# Patient Record
Sex: Male | Born: 1965
Health system: Southern US, Community
[De-identification: ages and names within clinical notes are randomized; demographics above are authoritative.]

## PROBLEM LIST (undated history)

## (undated) DIAGNOSIS — C159 Malignant neoplasm of esophagus, unspecified: Secondary | ICD-10-CM

## (undated) DIAGNOSIS — IMO0001 Reserved for inherently not codable concepts without codable children: Secondary | ICD-10-CM

## (undated) DIAGNOSIS — R7303 Prediabetes: Secondary | ICD-10-CM

## (undated) DIAGNOSIS — Z923 Personal history of irradiation: Secondary | ICD-10-CM

## (undated) DIAGNOSIS — K5790 Diverticulosis of intestine, part unspecified, without perforation or abscess without bleeding: Secondary | ICD-10-CM

## (undated) DIAGNOSIS — G4730 Sleep apnea, unspecified: Secondary | ICD-10-CM

## (undated) DIAGNOSIS — F419 Anxiety disorder, unspecified: Secondary | ICD-10-CM

## (undated) DIAGNOSIS — K219 Gastro-esophageal reflux disease without esophagitis: Secondary | ICD-10-CM

## (undated) DIAGNOSIS — K635 Polyp of colon: Secondary | ICD-10-CM

## (undated) DIAGNOSIS — E785 Hyperlipidemia, unspecified: Secondary | ICD-10-CM

## (undated) HISTORY — PX: OTHER SURGICAL HISTORY: SHX169

## (undated) HISTORY — PX: APPENDECTOMY: SHX54

## (undated) HISTORY — DX: Diverticulosis of intestine, part unspecified, without perforation or abscess without bleeding: K57.90

## (undated) HISTORY — DX: Sleep apnea, unspecified: G47.30

## (undated) HISTORY — PX: LIPOMA EXCISION: SHX5283

## (undated) HISTORY — DX: Polyp of colon: K63.5

## (undated) HISTORY — PX: COLONOSCOPY: SHX174

## (undated) HISTORY — DX: Prediabetes: R73.03

## (undated) HISTORY — DX: Hyperlipidemia, unspecified: E78.5

## (undated) HISTORY — DX: Anxiety disorder, unspecified: F41.9

## (undated) HISTORY — PX: UPPER GASTROINTESTINAL ENDOSCOPY: SHX188

## (undated) HISTORY — DX: Personal history of irradiation: Z92.3

## (undated) SURGERY — EGD (ESOPHAGOGASTRODUODENOSCOPY)
Anesthesia: Moderate Sedation

---

## 2005-04-01 ENCOUNTER — Emergency Department (HOSPITAL_COMMUNITY): Admission: EM | Admit: 2005-04-01 | Discharge: 2005-04-01 | Payer: Self-pay | Admitting: Emergency Medicine

## 2012-07-25 ENCOUNTER — Encounter: Payer: Self-pay | Admitting: Internal Medicine

## 2012-09-02 ENCOUNTER — Ambulatory Visit (INDEPENDENT_AMBULATORY_CARE_PROVIDER_SITE_OTHER): Payer: BC Managed Care – PPO | Admitting: Internal Medicine

## 2012-09-02 ENCOUNTER — Encounter: Payer: Self-pay | Admitting: Internal Medicine

## 2012-09-02 ENCOUNTER — Telehealth: Payer: Self-pay

## 2012-09-02 VITALS — BP 128/92 | HR 80 | Ht 78.0 in | Wt 257.2 lb

## 2012-09-02 DIAGNOSIS — Z800 Family history of malignant neoplasm of digestive organs: Secondary | ICD-10-CM

## 2012-09-02 DIAGNOSIS — E119 Type 2 diabetes mellitus without complications: Secondary | ICD-10-CM

## 2012-09-02 DIAGNOSIS — L290 Pruritus ani: Secondary | ICD-10-CM

## 2012-09-02 MED ORDER — MOVIPREP 100 G PO SOLR: 1.0000 | Freq: Once | ORAL | Status: DC

## 2012-09-02 NOTE — Patient Instructions (Addendum)
You have been scheduled for a colonoscopy with propofol. Please follow written instructions given to you at your visit today.  Please pick up your prep kit at the pharmacy within the next 1-3 days. If you use inhalers (even only as needed), please bring them with you on the day of your procedure.  

## 2012-09-02 NOTE — Progress Notes (Signed)
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 Smaller was diagnosed with colon cancer in her 51s, and subsequently died from the disease. He reports having had a colonoscopy about 5 years ago. Apparently had a polyp. Outside records have been requested for review. His GI review of systems is remarkable for intestinal gas and rectal irritation or itching. Otherwise negative.  REVIEW OF SYSTEMS:  All non-GI ROS negative   Past Medical History  Diagnosis Date  . Hyperlipemia   . Colon polyp     Past Surgical History  Procedure Date  . Appendectomy     Social History Tyrion Glaude  reports that he has never smoked. He has never used smokeless tobacco. He reports that he does not drink alcohol or use illicit drugs.  family history includes Colon cancer in his maternal aunt and mother and Diabetes in his maternal aunt and mother.  No Known Allergies     PHYSICAL EXAMINATION: Vital signs: BP 128/92  Pulse 80  Ht 6\' 6"  (1.981 m)  Wt 257 lb 3.2 oz (116.665 kg)  BMI 29.72 kg/m2  Constitutional: generally well-appearing, no acute distress Psychiatric: alert and oriented x3, cooperative Eyes: extraocular movements intact, anicteric, conjunctiva pink Mouth: oral pharynx moist, no lesions Neck: supple no lymphadenopathy Cardiovascular: heart regular rate and rhythm, no murmur Lungs: clear to auscultation bilaterally Abdomen: soft, nontender, nondistended, no obvious ascites, no peritoneal signs, normal bowel sounds, no organomegaly Rectal:deferred until colonoscopy Extremities: no lower extremity edema bilaterally Skin: no lesions on visible extremities Neuro: No focal deficits.   ASSESSMENT:  #1. Family history of colon cancer in first-degree relative (mom) less than age 84 #2. Prior colonoscopy about 5 years ago. Trying to obtain records for review #3. Perirectal itching. Evaluate at  colonoscopy   PLAN:  #1. High-risk screening colonoscopy.The nature of the procedure, as well as the risks, benefits, and alternatives were carefully and thoroughly reviewed with the patient. Ample time for discussion and questions allowed. The patient understood, was satisfied, and agreed to proceed. Movi prep prescribed. The patient instructed on its use. Hold metformin the morning of this procedure until he resumes normal oral intake

## 2012-09-02 NOTE — Telephone Encounter (Signed)
Left message for patient to call me back. 

## 2012-09-06 ENCOUNTER — Telehealth: Payer: Self-pay

## 2012-09-06 NOTE — Telephone Encounter (Signed)
Lmovm on cell; No answer no VM on home phone.

## 2012-09-17 ENCOUNTER — Ambulatory Visit (AMBULATORY_SURGERY_CENTER): Payer: BC Managed Care – PPO | Admitting: Internal Medicine

## 2012-09-17 ENCOUNTER — Encounter: Payer: Self-pay | Admitting: Internal Medicine

## 2012-09-17 VITALS — BP 123/81 | HR 67 | Temp 98.1°F | Resp 25 | Ht 78.0 in | Wt 257.0 lb

## 2012-09-17 DIAGNOSIS — Z800 Family history of malignant neoplasm of digestive organs: Secondary | ICD-10-CM

## 2012-09-17 DIAGNOSIS — D126 Benign neoplasm of colon, unspecified: Secondary | ICD-10-CM

## 2012-09-17 DIAGNOSIS — Z1211 Encounter for screening for malignant neoplasm of colon: Secondary | ICD-10-CM

## 2012-09-17 MED ORDER — SODIUM CHLORIDE 0.9 % IV SOLN: 500.0000 mL | INTRAVENOUS | Status: DC

## 2012-09-17 NOTE — Patient Instructions (Addendum)
YOU HAD AN ENDOSCOPIC PROCEDURE TODAY AT THE [facility]: Refer to the procedure report that was given to you for any specific questions about what was found during the examination.  If the procedure report does not answer your questions, please call your gastroenterologist to clarify.  If you requested that your care partner not be given the details of your procedure findings, then the procedure report has been included in a sealed envelope for you to review at your convenience later.  YOU SHOULD EXPECT: Some feelings of bloating in the abdomen. Passage of more gas than usual.  Walking can help get rid of the air that was put into your GI tract during the procedure and reduce the bloating. If you had a lower endoscopy (such as a colonoscopy or flexible sigmoidoscopy) you may notice spotting of blood in your stool or on the toilet paper. If you underwent a bowel prep for your procedure, then you may not have a normal bowel movement for a few days.  DIET: Your first meal following the procedure should be a light meal and then it is ok to progress to your normal diet.  A half-sandwich or bowl of soup is an example of a good first meal.  Heavy or fried foods are harder to digest and may make you feel nauseous or bloated.  Likewise meals heavy in dairy and vegetables can cause extra gas to form and this can also increase the bloating.  Drink plenty of fluids but you should avoid alcoholic beverages for 24 hours.  ACTIVITY: Your care partner should take you home directly after the procedure.  You should plan to take it easy, moving slowly for the rest of the day.  You can resume normal activity the day after the procedure however you should NOT DRIVE or use heavy machinery for 24 hours (because of the sedation medicines used during the test).    SYMPTOMS TO REPORT IMMEDIATELY: A gastroenterologist can be reached at any hour.  During normal business hours, 8:30 AM to 5:00 PM Monday through Friday,  call [phone].  After hours and on weekends, please call the GI answering service at [phone] who will take a message and have the physician on call contact you.   Following lower endoscopy (colonoscopy or flexible sigmoidoscopy):  Excessive amounts of blood in the stool  Significant tenderness or worsening of abdominal pains  Swelling of the abdomen that is new, acute  Fever of 100F or higher  FOLLOW UP: If any biopsies were taken you will be contacted by phone or by letter within the next 1-3 weeks.  Call your gastroenterologist if you have not heard about the biopsies in 3 weeks.  Our staff will call the home number listed on your records the next business day following your procedure to check on you and address any questions or concerns that you may have at that time regarding the information given to you following your procedure. This is a courtesy call and so if there is no answer at the home number and we have not heard from you through the emergency physician on call, we will assume that you have returned to your regular daily activities without incident.  SIGNATURES/CONFIDENTIALITY: You and/or your care partner have signed paperwork which will be entered into your electronic medical record.  These signatures attest to the fact that that the information above on your After Visit Summary has been reviewed and is understood.  Full responsibility of the confidentiality of this   discharge information lies with you and/or your care-partner.   Thank-you for choosing us for your healthcare needs. 

## 2012-09-17 NOTE — Progress Notes (Addendum)
Patient did not have preoperative order for IV antibiotic SSI prophylaxis. (G8918)  Patient did not experience any of the following events: a burn prior to discharge; a fall within the facility; wrong site/side/patient/procedure/implant event; or a hospital transfer or hospital admission upon discharge from the facility. (G8907)  

## 2012-09-17 NOTE — Op Note (Signed)
[facility] [address] [place], [address]   COLONOSCOPY PROCEDURE REPORT  PATIENT: Joel Osborne, Joel Osborne  MR#: [id number] BIRTHDATE: 04-10-1966 , 45  yrs. old GENDER: Male ENDOSCOPIST: Roxy Cedar, MD REFERRED XB:JYNWGN Eloise Harman, M.D. PROCEDURE DATE:  09/17/2012 PROCEDURE:   Colonoscopy with snare polypectomy    x 1 ASA CLASS:   Class II INDICATIONS:patient's immediate family history of colon cancer (Mom 50's); Reports colonoscopy 5 yrs ago in [place] (unable to locate records) possibly with polyp. MEDICATIONS: MAC sedation, administered by CRNA and propofol (Diprivan) 200mg  IV  DESCRIPTION OF PROCEDURE:   After the risks benefits and alternatives of the procedure were thoroughly explained, informed consent was obtained.  A digital rectal exam revealed no abnormalities of the rectum.   The LB CF-H180AL [id number]  endoscope was introduced through the anus and advanced to the cecum, which was identified by both the appendix and ileocecal valve. No adverse events experienced.   The quality of the prep was adequate, using MoviPrep  The instrument was then slowly withdrawn as the colon was fully examined.      COLON FINDINGS: A diminutive polyp was found in the ascending colon. A polypectomy was performed with a cold snare.  The resection was complete and the polyp tissue was completely retrieved.   The colon was otherwise normal.  There was no diverticulosis, inflammation, other polyps or cancers .  Retroflexed views revealed small internal hemorrhoids. The time to cecum=2 minutes 11 seconds. Withdrawal time=11 minutes 17 seconds.  The scope was withdrawn and the procedure completed. COMPLICATIONS: There were no complications.  ENDOSCOPIC IMPRESSION: 1.   Diminutive polyp was found in the ascending colon; polypectomy was performed with a cold snare 2.   The colon was otherwise normal  RECOMMENDATIONS: 1. Follow up colonoscopy in 5 years (family  history)   eSigned:  Roxy Cedar, MD 09/17/2012 11:06 AM   cc: Jarome Matin, MD and The Patient   PATIENT NAME:  Dandrea, Widdowson MR#: [id number]

## 2012-09-18 ENCOUNTER — Telehealth: Payer: Self-pay

## 2012-09-18 NOTE — Telephone Encounter (Signed)
  Follow up Call-  Call back number 09/17/2012  Post procedure Call Back phone  # [phone]  Permission to leave phone message Yes     Patient questions:  Do you have a fever, pain , or abdominal swelling? no Pain Score  0 *  Have you tolerated food without any problems? yes  Have you been able to return to your normal activities? yes  Do you have any questions about your discharge instructions: Diet   no Medications  no Follow up visit  no  Do you have questions or concerns about your Care? no  Actions: * If pain score is 4 or above: No action needed, pain <4.

## 2012-09-19 ENCOUNTER — Telehealth: Payer: Self-pay

## 2012-09-19 NOTE — Telephone Encounter (Signed)
Called [facility] to see if pt had been a patient there and had a colon.  Patient only remembers having a procedure somewhere on [address]  Medical records was able to find him in the system but could find no procedure.  They directed me to Dr. Danise Edge at another [place] office.  I called there and was told his chart was offsite.  They are going to obtain it and let me know if it contains a procedure.

## 2012-09-23 ENCOUNTER — Encounter: Payer: Self-pay | Admitting: Internal Medicine

## 2012-09-24 ENCOUNTER — Telehealth: Payer: Self-pay

## 2012-09-24 NOTE — Telephone Encounter (Signed)
Converted to phone note.

## 2012-09-24 NOTE — Telephone Encounter (Signed)
Message copied by Jeanine Luz on Tue Sep 24, 2012  2:43 PM ------      Message from: Hilarie Fredrickson      Created: Tue Sep 24, 2012  2:11 PM      Regarding: RE: old colonoscopy report       Thanks. Convert to phone note for the record.            ----- Message -----         From: Jeanine Luz, CMA         Sent: 09/24/2012   8:55 AM           To: Hilarie Fredrickson, MD      Subject: RE: old colonoscopy report                               I am still trying to track down his procedure.  I spoke with [facility] who had no colon report for him but directed me to [facility].  They saw him in the office and recommended a colon if his mother had a hx of colon cancer but they have no record of any procedure ever being done.                          ----- Message -----         From: Hilarie Fredrickson, MD         Sent: 09/18/2012   1:01 PM           To: Jeanine Luz, CMA      Subject: old colonoscopy report                                   Verlon Au, I saw this patient yesterday in the [facility] for his colonoscopy. He told me that his last colonoscopy was performed in [place] on [address]. It would be call gastroenterology. Call there and get his old colonoscopy report and pathology. Thanks

## 2013-02-27 ENCOUNTER — Other Ambulatory Visit: Payer: Self-pay | Admitting: Internal Medicine

## 2013-02-27 DIAGNOSIS — R1310 Dysphagia, unspecified: Secondary | ICD-10-CM

## 2013-03-06 ENCOUNTER — Telehealth: Payer: Self-pay | Admitting: Internal Medicine

## 2013-03-06 ENCOUNTER — Ambulatory Visit
Admission: RE | Admit: 2013-03-06 | Discharge: 2013-03-06 | Disposition: A | Discharge status: Home / Self Care | Payer: BC Managed Care – PPO | Source: Ambulatory Visit | Attending: Internal Medicine | Admitting: Internal Medicine

## 2013-03-06 ENCOUNTER — Other Ambulatory Visit: Payer: Self-pay | Admitting: Internal Medicine

## 2013-03-06 DIAGNOSIS — R1310 Dysphagia, unspecified: Secondary | ICD-10-CM

## 2013-03-06 NOTE — Telephone Encounter (Signed)
Pt cannot eat and can only get in small amount of liquids. Pt scheduled to see Doug Sou PA tomorrow at 11:15am. Malachi Bonds to notify pt of appt date and time.

## 2013-03-07 ENCOUNTER — Telehealth: Payer: Self-pay | Admitting: *Deleted

## 2013-03-07 ENCOUNTER — Encounter: Payer: Self-pay | Admitting: Internal Medicine

## 2013-03-07 ENCOUNTER — Ambulatory Visit (INDEPENDENT_AMBULATORY_CARE_PROVIDER_SITE_OTHER): Payer: BC Managed Care – PPO | Admitting: Gastroenterology

## 2013-03-07 ENCOUNTER — Encounter: Payer: Self-pay | Admitting: Gastroenterology

## 2013-03-07 VITALS — BP 104/78 | HR 80 | Ht 72.5 in | Wt 235.0 lb

## 2013-03-07 DIAGNOSIS — R1310 Dysphagia, unspecified: Secondary | ICD-10-CM

## 2013-03-07 DIAGNOSIS — R933 Abnormal findings on diagnostic imaging of other parts of digestive tract: Secondary | ICD-10-CM

## 2013-03-07 DIAGNOSIS — R1314 Dysphagia, pharyngoesophageal phase: Secondary | ICD-10-CM

## 2013-03-07 NOTE — Progress Notes (Signed)
03/07/2013 Joel Osborne [id number] 09/02/1966   History of Present Illness: This is a 47 year old male who is a patient of Dr. Lamar Sprinkles.  He presents to our office today with complaints of progressive dysphagia over the past couple of months.  Says that it initially began with some problems with solid food, but sometimes is even having issues getting liquid down at times.  He does not have any significant past history of extensive issues with heartburn/reflux.  Never had problems swallowing in the past.  Has a maternal aunt who had esophageal cancer two years ago.    He saw his PCP who ordered an esophagram (performed yesterday), which showed "Large filling defect in the distal esophagus. Smaller persistent filling defect more proximally in the mid to distal esophagus.  Cannot exclude neoplasm.  Recommend further evaluation with endoscopy."  He said that last night after trying to eat some dinner was the first time that he vomiting to get relief.  No dark or bloody stools.   Current Medications, Allergies, Past Medical History, Past Surgical History, Family History and Social History were reviewed in [employer] record.   Physical Exam: BP 104/78  Pulse 80  Ht 6' 0.5" (1.842 m)  Wt 235 lb (106.595 kg)  BMI 31.42 kg/m2 General: Well developed, male in no acute distress Head: Normocephalic and atraumatic Eyes:  sclerae anicteric, conjunctiva pink  Ears: Normal auditory acuity Lungs: Clear throughout to auscultation Heart: Regular rate and rhythm Abdomen: Soft, non-tender and non-distended. No masses, no hepatomegaly. Normal bowel sounds. Musculoskeletal: Symmetrical with no gross deformities  Extremities: No edema  Neurological: Alert oriented x 4, grossly nonfocal Psychological:  Alert and cooperative. Normal mood and affect  Assessment and Recommendations: -Dysphagia:  Progressive over to two months, initially with solids and now with liquids as  well. -Abnormal esophagram  *Will schedule EGD at the beginning of next week for further evaluation.  The risks, benefits, and alternatives were discussed with the patient and he consents to proceed.

## 2013-03-07 NOTE — Patient Instructions (Addendum)
You have been scheduled for an endoscopy with propofol. Please follow written instructions given to you at your visit today. If you use inhalers (even only as needed), please bring them with you on the day of your procedure. 

## 2013-03-07 NOTE — Telephone Encounter (Signed)
The patient had called in while I was at lunch.  He cancelled the EGD for Mon 03-10-2013 with Dr. Leone Payor due to his wife, attorney at [employer] office.  She has closings on Mon the 31st and cannot come with her husband.  I offered him Wed the Dr. Marina Goodell at 4 PM.  He put me on hold and said that wouldn't do either.  He kept putting me on hold to talk to his wife on the phone.  I urged him to try to take the appt on Wed 4-2.  He said they do not have anyone to pick up the children from school.  He asked about the next week. I told him we had Tues 03-18-2013 at 10:30 .  He said they would take that appointment. I went over the instructions with him.  I advised Doug Sou PA-C of this conversation.

## 2013-03-10 ENCOUNTER — Encounter: Payer: BC Managed Care – PPO | Admitting: Internal Medicine

## 2013-03-12 ENCOUNTER — Encounter: Payer: BC Managed Care – PPO | Admitting: Internal Medicine

## 2013-03-12 NOTE — Progress Notes (Signed)
Noted  

## 2013-03-12 NOTE — Progress Notes (Signed)
Agree with management He was offered an EGD sooner than Dr. Marina Goodell had available but is now scheduled for April 8 w/ Dr. Marina Goodell.  Iva Boop, MD, Clement[facility] Graham

## 2013-03-18 ENCOUNTER — Telehealth: Payer: Self-pay

## 2013-03-18 ENCOUNTER — Encounter: Payer: Self-pay | Admitting: Internal Medicine

## 2013-03-18 ENCOUNTER — Other Ambulatory Visit: Payer: Self-pay | Admitting: Internal Medicine

## 2013-03-18 ENCOUNTER — Ambulatory Visit (AMBULATORY_SURGERY_CENTER): Payer: BC Managed Care – PPO | Admitting: Internal Medicine

## 2013-03-18 VITALS — BP 129/78 | HR 77 | Temp 98.0°F | Resp 18 | Ht 72.0 in | Wt 235.0 lb

## 2013-03-18 DIAGNOSIS — K228 Other specified diseases of esophagus: Secondary | ICD-10-CM

## 2013-03-18 DIAGNOSIS — C160 Malignant neoplasm of cardia: Secondary | ICD-10-CM

## 2013-03-18 DIAGNOSIS — C155 Malignant neoplasm of lower third of esophagus: Secondary | ICD-10-CM

## 2013-03-18 DIAGNOSIS — R933 Abnormal findings on diagnostic imaging of other parts of digestive tract: Secondary | ICD-10-CM

## 2013-03-18 DIAGNOSIS — C159 Malignant neoplasm of esophagus, unspecified: Secondary | ICD-10-CM

## 2013-03-18 DIAGNOSIS — R1314 Dysphagia, pharyngoesophageal phase: Secondary | ICD-10-CM

## 2013-03-18 HISTORY — DX: Malignant neoplasm of esophagus, unspecified: C15.9

## 2013-03-18 HISTORY — PX: OTHER SURGICAL HISTORY: SHX169

## 2013-03-18 MED ORDER — SODIUM CHLORIDE 0.9 % IV SOLN: 500.0000 mL | INTRAVENOUS | Status: DC

## 2013-03-18 NOTE — Patient Instructions (Addendum)
Ct of scan of the chest, abdomen, and pelvis.  Referral to Dr. Truett Perna oncology.  Dr. Lamar Sprinkles 3rd floor nurse will call with appoints and directions.  Continue current medications today.  Please call if any questions or concerns.    YOU HAD AN ENDOSCOPIC PROCEDURE TODAY AT THE [facility]: Refer to the procedure report that was given to you for any specific questions about what was found during the examination.  If the procedure report does not answer your questions, please call your gastroenterologist to clarify.  If you requested that your care partner not be given the details of your procedure findings, then the procedure report has been included in a sealed envelope for you to review at your convenience later.  YOU SHOULD EXPECT: Some feelings of bloating in the abdomen. Passage of more gas than usual.  Walking can help get rid of the air that was put into your GI tract during the procedure and reduce the bloating. If you had a lower endoscopy (such as a colonoscopy or flexible sigmoidoscopy) you may notice spotting of blood in your stool or on the toilet paper. If you underwent a bowel prep for your procedure, then you may not have a normal bowel movement for a few days.  DIET: Your first meal following the procedure should be a light meal and then it is ok to progress to your normal diet.  A half-sandwich or bowl of soup is an example of a good first meal.  Heavy or fried foods are harder to digest and may make you feel nauseous or bloated.  Likewise meals heavy in dairy and vegetables can cause extra gas to form and this can also increase the bloating.  Drink plenty of fluids but you should avoid alcoholic beverages for 24 hours.  ACTIVITY: Your care partner should take you home directly after the procedure.  You should plan to take it easy, moving slowly for the rest of the day.  You can resume normal activity the day after the procedure however you should NOT DRIVE or use heavy  machinery for 24 hours (because of the sedation medicines used during the test).    SYMPTOMS TO REPORT IMMEDIATELY: A gastroenterologist can be reached at any hour.  During normal business hours, 8:30 AM to 5:00 PM Monday through Friday, call [phone].  After hours and on weekends, please call the GI answering service at [phone] who will take a message and have the physician on call contact you.     Following upper endoscopy (EGD)  Vomiting of blood or coffee ground material  New chest pain or pain under the shoulder blades  Painful or persistently difficult swallowing  New shortness of breath  Fever of 100F or higher  Black, tarry-looking stools  FOLLOW UP: If any biopsies were taken you will be contacted by phone or by letter within the next 1-3 weeks.  Call your gastroenterologist if you have not heard about the biopsies in 3 weeks.  Our staff will call the home number listed on your records the next business day following your procedure to check on you and address any questions or concerns that you may have at that time regarding the information given to you following your procedure. This is a courtesy call and so if there is no answer at the home number and we have not heard from you through the emergency physician on call, we will assume that you have returned to your regular daily activities without incident.  SIGNATURES/CONFIDENTIALITY: You and/or your care partner have signed paperwork which will be entered into your electronic medical record.  These signatures attest to the fact that that the information above on your After Visit Summary has been reviewed and is understood.  Full responsibility of the confidentiality of this discharge information lies with you and/or your care-partner.

## 2013-03-18 NOTE — Progress Notes (Signed)
nO COMPLAINTS NOTED IN THE RECOVERY ROOM.  The pt was tearful after speaking with Dr. Marina Goodell about the findings of the exam.  I pulled the curtine and gave the pt and his wife some privacy.  Maw  Patient did not experience any of the following events: a burn prior to discharge; a fall within the facility; wrong site/side/patient/procedure/implant event; or a hospital transfer or hospital admission upon discharge from the facility. (G8907)Patient did not have preoperative order for IV antibiotic SSI prophylaxis. [phone])

## 2013-03-18 NOTE — Progress Notes (Signed)
Called to room to assist during endoscopic procedure.  Patient ID and intended procedure confirmed with present staff. Received instructions for my participation in the procedure from the performing physician. ewm 

## 2013-03-18 NOTE — Telephone Encounter (Signed)
Pt scheduled for CT of CAP with contrast at [facility] CT 03/20/13. Pt to be NPO after 6:30am. Pt to drink 1st bottle of contrast at 8:30am and second bottle at 9:30am. Pt to arrive there at 10:30am. Referral made to [facility] to see Dr. Truett Perna. Pt aware.

## 2013-03-18 NOTE — Op Note (Signed)
[facility] [address] [place], [address]   ENDOSCOPY PROCEDURE REPORT  PATIENT: Joel, Osborne  MR#: [id number] BIRTHDATE: Jul 10, 1966 , 46  yrs. old GENDER: Male ENDOSCOPIST: Roxy Cedar, MD REFERRED BY:  Jarome Matin, M.D. PROCEDURE DATE:  03/18/2013 PROCEDURE:  EGD w/ biopsy ASA CLASS:     Class II INDICATIONS:  Dysphagia.  Abnormal barium swallow MEDICATIONS: MAC sedation, administered by CRNA and propofol (Diprivan) 350mg  IV TOPICAL ANESTHETIC: Cetacaine Spray  DESCRIPTION OF PROCEDURE: After the risks benefits and alternatives of the procedure were thoroughly explained, informed consent was obtained.  The LB GIF-H180 [id number] endoscope was introduced through the mouth and advanced to the second portion of the duodenum. Without limitations.  The instrument was slowly withdrawn as the mucosa was fully examined.      EXAM:The proximal esophagus was normal.  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 This was obstructive.  Multiple biopsies taken.  On retroflex view, the stomach revealed the ulcerative mass in the cardia.  As well, a hiatal hernia.  The remainder of the stomach was normal.  The duodenal bulb and post bulbar duodenum were normal.          The scope was then withdrawn from the patient and the procedure completed.  COMPLICATIONS: There were no complications. ENDOSCOPIC IMPRESSION: 1. 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 Otherwise normal exam.  RECOMMENDATIONS: 1. Schedule contrast-enhanced CT Scan of the chest, abdomen, and pelvis "esophageal mass, rule out metastasis" 2. Followup biopsies 3. Oncology referral, Dr. Truett Perna  REPEAT EXAM:  eSigned:  Roxy Cedar, MD 03/18/2013 11:29 AM   HQ:[employer] Eloise Harman, MD and The Patient

## 2013-03-19 ENCOUNTER — Telehealth: Payer: Self-pay | Admitting: *Deleted

## 2013-03-19 ENCOUNTER — Telehealth: Payer: Self-pay | Admitting: Oncology

## 2013-03-19 NOTE — Telephone Encounter (Signed)
Spoke with patient's wife and confirmed appointment with Dr. Truett Perna for 03/25/13.  Contact names and phone numbers were provided.  She was without any questions.

## 2013-03-19 NOTE — Telephone Encounter (Signed)
Referral to Dr. Truett Perna received.  Will contact patient soon with appointment per Dr. Truett Perna.

## 2013-03-19 NOTE — Telephone Encounter (Signed)
np appt with Dr. Truett Perna 03/25/13@3 :30 Mailed np packet

## 2013-03-19 NOTE — Telephone Encounter (Signed)
No answer, left message with spouse.  She states he is doing well and back at work.  Encouraged her to let him know we called from office and to call if questions or concerns/

## 2013-03-20 ENCOUNTER — Telehealth: Payer: Self-pay

## 2013-03-20 ENCOUNTER — Ambulatory Visit (INDEPENDENT_AMBULATORY_CARE_PROVIDER_SITE_OTHER)
Admission: RE | Admit: 2013-03-20 | Discharge: 2013-03-20 | Disposition: A | Discharge status: Home / Self Care | Payer: BC Managed Care – PPO | Source: Ambulatory Visit | Attending: Internal Medicine | Admitting: Internal Medicine

## 2013-03-20 DIAGNOSIS — K229 Disease of esophagus, unspecified: Secondary | ICD-10-CM

## 2013-03-20 DIAGNOSIS — K228 Other specified diseases of esophagus: Secondary | ICD-10-CM

## 2013-03-20 MED ORDER — IOHEXOL 300 MG/ML  SOLN
100.0000 mL | Freq: Once | INTRAMUSCULAR | Status: AC | PRN
  Administered 2013-03-20: 100 mL via INTRAVENOUS

## 2013-03-20 NOTE — Telephone Encounter (Signed)
Vicie Mutters called and Dr. Truett Perna wanted to know if Dr. Marina Goodell would go ahead and order an EUS for the pts work-up. Let Almira Coaster know that Dr. Marina Goodell is out of the office until Monday. Pts appt is Tuesday with Dr. Truett Perna. Almira Coaster will let Dr. Truett Perna know and he will most likely order the procedure when he sees the pt on Tuesday.

## 2013-03-21 NOTE — Telephone Encounter (Signed)
(  Dan, SEE NOTE BELOW).This is an extensive lesion (at least T3). ? if EUS  findings would change management. I will forward this to Dr. Christella Hartigan for his review. If he feels that EUS would be helpful, them he and Patty can set it up and notify Dr. Earley Abide.

## 2013-03-21 NOTE — Telephone Encounter (Signed)
Brad, I reviewed CT, EGD.  Pretty suspicious for regional adenopathy on CT.  If you think that EUS would be helpful for another TN evaluation, I am happy to.  Let me know.

## 2013-03-25 ENCOUNTER — Encounter: Payer: Self-pay | Admitting: Radiation Oncology

## 2013-03-25 ENCOUNTER — Ambulatory Visit: Payer: BC Managed Care – PPO

## 2013-03-25 ENCOUNTER — Ambulatory Visit (HOSPITAL_BASED_OUTPATIENT_CLINIC_OR_DEPARTMENT_OTHER): Payer: BC Managed Care – PPO | Admitting: Oncology

## 2013-03-25 ENCOUNTER — Other Ambulatory Visit: Payer: Self-pay | Admitting: *Deleted

## 2013-03-25 ENCOUNTER — Encounter: Payer: Self-pay | Admitting: Oncology

## 2013-03-25 VITALS — BP 143/79 | HR 85 | Temp 98.7°F | Resp 18 | Ht 72.0 in | Wt 233.4 lb

## 2013-03-25 DIAGNOSIS — C155 Malignant neoplasm of lower third of esophagus: Secondary | ICD-10-CM

## 2013-03-25 DIAGNOSIS — C159 Malignant neoplasm of esophagus, unspecified: Secondary | ICD-10-CM

## 2013-03-25 NOTE — Progress Notes (Signed)
Request for HER-2/NEU ordered, Accession: [id number] per request of Dr. Truett Perna.  Spoke with [employer].  This RN also spoke with Alycia Rossetti at Dr. Dennie Maizes office and referred to [facility] per request of Dr. Truett Perna.  Tyrone Sage was made aware of patient's case to be presented at 03/26/13 cancer conference.  Referral made to dietician, Vernell Leep.  Patient was given educational information on esophageal cancer along with contact names, phone numbers, and [facility] resources.  Patient informed of PET scan for 03/27/13.  Instructions per [place] in radiology were given to patient.  Patient verbalized comprehension.

## 2013-03-25 NOTE — Progress Notes (Signed)
[facility] New Patient Consult   Referring MD: Jonta Gastineau Petrovic 46 y.o.  Sep 19, 1966    Reason for Referral: Esophagus cancer     HPI: He reports a 3 to four-month history of solid dysphagia. He began an intentional weight loss program at Thanksgiving of last year and has lost greater than 30 pounds. He continues to lose weight, but the pace of weight loss has slowed. He saw Dr. Jarold Motto and an esophagram on 03/06/2013 revealed a large irregular filling defect within the distal esophagus a second smaller persistent filling defect was noted in the mid to distal thoracic esophagus below the level of the carina.  He was referred to Dr. Marina Goodell to evaluate the dysphagia and barium swallow findings. An upper endoscopy on 03/18/2013 revealed a bulky friable mass at the distal 6 cm of the esophagus and involving a gastric cardia. The mass was obstructive. Retroflexed view in the stomach revealed the ulcerative mass in the cardia. The remainder of the stomach was normal. The duodenal bulb and postbulbar duodenum are normal. A biopsy confirmed adenocarcinoma. He was referred for staging CTs of the chest, abdomen, and pelvis on 03/20/2013. A lower right paratracheal node measured 11 mm. A high right paratracheal node measured 8 mm. Small left periaortic nodes were also seen. No evidence of pulmonary metastases. An irregular mass was noted at the distal esophagus. 9 mm lymph node adjacent to the distal esophagus. No significant adenopathy in the gastrohepatic ligament. Small periportal nodes were felt to be benign. A low-density lesion in the inferior right liver measured 15 mm and had peripheral enhancement.  He reports persistent solid dysphagia. He is tolerating liquids.  Past Medical History  Diagnosis Date  . Hyperlipemia   . Colon polyp-tubular adenoma   09/17/2012   . Pre-diabetes     Past Surgical History  Procedure Laterality Date  . Appendectomy    . Lipoma  excision    . Edg  03/18/2013    Esophageal Mass    Family History  Problem Relation Age of Onset  . Colon cancer Mother   . Diabetes Mother   . Breast cancer Maternal Aunt   . Diabetes Maternal Aunt   . Esophageal cancer Maternal Aunt   No other family history of cancer. His brother had a renal transplant  Current outpatient prescriptions:atorvastatin (LIPITOR) 20 MG tablet, , Disp: , Rfl: ;  Canagliflozin (INVOKANA PO), Take 1 tablet by mouth daily., Disp: , Rfl: ;  Multiple Vitamins-Minerals (MULTIVITAMIN WITH MINERALS) tablet, Take 1 tablet by mouth daily., Disp: , Rfl:   Allergies: No Known Allergies  Social History: He is a Land. He lives in [place]. He does not use cigarettes. He reports social alcohol use. No transfusion history. No risk factor for HIV or hepatitis.    History  Alcohol Use  . 1.2 oz/week  . 2 Cans of beer per week    History  Smoking status  . Never Smoker   Smokeless tobacco  . Never Used     ROS:   Positives include: Greater than 30 pound weight loss, anorexia, "snoring "has improved with weight loss, 2 episodes of emesis since the onset of solid dysphagia, coughing after eating  A complete ROS was otherwise negative.  Physical Exam:  Blood pressure 143/79, pulse 85, temperature 98.7 F (37.1 C), temperature source Oral, resp. rate 18, height 6' (1.829 m), weight 233 lb 6.4 oz (105.87 kg).  HEENT: Oropharynx without visible mass, neck without mass  Lungs: Clear bilaterally Cardiac: Regular rate and rhythm Abdomen: No hepatosplenomegaly, nontender, no mass GU: Testes without mass  Vascular: No leg edema Lymph nodes: No cervical, supraclavicular, axillary, or inguinal nodes Neurologic: Alert and oriented. The motor exam appears grossly intact in the upper and lower extremities Skin: No rash   LAB:    Radiology: As per history of present illness. I reviewed the CT scans from 03/20/2013 with Dr.Gruwell and his  wife   Assessment/Plan:   1. Adenocarcinoma of the distal esophagus/gastric cardia-status post an endoscopic biopsy on 03/18/2013 confirming adenocarcinoma -Staging CT scans 03/20/2013 confirmed a distal esophageal/gastric cardia mass, distal paraesophageal lymph nodes and paratracheal nodes concerning for metastatic lymphadenopathy  2. Indeterminate 15 mm right liver lesion on the CT scan 03/20/2013  3. Anorexia/weight loss and solid dysphagia secondary to #1  4. History of a colon polyp, status post a polypectomy October 2013   Disposition:   He has been diagnosed with adenocarcinoma of the gastroesophageal junction. I discussed the diagnosis, prognosis, and treatment options with Dr.Yeates and his wife. I explained the role for multidisciplinary care. He appears to have potentially curable disease based on the staging evaluation to date. He will be referred for a staging PET scan within the next few days. His case will be presented at the GI tumor conference on 03/26/2013. We will ask Dr. Tyrone Sage to decide on the indication for an endoscopic ultrasound. Further imaging of the liver may be required.  If the staging evaluation confirms locally advanced disease then I will recommend neoadjuvant chemotherapy/radiation to be followed by surgery. There is no clear best "standard" neoadjuvant approach in this setting. The tumor is behaving most like a distal esophagus cancer. I will therefore recommend weekly Taxol/carboplatin chemotherapy and concurrent radiation.  We reviewed the potential toxicities associated with this chemotherapy regimen including the chance for nausea/vomiting, mucositis, diarrhea, hematologic toxicity, and alopecia. We discussed the allergic reaction, bone pain, and neuropathy associated with Taxol. We discussed the potential for an allergic reaction with carboplatin. He will attend a chemotherapy teaching class.  We also discussed the chance of developing significant  odynophagia with treatment. He will be scheduled to meet with the cancer Center nutritionist within the next few days.  He is scheduled to see Dr. Mitzi Hansen on 03/26/2013. We have scheduled a first cycle of chemotherapy to be given on 04/08/2012. He will be seen for an office visit on that day.  Approximately 60 minutes were spent with patient today. The majority of time was spent in counseling/coordination of care.  Eilah Common 03/25/2013, 6:38 PM

## 2013-03-25 NOTE — Progress Notes (Signed)
Checked in new pt with no financial concerns. °

## 2013-03-26 ENCOUNTER — Telehealth: Payer: Self-pay | Admitting: Oncology

## 2013-03-26 ENCOUNTER — Ambulatory Visit
Admission: RE | Admit: 2013-03-26 | Discharge: 2013-03-26 | Disposition: A | Discharge status: Home / Self Care | Payer: BC Managed Care – PPO | Source: Ambulatory Visit | Attending: Radiation Oncology | Admitting: Radiation Oncology

## 2013-03-26 ENCOUNTER — Encounter: Payer: Self-pay | Admitting: Radiation Oncology

## 2013-03-26 ENCOUNTER — Telehealth: Payer: Self-pay | Admitting: *Deleted

## 2013-03-26 VITALS — BP 124/73 | HR 82 | Temp 98.7°F | Ht 73.0 in | Wt 233.0 lb

## 2013-03-26 DIAGNOSIS — R5381 Other malaise: Secondary | ICD-10-CM | POA: Insufficient documentation

## 2013-03-26 DIAGNOSIS — Z79899 Other long term (current) drug therapy: Secondary | ICD-10-CM | POA: Insufficient documentation

## 2013-03-26 DIAGNOSIS — R5383 Other fatigue: Secondary | ICD-10-CM | POA: Insufficient documentation

## 2013-03-26 DIAGNOSIS — E785 Hyperlipidemia, unspecified: Secondary | ICD-10-CM | POA: Insufficient documentation

## 2013-03-26 DIAGNOSIS — K219 Gastro-esophageal reflux disease without esophagitis: Secondary | ICD-10-CM | POA: Insufficient documentation

## 2013-03-26 DIAGNOSIS — C155 Malignant neoplasm of lower third of esophagus: Secondary | ICD-10-CM | POA: Insufficient documentation

## 2013-03-26 DIAGNOSIS — C159 Malignant neoplasm of esophagus, unspecified: Secondary | ICD-10-CM

## 2013-03-26 DIAGNOSIS — K209 Esophagitis, unspecified without bleeding: Secondary | ICD-10-CM | POA: Insufficient documentation

## 2013-03-26 DIAGNOSIS — Z803 Family history of malignant neoplasm of breast: Secondary | ICD-10-CM | POA: Insufficient documentation

## 2013-03-26 DIAGNOSIS — Z800 Family history of malignant neoplasm of digestive organs: Secondary | ICD-10-CM | POA: Insufficient documentation

## 2013-03-26 DIAGNOSIS — Z510 Encounter for antineoplastic radiation therapy: Secondary | ICD-10-CM | POA: Insufficient documentation

## 2013-03-26 HISTORY — DX: Malignant neoplasm of esophagus, unspecified: C15.9

## 2013-03-26 HISTORY — DX: Gastro-esophageal reflux disease without esophagitis: K21.9

## 2013-03-26 NOTE — Progress Notes (Signed)
Please see the Nurse Progress Note in the MD Initial Consult Encounter for this patient. 

## 2013-03-26 NOTE — Progress Notes (Signed)
GI Location of Tumor / Histology:Adenocarcinoma of distal esophagus  Patient presented 3 months ago with symptoms JX:[id number] of acid reflux  Biopsies of esophagus 03/18/2013  Past/Anticipated interventions by surgeon, if [id number] ct scan and pet scan  Past/Anticipated interventions by medical oncology, if any:   Weight changes, if any:   Bowel/Bladder complaints, if any: none  Nausea / Vomiting, if any: none  Pain issues, if any:  Discomfort on swallowing  SAFETY ISSUES:  Prior radiation? no  Pacemaker/ICD? no  Possible current pregnancy? no  Is the patient on methotrexate? no  Current Complaints / other details:  Main concern is pain on swallowing and feeling of food getting stuck causing cough.States symptoms started around Christmas but just related to over indulging. 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

## 2013-03-26 NOTE — Telephone Encounter (Signed)
Per staff phone call and POF I have schedueld appts.  JMW  

## 2013-03-27 ENCOUNTER — Other Ambulatory Visit: Payer: BC Managed Care – PPO

## 2013-03-27 ENCOUNTER — Ambulatory Visit (HOSPITAL_COMMUNITY)
Admission: RE | Admit: 2013-03-27 | Discharge: 2013-03-27 | Disposition: A | Discharge status: Home / Self Care | Payer: BC Managed Care – PPO | Source: Ambulatory Visit | Attending: Oncology | Admitting: Oncology

## 2013-03-27 ENCOUNTER — Telehealth: Payer: Self-pay | Admitting: Oncology

## 2013-03-27 DIAGNOSIS — C159 Malignant neoplasm of esophagus, unspecified: Secondary | ICD-10-CM

## 2013-03-27 DIAGNOSIS — C155 Malignant neoplasm of lower third of esophagus: Secondary | ICD-10-CM | POA: Insufficient documentation

## 2013-03-27 MED ORDER — FLUDEOXYGLUCOSE F - 18 (FDG) INJECTION
18.0000 | Freq: Once | INTRAVENOUS | Status: AC | PRN
  Administered 2013-03-27: 18 via INTRAVENOUS

## 2013-03-28 DIAGNOSIS — C155 Malignant neoplasm of lower third of esophagus: Secondary | ICD-10-CM | POA: Insufficient documentation

## 2013-03-28 NOTE — Addendum Note (Signed)
Encounter addended by: Delynn Flavin, RN on: 03/28/2013  7:21 PM<BR>     Documentation filed: Charges VN

## 2013-03-28 NOTE — Progress Notes (Addendum)
Radiation Oncology         [phone] ________________________________  Name: Joel Osborne MRN: [id number]  Date: 03/26/2013  DOB: January 29, 1966  WU:[id number] [name], MD  Ladene Artist, MD     REFERRING PHYSICIAN: Ladene Artist, MD   DIAGNOSIS: Esophageal cancer  HISTORY OF PRESENT ILLNESS::Joel Osborne is a 47 y.o. male who is seen for an initial consultation visit. The patient reports a 3 to 4 month history of some solid dysphasia. He actually states that he began trying to lose weight around Thanksgiving and since that time he has lost approximately 30 pounds. He has continued to trend down in terms of his weight loss. The patient had an esophageal gram on 03/06/2013 which showed an irregular filling defect within the distal esophagus and a smaller defect more proximately.  The patient was referred for further evaluation. An upper endoscopy was performed on 03/18/2013. A bulky, friable mass was present along the distal 6 cm of the esophagus. This was felt to also involve the gastric cardia. This was obstructive and multiple biopsies were obtained. These have returned positive for adenocarcinoma.  The patient has undergone a CT scan of the chest abdomen and pelvis. A PET scan is also pending. On the CT scan, paratracheal mediastinal lymph nodes were noted on the left and the right which were somewhat small but still concerning for possible metastasis. The distal esophagus was thickened as was the proximal gastric cardia as well. No evidence of. Portal metastasis. A small hypodense lesion was seen in the right hepatic lobe which could not be fully characterize. These findings were discussed in multidisciplinary GI conference this morning and an MRI scan was recommended for further evaluation of this. Again, a PET scan is also pending.    PREVIOUS RADIATION THERAPY: No   PAST MEDICAL HISTORY:  has a past medical history of Hyperlipemia; Colon polyp; Pre-diabetes; Esophageal  cancer (03/18/2013); and GERD (gastroesophageal reflux disease).     PAST SURGICAL HISTORY: Past Surgical History  Procedure Laterality Date  . Appendectomy    . Lipoma excision    . Edg  03/18/2013    Esophageal Mass     FAMILY HISTORY: family history includes Breast cancer in his maternal aunt; Colon cancer in his mother; Diabetes in his maternal aunt and mother; and Esophageal cancer in his maternal aunt.   SOCIAL HISTORY:  reports that he has never smoked. He has never used smokeless tobacco. He reports that he drinks about 1.2 ounces of alcohol per week. He reports that he does not use illicit drugs.   ALLERGIES: Review of patient's allergies indicates no known allergies.   MEDICATIONS:  Current Outpatient Prescriptions  Medication Sig Dispense Refill  . atorvastatin (LIPITOR) 20 MG tablet       . Canagliflozin (INVOKANA PO) Take 1 tablet by mouth daily.      Marland Kitchen ibuprofen (ADVIL,MOTRIN) 200 MG tablet Take 200 mg by mouth every 6 (six) hours as needed for pain.      . Multiple Vitamins-Minerals (MULTIVITAMIN WITH MINERALS) tablet Take 1 tablet by mouth daily.       No current facility-administered medications for this encounter.     REVIEW OF SYSTEMS:  A 15 point review of systems is documented in the electronic medical record. This was obtained by the nursing staff. However, I reviewed this with the patient to discuss relevant findings and make appropriate changes.  Pertinent items are noted in HPI.    PHYSICAL EXAM:  height is  6\' 1"  (1.854 m) and weight is 233 lb (105.688 kg). His temperature is 98.7 F (37.1 C). His blood pressure is 124/73 and his pulse is 82. His oxygen saturation is 97%.   General: Well-developed, in no acute distress HEENT: Normocephalic, atraumatic; oral cavity clear Neck: Supple without any lymphadenopathy Cardiovascular: Regular rate and rhythm Respiratory: Clear to auscultation bilaterally GI: Soft, nontender, normal bowel sounds Extremities: No  edema present Neuro: No focal deficits     LABORATORY DATA:  No results found for this basename: WBC, HGB, HCT, MCV, PLT   No results found for this basename: NA, K, CL, CO2   No results found for this basename: ALT, AST, GGT, ALKPHOS, BILITOT      RADIOGRAPHY: Ct Chest W Contrast  03/20/2013  *RADIOLOGY REPORT*  Clinical Data:  Esophageal mass, rule out metastasis.  Difficulty swallowing.  CT CHEST, ABDOMEN AND PELVIS WITH CONTRAST  Technique:  Multidetector CT imaging of the chest, abdomen and pelvis was performed following the standard protocol during bolus administration of intravenous contrast.  Contrast: OMNIPAQUE IOHEXOL 300 MG/ML  SOLN,  Comparison:  Esophagram 03/06/2013  CT CHEST  Findings:  No axillary or supraclavicular lymphadenopathy.  Right lower paratracheal lymph node measures 11 mm short axis (image 24). High right paratracheal lymph node measures 8 mm (image #11). Small left periaortic lymph nodes are also noted (image 20).  No subcarinal adenopathy.  No pericardial fluid.  Review of the lung parenchyma demonstrates no suspicious pulmonary nodules.  IMPRESSION:  1.  Paratracheal mediastinal  lymph nodes on the left and right while small are concerning for nodal metastasis. 2.  No evidence of pulmonary metastasis.  CT ABDOMEN AND PELVIS  Findings:  There is irregular mass in the distal esophagus measuring 3.3 x 3.0 cm.  There is some thickening within the gastric cardiac region enhancement (image 53).  There is a 9 mm lymph node adjacent to the distal esophagus on the right (image 53).  No significant adenopathy in the gastrohepatic ligament. Small periportal lymph nodes are less than 10 mm and likely benign.  There is a low density lesion in the inferior right hepatic lobe measuring 15 mm (image 67). This has peripheral enhancement.  The gallbladder, pancreas, spleen, adrenal glands show no acute findings.  There are gallstones in the gallbladder.  The duodenum, small bowel,  appendix, and cecum are normal.  The colon and rectosigmoid colon are normal.  Abdominal aorta normal caliber.  No retroperitoneal lymphadenopathy  No free fluid the pelvis.  The prostate gland bladder normal.  No pelvic lymphadenopathy. Review of  bone windows demonstrates no aggressive osseous lesions.  IMPRESSION:  1.  Distal esophageal mass and thickened gastric cardia concerning for primary esophageal/gastric carcinoma. 2.  Small distal para esophageal lymph nodes are concerning for local nodal metastasis. 3.  No evidence of periportal metastasis.  4.  Small hypodense lesion in the right hepatic lobe may represent benign hemangioma but is not fully characterized.  Further characterization could be achieved with contrast MRI or FDG PET scan.  5.  Please see chest section for concern for paratracheal nodal metastasis.   Original Report Authenticated By: Genevive Bi, M.D.    Ct Abdomen Pelvis W Contrast  03/20/2013  *RADIOLOGY REPORT*  Clinical Data:  Esophageal mass, rule out metastasis.  Difficulty swallowing.  CT CHEST, ABDOMEN AND PELVIS WITH CONTRAST  Technique:  Multidetector CT imaging of the chest, abdomen and pelvis was performed following the standard protocol during bolus administration of intravenous  contrast.  Contrast: OMNIPAQUE IOHEXOL 300 MG/ML  SOLN,  Comparison:  Esophagram 03/06/2013  CT CHEST  Findings:  No axillary or supraclavicular lymphadenopathy.  Right lower paratracheal lymph node measures 11 mm short axis (image 24). High right paratracheal lymph node measures 8 mm (image #11). Small left periaortic lymph nodes are also noted (image 20).  No subcarinal adenopathy.  No pericardial fluid.  Review of the lung parenchyma demonstrates no suspicious pulmonary nodules.  IMPRESSION:  1.  Paratracheal mediastinal  lymph nodes on the left and right while small are concerning for nodal metastasis. 2.  No evidence of pulmonary metastasis.  CT ABDOMEN AND PELVIS  Findings:  There is  irregular mass in the distal esophagus measuring 3.3 x 3.0 cm.  There is some thickening within the gastric cardiac region enhancement (image 53).  There is a 9 mm lymph node adjacent to the distal esophagus on the right (image 53).  No significant adenopathy in the gastrohepatic ligament. Small periportal lymph nodes are less than 10 mm and likely benign.  There is a low density lesion in the inferior right hepatic lobe measuring 15 mm (image 67). This has peripheral enhancement.  The gallbladder, pancreas, spleen, adrenal glands show no acute findings.  There are gallstones in the gallbladder.  The duodenum, small bowel, appendix, and cecum are normal.  The colon and rectosigmoid colon are normal.  Abdominal aorta normal caliber.  No retroperitoneal lymphadenopathy  No free fluid the pelvis.  The prostate gland bladder normal.  No pelvic lymphadenopathy. Review of  bone windows demonstrates no aggressive osseous lesions.  IMPRESSION:  1.  Distal esophageal mass and thickened gastric cardia concerning for primary esophageal/gastric carcinoma. 2.  Small distal para esophageal lymph nodes are concerning for local nodal metastasis. 3.  No evidence of periportal metastasis.  4.  Small hypodense lesion in the right hepatic lobe may represent benign hemangioma but is not fully characterized.  Further characterization could be achieved with contrast MRI or FDG PET scan.  5.  Please see chest section for concern for paratracheal nodal metastasis.   Original Report Authenticated By: Genevive Bi, M.D.    Dg Esophagus  03/06/2013  *RADIOLOGY REPORT*  Clinical Data:Dysphagia.  ESOPHAGUS/BARIUM SWALLOW/TABLET STUDY  Fluoroscopy Time: 2.7 minutes.  Comparison: None.  Findings: Double contrast esophagram demonstrates a large irregular filling defect within the distal esophagus.  There is a second smaller persistent filling defect within the mid to distal thoracic esophagus just below the level of the carina.  Findings are  concerning for masses/neoplasm.  Cannot completely exclude a component of retained/impacted food.  Contrast material does pass around this mass-like filling defect.  Normal esophageal motility.  IMPRESSION: Large filling defect in the distal esophagus.  Smaller persistent filling defect more proximally in the mid to distal esophagus. Cannot exclude neoplasm.  Recommend further evaluation with endoscopy.  These results were discussed with Dr. Jarold Motto at the time of interpretation.   Original Report Authenticated By: Charlett Nose, M.D.    Nm Pet Image Initial (pi) Skull Base To Thigh  03/27/2013  *RADIOLOGY REPORT*  Clinical Data: Initial treatment strategy for esophageal cancer.  NUCLEAR MEDICINE PET SKULL BASE TO THIGH  Fasting Blood Glucose:  84  Technique:  18 mCi F-18 FDG was injected intravenously. CT data was obtained and used for attenuation correction and anatomic localization only.  (This was not acquired as a diagnostic CT examination.) Additional exam technical data entered on technologist worksheet.  Comparison:  03/20/2013  Findings:  Neck: No hypermetabolic lymph nodes in the neck.  Chest:  There is a new right paratracheal lymph node which measures 9 mm.  The SUV max associated with this lymph node is not well to 4.8.  Increased FDG uptake associated with the left paratracheal lymph node has an SUV max equal to 6.7, image 79.  Posterior to the left mainstem bronchus is a short segment of intense FDG uptake associated with the esophagus.  The SUV max is equal to 8.9, image 89.   Within the distal portion of the esophagus there is a long segment of abnormal increased FDG uptake which extends into the gastric cardia.  This segment measures approximately 8.5 cm, and has an SUV max equal to 12.3.  At this level there is marked thickening of the esophageal wall.  Abdomen/Pelvis:  No abnormal hypermetabolic activity within the liver, pancreas, adrenal glands, or spleen.  No hypermetabolic lymph nodes in  the abdomen or pelvis. Stones are noted within the dependent portion of the gallbladder.  Skeleton:  No focal hypermetabolic activity to suggest skeletal metastasis.  IMPRESSION:  1.  Multi focal area of hypermetabolic tumor involves the thoracic esophagus. Majority of the tumor involves the distal 8 cm of the esophagus.  2.  Hypermetabolic lymph nodes within the mediastinum which are worrisome for metastatic adenopathy. 3.  No evidence for distant metastatic disease.   Original Report Authenticated By: Signa Kell, M.D.        IMPRESSION:  the patient is presenting with locally advanced adenocarcinoma of the distal esophagus/GE junction. Further staging is warranted to complete his staging with a PET scan pending tomorrow. An MRI scan of the liver is also pending to evaluate one area seen which was potentially suspicious on CT imaging.   PLAN:  The patient's case was discussed in multidisciplinary GI conference patient was felt to be a good candidate for initial chemoradiotherapy followed by possible surgical resection. This plan was discussed with the patient.  I discussed with him a 5-1/2 week course of radiotherapy. We discussed the rationale of this treatment and the expected benefits. We also discussed the potential side effects and risks. All of his questions were answered. We discussed in detail the issue of esophagitis toward treatment.  The patient has been seen by Dr. Truett Perna who is planning to treat the patient with chemoradiotherapy. The anticipated start date is 04/08/2013. The patient will proceed with simulation later today such that we can proceed with treatment planning and we will plan to begin his treatment on this date.  The patient does understand that he had some additional staging studies pending, and it is possible that his overall treatment plan will need to be altered depending on the results.  At conference, it was not felt that endoscopic ultrasound would be necessary for  him given the initial clinical findings, as this would not change his treatment plan.   I spent 60 minutes minutes face to face with the patient and more than 50% of that time was spent in counseling and/or coordination of care.    ________________________________   Radene Gunning, MD, PhD

## 2013-03-31 ENCOUNTER — Ambulatory Visit: Payer: BC Managed Care – PPO | Admitting: Nutrition

## 2013-03-31 ENCOUNTER — Encounter: Payer: BC Managed Care – PPO | Admitting: Nutrition

## 2013-03-31 ENCOUNTER — Other Ambulatory Visit: Payer: BC Managed Care – PPO

## 2013-03-31 ENCOUNTER — Encounter: Payer: Self-pay | Admitting: Nutrition

## 2013-03-31 NOTE — Progress Notes (Signed)
Patient is a 46 year old male diagnosed with esophageal cancer to receive chemoradiation therapy.  Past medical history includes hyperlipidemia, prediabetes, and GERD.  Medications include Lipitor and multivitamin.  Labs were reviewed.  Height: 6 feet 1 inch. Weight: 233 pounds documented April 16. Usual body weight: 257 pounds October 2013. BMI: 30.75.  Patient reports intentional weight loss beginning in November. He lost approximately 30 pounds. He noticed during this time that he began to have difficulty swallowing solid food. He continues to have some solid food dysphasia but tolerates liquids without difficulty. He has no other nutrition side effects at this time.  Nutrition diagnosis: Predicted suboptimal energy intake related to new diagnosis of esophageal cancer and associated treatments as evidenced by history or presence of this condition for which research shows an increased incidence of suboptimal energy intake.  Intervention: I educated patient on consuming increased calories and protein in 6 small meals or snacks daily to promote minimal weight loss. I have reviewed high protein sources with him and encouraged patient to begin an oral nutrition supplement at least once a day. I provided samples of Carnation breakfast essentials, boost, and ensure or for patient to try I have educated him on strategies for altering temperature and taste of foods as well as textures. I provided fact sheets and my contact information and I've answered his questions. Teach back method used.  Monitoring, evaluation, goals: Patient will tolerate adequate calories and protein to minimize weight loss throughout treatment.  Next visit: Tuesday, April 29, during chemotherapy.

## 2013-04-01 ENCOUNTER — Encounter: Payer: Self-pay | Admitting: Cardiothoracic Surgery

## 2013-04-01 ENCOUNTER — Institutional Professional Consult (permissible substitution) (INDEPENDENT_AMBULATORY_CARE_PROVIDER_SITE_OTHER): Payer: BC Managed Care – PPO | Admitting: Cardiothoracic Surgery

## 2013-04-01 VITALS — BP 133/85 | HR 92 | Resp 20 | Ht 72.0 in | Wt 233.0 lb

## 2013-04-01 DIAGNOSIS — C159 Malignant neoplasm of esophagus, unspecified: Secondary | ICD-10-CM

## 2013-04-01 NOTE — Progress Notes (Signed)
[address]            [place] [address]          [id number]      Joel Osborne [facility] Medical Record #[id number] Date of Birth: Dec 22, 1965  Referring: Ladene Artist, MD Primary Care: Garlan Fillers, MD  Chief Complaint:    Chief Complaint  Patient presents with  . Esophageal Cancer    surgical eval, PET Scan 03/27/13    History of Present Illness:    Patient is 47 yo who  began an intentional weight loss program at Thanksgiving of last year and has lost greater than 30 pounds. He now notes   a 3 to four-month history of solid dysphagia.He continues to lose weight. He saw Dr. Jarold Motto and an esophagram on 03/06/2013 revealed a large irregular filling defect within the distal esophagus a second smaller persistent filling defect was noted in the mid to distal thoracic esophagus below the level of the carina.    An upper endoscopy on 03/18/2013 revealed a bulky friable mass at the distal 6 cm of the esophagus and involving a gastric cardia. The mass was obstructive. Retroflexed view in the stomach revealed the ulcerative mass in the cardia. The remainder of the stomach was normal. The duodenal bulb and postbulbar duodenum are normal. 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 Accession: [id number] He was referred for staging CTs of the chest, abdomen, and pelvis on 03/20/2013. A lower right paratracheal node measured 11 mm. A high right paratracheal node measured 8 mm. Small left periaortic nodes were also seen. No evidence of pulmonary metastases. An irregular mass was noted at the distal esophagus. 9 mm lymph node adjacent to the distal esophagus. No significant adenopathy in the gastrohepatic ligament. Small periportal nodes were felt to be benign. A low-density lesion in the inferior right liver measured 15 mm and had peripheral enhancement.  He reports persistent solid dysphagia. He is  tolerating liquids.        Current Activity/ Functional Status:  Patient is independent with mobility/ambulation, transfers, ADL's, IADL's.  Zubrod Score: At the time of surgery this patient's most appropriate activity status/level should be described as: [x]  Normal activity, no symptoms []  Symptoms, fully ambulatory []  Symptoms, in bed less than or equal to 50% of the time []  Symptoms, in bed greater than 50% of the time but less than 100% []  Bedridden []  Moribund   Past Medical History  Diagnosis Date  . Hyperlipemia   . Colon polyp   . Pre-diabetes   . 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 GERD (gastroesophageal reflux disease)     Past Surgical History  Procedure Laterality Date  . Appendectomy    . Lipoma excision    . Edg  03/18/2013    Esophageal Mass    Family History  Problem Relation Age of Onset  . Colon cancer Mother   . Diabetes Mother   . Breast cancer Maternal Aunt   . Diabetes Maternal Aunt   . Esophageal cancer Maternal Aunt     History   Social History  . Marital Status: Married    Spouse Name: N/A    Number of Children: 2  . Years of Education: N/A   Occupational History  . Chiropractor    Social History Main Topics  . Smoking status: Never  Smoker   . Smokeless tobacco: Never Used  . Alcohol Use: 1.2 oz/week    2 Cans of beer per week  . Drug Use: No  . Sexually Active: Not on file   Other Topics Concern  . Not on file   Social History Narrative  . No narrative on file    History  Smoking status  . Never Smoker   Smokeless tobacco  . Never Used    History  Alcohol Use  . 1.2 oz/week  . 2 Cans of beer per week     No Known Allergies  Current Outpatient Prescriptions  Medication Sig Dispense Refill  . atorvastatin (LIPITOR) 20 MG tablet Take 20 mg by mouth daily.       . Canagliflozin (INVOKANA PO) Take 1 tablet by mouth daily.      Marland Kitchen ibuprofen (ADVIL,MOTRIN) 200 MG tablet Take 200 mg by mouth every 6  (six) hours as needed for pain.      . Multiple Vitamins-Minerals (MULTIVITAMIN WITH MINERALS) tablet Take 1 tablet by mouth daily.       No current facility-administered medications for this visit.       Review of Systems:     Cardiac Review of Systems: Y or N  Chest Pain [ n   ]  Resting SOB [  n ] Exertional SOB  [n  ]  Orthopnea [ n ]   Pedal Edema [ n  ]    Palpitations [ n ] Syncope  [ n ]   Presyncope [n   ]  General Review of Systems: [Y] = yes [  ]=no Constitional: recent weight change Cove.Etienne  ]; anorexia [ y ]; fatigue [  ]; nausea [  ]; night sweats [  ]; fever [  ]; or chills [  ];                                                                                                                                          Dental: poor dentition[ n ]; Last Dentist visit:   Eye : blurred vision [  ]; diplopia [   ]; vision changes [  ];  Amaurosis fugax[  ]; Resp: cough [  ];  wheezing[  ];  hemoptysis[  ]; shortness of breath[  ]; paroxysmal nocturnal dyspnea[  ]; dyspnea on exertion[  ]; or orthopnea[  ];  GI:  gallstones[  ], vomiting[  ];  dysphagia[ y ]; melena[ n ];  hematochezia [n  ]; heartburn[n  ];   Hx of  Colonoscopy[y  ]; GU: kidney stones [  ]; hematuria[  ];   dysuria [  ];  nocturia[  ];  history of     obstruction [  ]; urinary frequency [  ]             Skin: rash, swelling[  ];, hair loss[  ];  peripheral edema[  ];  or itching[  ]; Musculosketetal: myalgias[  ];  joint swelling[  ];  joint erythema[  ];  joint pain[  ];  back pain[  ];  Heme/Lymph: bruising[  ];  bleeding[  ];  anemia[  ];  Neuro: TIA[  ];  headaches[  ];  stroke[  ];  vertigo[  ];  seizures[  ];   paresthesias[  ];  difficulty walking[  ];  Psych:depression[  ]; anxiety[  ];  Endocrine: diabetes[  ];  thyroid dysfunction[  ];  Immunizations: Flu [ n ]; Pneumococcal[ n ];  Other:  Physical Exam: BP 133/85  Pulse 92  Resp 20  Ht 6' (1.829 m)  Wt 233 lb (105.688 kg)  BMI 31.59 kg/m2  SpO2  98%  General appearance: alert, cooperative, appears stated age and no distress Neurologic: intact Heart: regular rate and rhythm, S1, S2 normal, no murmur, click, rub or gallop and normal apical impulse Lungs: clear to auscultation bilaterally and normal percussion bilaterally Abdomen: soft, non-tender; bowel sounds normal; no masses,  no organomegaly Extremities: extremities normal, atraumatic, no cyanosis or edema, Homans sign is negative, no sign of DVT, no edema, redness or tenderness in the calves or thighs and no ulcers, gangrene or trophic changes   Diagnostic Studies & Laboratory data:    Ct Chest W Contrast  03/20/2013  *RADIOLOGY REPORT*  Clinical Data:  Esophageal mass, rule out metastasis.  Difficulty swallowing.  CT CHEST, ABDOMEN AND PELVIS WITH CONTRAST  Technique:  Multidetector CT imaging of the chest, abdomen and pelvis was performed following the standard protocol during bolus administration of intravenous contrast.  Contrast: OMNIPAQUE IOHEXOL 300 MG/ML  SOLN,  Comparison:  Esophagram 03/06/2013  CT CHEST  Findings:  No axillary or supraclavicular lymphadenopathy.  Right lower paratracheal lymph node measures 11 mm short axis (image 24). High right paratracheal lymph node measures 8 mm (image #11). Small left periaortic lymph nodes are also noted (image 20).  No subcarinal adenopathy.  No pericardial fluid.  Review of the lung parenchyma demonstrates no suspicious pulmonary nodules.  IMPRESSION:  1.  Paratracheal mediastinal  lymph nodes on the left and right while small are concerning for nodal metastasis. 2.  No evidence of pulmonary metastasis.  CT ABDOMEN AND PELVIS  Findings:  There is irregular mass in the distal esophagus measuring 3.3 x 3.0 cm.  There is some thickening within the gastric cardiac region enhancement (image 53).  There is a 9 mm lymph node adjacent to the distal esophagus on the right (image 53).  No significant adenopathy in the gastrohepatic ligament.  Small periportal lymph nodes are less than 10 mm and likely benign.  There is a low density lesion in the inferior right hepatic lobe measuring 15 mm (image 67). This has peripheral enhancement.  The gallbladder, pancreas, spleen, adrenal glands show no acute findings.  There are gallstones in the gallbladder.  The duodenum, small bowel, appendix, and cecum are normal.  The colon and rectosigmoid colon are normal.  Abdominal aorta normal caliber.  No retroperitoneal lymphadenopathy  No free fluid the pelvis.  The prostate gland bladder normal.  No pelvic lymphadenopathy. Review of  bone windows demonstrates no aggressive osseous lesions.  IMPRESSION:  1.  Distal esophageal mass and thickened gastric cardia concerning for primary esophageal/gastric carcinoma. 2.  Small distal para esophageal lymph nodes are concerning for local nodal metastasis. 3.  No evidence of periportal metastasis.  4.  Small hypodense lesion in the  right hepatic lobe may represent benign hemangioma but is not fully characterized.  Further characterization could be achieved with contrast MRI or FDG PET scan.  5.  Please see chest section for concern for paratracheal nodal metastasis.   Original Report Authenticated By: Genevive Bi, M.D.    Ct Abdomen Pelvis W Contrast  03/20/2013  *RADIOLOGY REPORT*  Clinical Data:  Esophageal mass, rule out metastasis.  Difficulty swallowing.  CT CHEST, ABDOMEN AND PELVIS WITH CONTRAST  Technique:  Multidetector CT imaging of the chest, abdomen and pelvis was performed following the standard protocol during bolus administration of intravenous contrast.  Contrast: OMNIPAQUE IOHEXOL 300 MG/ML  SOLN,  Comparison:  Esophagram 03/06/2013  CT CHEST  Findings:  No axillary or supraclavicular lymphadenopathy.  Right lower paratracheal lymph node measures 11 mm short axis (image 24). High right paratracheal lymph node measures 8 mm (image #11). Small left periaortic lymph nodes are also noted (image 20).   No subcarinal adenopathy.  No pericardial fluid.  Review of the lung parenchyma demonstrates no suspicious pulmonary nodules.  IMPRESSION:  1.  Paratracheal mediastinal  lymph nodes on the left and right while small are concerning for nodal metastasis. 2.  No evidence of pulmonary metastasis.  CT ABDOMEN AND PELVIS  Findings:  There is irregular mass in the distal esophagus measuring 3.3 x 3.0 cm.  There is some thickening within the gastric cardiac region enhancement (image 53).  There is a 9 mm lymph node adjacent to the distal esophagus on the right (image 53).  No significant adenopathy in the gastrohepatic ligament. Small periportal lymph nodes are less than 10 mm and likely benign.  There is a low density lesion in the inferior right hepatic lobe measuring 15 mm (image 67). This has peripheral enhancement.  The gallbladder, pancreas, spleen, adrenal glands show no acute findings.  There are gallstones in the gallbladder.  The duodenum, small bowel, appendix, and cecum are normal.  The colon and rectosigmoid colon are normal.  Abdominal aorta normal caliber.  No retroperitoneal lymphadenopathy  No free fluid the pelvis.  The prostate gland bladder normal.  No pelvic lymphadenopathy. Review of  bone windows demonstrates no aggressive osseous lesions.  IMPRESSION:  1.  Distal esophageal mass and thickened gastric cardia concerning for primary esophageal/gastric carcinoma. 2.  Small distal para esophageal lymph nodes are concerning for local nodal metastasis. 3.  No evidence of periportal metastasis.  4.  Small hypodense lesion in the right hepatic lobe may represent benign hemangioma but is not fully characterized.  Further characterization could be achieved with contrast MRI or FDG PET scan.  5.  Please see chest section for concern for paratracheal nodal metastasis.   Original Report Authenticated By: Genevive Bi, M.D.    Dg Esophagus  03/06/2013  *RADIOLOGY REPORT*  Clinical Data:Dysphagia.   ESOPHAGUS/BARIUM SWALLOW/TABLET STUDY  Fluoroscopy Time: 2.7 minutes.  Comparison: None.  Findings: Double contrast esophagram demonstrates a large irregular filling defect within the distal esophagus.  There is a second smaller persistent filling defect within the mid to distal thoracic esophagus just below the level of the carina.  Findings are concerning for masses/neoplasm.  Cannot completely exclude a component of retained/impacted food.  Contrast material does pass around this mass-like filling defect.  Normal esophageal motility.  IMPRESSION: Large filling defect in the distal esophagus.  Smaller persistent filling defect more proximally in the mid to distal esophagus. Cannot exclude neoplasm.  Recommend further evaluation with endoscopy.  These results were discussed with Dr.  Patterson at the time of interpretation.   Original Report Authenticated By: Charlett Nose, M.D.    Nm Pet Image Initial (pi) Skull Base To Thigh  03/27/2013  *RADIOLOGY REPORT*  Clinical Data: Initial treatment strategy for esophageal cancer.  NUCLEAR MEDICINE PET SKULL BASE TO THIGH  Fasting Blood Glucose:  84  Technique:  18 mCi F-18 FDG was injected intravenously. CT data was obtained and used for attenuation correction and anatomic localization only.  (This was not acquired as a diagnostic CT examination.) Additional exam technical data entered on technologist worksheet.  Comparison:  03/20/2013  Findings:  Neck: No hypermetabolic lymph nodes in the neck.  Chest:  There is a new right paratracheal lymph node which measures 9 mm.  The SUV max associated with this lymph node is not well to 4.8.  Increased FDG uptake associated with the left paratracheal lymph node has an SUV max equal to 6.7, image 79.  Posterior to the left mainstem bronchus is a short segment of intense FDG uptake associated with the esophagus.  The SUV max is equal to 8.9, image 89.   Within the distal portion of the esophagus there is a long segment of abnormal  increased FDG uptake which extends into the gastric cardia.  This segment measures approximately 8.5 cm, and has an SUV max equal to 12.3.  At this level there is marked thickening of the esophageal wall.  Abdomen/Pelvis:  No abnormal hypermetabolic activity within the liver, pancreas, adrenal glands, or spleen.  No hypermetabolic lymph nodes in the abdomen or pelvis. Stones are noted within the dependent portion of the gallbladder.  Skeleton:  No focal hypermetabolic activity to suggest skeletal metastasis.  IMPRESSION:  1.  Multi focal area of hypermetabolic tumor involves the thoracic esophagus. Majority of the tumor involves the distal 8 cm of the esophagus.  2.  Hypermetabolic lymph nodes within the mediastinum which are worrisome for metastatic adenopathy. 3.  No evidence for distant metastatic disease.   Original Report Authenticated By: Signa Kell, M.D.     Recent Lab Findings: No results found for this basename: WBC, HGB, HCT, PLT, GLUCOSE, CHOL, TRIG, HDL, LDLDIRECT, LDLCALC, ALT, AST, NA, K, CL, CREATININE, BUN, CO2, TSH, INR, GLUF, HGBA1C      Assessment / Plan:     Advanced stage adenocarcinoma of distal esophageal, involving proximal stomach, likely nodal involvement. Patient underlying health, he can tolerate esophageal resection.  Agree with radiation and chemotherapy, then restage with ct scan (poss mri of liver ) and esophageal ultrasound   I will plan to see back in early June and consider resection 6 week after completion of chemo and radiation 60 min face to face spent reviewing surgical procedure.     Delight Ovens MD  Beeper [phone] Office [phone] 04/01/2013 10:19 PM

## 2013-04-06 ENCOUNTER — Other Ambulatory Visit: Payer: Self-pay | Admitting: Oncology

## 2013-04-07 ENCOUNTER — Telehealth: Payer: Self-pay | Admitting: Oncology

## 2013-04-07 ENCOUNTER — Telehealth: Payer: Self-pay | Admitting: *Deleted

## 2013-04-07 ENCOUNTER — Ambulatory Visit
Admission: RE | Admit: 2013-04-07 | Discharge: 2013-04-07 | Disposition: A | Discharge status: Home / Self Care | Payer: BC Managed Care – PPO | Source: Ambulatory Visit | Attending: Radiation Oncology | Admitting: Radiation Oncology

## 2013-04-07 ENCOUNTER — Other Ambulatory Visit: Payer: Self-pay | Admitting: *Deleted

## 2013-04-07 DIAGNOSIS — C155 Malignant neoplasm of lower third of esophagus: Secondary | ICD-10-CM

## 2013-04-07 NOTE — Telephone Encounter (Signed)
Asking if he needs driver or someone to stay with him during the infusion tomorrow-1st chemo? Told him he must have someone to drive him home and I strongly encouraged him to have his wife stay with him for at least the first 30 minutes of the Taxol due to potential for hypersensitivity reaction.

## 2013-04-08 ENCOUNTER — Ambulatory Visit (HOSPITAL_BASED_OUTPATIENT_CLINIC_OR_DEPARTMENT_OTHER): Payer: BC Managed Care – PPO

## 2013-04-08 ENCOUNTER — Other Ambulatory Visit (HOSPITAL_BASED_OUTPATIENT_CLINIC_OR_DEPARTMENT_OTHER): Payer: BC Managed Care – PPO | Admitting: Lab

## 2013-04-08 ENCOUNTER — Ambulatory Visit
Admission: RE | Admit: 2013-04-08 | Discharge: 2013-04-08 | Disposition: A | Discharge status: Home / Self Care | Payer: BC Managed Care – PPO | Source: Ambulatory Visit | Attending: Radiation Oncology | Admitting: Radiation Oncology

## 2013-04-08 ENCOUNTER — Telehealth: Payer: Self-pay | Admitting: Oncology

## 2013-04-08 ENCOUNTER — Ambulatory Visit (HOSPITAL_BASED_OUTPATIENT_CLINIC_OR_DEPARTMENT_OTHER): Payer: BC Managed Care – PPO | Admitting: Oncology

## 2013-04-08 ENCOUNTER — Ambulatory Visit: Payer: BC Managed Care – PPO | Admitting: Nutrition

## 2013-04-08 VITALS — BP 139/84 | HR 84 | Temp 98.3°F | Resp 18 | Ht 72.0 in | Wt 227.7 lb

## 2013-04-08 VITALS — BP 120/72 | HR 74 | Temp 98.5°F | Resp 18

## 2013-04-08 DIAGNOSIS — R1319 Other dysphagia: Secondary | ICD-10-CM

## 2013-04-08 DIAGNOSIS — Z5111 Encounter for antineoplastic chemotherapy: Secondary | ICD-10-CM

## 2013-04-08 DIAGNOSIS — C155 Malignant neoplasm of lower third of esophagus: Secondary | ICD-10-CM

## 2013-04-08 DIAGNOSIS — R630 Anorexia: Secondary | ICD-10-CM

## 2013-04-08 DIAGNOSIS — C159 Malignant neoplasm of esophagus, unspecified: Secondary | ICD-10-CM

## 2013-04-08 DIAGNOSIS — K7689 Other specified diseases of liver: Secondary | ICD-10-CM

## 2013-04-08 LAB — CBC WITH DIFFERENTIAL/PLATELET
Eosinophils Absolute: 0.2 10*3/uL (ref 0.0–0.5)
MONO#: 0.6 10*3/uL (ref 0.1–0.9)
NEUT#: 5.1 10*3/uL (ref 1.5–6.5)
Platelets: 246 10*3/uL (ref 140–400)
RBC: 5.58 10*6/uL (ref 4.20–5.82)
RDW: 15.4 % — ABNORMAL HIGH (ref 11.0–14.6)
WBC: 7.1 10*3/uL (ref 4.0–10.3)
lymph#: 1.2 10*3/uL (ref 0.9–3.3)

## 2013-04-08 LAB — COMPREHENSIVE METABOLIC PANEL (CC13)
Albumin: 3.5 g/dL (ref 3.5–5.0)
CO2: 24 mEq/L (ref 22–29)
Glucose: 110 mg/dl — ABNORMAL HIGH (ref 70–99)
Sodium: 138 mEq/L (ref 136–145)
Total Bilirubin: 0.48 mg/dL (ref 0.20–1.20)
Total Protein: 7.4 g/dL (ref 6.4–8.3)

## 2013-04-08 MED ORDER — SODIUM CHLORIDE 0.9 % IV SOLN
Freq: Once | INTRAVENOUS | Status: AC
  Administered 2013-04-08: 12:00:00 via INTRAVENOUS

## 2013-04-08 MED ORDER — SODIUM CHLORIDE 0.9 % IV SOLN
300.0000 mg | Freq: Once | INTRAVENOUS | Status: AC
  Administered 2013-04-08: 300 mg via INTRAVENOUS
  Filled 2013-04-08: qty 30

## 2013-04-08 MED ORDER — PROCHLORPERAZINE MALEATE 10 MG PO TABS: 10.0000 mg | ORAL_TABLET | Freq: Four times a day (QID) | ORAL | Status: DC | PRN

## 2013-04-08 MED ORDER — FAMOTIDINE IN NACL 20-0.9 MG/50ML-% IV SOLN
20.0000 mg | Freq: Once | INTRAVENOUS | Status: AC
  Administered 2013-04-08: 20 mg via INTRAVENOUS

## 2013-04-08 MED ORDER — ONDANSETRON 16 MG/50ML IVPB (CHCC)
16.0000 mg | Freq: Once | INTRAVENOUS | Status: AC
  Administered 2013-04-08: 16 mg via INTRAVENOUS

## 2013-04-08 MED ORDER — HYDROCODONE-ACETAMINOPHEN 7.5-325 MG/15ML PO SOLN: 15.0000 mL | Freq: Four times a day (QID) | ORAL | Status: DC | PRN

## 2013-04-08 MED ORDER — PACLITAXEL CHEMO INJECTION 300 MG/50ML
50.0000 mg/m2 | Freq: Once | INTRAVENOUS | Status: AC
  Administered 2013-04-08: 114 mg via INTRAVENOUS
  Filled 2013-04-08: qty 19

## 2013-04-08 MED ORDER — DIPHENHYDRAMINE HCL 50 MG/ML IJ SOLN
50.0000 mg | Freq: Once | INTRAMUSCULAR | Status: AC
  Administered 2013-04-08: 50 mg via INTRAVENOUS

## 2013-04-08 MED ORDER — DEXAMETHASONE SODIUM PHOSPHATE 4 MG/ML IJ SOLN
20.0000 mg | Freq: Once | INTRAMUSCULAR | Status: AC
  Administered 2013-04-08: 20 mg via INTRAVENOUS

## 2013-04-08 NOTE — Patient Instructions (Addendum)
[facility] Discharge Instructions for Patients Receiving Chemotherapy  Today you received the following chemotherapy agents Taxol and Carboplatin.  To help prevent nausea and vomiting after your treatment, we encourage you to take your nausea medication as prescribed.   If you develop nausea and vomiting that is not controlled by your nausea medication, call the clinic. If it is after clinic hours your family physician or the after hours number for the clinic or go to the Emergency Department.   BELOW ARE SYMPTOMS THAT SHOULD BE REPORTED IMMEDIATELY:  *FEVER GREATER THAN 100.5 F  *CHILLS WITH OR WITHOUT FEVER  NAUSEA AND VOMITING THAT IS NOT CONTROLLED WITH YOUR NAUSEA MEDICATION  *UNUSUAL SHORTNESS OF BREATH  *UNUSUAL BRUISING OR BLEEDING  TENDERNESS IN MOUTH AND THROAT WITH OR WITHOUT PRESENCE OF ULCERS  *URINARY PROBLEMS  *BOWEL PROBLEMS  UNUSUAL RASH Items with * indicate a potential emergency and should be followed up as soon as possible.  One of the nurses will contact you 24 hours after your treatment. Please let the nurse know about any problems that you may have experienced. Feel free to call the clinic you have any questions or concerns. The clinic phone number is [phone].   I have been informed and understand all the instructions given to me. I know to contact the clinic, my physician, or go to the Emergency Department if any problems should occur. I do not have any questions at this time, but understand that I may call the clinic during office hours   should I have any questions or need assistance in obtaining follow up care.    __________________________________________  _____________  __________ Signature of Patient or Authorized Representative            Date                   Time    __________________________________________ Nurse's Signature    

## 2013-04-08 NOTE — Telephone Encounter (Signed)
gv and printed pt appt sched for May

## 2013-04-08 NOTE — Progress Notes (Signed)
I spoke with patient in the chemotherapy area. His weight has declined approximately 5 pounds and was documented as 227.7 pounds April 29 which is decreased from 233 pounds documented April 16. Today is his first chemotherapy appointment. He reports he thinks he lost weight secondary to less than usual intake. He is not having any side effects yet.  Nutrition diagnosis: Predicted suboptimal energy intake has evolved into inadequate oral intake related to diagnosis of esophageal cancer as evidenced by a 5 pound weight loss in 2 weeks.  Intervention: Patient was educated to increase total calories and protein throughout the day. I've encouraged him to add an oral nutrition supplement twice a day. I again educated him on strategies for preventing nausea. I've answered his questions. Teach back method used.  Monitoring, evaluation, and goals: Patient will tolerate increased calories and protein to minimize weight loss throughout treatment.  Next visit: Tuesday, may 6, during chemotherapy.

## 2013-04-08 NOTE — Progress Notes (Signed)
   [facility]    OFFICE PROGRESS NOTE   INTERVAL HISTORY:   He returns as scheduled. He began radiation yesterday. He continues to have solid dysphagia. He saw Dr. Tyrone Sage to discuss surgical options.  Objective:  Vital signs in last 24 hours:  Blood pressure 139/84, pulse 84, temperature 98.3 F (36.8 C), temperature source Oral, resp. rate 18, height 6' (1.829 m), weight 227 lb 11.2 oz (103.284 kg).   Resp: Lungs clear bilaterally Cardio: Regular rate and rhythm GI: No hepatosplenomegaly, nontender Vascular: No leg edema    Lab Results:  Lab Results  Component Value Date   WBC 7.1 04/08/2013   HGB 14.1 04/08/2013   HCT 43.9 04/08/2013   MCV 78.7* 04/08/2013   PLT 246 04/08/2013   ANC 5.1, creatinine 0.8   Medications: I have reviewed the patient's current medications.  Assessment/Plan: 1. Adenocarcinoma of the distal esophagus/gastric cardia-status post an endoscopic biopsy on 03/18/2013 confirming adenocarcinoma  -Staging CT scans 03/20/2013 confirmed a distal esophageal/gastric cardia mass, distal paraesophageal lymph nodes and paratracheal nodes concerning for metastatic lymphadenopathy  -A PET scan 03/27/2013 with a multifocal area of hypermetabolic activity involving the thoracic esophagus extending into the gastric cardia and hypermetabolic mediastinal nodes, no distant metastatic disease 2. Indeterminate 15 mm right liver lesion on the CT scan 03/20/2013  3. Anorexia/weight loss and solid dysphagia secondary to #1  4. History of a colon polyp, status post a polypectomy October 2013    Disposition:  He has been diagnosed with locally advanced adenocarcinoma of the distal esophagus/gastric cardia. His case was presented at the GI tumor conference. He has been seen in consultation by Drs. Moody and Melrose. The plan is to proceed with neoadjuvant Taxol/carboplatin and radiation. He will then undergo a restaging evaluation. The plan is to then  proceed with an esophagogastrectomy procedure if there is no evidence of distant metastatic disease.  He will complete week 1 of chemotherapy today. He will return for an office visit on 04/21/2013.   Thornton Papas, MD  04/08/2013  1:48 PM

## 2013-04-08 NOTE — Progress Notes (Signed)
Confirmed with patient that he did not take any premeds at home for today's treatment. MD is aware. Antiemetic script sent to pharmacy.

## 2013-04-09 ENCOUNTER — Other Ambulatory Visit: Payer: Self-pay | Admitting: Certified Registered Nurse Anesthetist

## 2013-04-09 ENCOUNTER — Ambulatory Visit
Admission: RE | Admit: 2013-04-09 | Discharge: 2013-04-09 | Disposition: A | Discharge status: Home / Self Care | Payer: BC Managed Care – PPO | Source: Ambulatory Visit | Attending: Radiation Oncology | Admitting: Radiation Oncology

## 2013-04-10 ENCOUNTER — Telehealth: Payer: Self-pay | Admitting: *Deleted

## 2013-04-10 ENCOUNTER — Ambulatory Visit
Admission: RE | Admit: 2013-04-10 | Discharge: 2013-04-10 | Disposition: A | Discharge status: Home / Self Care | Payer: BC Managed Care – PPO | Source: Ambulatory Visit | Attending: Radiation Oncology | Admitting: Radiation Oncology

## 2013-04-10 LAB — CEA: CEA: 53 ng/mL — ABNORMAL HIGH (ref 0.0–5.0)

## 2013-04-10 NOTE — Telephone Encounter (Signed)
Dr. Earley Abide returned F/U call.  Reports he is fatigued.  Stools are compact and having a hard time getting stools out.  Has been gassy for two days, is burping and passing a lot of flatulence.  Still able to eat.  He has taken anti-emetic and no emesis.  Reports he will pick up pain med today.  This nurse asked that he drink the minimum 64 oz water and go above this amount.  Discussed use of colace or senokot-S to establish a bowel regimen with start of pain medicines as he is already experiencing some signs of constipation.  Informed him we gave pepcid before treatment asking if he needs something for this and he denies need at this time.

## 2013-04-10 NOTE — Telephone Encounter (Signed)
Called home and mobile numbers.  Message left requesting return call for f/o.  Awaiting return call from patient.

## 2013-04-10 NOTE — Telephone Encounter (Signed)
Message copied by Augusto Garbe on Thu Apr 10, 2013  2:10 PM ------      Message from: Lenn Sink I      Created: Tue Apr 08, 2013  4:09 PM      Regarding: chemo follow up call       First time taxol and Carboplatin. No reaction. Dr. Truett Perna. S.Camp, RN ------

## 2013-04-11 ENCOUNTER — Ambulatory Visit
Admission: RE | Admit: 2013-04-11 | Discharge: 2013-04-11 | Disposition: A | Discharge status: Home / Self Care | Payer: BC Managed Care – PPO | Source: Ambulatory Visit | Attending: Radiation Oncology | Admitting: Radiation Oncology

## 2013-04-11 VITALS — BP 130/82 | HR 88 | Temp 98.9°F | Wt 230.3 lb

## 2013-04-11 DIAGNOSIS — C155 Malignant neoplasm of lower third of esophagus: Secondary | ICD-10-CM

## 2013-04-11 MED ORDER — RADIAPLEXRX EX GEL
Freq: Once | CUTANEOUS | Status: AC
  Administered 2013-04-11: 16:00:00 via TOPICAL

## 2013-04-11 NOTE — Progress Notes (Signed)
   Department of Radiation Oncology  Phone:  [phone] Fax:        [phone]  Weekly Treatment Note    Name: Joel Osborne Date: 04/11/2013 MRN: [id number] DOB: 24-Jan-1966   Current dose: 9 Gy  Current fraction: 5   MEDICATIONS: Current Outpatient Prescriptions  Medication Sig Dispense Refill  . atorvastatin (LIPITOR) 20 MG tablet Take 20 mg by mouth daily.       . Canagliflozin (INVOKANA PO) Take 1 tablet by mouth daily.      Marland Kitchen HYDROcodone-acetaminophen (HYCET) 7.5-325 mg/15 ml solution Take 15 mLs by mouth 4 (four) times daily as needed for pain.  500 mL  0  . ibuprofen (ADVIL,MOTRIN) 200 MG tablet Take 200 mg by mouth every 6 (six) hours as needed for pain.      . Multiple Vitamins-Minerals (MULTIVITAMIN WITH MINERALS) tablet Take 1 tablet by mouth daily.      . prochlorperazine (COMPAZINE) 10 MG tablet Take 1 tablet (10 mg total) by mouth every 6 (six) hours as needed (nausea).  60 tablet  2   No current facility-administered medications for this encounter.     ALLERGIES: Review of patient's allergies indicates no known allergies.   LABORATORY DATA:  Lab Results  Component Value Date   WBC 7.1 04/08/2013   HGB 14.1 04/08/2013   HCT 43.9 04/08/2013   MCV 78.7* 04/08/2013   PLT 246 04/08/2013   Lab Results  Component Value Date   NA 138 04/08/2013   K 4.1 04/08/2013   CL 105 04/08/2013   CO2 24 04/08/2013   Lab Results  Component Value Date   ALT 19 04/08/2013   AST 14 04/08/2013   ALKPHOS 71 04/08/2013   BILITOT 0.48 04/08/2013     NARRATIVE: Joel Osborne was seen today for weekly treatment management. The chart was checked and the patient's films were reviewed. The patient is is doing well with treatment so far. No major issues. He began using pain medicine elixir yesterday. A little bit of diarrhea today.  PHYSICAL EXAMINATION: weight is 230 lb 4.8 oz (104.463 kg). His temperature is 98.9 F (37.2 C). His blood pressure is 130/82 and his pulse is  88.        ASSESSMENT: The patient is doing satisfactorily with treatment.  PLAN: We will continue with the patient's radiation treatment as planned. The patient will continue his current medicine and add Imodium medication if necessary.

## 2013-04-11 NOTE — Progress Notes (Signed)
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

## 2013-04-14 ENCOUNTER — Telehealth: Payer: Self-pay | Admitting: *Deleted

## 2013-04-14 ENCOUNTER — Ambulatory Visit
Admission: RE | Admit: 2013-04-14 | Discharge: 2013-04-14 | Disposition: A | Discharge status: Home / Self Care | Payer: BC Managed Care – PPO | Source: Ambulatory Visit | Attending: Radiation Oncology | Admitting: Radiation Oncology

## 2013-04-14 ENCOUNTER — Other Ambulatory Visit: Payer: Self-pay | Admitting: Oncology

## 2013-04-14 NOTE — Telephone Encounter (Signed)
Pt called re:  "do I need driver for chemo tomorrow?"  Called and spoke with pt; recommended that he not drive himself d/t benadryl 50 mg IV will be getting prior to treatment.  Pt verbalized understanding and expressed appreciation for call back.

## 2013-04-15 ENCOUNTER — Ambulatory Visit
Admission: RE | Admit: 2013-04-15 | Discharge: 2013-04-15 | Disposition: A | Discharge status: Home / Self Care | Payer: BC Managed Care – PPO | Source: Ambulatory Visit | Attending: Radiation Oncology | Admitting: Radiation Oncology

## 2013-04-15 ENCOUNTER — Ambulatory Visit (HOSPITAL_BASED_OUTPATIENT_CLINIC_OR_DEPARTMENT_OTHER): Payer: BC Managed Care – PPO

## 2013-04-15 ENCOUNTER — Other Ambulatory Visit (HOSPITAL_BASED_OUTPATIENT_CLINIC_OR_DEPARTMENT_OTHER): Payer: BC Managed Care – PPO

## 2013-04-15 ENCOUNTER — Ambulatory Visit: Payer: BC Managed Care – PPO | Admitting: Nutrition

## 2013-04-15 VITALS — BP 139/81 | HR 93 | Temp 99.3°F | Resp 20

## 2013-04-15 DIAGNOSIS — C155 Malignant neoplasm of lower third of esophagus: Secondary | ICD-10-CM

## 2013-04-15 DIAGNOSIS — Z5111 Encounter for antineoplastic chemotherapy: Secondary | ICD-10-CM

## 2013-04-15 LAB — CBC WITH DIFFERENTIAL/PLATELET
Basophils Absolute: 0 10*3/uL (ref 0.0–0.1)
HCT: 41.7 % (ref 38.4–49.9)
HGB: 13.4 g/dL (ref 13.0–17.1)
MONO#: 0.3 10*3/uL (ref 0.1–0.9)
NEUT#: 3.7 10*3/uL (ref 1.5–6.5)
NEUT%: 79.9 % — ABNORMAL HIGH (ref 39.0–75.0)
WBC: 4.6 10*3/uL (ref 4.0–10.3)
lymph#: 0.5 10*3/uL — ABNORMAL LOW (ref 0.9–3.3)

## 2013-04-15 MED ORDER — FAMOTIDINE IN NACL 20-0.9 MG/50ML-% IV SOLN
20.0000 mg | Freq: Once | INTRAVENOUS | Status: AC
  Administered 2013-04-15: 20 mg via INTRAVENOUS

## 2013-04-15 MED ORDER — SODIUM CHLORIDE 0.9 % IV SOLN
50.0000 mg/m2 | Freq: Once | INTRAVENOUS | Status: AC
  Administered 2013-04-15: 114 mg via INTRAVENOUS
  Filled 2013-04-15: qty 19

## 2013-04-15 MED ORDER — DEXAMETHASONE SODIUM PHOSPHATE 10 MG/ML IJ SOLN
10.0000 mg | Freq: Once | INTRAMUSCULAR | Status: AC
  Administered 2013-04-15: 10 mg via INTRAVENOUS

## 2013-04-15 MED ORDER — SODIUM CHLORIDE 0.9 % IV SOLN
300.0000 mg | Freq: Once | INTRAVENOUS | Status: AC
  Administered 2013-04-15: 300 mg via INTRAVENOUS
  Filled 2013-04-15: qty 30

## 2013-04-15 MED ORDER — SODIUM CHLORIDE 0.9 % IV SOLN
Freq: Once | INTRAVENOUS | Status: AC
  Administered 2013-04-15: 09:00:00 via INTRAVENOUS

## 2013-04-15 MED ORDER — ONDANSETRON 16 MG/50ML IVPB (CHCC)
16.0000 mg | Freq: Once | INTRAVENOUS | Status: AC
  Administered 2013-04-15: 16 mg via INTRAVENOUS

## 2013-04-15 NOTE — Progress Notes (Signed)
I spoke with patient today in chemotherapy. Patient reports that he did have diarrhea and alternating constipation after last chemotherapy. He had increased gas. He states he's been trying to eat more and has added a boost milk shake every day. He has no other nutrition concerns. Weight has increased to 230.3 pounds on May 2 which is increased from 227.7 pounds April 29.  Nutrition diagnosis: Inadequate oral intake improved.  Intervention: Patient was educated on strategies for eating with diarrhea/constipation. I've recommended patient follow a bland diet directly after chemotherapy and gradually add higher fiber foods as tolerated. Patient was encouraged to continue oral nutrition supplements to promote weight maintenance. Teach back method used. Questions answered.  Monitoring, evaluation, and goals: Patient has been able to tolerate increased calories and protein for slight weight gain.  Next visit: Tuesday, May 13, during chemotherapy.

## 2013-04-15 NOTE — Patient Instructions (Addendum)
[facility] Discharge Instructions for Patients Receiving Chemotherapy  Today you received the following chemotherapy agents taxol and Carboplatin.  To help prevent nausea and vomiting after your treatment, we encourage you to take your nausea medication as prescribed.    If you develop nausea and vomiting that is not controlled by your nausea medication, call the clinic. If it is after clinic hours your family physician or the after hours number for the clinic or go to the Emergency Department.   BELOW ARE SYMPTOMS THAT SHOULD BE REPORTED IMMEDIATELY:  *FEVER GREATER THAN 100.5 F  *CHILLS WITH OR WITHOUT FEVER  NAUSEA AND VOMITING THAT IS NOT CONTROLLED WITH YOUR NAUSEA MEDICATION  *UNUSUAL SHORTNESS OF BREATH  *UNUSUAL BRUISING OR BLEEDING  TENDERNESS IN MOUTH AND THROAT WITH OR WITHOUT PRESENCE OF ULCERS  *URINARY PROBLEMS  *BOWEL PROBLEMS  UNUSUAL RASH    Please let the nurse know about any problems that you may have experienced. Feel free to call the clinic you have any questions or concerns. The clinic phone number is [phone].   I have been informed and understand all the instructions given to me. I know to contact the clinic, my physician, or go to the Emergency Department if any problems should occur. I do not have any questions at this time, but understand that I may call the clinic during office hours   should I have any questions or need assistance in obtaining follow up care.    __________________________________________  _____________  __________ Signature of Patient or Authorized Representative            Date                   Time    __________________________________________ Nurse's Signature

## 2013-04-16 ENCOUNTER — Ambulatory Visit
Admission: RE | Admit: 2013-04-16 | Discharge: 2013-04-16 | Disposition: A | Discharge status: Home / Self Care | Payer: BC Managed Care – PPO | Source: Ambulatory Visit | Attending: Radiation Oncology | Admitting: Radiation Oncology

## 2013-04-17 ENCOUNTER — Ambulatory Visit
Admission: RE | Admit: 2013-04-17 | Discharge: 2013-04-17 | Disposition: A | Discharge status: Home / Self Care | Payer: BC Managed Care – PPO | Source: Ambulatory Visit | Attending: Radiation Oncology | Admitting: Radiation Oncology

## 2013-04-18 ENCOUNTER — Ambulatory Visit
Admission: RE | Admit: 2013-04-18 | Discharge: 2013-04-18 | Disposition: A | Discharge status: Home / Self Care | Payer: BC Managed Care – PPO | Source: Ambulatory Visit | Attending: Radiation Oncology | Admitting: Radiation Oncology

## 2013-04-18 VITALS — BP 130/82 | HR 94 | Temp 98.7°F | Ht 72.0 in | Wt 229.6 lb

## 2013-04-18 DIAGNOSIS — C155 Malignant neoplasm of lower third of esophagus: Secondary | ICD-10-CM

## 2013-04-18 MED ORDER — SUCRALFATE 1 G PO TABS: 1.0000 g | ORAL_TABLET | Freq: Four times a day (QID) | ORAL | Status: DC

## 2013-04-18 NOTE — Progress Notes (Signed)
   Department of Radiation Oncology  Phone:  [phone] Fax:        [phone]  Weekly Treatment Note    Name: Joel Osborne Date: 04/18/2013 MRN: [id number] DOB: 10-01-1966   Current dose: 18 Gy  Current fraction: 10   MEDICATIONS: Current Outpatient Prescriptions  Medication Sig Dispense Refill  . atorvastatin (LIPITOR) 20 MG tablet Take 20 mg by mouth daily.       . Canagliflozin (INVOKANA PO) Take 1 tablet by mouth daily.      Marland Kitchen HYDROcodone-acetaminophen (HYCET) 7.5-325 mg/15 ml solution Take 15 mLs by mouth 4 (four) times daily as needed for pain.  500 mL  0  . ibuprofen (ADVIL,MOTRIN) 200 MG tablet Take 200 mg by mouth every 6 (six) hours as needed for pain.      Marland Kitchen prochlorperazine (COMPAZINE) 10 MG tablet Take 1 tablet (10 mg total) by mouth every 6 (six) hours as needed (nausea).  60 tablet  2  . Multiple Vitamins-Minerals (MULTIVITAMIN WITH MINERALS) tablet Take 1 tablet by mouth daily.      . sucralfate (CARAFATE) 1 G tablet Take 1 tablet (1 g total) by mouth 4 (four) times daily. Dissolve in 15 cc water.  120 tablet  1   No current facility-administered medications for this encounter.     ALLERGIES: Review of patient's allergies indicates no known allergies.   LABORATORY DATA:  Lab Results  Component Value Date   WBC 4.6 04/15/2013   HGB 13.4 04/15/2013   HCT 41.7 04/15/2013   MCV 79.9 04/15/2013   PLT 235 04/15/2013   Lab Results  Component Value Date   NA 138 04/08/2013   K 4.1 04/08/2013   CL 105 04/08/2013   CO2 24 04/08/2013   Lab Results  Component Value Date   ALT 19 04/08/2013   AST 14 04/08/2013   ALKPHOS 71 04/08/2013   BILITOT 0.48 04/08/2013     NARRATIVE: Delquan Poucher Sult was seen today for weekly treatment management. The chart was checked and the patient's films were reviewed. The patient is having some increased esophagitis which he describes as a burning feeling in the mid sternal region. Some increased difficulty swallowing due to  this.  PHYSICAL EXAMINATION: height is 6' (1.829 m) and weight is 229 lb 9.6 oz (104.146 kg). His temperature is 98.7 F (37.1 C). His blood pressure is 130/82 and his pulse is 94. His oxygen saturation is 98%.        ASSESSMENT: The patient is doing satisfactorily with treatment. He is beginning to have some symptoms from his treatment to the chest.  PLAN: We will continue with the patient's radiation treatment as planned. The patient will begin Prilosec and I have also given him a prescription for Carafate which she will begin immediately. The patient does have liquid pain medication to use as needed as well.

## 2013-04-18 NOTE — Progress Notes (Signed)
Mr. Joel Osborne is here for weekly under treat appointment.  He has had 10/28 fractions to his esophagus.  He does have a buring feeling in his mid sternal area.  He also states he is feeling bad today.  He is having nausea and states today is the first day he has had it.  He also feels weak and tired.  He states he does have trouble swallowing certain foods but thinks it is getting better.

## 2013-04-19 ENCOUNTER — Ambulatory Visit
Admission: RE | Admit: 2013-04-19 | Discharge: 2013-04-19 | Disposition: A | Discharge status: Home / Self Care | Payer: BC Managed Care – PPO | Source: Ambulatory Visit | Attending: Radiation Oncology | Admitting: Radiation Oncology

## 2013-04-20 ENCOUNTER — Other Ambulatory Visit: Payer: Self-pay | Admitting: Oncology

## 2013-04-21 ENCOUNTER — Ambulatory Visit
Admission: RE | Admit: 2013-04-21 | Discharge: 2013-04-21 | Disposition: A | Discharge status: Home / Self Care | Payer: BC Managed Care – PPO | Source: Ambulatory Visit | Attending: Radiation Oncology | Admitting: Radiation Oncology

## 2013-04-21 NOTE — Progress Notes (Signed)
  Radiation Oncology         [phone] ________________________________  Name: Joel Osborne MRN: [id number]  Date: 03/26/2013  DOB: 1966/06/30  SIMULATION AND TREATMENT PLANNING NOTE   CONSENT VERIFIED: yes   SET UP: Patient is set-up supine   IMMOBILIZATION: The following immobilization is used: alpha-cradle. This complex treatment device will be used on a daily basis during the patient's treatment.   Diagnosis: Esophageal cancer   NARRATIVE: The patient was brought to the CT Simulation planning suite. Identity was confirmed. All relevant records and images related to the planned course of therapy were reviewed. Then, the patient was positioned in a stable reproducible clinical set-up for radiation therapy using an aquaplast mask. Skin markings were placed. The CT images were loaded into the planning software where the target and avoidance structures were contoured.The radiation prescription was entered and confirmed.   The patient will receive 50.4 Gy in 28 fractions to the high-dose target region.  Daily image guidance is ordered, and this will be used on a daily basis. This is necessary to ensure accurate and precise localization of the target in addition to accurate alignment of the normal tissue structures in this region.   Treatment planning then occurred.   I have requested : Intensity Modulated Radiotherapy (IMRT) is medically necessary for this case for the following reason: Dose homogeneity; the target is in close proximity to critical normal structures, including the lungs, spinal cord, heart. IMRT is thus medically necessary to appropriately treat the patient.   Special treatment procedure  The patient will receive chemotherapy during the course of radiation treatment. The patient may experience increased or overlapping toxicity due to this combined-modality approach and the patient will be monitored for such problems. This may include extra lab work as necessary.  This therefore constitutes a special treatment procedure.     ________________________________  Radene Gunning, MD, PhD

## 2013-04-22 ENCOUNTER — Ambulatory Visit (HOSPITAL_BASED_OUTPATIENT_CLINIC_OR_DEPARTMENT_OTHER): Payer: BC Managed Care – PPO

## 2013-04-22 ENCOUNTER — Ambulatory Visit (HOSPITAL_BASED_OUTPATIENT_CLINIC_OR_DEPARTMENT_OTHER): Payer: BC Managed Care – PPO | Admitting: Oncology

## 2013-04-22 ENCOUNTER — Other Ambulatory Visit: Payer: Self-pay | Admitting: *Deleted

## 2013-04-22 ENCOUNTER — Other Ambulatory Visit (HOSPITAL_BASED_OUTPATIENT_CLINIC_OR_DEPARTMENT_OTHER): Payer: BC Managed Care – PPO | Admitting: Lab

## 2013-04-22 ENCOUNTER — Ambulatory Visit
Admission: RE | Admit: 2013-04-22 | Discharge: 2013-04-22 | Disposition: A | Discharge status: Home / Self Care | Payer: BC Managed Care – PPO | Source: Ambulatory Visit | Attending: Radiation Oncology | Admitting: Radiation Oncology

## 2013-04-22 ENCOUNTER — Ambulatory Visit: Payer: BC Managed Care – PPO | Admitting: Nutrition

## 2013-04-22 VITALS — BP 138/87 | HR 120 | Temp 98.9°F | Wt 226.8 lb

## 2013-04-22 DIAGNOSIS — K7689 Other specified diseases of liver: Secondary | ICD-10-CM

## 2013-04-22 DIAGNOSIS — C155 Malignant neoplasm of lower third of esophagus: Secondary | ICD-10-CM

## 2013-04-22 DIAGNOSIS — G4700 Insomnia, unspecified: Secondary | ICD-10-CM

## 2013-04-22 DIAGNOSIS — Z5111 Encounter for antineoplastic chemotherapy: Secondary | ICD-10-CM

## 2013-04-22 LAB — CBC WITH DIFFERENTIAL/PLATELET
Basophils Absolute: 0 10*3/uL (ref 0.0–0.1)
EOS%: 4 % (ref 0.0–7.0)
Eosinophils Absolute: 0.1 10*3/uL (ref 0.0–0.5)
HCT: 44.5 % (ref 38.4–49.9)
HGB: 14.3 g/dL (ref 13.0–17.1)
MCH: 25.9 pg — ABNORMAL LOW (ref 27.2–33.4)
MCV: 80.6 fL (ref 79.3–98.0)
NEUT#: 2.5 10*3/uL (ref 1.5–6.5)
NEUT%: 77.1 % — ABNORMAL HIGH (ref 39.0–75.0)
lymph#: 0.3 10*3/uL — ABNORMAL LOW (ref 0.9–3.3)

## 2013-04-22 MED ORDER — SODIUM CHLORIDE 0.9 % IV SOLN
50.0000 mg/m2 | Freq: Once | INTRAVENOUS | Status: AC
  Administered 2013-04-22: 114 mg via INTRAVENOUS
  Filled 2013-04-22: qty 19

## 2013-04-22 MED ORDER — ONDANSETRON 16 MG/50ML IVPB (CHCC)
16.0000 mg | Freq: Once | INTRAVENOUS | Status: AC
  Administered 2013-04-22: 16 mg via INTRAVENOUS

## 2013-04-22 MED ORDER — FAMOTIDINE IN NACL 20-0.9 MG/50ML-% IV SOLN
20.0000 mg | Freq: Once | INTRAVENOUS | Status: AC
  Administered 2013-04-22: 20 mg via INTRAVENOUS

## 2013-04-22 MED ORDER — DIPHENHYDRAMINE HCL 50 MG/ML IJ SOLN
25.0000 mg | Freq: Once | INTRAMUSCULAR | Status: AC
  Administered 2013-04-22: 25 mg via INTRAVENOUS

## 2013-04-22 MED ORDER — SODIUM CHLORIDE 0.9 % IV SOLN
Freq: Once | INTRAVENOUS | Status: AC
  Administered 2013-04-22: 09:00:00 via INTRAVENOUS

## 2013-04-22 MED ORDER — HYDROCODONE-ACETAMINOPHEN 7.5-325 MG/15ML PO SOLN: 15.0000 mL | Freq: Four times a day (QID) | ORAL | Status: DC | PRN

## 2013-04-22 MED ORDER — SODIUM CHLORIDE 0.9 % IV SOLN
300.0000 mg | Freq: Once | INTRAVENOUS | Status: AC
  Administered 2013-04-22: 300 mg via INTRAVENOUS
  Filled 2013-04-22: qty 30

## 2013-04-22 MED ORDER — ZOLPIDEM TARTRATE 5 MG PO TABS: 5.0000 mg | ORAL_TABLET | Freq: Every evening | ORAL | Status: DC | PRN

## 2013-04-22 MED ORDER — DEXAMETHASONE SODIUM PHOSPHATE 10 MG/ML IJ SOLN
10.0000 mg | Freq: Once | INTRAMUSCULAR | Status: AC
  Administered 2013-04-22: 10 mg via INTRAVENOUS

## 2013-04-22 NOTE — Progress Notes (Signed)
Followup completed with patient during chemotherapy. Patient's weight has decreased to 226 pounds on May 12 from 229.6 pounds May 9. Patient does report nausea several times but no vomiting. He does take his nausea medication. He has some difficulty swallowing. He has been altering taste and temperature and textures of foods.  Nutrition diagnosis: Inadequate oral intake continues.  Intervention: Patient was educated to increase oral intake with higher calorie, higher protein foods to minimize further weight loss. Patient was educated to continue nausea medications and to discuss with healthcare provider if nausea continues on nausea medication. Patient has no other questions or concerns today. He is able to verbalize the importance of minimizing weight loss. Teach back method used.  Monitoring, evaluation, and goals: Patient will increase oral intake to minimize further weight loss.  Next visit: Tuesday, May 20, during chemotherapy.

## 2013-04-22 NOTE — Progress Notes (Signed)
Met with patient to assess for needs.  He states he is eating better and starting to notice that he is able to swallow foods a little easier.  He currently denies any barriers to care at present time.  This RN will continue to follow as needed.

## 2013-04-22 NOTE — Patient Instructions (Signed)
[facility] Discharge Instructions for Patients Receiving Chemotherapy  Today you received the following chemotherapy agents TAXOL,CARBOPLATIN To help prevent nausea and vomiting after your treatment, we encourage you to take your nausea medication   Take it as often as prescribed.   If you develop nausea and vomiting that is not controlled by your nausea medication, call the clinic. If it is after clinic hours your family physician or the after hours number for the clinic or go to the Emergency Department.   BELOW ARE SYMPTOMS THAT SHOULD BE REPORTED IMMEDIATELY:  *FEVER GREATER THAN 100.5 F  *CHILLS WITH OR WITHOUT FEVER  NAUSEA AND VOMITING THAT IS NOT CONTROLLED WITH YOUR NAUSEA MEDICATION  *UNUSUAL SHORTNESS OF BREATH  *UNUSUAL BRUISING OR BLEEDING  TENDERNESS IN MOUTH AND THROAT WITH OR WITHOUT PRESENCE OF ULCERS  *URINARY PROBLEMS  *BOWEL PROBLEMS  UNUSUAL RASH Items with * indicate a potential emergency and should be followed up as soon as possible.  If this is your first treatment one of the nurses will contact you 24 hours after your treatment. Please let the nurse know about any problems that you may have experienced. Feel free to call the clinic you have any questions or concerns. The clinic phone number is [phone].   I have been informed and understand all the instructions given to me. I know to contact the clinic, my physician, or go to the Emergency Department if any problems should occur. I do not have any questions at this time, but understand that I may call the clinic during office hours   should I have any questions or need assistance in obtaining follow up care.    __________________________________________  _____________  __________ Signature of Patient or Authorized Representative            Date                   Time    __________________________________________ Nurse's Signature

## 2013-04-22 NOTE — Progress Notes (Signed)
   [facility]    OFFICE PROGRESS NOTE   INTERVAL HISTORY:   Scheduled he is completed 2 weeks of concurrent Taxol/carboplatin and radiation. He continues to have solid dysphagia and odynophagia. The discomfort is relieved with hydrocodone. No neuropathy symptoms. He complains of malaise. He is tolerating liquids well. He complains of insomnia. He continues to work.  Objective:  Vital signs in last 24 hours:  There were no vitals taken for this visit.    HEENT: No thrush or ulcers Resp: Lungs clear bilaterally Cardio: Regular rate and rhythm GI: No hepatomegaly, nontender Vascular: No leg edema  Skin: Mild erythema at the upper anterior chest    Lab Results:  Lab Results  Component Value Date   WBC 3.3* 04/22/2013   HGB 14.3 04/22/2013   HCT 44.5 04/22/2013   MCV 80.6 04/22/2013   PLT 169 04/22/2013   ANC 2.5    Medications: I have reviewed the patient's current medications.  Assessment/Plan: 1.Adenocarcinoma of the distal esophagus/gastric cardia-status post an endoscopic biopsy on 03/18/2013 confirming adenocarcinoma  -Staging CT scans 03/20/2013 confirmed a distal esophageal/gastric cardia mass, distal paraesophageal lymph nodes and paratracheal nodes concerning for metastatic lymphadenopathy  -A PET scan 03/27/2013 with a multifocal area of hypermetabolic activity involving the thoracic esophagus extending into the gastric cardia and hypermetabolic mediastinal nodes, no distant metastatic disease  -Initiation of radiation 04/07/2013 and weekly Taxol/carboplatin 04/08/2013 2. Indeterminate 15 mm right liver lesion on the CT scan 03/20/2013  3. Anorexia/weight loss and solid dysphagia secondary to #1 -he is maintaining his weight 4. History of a colon polyp, status post a polypectomy October 2013  5. Insomnia-he will try Ambien  Disposition:  He appears to be tolerating the concurrent chemotherapy and radiation well. He will continue hydrocodone as  needed for pain. The plan is to proceed with week #3 of Taxol/carboplatin today. He will complete week #4 on 04/29/2013. He is scheduled for an office visit and week #5 on 05/06/2013.   Thornton Papas, MD  04/22/2013  11:37 AM

## 2013-04-23 ENCOUNTER — Ambulatory Visit
Admission: RE | Admit: 2013-04-23 | Discharge: 2013-04-23 | Disposition: A | Discharge status: Home / Self Care | Payer: BC Managed Care – PPO | Source: Ambulatory Visit | Attending: Radiation Oncology | Admitting: Radiation Oncology

## 2013-04-24 ENCOUNTER — Telehealth: Payer: Self-pay | Admitting: Oncology

## 2013-04-24 ENCOUNTER — Ambulatory Visit
Admission: RE | Admit: 2013-04-24 | Discharge: 2013-04-24 | Disposition: A | Discharge status: Home / Self Care | Payer: BC Managed Care – PPO | Source: Ambulatory Visit | Attending: Radiation Oncology | Admitting: Radiation Oncology

## 2013-04-24 NOTE — Telephone Encounter (Signed)
Gave pt appt for May and advised him to get calendar tomorrow

## 2013-04-24 NOTE — Telephone Encounter (Signed)
Called Marcelino Duster today regarding chemo appt needs to be adjusted on 05/06/13

## 2013-04-25 ENCOUNTER — Ambulatory Visit
Admission: RE | Admit: 2013-04-25 | Discharge: 2013-04-25 | Disposition: A | Discharge status: Home / Self Care | Payer: BC Managed Care – PPO | Source: Ambulatory Visit | Attending: Radiation Oncology | Admitting: Radiation Oncology

## 2013-04-25 VITALS — BP 147/80 | HR 115 | Temp 98.1°F | Ht 72.0 in | Wt 229.2 lb

## 2013-04-25 DIAGNOSIS — C155 Malignant neoplasm of lower third of esophagus: Secondary | ICD-10-CM

## 2013-04-25 MED ORDER — RADIAPLEXRX EX GEL
Freq: Once | CUTANEOUS | Status: AC
  Administered 2013-04-25: 16:00:00 via TOPICAL

## 2013-04-25 NOTE — Progress Notes (Signed)
Mr. Joel Osborne here for weekly under treat visit.  He has had 15/28 fractions to his esophagus.  He is having pain when swallowing today that he rates at a 2/10.  He feels like there is something stuck in his throat.  He is able to swallow soft foods.  On inspections, his throat is red.  He does have fatigue.  He is also having nausea and is taking compazine once a day.  The skin on his chest is pink.  He has been given radiaplex gel and advised to use it twice a day on the treatment area.  He also had diarrhea last night.

## 2013-04-25 NOTE — Progress Notes (Signed)
   Department of Radiation Oncology  Phone:  [phone] Fax:        [phone]  Weekly Treatment Note    Name: Joel Osborne Date: 04/25/2013 MRN: [id number] DOB: 08/03/1966   Current dose: 27 Gy  Current fraction: 15   MEDICATIONS: Current Outpatient Prescriptions  Medication Sig Dispense Refill  . atorvastatin (LIPITOR) 20 MG tablet Take 20 mg by mouth daily.       . Canagliflozin (INVOKANA PO) Take 1 tablet by mouth daily.      Marland Kitchen HYDROcodone-acetaminophen (HYCET) 7.5-325 mg/15 ml solution Take 15 mLs by mouth 4 (four) times daily as needed for pain.  600 mL  0  . ibuprofen (ADVIL,MOTRIN) 200 MG tablet Take 200 mg by mouth every 6 (six) hours as needed for pain.      . Multiple Vitamins-Minerals (MULTIVITAMIN WITH MINERALS) tablet Take 2 tablets by mouth daily. Chewable MVI      . prochlorperazine (COMPAZINE) 10 MG tablet Take 1 tablet (10 mg total) by mouth every 6 (six) hours as needed (nausea).  60 tablet  2  . sucralfate (CARAFATE) 1 G tablet Take 1 tablet (1 g total) by mouth 4 (four) times daily. Dissolve in 15 cc water.  120 tablet  1  . zolpidem (AMBIEN) 5 MG tablet Take 1-2 tablets (5-10 mg total) by mouth at bedtime as needed for sleep.  60 tablet  0   No current facility-administered medications for this encounter.     ALLERGIES: Review of patient's allergies indicates no known allergies.   LABORATORY DATA:  Lab Results  Component Value Date   WBC 3.3* 04/22/2013   HGB 14.3 04/22/2013   HCT 44.5 04/22/2013   MCV 80.6 04/22/2013   PLT 169 04/22/2013   Lab Results  Component Value Date   NA 138 04/08/2013   K 4.1 04/08/2013   CL 105 04/08/2013   CO2 24 04/08/2013   Lab Results  Component Value Date   ALT 19 04/08/2013   AST 14 04/08/2013   ALKPHOS 71 04/08/2013   BILITOT 0.48 04/08/2013     NARRATIVE: Joel Osborne was seen today for weekly treatment management. The chart was checked and the patient's films were reviewed. The patient is having  some ongoing esophagitis. He ran out of pain medicine earlier which may have contributed to some increased discomfort. Some diarrhea over the last day but this has not been an ongoing issue.  PHYSICAL EXAMINATION: height is 6' (1.829 m) and weight is 229 lb 3.2 oz (103.964 kg). His temperature is 98.1 F (36.7 C). His blood pressure is 147/80 and his pulse is 115.      some faint erythema in the chest centrally anteriorly and posteriorly  ASSESSMENT: The patient is doing satisfactorily with treatment.  PLAN: We will continue with the patient's radiation treatment as planned.

## 2013-04-25 NOTE — Addendum Note (Signed)
Encounter addended by: Coston Mandato R Estelle Skibicki, RN on: 04/25/2013  3:55 PM<BR>     Documentation filed: Inpatient MAR

## 2013-04-25 NOTE — Addendum Note (Signed)
Encounter addended by: Eduardo Osier, RN on: 04/25/2013  3:50 PM<BR>     Documentation filed: Orders

## 2013-04-28 ENCOUNTER — Ambulatory Visit
Admission: RE | Admit: 2013-04-28 | Discharge: 2013-04-28 | Disposition: A | Discharge status: Home / Self Care | Payer: BC Managed Care – PPO | Source: Ambulatory Visit | Attending: Radiation Oncology | Admitting: Radiation Oncology

## 2013-04-29 ENCOUNTER — Ambulatory Visit (HOSPITAL_BASED_OUTPATIENT_CLINIC_OR_DEPARTMENT_OTHER): Payer: BC Managed Care – PPO

## 2013-04-29 ENCOUNTER — Other Ambulatory Visit (HOSPITAL_BASED_OUTPATIENT_CLINIC_OR_DEPARTMENT_OTHER): Payer: BC Managed Care – PPO | Admitting: Lab

## 2013-04-29 ENCOUNTER — Other Ambulatory Visit: Payer: Self-pay | Admitting: Oncology

## 2013-04-29 ENCOUNTER — Ambulatory Visit
Admission: RE | Admit: 2013-04-29 | Discharge: 2013-04-29 | Disposition: A | Discharge status: Home / Self Care | Payer: BC Managed Care – PPO | Source: Ambulatory Visit | Attending: Radiation Oncology | Admitting: Radiation Oncology

## 2013-04-29 ENCOUNTER — Ambulatory Visit: Payer: BC Managed Care – PPO | Admitting: Nutrition

## 2013-04-29 VITALS — BP 156/88 | HR 93 | Temp 98.7°F

## 2013-04-29 DIAGNOSIS — Z5111 Encounter for antineoplastic chemotherapy: Secondary | ICD-10-CM

## 2013-04-29 DIAGNOSIS — C155 Malignant neoplasm of lower third of esophagus: Secondary | ICD-10-CM

## 2013-04-29 LAB — CBC WITH DIFFERENTIAL/PLATELET
Basophils Absolute: 0.1 10*3/uL (ref 0.0–0.1)
EOS%: 2.4 % (ref 0.0–7.0)
HCT: 40.8 % (ref 38.4–49.9)
HGB: 13.5 g/dL (ref 13.0–17.1)
MCH: 26.6 pg — ABNORMAL LOW (ref 27.2–33.4)
MCV: 80.5 fL (ref 79.3–98.0)
MONO%: 13.9 % (ref 0.0–14.0)
NEUT%: 71.9 % (ref 39.0–75.0)
lymph#: 0.3 10*3/uL — ABNORMAL LOW (ref 0.9–3.3)

## 2013-04-29 LAB — COMPREHENSIVE METABOLIC PANEL (CC13)
Albumin: 3.4 g/dL — ABNORMAL LOW (ref 3.5–5.0)
Alkaline Phosphatase: 61 U/L (ref 40–150)
BUN: 10.6 mg/dL (ref 7.0–26.0)
Glucose: 121 mg/dl — ABNORMAL HIGH (ref 70–99)
Potassium: 4 mEq/L (ref 3.5–5.1)

## 2013-04-29 MED ORDER — SODIUM CHLORIDE 0.9 % IV SOLN
Freq: Once | INTRAVENOUS | Status: AC
  Administered 2013-04-29: 09:00:00 via INTRAVENOUS

## 2013-04-29 MED ORDER — FAMOTIDINE IN NACL 20-0.9 MG/50ML-% IV SOLN
20.0000 mg | Freq: Once | INTRAVENOUS | Status: AC
  Administered 2013-04-29: 20 mg via INTRAVENOUS

## 2013-04-29 MED ORDER — SODIUM CHLORIDE 0.9 % IV SOLN
50.0000 mg/m2 | Freq: Once | INTRAVENOUS | Status: AC
  Administered 2013-04-29: 114 mg via INTRAVENOUS
  Filled 2013-04-29: qty 19

## 2013-04-29 MED ORDER — DEXAMETHASONE SODIUM PHOSPHATE 10 MG/ML IJ SOLN
10.0000 mg | Freq: Once | INTRAMUSCULAR | Status: AC
  Administered 2013-04-29: 10 mg via INTRAVENOUS

## 2013-04-29 MED ORDER — DIPHENHYDRAMINE HCL 50 MG/ML IJ SOLN
25.0000 mg | Freq: Once | INTRAMUSCULAR | Status: AC
  Administered 2013-04-29: 25 mg via INTRAVENOUS

## 2013-04-29 MED ORDER — ONDANSETRON 16 MG/50ML IVPB (CHCC)
16.0000 mg | Freq: Once | INTRAVENOUS | Status: AC
  Administered 2013-04-29: 16 mg via INTRAVENOUS

## 2013-04-29 MED ORDER — SODIUM CHLORIDE 0.9 % IV SOLN
300.0000 mg | Freq: Once | INTRAVENOUS | Status: AC
  Administered 2013-04-29: 300 mg via INTRAVENOUS
  Filled 2013-04-29: qty 30

## 2013-04-29 NOTE — Patient Instructions (Signed)
[facility] Discharge Instructions for Patients Receiving Chemotherapy  Today you received the following chemotherapy agents Taxol and Carboplatin  To help prevent nausea and vomiting after your treatment, we encourage you to take your nausea medication as prescribed.   If you develop nausea and vomiting that is not controlled by your nausea medication, call the clinic. If it is after clinic hours your family physician or the after hours number for the clinic or go to the Emergency Department.   BELOW ARE SYMPTOMS THAT SHOULD BE REPORTED IMMEDIATELY:  *FEVER GREATER THAN 100.5 F  *CHILLS WITH OR WITHOUT FEVER  NAUSEA AND VOMITING THAT IS NOT CONTROLLED WITH YOUR NAUSEA MEDICATION  *UNUSUAL SHORTNESS OF BREATH  *UNUSUAL BRUISING OR BLEEDING  TENDERNESS IN MOUTH AND THROAT WITH OR WITHOUT PRESENCE OF ULCERS  *URINARY PROBLEMS  *BOWEL PROBLEMS  UNUSUAL RASH Items with * indicate a potential emergency and should be followed up as soon as possible.  One of the nurses will contact you 24 hours after your treatment. Please let the nurse know about any problems that you may have experienced. Feel free to call the clinic you have any questions or concerns. The clinic phone number is [phone].   

## 2013-04-29 NOTE — Progress Notes (Signed)
Reviewed CBC results, OK per Dr. Truett Perna with WBC 2.9

## 2013-04-29 NOTE — Progress Notes (Signed)
Patient reports worsening esophagitis. He continues to eat and weight has increased to 229.2 pounds on May 16 from 226 pounds May 13. He denies other nutrition side effects. He continues to alter textures and temperatures of foods as needed for improved taste. He is drinking boost oral nutrition supplements.  Nutrition diagnosis: Inadequate oral intake continues but has improved.  Intervention: Patient was educated to continue to consume higher calorie, higher protein foods to minimize weight loss. He was encouraged to continue to modify textures and moisture content of foods for easier swallowing. He is to continue boost oral nutrition supplements. Teach back method used.  Monitoring, evaluation, and goals: Patient has been able to increase oral intake to promote weight stabilization.  Next visit: Tuesday, May 27, during chemotherapy.

## 2013-04-30 ENCOUNTER — Ambulatory Visit
Admission: RE | Admit: 2013-04-30 | Discharge: 2013-04-30 | Disposition: A | Discharge status: Home / Self Care | Payer: BC Managed Care – PPO | Source: Ambulatory Visit | Attending: Radiation Oncology | Admitting: Radiation Oncology

## 2013-05-01 ENCOUNTER — Ambulatory Visit
Admission: RE | Admit: 2013-05-01 | Discharge: 2013-05-01 | Disposition: A | Discharge status: Home / Self Care | Payer: BC Managed Care – PPO | Source: Ambulatory Visit | Attending: Radiation Oncology | Admitting: Radiation Oncology

## 2013-05-02 ENCOUNTER — Ambulatory Visit (HOSPITAL_BASED_OUTPATIENT_CLINIC_OR_DEPARTMENT_OTHER)
Admission: RE | Admit: 2013-05-02 | Discharge: 2013-05-02 | Disposition: A | Discharge status: Home / Self Care | Payer: BC Managed Care – PPO | Source: Ambulatory Visit | Attending: Radiation Oncology | Admitting: Radiation Oncology

## 2013-05-02 ENCOUNTER — Ambulatory Visit
Admission: RE | Admit: 2013-05-02 | Discharge: 2013-05-02 | Disposition: A | Discharge status: Home / Self Care | Payer: BC Managed Care – PPO | Source: Ambulatory Visit | Attending: Radiation Oncology | Admitting: Radiation Oncology

## 2013-05-02 VITALS — BP 132/84 | HR 108 | Temp 98.3°F | Ht 72.0 in | Wt 224.4 lb

## 2013-05-02 DIAGNOSIS — C155 Malignant neoplasm of lower third of esophagus: Secondary | ICD-10-CM

## 2013-05-02 NOTE — Progress Notes (Signed)
   Department of Radiation Oncology  Phone:  [phone] Fax:        [phone]  Weekly Treatment Note    Name: Joel Osborne Date: 05/02/2013 MRN: [id number] DOB: [birth year]   Current dose: 36 Gy  Current fraction: 20   MEDICATIONS: Current Outpatient Prescriptions  Medication Sig Dispense Refill  . atorvastatin (LIPITOR) 20 MG tablet Take 20 mg by mouth daily.       . Canagliflozin (INVOKANA PO) Take 1 tablet by mouth daily.      . hyaluronate sodium (RADIAPLEXRX) GEL Apply 1 application topically 2 (two) times daily.      Marland Kitchen HYDROcodone-acetaminophen (HYCET) 7.5-325 mg/15 ml solution Take 15 mLs by mouth 4 (four) times daily as needed for pain.  600 mL  0  . ibuprofen (ADVIL,MOTRIN) 200 MG tablet Take 200 mg by mouth every 6 (six) hours as needed for pain.      . Multiple Vitamins-Minerals (MULTIVITAMIN WITH MINERALS) tablet Take 2 tablets by mouth daily. Chewable MVI      . prochlorperazine (COMPAZINE) 10 MG tablet Take 1 tablet (10 mg total) by mouth every 6 (six) hours as needed (nausea).  60 tablet  2  . sucralfate (CARAFATE) 1 G tablet Take 1 tablet (1 g total) by mouth 4 (four) times daily. Dissolve in 15 cc water.  120 tablet  1  . zolpidem (AMBIEN) 5 MG tablet Take 1-2 tablets (5-10 mg total) by mouth at bedtime as needed for sleep.  60 tablet  0   No current facility-administered medications for this encounter.     ALLERGIES: Review of patient's allergies indicates no known allergies.   LABORATORY DATA:  Lab Results  Component Value Date   WBC 2.9* 04/29/2013   HGB 13.5 04/29/2013   HCT 40.8 04/29/2013   MCV 80.5 04/29/2013   PLT 137* 04/29/2013   Lab Results  Component Value Date   NA 137 04/29/2013   K 4.0 04/29/2013   CL 105 04/29/2013   CO2 24 04/29/2013   Lab Results  Component Value Date   ALT 18 04/29/2013   AST 14 04/29/2013   ALKPHOS 61 04/29/2013   BILITOT 0.54 04/29/2013     NARRATIVE: Joel Osborne was seen today for weekly treatment  management. The chart was checked and the patient's films were reviewed. The patient is clinically stable at this point. Some ongoing esophagitis which is managed with Carafate and liquid pain medication. The patient does feel quite fatigued.  PHYSICAL EXAMINATION: height is 6' (1.829 m) and weight is 224 lb 6.4 oz (101.787 kg). His temperature is 98.3 F (36.8 C). His blood pressure is 132/84 and his pulse is 108.        ASSESSMENT: The patient is doing satisfactorily with treatment.  PLAN: We will continue with the patient's radiation treatment as planned. Overall the patient is managing things pretty well. We have 8 more fractions remaining.

## 2013-05-02 NOTE — Progress Notes (Signed)
Mr. Breshears is here for his weekly under treat visit.  He has had 20/28 fractions to his esophagus.  He does have pain on swallowing that he is rating at a 4/10.  He is taking hycet three times a day and carafate 3 times a day which helps.  He has trouble swallowing dry foods like bread but is able to swallow liquids easily.  He did have nausea and dry heaves this morning. He does take compazine for this.  He also is very fatigued.  He denies skin irritation and uses radiaplex gel twice a day.

## 2013-05-04 ENCOUNTER — Other Ambulatory Visit: Payer: Self-pay | Admitting: Oncology

## 2013-05-06 ENCOUNTER — Ambulatory Visit (HOSPITAL_BASED_OUTPATIENT_CLINIC_OR_DEPARTMENT_OTHER): Payer: BC Managed Care – PPO | Admitting: Oncology

## 2013-05-06 ENCOUNTER — Telehealth: Payer: Self-pay | Admitting: Oncology

## 2013-05-06 ENCOUNTER — Ambulatory Visit (HOSPITAL_BASED_OUTPATIENT_CLINIC_OR_DEPARTMENT_OTHER): Payer: BC Managed Care – PPO

## 2013-05-06 ENCOUNTER — Other Ambulatory Visit: Payer: BC Managed Care – PPO | Admitting: Lab

## 2013-05-06 ENCOUNTER — Ambulatory Visit
Admission: RE | Admit: 2013-05-06 | Discharge: 2013-05-06 | Disposition: A | Discharge status: Home / Self Care | Payer: BC Managed Care – PPO | Source: Ambulatory Visit | Attending: Radiation Oncology | Admitting: Radiation Oncology

## 2013-05-06 ENCOUNTER — Ambulatory Visit: Payer: BC Managed Care – PPO | Admitting: Nutrition

## 2013-05-06 VITALS — BP 146/89 | HR 105 | Temp 98.6°F | Ht 72.0 in | Wt 223.4 lb

## 2013-05-06 DIAGNOSIS — Z5111 Encounter for antineoplastic chemotherapy: Secondary | ICD-10-CM

## 2013-05-06 DIAGNOSIS — C155 Malignant neoplasm of lower third of esophagus: Secondary | ICD-10-CM

## 2013-05-06 DIAGNOSIS — D696 Thrombocytopenia, unspecified: Secondary | ICD-10-CM

## 2013-05-06 DIAGNOSIS — R634 Abnormal weight loss: Secondary | ICD-10-CM

## 2013-05-06 LAB — CBC WITH DIFFERENTIAL/PLATELET
BASO%: 1.1 % (ref 0.0–2.0)
Eosinophils Absolute: 0.1 10*3/uL (ref 0.0–0.5)
MONO#: 0.3 10*3/uL (ref 0.1–0.9)
MONO%: 12 % (ref 0.0–14.0)
NEUT#: 2.1 10*3/uL (ref 1.5–6.5)
RBC: 5.06 10*6/uL (ref 4.20–5.82)
RDW: 15.6 % — ABNORMAL HIGH (ref 11.0–14.6)
WBC: 2.8 10*3/uL — ABNORMAL LOW (ref 4.0–10.3)
nRBC: 0 % (ref 0–0)

## 2013-05-06 MED ORDER — DEXAMETHASONE SODIUM PHOSPHATE 10 MG/ML IJ SOLN
10.0000 mg | Freq: Once | INTRAMUSCULAR | Status: AC
  Administered 2013-05-06: 10 mg via INTRAVENOUS

## 2013-05-06 MED ORDER — SODIUM CHLORIDE 0.9 % IV SOLN
50.0000 mg/m2 | Freq: Once | INTRAVENOUS | Status: AC
  Administered 2013-05-06: 114 mg via INTRAVENOUS
  Filled 2013-05-06: qty 19

## 2013-05-06 MED ORDER — SODIUM CHLORIDE 0.9 % IV SOLN
240.0000 mg | Freq: Once | INTRAVENOUS | Status: AC
  Administered 2013-05-06: 240 mg via INTRAVENOUS
  Filled 2013-05-06: qty 24

## 2013-05-06 MED ORDER — SODIUM CHLORIDE 0.9 % IV SOLN
Freq: Once | INTRAVENOUS | Status: AC
  Administered 2013-05-06: 09:00:00 via INTRAVENOUS

## 2013-05-06 MED ORDER — DIPHENHYDRAMINE HCL 50 MG/ML IJ SOLN
25.0000 mg | Freq: Once | INTRAMUSCULAR | Status: AC
  Administered 2013-05-06: 25 mg via INTRAVENOUS

## 2013-05-06 MED ORDER — ONDANSETRON 16 MG/50ML IVPB (CHCC)
16.0000 mg | Freq: Once | INTRAVENOUS | Status: AC
  Administered 2013-05-06: 16 mg via INTRAVENOUS

## 2013-05-06 MED ORDER — FAMOTIDINE IN NACL 20-0.9 MG/50ML-% IV SOLN
20.0000 mg | Freq: Once | INTRAVENOUS | Status: AC
  Administered 2013-05-06: 20 mg via INTRAVENOUS

## 2013-05-06 NOTE — Progress Notes (Signed)
Patient continues to report fatigue. His weight has declined to 223.4 pounds documented may 27th from 229.6 pounds documented may ninth. Patient states he has discontinued boost because he is eating more foods. He also reports metallic taste alterations. Today's patient's last chemotherapy. He finishes radiation shortly.  Nutrition diagnosis: Inadequate oral intake continues.  Intervention: Patient was educated on strategies for improving the taste of food. I have again encouraged him to increase oral intake and asked him to resume boost oral nutrition supplements to promote weight maintenance. Patient verbalizes understanding. Fact sheet provided. Teach back method used.  Monitoring, evaluation, and goals: Patient will increase oral intake along with oral nutrition supplements to promote weight stabilization.  Next visit: Patient will contact me if he has questions or develops new side effects.

## 2013-05-06 NOTE — Progress Notes (Signed)
Ok to treat with platelets 84 per Dr. Sherrill. 

## 2013-05-06 NOTE — Telephone Encounter (Signed)
s.w. pt wife and advised her to tell husband to come pick up new sched tomorrow at nxt radonc visit....ok and aware

## 2013-05-06 NOTE — Patient Instructions (Signed)
[facility] Discharge Instructions for Patients Receiving Chemotherapy  Today you received the following chemotherapy agents Taxol/Carboplatin  To help prevent nausea and vomiting after your treatment, we encourage you to take your nausea medication as prescribed.   If you develop nausea and vomiting that is not controlled by your nausea medication, call the clinic. If it is after clinic hours your family physician or the after hours number for the clinic or go to the Emergency Department.   BELOW ARE SYMPTOMS THAT SHOULD BE REPORTED IMMEDIATELY:  *FEVER GREATER THAN 100.5 F  *CHILLS WITH OR WITHOUT FEVER  NAUSEA AND VOMITING THAT IS NOT CONTROLLED WITH YOUR NAUSEA MEDICATION  *UNUSUAL SHORTNESS OF BREATH  *UNUSUAL BRUISING OR BLEEDING  TENDERNESS IN MOUTH AND THROAT WITH OR WITHOUT PRESENCE OF ULCERS  *URINARY PROBLEMS  *BOWEL PROBLEMS  UNUSUAL RASH Items with * indicate a potential emergency and should be followed up as soon as possible.  Feel free to call the clinic you have any questions or concerns. The clinic phone number is [phone].   I have been informed and understand all the instructions given to me. I know to contact the clinic, my physician, or go to the Emergency Department if any problems should occur. I do not have any questions at this time, but understand that I may call the clinic during office hours   should I have any questions or need assistance in obtaining follow up care.    __________________________________________  _____________  __________ Signature of Patient or Authorized Representative            Date                   Time    __________________________________________ Nurse's Signature    

## 2013-05-06 NOTE — Progress Notes (Signed)
   [facility]    OFFICE PROGRESS NOTE   INTERVAL HISTORY:   He returns as scheduled. He continues daily radiation and weekly Taxol/carboplatin. He changed to have solid dysphagia and discomfort with swallowing. Hydrocodone helps the pain. He is working. No neuropathy symptoms. He reports 2 days of "dry heaves ".   Objective:  Vital signs in last 24 hours:  Blood pressure 146/89, pulse 105, temperature 98.6 F (37 C), temperature source Oral, height 6' (1.829 m), weight 223 lb 6.4 oz (101.334 kg).    HEENT: No thrush or ulcers Resp:  Lungs clear bilaterally  Cardio: Regular rate and rhythm GI: No hepatomegaly, nontender Vascular: No leg edema  Skin: Mild radiation erythema at the upper back     Lab Results:  Lab Results  Component Value Date   WBC 2.8* 05/06/2013   HGB 13.5 05/06/2013   HCT 40.7 05/06/2013   MCV 80.4 05/06/2013   PLT 85 Platelet count confirmed by slide estimate* 05/06/2013   ANC 2.1    Medications: I have reviewed the patient's current medications.  Assessment/Plan: 1.Adenocarcinoma of the distal esophagus/gastric cardia-status post an endoscopic biopsy on 03/18/2013 confirming adenocarcinoma  -Staging CT scans 03/20/2013 confirmed a distal esophageal/gastric cardia mass, distal paraesophageal lymph nodes and paratracheal nodes concerning for metastatic lymphadenopathy  -A PET scan 03/27/2013 with a multifocal area of hypermetabolic activity involving the thoracic esophagus extending into the gastric cardia and hypermetabolic mediastinal nodes, no distant metastatic disease  -Initiation of radiation 04/07/2013 and weekly Taxol/carboplatin 04/08/2013  2. Indeterminate 15 mm right liver lesion on the CT scan 03/20/2013  3. Anorexia/weight loss and solid dysphagia secondary to #1 4. History of a colon polyp, status post a polypectomy October 2013  5. Thrombocytopenia secondary chemotherapy-the carboplatin will be dose reduced with  chemotherapy today. He will contact us for spontaneous bleeding or bruising   Disposition:  He will continue daily radiation. He will complete the last cycle of weekly Taxol/carboplatin today. We will check a platelet count next week. He will return for an office visit on 05/20/2013. The plan is to refer him for a restaging x-ray evaluation a few weeks after the completion of chemotherapy/radiation.   Thornton Papas, MD  05/06/2013  5:53 PM

## 2013-05-07 ENCOUNTER — Ambulatory Visit
Admission: RE | Admit: 2013-05-07 | Discharge: 2013-05-07 | Disposition: A | Discharge status: Home / Self Care | Payer: BC Managed Care – PPO | Source: Ambulatory Visit | Attending: Radiation Oncology | Admitting: Radiation Oncology

## 2013-05-08 ENCOUNTER — Ambulatory Visit
Admission: RE | Admit: 2013-05-08 | Discharge: 2013-05-08 | Disposition: A | Discharge status: Home / Self Care | Payer: BC Managed Care – PPO | Source: Ambulatory Visit | Attending: Radiation Oncology | Admitting: Radiation Oncology

## 2013-05-08 ENCOUNTER — Telehealth: Payer: Self-pay | Admitting: Oncology

## 2013-05-08 NOTE — Telephone Encounter (Signed)
Gave pt appt for lab, Md and chemo for may and June 2014

## 2013-05-09 ENCOUNTER — Ambulatory Visit
Admission: RE | Admit: 2013-05-09 | Discharge: 2013-05-09 | Disposition: A | Discharge status: Home / Self Care | Payer: BC Managed Care – PPO | Source: Ambulatory Visit | Attending: Radiation Oncology | Admitting: Radiation Oncology

## 2013-05-09 ENCOUNTER — Encounter: Payer: Self-pay | Admitting: Radiation Oncology

## 2013-05-09 ENCOUNTER — Other Ambulatory Visit (HOSPITAL_BASED_OUTPATIENT_CLINIC_OR_DEPARTMENT_OTHER): Payer: BC Managed Care – PPO

## 2013-05-09 VITALS — BP 136/91 | HR 106 | Temp 98.5°F | Resp 20 | Wt 224.5 lb

## 2013-05-09 DIAGNOSIS — C155 Malignant neoplasm of lower third of esophagus: Secondary | ICD-10-CM

## 2013-05-09 LAB — CBC WITH DIFFERENTIAL/PLATELET
BASO%: 0.6 % (ref 0.0–2.0)
HCT: 37.8 % — ABNORMAL LOW (ref 38.4–49.9)
HGB: 12.7 g/dL — ABNORMAL LOW (ref 13.0–17.1)
MCHC: 33.6 g/dL (ref 32.0–36.0)
MONO#: 0.3 10*3/uL (ref 0.1–0.9)
NEUT%: 69.2 % (ref 39.0–75.0)
RDW: 15.7 % — ABNORMAL HIGH (ref 11.0–14.6)
WBC: 1.7 10*3/uL — ABNORMAL LOW (ref 4.0–10.3)
lymph#: 0.2 10*3/uL — ABNORMAL LOW (ref 0.9–3.3)

## 2013-05-09 NOTE — Progress Notes (Signed)
Pt reports fatigue, pain "at back of throat'. He states he "eats what he can". Pt denies nausea, esophagitis, acid reflux. He is applying Radiaplex to chest, has redness, denies itching.

## 2013-05-09 NOTE — Progress Notes (Signed)
   Department of Radiation Oncology  Phone:  [phone] Fax:        [phone]  Weekly Treatment Note    Name: Joel Osborne Date: 05/09/2013 MRN: [id number] DOB: 1966/03/17   Current dose: 43.2 Gy  Current fraction: 24   MEDICATIONS: Current Outpatient Prescriptions  Medication Sig Dispense Refill  . atorvastatin (LIPITOR) 20 MG tablet Take 20 mg by mouth daily.       . Canagliflozin (INVOKANA PO) Take 1 tablet by mouth daily.      . hyaluronate sodium (RADIAPLEXRX) GEL Apply 1 application topically 2 (two) times daily.      Marland Kitchen HYDROcodone-acetaminophen (HYCET) 7.5-325 mg/15 ml solution Take 15 mLs by mouth 4 (four) times daily as needed for pain.  600 mL  0  . ibuprofen (ADVIL,MOTRIN) 200 MG tablet Take 200 mg by mouth every 6 (six) hours as needed for pain.      . Multiple Vitamins-Minerals (MULTIVITAMIN WITH MINERALS) tablet Take 2 tablets by mouth daily. Chewable MVI      . prochlorperazine (COMPAZINE) 10 MG tablet Take 1 tablet (10 mg total) by mouth every 6 (six) hours as needed (nausea).  60 tablet  2  . sucralfate (CARAFATE) 1 G tablet Take 1 tablet (1 g total) by mouth 4 (four) times daily. Dissolve in 15 cc water.  120 tablet  1  . zolpidem (AMBIEN) 5 MG tablet Take 1-2 tablets (5-10 mg total) by mouth at bedtime as needed for sleep.  60 tablet  0   No current facility-administered medications for this encounter.     ALLERGIES: Review of patient's allergies indicates no known allergies.   LABORATORY DATA:  Lab Results  Component Value Date   WBC 2.8* 05/06/2013   HGB 13.5 05/06/2013   HCT 40.7 05/06/2013   MCV 80.4 05/06/2013   PLT 85 Platelet count confirmed by slide estimate* 05/06/2013   Lab Results  Component Value Date   NA 137 04/29/2013   K 4.0 04/29/2013   CL 105 04/29/2013   CO2 24 04/29/2013   Lab Results  Component Value Date   ALT 18 04/29/2013   AST 14 04/29/2013   ALKPHOS 61 04/29/2013   BILITOT 0.54 04/29/2013     NARRATIVE: Joel Osborne  [name] was seen today for weekly treatment management. The chart was checked and the patient's films were reviewed. The patient is doing well. Clinically he is stable. Continued esophagitis as expected. He is using skin cream daily.  PHYSICAL EXAMINATION: weight is 224 lb 8 oz (101.833 kg). His oral temperature is 98.5 F (36.9 C). His blood pressure is 136/91 and his pulse is 106. His respiration is 20.        ASSESSMENT: The patient is doing satisfactorily with treatment.  PLAN: We will continue with the patient's radiation treatment as planned.

## 2013-05-12 ENCOUNTER — Ambulatory Visit
Admission: RE | Admit: 2013-05-12 | Discharge: 2013-05-12 | Disposition: A | Discharge status: Home / Self Care | Payer: BC Managed Care – PPO | Source: Ambulatory Visit | Attending: Radiation Oncology | Admitting: Radiation Oncology

## 2013-05-13 ENCOUNTER — Ambulatory Visit
Admission: RE | Admit: 2013-05-13 | Discharge: 2013-05-13 | Disposition: A | Discharge status: Home / Self Care | Payer: BC Managed Care – PPO | Source: Ambulatory Visit | Attending: Radiation Oncology | Admitting: Radiation Oncology

## 2013-05-13 ENCOUNTER — Other Ambulatory Visit: Payer: Self-pay | Admitting: *Deleted

## 2013-05-13 ENCOUNTER — Other Ambulatory Visit (HOSPITAL_BASED_OUTPATIENT_CLINIC_OR_DEPARTMENT_OTHER): Payer: BC Managed Care – PPO

## 2013-05-13 DIAGNOSIS — C155 Malignant neoplasm of lower third of esophagus: Secondary | ICD-10-CM

## 2013-05-13 LAB — CBC WITH DIFFERENTIAL/PLATELET
Basophils Absolute: 0 10*3/uL (ref 0.0–0.1)
Eosinophils Absolute: 0 10*3/uL (ref 0.0–0.5)
HCT: 37.1 % — ABNORMAL LOW (ref 38.4–49.9)
HGB: 12.3 g/dL — ABNORMAL LOW (ref 13.0–17.1)
MCH: 27 pg — ABNORMAL LOW (ref 27.2–33.4)
MONO#: 0.3 10*3/uL (ref 0.1–0.9)
NEUT#: 1.1 10*3/uL — ABNORMAL LOW (ref 1.5–6.5)
NEUT%: 68.3 % (ref 39.0–75.0)
RDW: 16 % — ABNORMAL HIGH (ref 11.0–14.6)
WBC: 1.6 10*3/uL — ABNORMAL LOW (ref 4.0–10.3)
lymph#: 0.1 10*3/uL — ABNORMAL LOW (ref 0.9–3.3)

## 2013-05-14 ENCOUNTER — Ambulatory Visit
Admission: RE | Admit: 2013-05-14 | Discharge: 2013-05-14 | Disposition: A | Discharge status: Home / Self Care | Payer: BC Managed Care – PPO | Source: Ambulatory Visit | Attending: Radiation Oncology | Admitting: Radiation Oncology

## 2013-05-15 ENCOUNTER — Ambulatory Visit (INDEPENDENT_AMBULATORY_CARE_PROVIDER_SITE_OTHER): Payer: BC Managed Care – PPO | Admitting: Cardiothoracic Surgery

## 2013-05-15 ENCOUNTER — Ambulatory Visit
Admission: RE | Admit: 2013-05-15 | Discharge: 2013-05-15 | Disposition: A | Discharge status: Home / Self Care | Payer: BC Managed Care – PPO | Source: Ambulatory Visit | Attending: Radiation Oncology | Admitting: Radiation Oncology

## 2013-05-15 ENCOUNTER — Encounter: Payer: Self-pay | Admitting: Cardiothoracic Surgery

## 2013-05-15 ENCOUNTER — Encounter: Payer: Self-pay | Admitting: Radiation Oncology

## 2013-05-15 VITALS — BP 125/85 | HR 105 | Resp 20 | Ht 72.0 in | Wt 224.0 lb

## 2013-05-15 VITALS — BP 129/88 | HR 109 | Temp 99.6°F | Ht 72.0 in | Wt 221.3 lb

## 2013-05-15 DIAGNOSIS — C159 Malignant neoplasm of esophagus, unspecified: Secondary | ICD-10-CM

## 2013-05-15 DIAGNOSIS — C155 Malignant neoplasm of lower third of esophagus: Secondary | ICD-10-CM

## 2013-05-15 NOTE — Progress Notes (Signed)
[address]            [place] [address]          [phone]      Joel Osborne [place] Medical Record #[id number] Date of Birth: May 08, 1966  Referring: Jarome Matin, MD Primary Care: Garlan Fillers, MD  Chief Complaint:    Chief Complaint  Patient presents with  . Esophageal Cancer    f/u after completion of chemo and radiation, surgical eval for esophageal cancer    History of Present Illness:    Patient is 47 yo who  began an intentional weight loss program at Thanksgiving of last year and has lost greater than 30 pounds. He now notes   a 3 to four-month history of solid dysphagia.He continues to lose weight. He saw Dr. Jarold Motto and an esophagram on 03/06/2013 revealed a large irregular filling defect within the distal esophagus a second smaller persistent filling defect was noted in the mid to distal thoracic esophagus below the level of the carina.    An upper endoscopy on 03/18/2013 revealed a bulky friable mass at the distal 6 cm of the esophagus and involving a gastric cardia. The mass was obstructive. Retroflexed view in the stomach revealed the ulcerative mass in the cardia. The remainder of the stomach was normal. The duodenal bulb and postbulbar duodenum are normal. A biopsy confirmed adenocarcinoma. HER-2/NEU BY FISH - NO AMPLIFICATION OF HER-2 DETECTED. Accession: [id number] He was referred for staging CTs of the chest, abdomen, and pelvis on 03/20/2013. A lower right paratracheal node measured 11 mm. A high right paratracheal node measured 8 mm. Small left periaortic nodes were also seen. No evidence of pulmonary metastases. An irregular mass was noted at the distal esophagus. 9 mm lymph node adjacent to the distal esophagus. No significant adenopathy in the gastrohepatic ligament. Small periportal nodes were felt to be benign. A low-density lesion in the inferior right liver measured 15 mm and had peripheral enhancement.    The patient now has completed course of chemotherapy on May 27, today is his last day of radiation therapy. Is been tolerating therapy relatively well, and has been able to maintain his nutritional status. He notes that next week he is to see Dr. Truett Perna to arrange restaging scans.      Current Activity/ Functional Status:  Patient is independent with mobility/ambulation, transfers, ADL's, IADL's.  Zubrod Score: At the time of surgery this patient's most appropriate activity status/level should be described as: []  Normal activity, no symptoms [x]  Symptoms, fully ambulatory []  Symptoms, in bed less than or equal to 50% of the time []  Symptoms, in bed greater than 50% of the time but less than 100% []  Bedridden []  Moribund   Past Medical History  Diagnosis Date  . Hyperlipemia   . Colon polyp   . Pre-diabetes   . 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 GERD (gastroesophageal reflux disease)     Past Surgical History  Procedure Laterality Date  . Appendectomy    . Lipoma excision    . Edg  03/18/2013    Esophageal Mass    Family History  Problem Relation Age of Onset  . Colon cancer Mother   . Diabetes Mother   . Breast cancer Maternal Aunt   . Diabetes Maternal Aunt   . Esophageal cancer Maternal Aunt     History  Social History  . Marital Status: Married    Spouse Name: N/A    Number of Children: 2  . Years of Education: N/A   Occupational History  . Chiropractor    Social History Main Topics  . Smoking status: Never Smoker   . Smokeless tobacco: Never Used  . Alcohol Use: 1.2 oz/week    2 Cans of beer per week  . Drug Use: No  . Sexually Active: Not on file   Other Topics Concern  . Not on file   Social History Narrative  . No narrative on file    History  Smoking status  . Never Smoker   Smokeless tobacco  . Never Used    History  Alcohol Use  . 1.2 oz/week  . 2 Cans of beer per week     No Known Allergies  Current  Outpatient Prescriptions  Medication Sig Dispense Refill  . atorvastatin (LIPITOR) 20 MG tablet Take 20 mg by mouth daily.       . Canagliflozin (INVOKANA PO) Take 1 tablet by mouth daily.      . hyaluronate sodium (RADIAPLEXRX) GEL Apply 1 application topically 2 (two) times daily.      Marland Kitchen HYDROcodone-acetaminophen (HYCET) 7.5-325 mg/15 ml solution Take 15 mLs by mouth 4 (four) times daily as needed for pain.  600 mL  0  . ibuprofen (ADVIL,MOTRIN) 200 MG tablet Take 200 mg by mouth every 6 (six) hours as needed for pain.      . Multiple Vitamins-Minerals (MULTIVITAMIN WITH MINERALS) tablet Take 2 tablets by mouth daily. Chewable MVI      . prochlorperazine (COMPAZINE) 10 MG tablet Take 1 tablet (10 mg total) by mouth every 6 (six) hours as needed (nausea).  60 tablet  2  . sucralfate (CARAFATE) 1 G tablet Take 1 tablet (1 g total) by mouth 4 (four) times daily. Dissolve in 15 cc water.  120 tablet  1  . zolpidem (AMBIEN) 5 MG tablet Take 1-2 tablets (5-10 mg total) by mouth at bedtime as needed for sleep.  60 tablet  0   No current facility-administered medications for this visit.       Review of Systems:     Cardiac Review of Systems: Y or N  Chest Pain [ n   ]  Resting SOB [  n ] Exertional SOB  [n  ]  Orthopnea [ n ]   Pedal Edema [ n  ]    Palpitations [ n ] Syncope  [ n ]   Presyncope [n   ]  General Review of Systems: [Y] = yes [  ]=no Constitional: recent weight change Cove.Etienne  ]; anorexia [ y ]; fatigue [  ]; nausea [  ]; night sweats [  ]; fever [  ]; or chills [  ];  Dental: poor dentition[ n ]; Last Dentist visit:   Eye : blurred vision [  ]; diplopia [   ]; vision changes [  ];  Amaurosis fugax[  ]; Resp: cough [  ];  wheezing[  ];  hemoptysis[  ]; shortness of breath[  ]; paroxysmal nocturnal dyspnea[  ]; dyspnea on exertion[  ]; or orthopnea[  ];    GI:  gallstones[  ], vomiting[  ];  dysphagia[ y ]; melena[ n ];  hematochezia [n  ]; heartburn[n  ];   Hx of  Colonoscopy[y  ]; GU: kidney stones [  ]; hematuria[  ];   dysuria [  ];  nocturia[  ];  history of     obstruction [  ]; urinary frequency [  ]             Skin: rash, swelling[  ];, hair loss[  ];  peripheral edema[  ];  or itching[  ]; Musculosketetal: myalgias[  ];  joint swelling[  ];  joint erythema[  ];  joint pain[  ];  back pain[  ];  Heme/Lymph: bruising[  ];  bleeding[  ];  anemia[  ];  Neuro: TIA[  ];  headaches[  ];  stroke[  ];  vertigo[  ];  seizures[  ];   paresthesias[  ];  difficulty walking[  ];  Psych:depression[  ]; anxiety[  ];  Endocrine: diabetes[  ];  thyroid dysfunction[  ];  Immunizations: Flu [ n ]; Pneumococcal[ n ];  Other:  Physical Exam: BP 125/85  Pulse 105  Resp 20  Ht 6' (1.829 m)  Wt 224 lb (101.606 kg)  BMI 30.37 kg/m2  SpO2 98%  General appearance: alert, cooperative, appears stated age and no distress Neurologic: intact Heart: regular rate and rhythm, S1, S2 normal, no murmur, click, rub or gallop and normal apical impulse Lungs: clear to auscultation bilaterally and normal percussion bilaterally Abdomen: soft, non-tender; bowel sounds normal; no masses,  no organomegaly Extremities: extremities normal, atraumatic, no cyanosis or edema, Homans sign is negative, no sign of DVT, no edema, redness or tenderness in the calves or thighs and no ulcers, gangrene or trophic changes   Diagnostic Studies & Laboratory data:    Ct Chest W Contrast  03/20/2013  *RADIOLOGY REPORT*  Clinical Data:  Esophageal mass, rule out metastasis.  Difficulty swallowing.  CT CHEST, ABDOMEN AND PELVIS WITH CONTRAST  Technique:  Multidetector CT imaging of the chest, abdomen and pelvis was performed following the standard protocol during bolus administration of intravenous contrast.  Contrast: OMNIPAQUE IOHEXOL 300 MG/ML  SOLN,  Comparison:  Esophagram  03/06/2013  CT CHEST  Findings:  No axillary or supraclavicular lymphadenopathy.  Right lower paratracheal lymph node measures 11 mm short axis (image 24). High right paratracheal lymph node measures 8 mm (image #11). Small left periaortic lymph nodes are also noted (image 20).  No subcarinal adenopathy.  No pericardial fluid.  Review of the lung parenchyma demonstrates no suspicious pulmonary nodules.  IMPRESSION:  1.  Paratracheal mediastinal  lymph nodes on the left and right while small are concerning for nodal metastasis. 2.  No evidence of pulmonary metastasis.  CT ABDOMEN AND PELVIS  Findings:  There is irregular mass in the distal esophagus measuring 3.3 x 3.0 cm.  There is some thickening within the gastric cardiac region enhancement (image 53).  There is a 9 mm lymph node adjacent to the distal esophagus on the right (image 53).  No significant adenopathy in  the gastrohepatic ligament. Small periportal lymph nodes are less than 10 mm and likely benign.  There is a low density lesion in the inferior right hepatic lobe measuring 15 mm (image 67). This has peripheral enhancement.  The gallbladder, pancreas, spleen, adrenal glands show no acute findings.  There are gallstones in the gallbladder.  The duodenum, small bowel, appendix, and cecum are normal.  The colon and rectosigmoid colon are normal.  Abdominal aorta normal caliber.  No retroperitoneal lymphadenopathy  No free fluid the pelvis.  The prostate gland bladder normal.  No pelvic lymphadenopathy. Review of  bone windows demonstrates no aggressive osseous lesions.  IMPRESSION:  1.  Distal esophageal mass and thickened gastric cardia concerning for primary esophageal/gastric carcinoma. 2.  Small distal para esophageal lymph nodes are concerning for local nodal metastasis. 3.  No evidence of periportal metastasis.  4.  Small hypodense lesion in the right hepatic lobe may represent benign hemangioma but is not fully characterized.  Further  characterization could be achieved with contrast MRI or FDG PET scan.  5.  Please see chest section for concern for paratracheal nodal metastasis.   Original Report Authenticated By: Genevive Bi, M.D.    Ct Abdomen Pelvis W Contrast  03/20/2013  *RADIOLOGY REPORT*  Clinical Data:  Esophageal mass, rule out metastasis.  Difficulty swallowing.  CT CHEST, ABDOMEN AND PELVIS WITH CONTRAST  Technique:  Multidetector CT imaging of the chest, abdomen and pelvis was performed following the standard protocol during bolus administration of intravenous contrast.  Contrast: OMNIPAQUE IOHEXOL 300 MG/ML  SOLN,  Comparison:  Esophagram 03/06/2013  CT CHEST  Findings:  No axillary or supraclavicular lymphadenopathy.  Right lower paratracheal lymph node measures 11 mm short axis (image 24). High right paratracheal lymph node measures 8 mm (image #11). Small left periaortic lymph nodes are also noted (image 20).  No subcarinal adenopathy.  No pericardial fluid.  Review of the lung parenchyma demonstrates no suspicious pulmonary nodules.  IMPRESSION:  1.  Paratracheal mediastinal  lymph nodes on the left and right while small are concerning for nodal metastasis. 2.  No evidence of pulmonary metastasis.  CT ABDOMEN AND PELVIS  Findings:  There is irregular mass in the distal esophagus measuring 3.3 x 3.0 cm.  There is some thickening within the gastric cardiac region enhancement (image 53).  There is a 9 mm lymph node adjacent to the distal esophagus on the right (image 53).  No significant adenopathy in the gastrohepatic ligament. Small periportal lymph nodes are less than 10 mm and likely benign.  There is a low density lesion in the inferior right hepatic lobe measuring 15 mm (image 67). This has peripheral enhancement.  The gallbladder, pancreas, spleen, adrenal glands show no acute findings.  There are gallstones in the gallbladder.  The duodenum, small bowel, appendix, and cecum are normal.  The colon and  rectosigmoid colon are normal.  Abdominal aorta normal caliber.  No retroperitoneal lymphadenopathy  No free fluid the pelvis.  The prostate gland bladder normal.  No pelvic lymphadenopathy. Review of  bone windows demonstrates no aggressive osseous lesions.  IMPRESSION:  1.  Distal esophageal mass and thickened gastric cardia concerning for primary esophageal/gastric carcinoma. 2.  Small distal para esophageal lymph nodes are concerning for local nodal metastasis. 3.  No evidence of periportal metastasis.  4.  Small hypodense lesion in the right hepatic lobe may represent benign hemangioma but is not fully characterized.  Further characterization could be achieved with contrast MRI or FDG  PET scan.  5.  Please see chest section for concern for paratracheal nodal metastasis.   Original Report Authenticated By: Genevive Bi, M.D.    Dg Esophagus  03/06/2013  *RADIOLOGY REPORT*  Clinical Data:Dysphagia.  ESOPHAGUS/BARIUM SWALLOW/TABLET STUDY  Fluoroscopy Time: 2.7 minutes.  Comparison: None.  Findings: Double contrast esophagram demonstrates a large irregular filling defect within the distal esophagus.  There is a second smaller persistent filling defect within the mid to distal thoracic esophagus just below the level of the carina.  Findings are concerning for masses/neoplasm.  Cannot completely exclude a component of retained/impacted food.  Contrast material does pass around this mass-like filling defect.  Normal esophageal motility.  IMPRESSION: Large filling defect in the distal esophagus.  Smaller persistent filling defect more proximally in the mid to distal esophagus. Cannot exclude neoplasm.  Recommend further evaluation with endoscopy.  These results were discussed with Dr. Jarold Motto at the time of interpretation.   Original Report Authenticated By: Charlett Nose, M.D.    Nm Pet Image Initial (pi) Skull Base To Thigh  03/27/2013  *RADIOLOGY REPORT*  Clinical Data: Initial treatment strategy for  esophageal cancer.  NUCLEAR MEDICINE PET SKULL BASE TO THIGH  Fasting Blood Glucose:  84  Technique:  18 mCi F-18 FDG was injected intravenously. CT data was obtained and used for attenuation correction and anatomic localization only.  (This was not acquired as a diagnostic CT examination.) Additional exam technical data entered on technologist worksheet.  Comparison:  03/20/2013  Findings:  Neck: No hypermetabolic lymph nodes in the neck.  Chest:  There is a new right paratracheal lymph node which measures 9 mm.  The SUV max associated with this lymph node is not well to 4.8.  Increased FDG uptake associated with the left paratracheal lymph node has an SUV max equal to 6.7, image 79.  Posterior to the left mainstem bronchus is a short segment of intense FDG uptake associated with the esophagus.  The SUV max is equal to 8.9, image 89.   Within the distal portion of the esophagus there is a long segment of abnormal increased FDG uptake which extends into the gastric cardia.  This segment measures approximately 8.5 cm, and has an SUV max equal to 12.3.  At this level there is marked thickening of the esophageal wall.  Abdomen/Pelvis:  No abnormal hypermetabolic activity within the liver, pancreas, adrenal glands, or spleen.  No hypermetabolic lymph nodes in the abdomen or pelvis. Stones are noted within the dependent portion of the gallbladder.  Skeleton:  No focal hypermetabolic activity to suggest skeletal metastasis.  IMPRESSION:  1.  Multi focal area of hypermetabolic tumor involves the thoracic esophagus. Majority of the tumor involves the distal 8 cm of the esophagus.  2.  Hypermetabolic lymph nodes within the mediastinum which are worrisome for metastatic adenopathy. 3.  No evidence for distant metastatic disease.   Original Report Authenticated By: Signa Kell, M.D.     Recent Lab Findings: Lab Results  Component Value Date   WBC 1.6* 05/13/2013      Assessment / Plan:     Advanced stage  adenocarcinoma of distal esophageal, involving proximal stomach, likely nodal involvement. Patient will need repeat scans, with particular attention to the questionable liver lesion noted previously.  I'll plan to see him back in 3 weeks after restaging scans are completed. I spent 30 minutes today discussing with the patient and his wife the procedure of esophagectomy. If there is no evidence of distant metastasis on followup  scans we'll proceed with esophagectomy in late July or early August.   Delight Ovens MD  Beeper [phone] Office [phone] 05/15/2013 1:15 PM

## 2013-05-15 NOTE — Progress Notes (Signed)
Joel Osborne here for his final treatment visit.  He has had 28 fraction to his esophagus.  He states he has pain with swallowing that he is rating at a 5/10.  He stated that it was an 8/10 on Tuesday.  He is able to tolerate lukewarm liquids.  He states that solids are really tough to swallow.  He is using the hycet and carafate.  He is very fatigued.  He does have two small open areas on his chest and his upper back.  He is using radiaplex gel twice a day.

## 2013-05-15 NOTE — Progress Notes (Signed)
   Department of Radiation Oncology  Phone:  [phone] Fax:        [phone]  Weekly Treatment Note    Name: [name] Date: 05/15/2013 MRN: [id number] DOB: September 12, 1966   Current dose: 50.4 Gy  Current fraction: 28   MEDICATIONS: Current Outpatient Prescriptions  Medication Sig Dispense Refill  . atorvastatin (LIPITOR) 20 MG tablet Take 20 mg by mouth daily.       . Canagliflozin (INVOKANA PO) Take 1 tablet by mouth daily.      . hyaluronate sodium (RADIAPLEXRX) GEL Apply 1 application topically 2 (two) times daily.      Marland Kitchen HYDROcodone-acetaminophen (HYCET) 7.5-325 mg/15 ml solution Take 15 mLs by mouth 4 (four) times daily as needed for pain.  600 mL  0  . ibuprofen (ADVIL,MOTRIN) 200 MG tablet Take 200 mg by mouth every 6 (six) hours as needed for pain.      . Multiple Vitamins-Minerals (MULTIVITAMIN WITH MINERALS) tablet Take 2 tablets by mouth daily. Chewable MVI      . prochlorperazine (COMPAZINE) 10 MG tablet Take 1 tablet (10 mg total) by mouth every 6 (six) hours as needed (nausea).  60 tablet  2  . sucralfate (CARAFATE) 1 G tablet Take 1 tablet (1 g total) by mouth 4 (four) times daily. Dissolve in 15 cc water.  120 tablet  1  . zolpidem (AMBIEN) 5 MG tablet Take 1-2 tablets (5-10 mg total) by mouth at bedtime as needed for sleep.  60 tablet  0   No current facility-administered medications for this encounter.     ALLERGIES: Review of patient's allergies indicates no known allergies.   LABORATORY DATA:  Lab Results  Component Value Date   WBC 1.6* 05/13/2013   HGB 12.3* 05/13/2013   HCT 37.1* 05/13/2013   MCV 81.5 05/13/2013   PLT 74* 05/13/2013   Lab Results  Component Value Date   NA 137 04/29/2013   K 4.0 04/29/2013   CL 105 04/29/2013   CO2 24 04/29/2013   Lab Results  Component Value Date   ALT 18 04/29/2013   AST 14 04/29/2013   ALKPHOS 61 04/29/2013   BILITOT 0.54 04/29/2013     NARRATIVE: Joel Osborne was seen today for weekly treatment  management. The chart was checked and the patient's films were reviewed. The patient completed his treatment today. He is very pleased with this. He had increased esophagitis a couple of days ago but this has been thought a little bit.  PHYSICAL EXAMINATION: height is 6' (1.829 m) and weight is 221 lb 4.8 oz (100.381 kg). His temperature is 99.6 F (37.6 C). His blood pressure is 129/88 and his pulse is 109.        ASSESSMENT: The patient did satisfactorily during treatment. Expected esophagitis. He is managing this with pain medicine and Carafate.  PLAN: He will continue his current medications. I will see him in one month.

## 2013-05-20 ENCOUNTER — Ambulatory Visit (HOSPITAL_BASED_OUTPATIENT_CLINIC_OR_DEPARTMENT_OTHER): Payer: BC Managed Care – PPO | Admitting: Oncology

## 2013-05-20 ENCOUNTER — Telehealth: Payer: Self-pay | Admitting: Oncology

## 2013-05-20 ENCOUNTER — Other Ambulatory Visit (HOSPITAL_BASED_OUTPATIENT_CLINIC_OR_DEPARTMENT_OTHER): Payer: BC Managed Care – PPO | Admitting: Lab

## 2013-05-20 VITALS — BP 123/80 | HR 96 | Temp 98.1°F | Resp 18 | Ht 72.0 in | Wt 216.0 lb

## 2013-05-20 DIAGNOSIS — C155 Malignant neoplasm of lower third of esophagus: Secondary | ICD-10-CM

## 2013-05-20 DIAGNOSIS — K7689 Other specified diseases of liver: Secondary | ICD-10-CM

## 2013-05-20 LAB — CBC WITH DIFFERENTIAL/PLATELET
Basophils Absolute: 0 10*3/uL (ref 0.0–0.1)
Eosinophils Absolute: 0 10*3/uL (ref 0.0–0.5)
HCT: 36.7 % — ABNORMAL LOW (ref 38.4–49.9)
HGB: 12.4 g/dL — ABNORMAL LOW (ref 13.0–17.1)
LYMPH%: 9.6 % — ABNORMAL LOW (ref 14.0–49.0)
MCV: 81.6 fL (ref 79.3–98.0)
MONO#: 0.4 10*3/uL (ref 0.1–0.9)
MONO%: 19.2 % — ABNORMAL HIGH (ref 0.0–14.0)
NEUT#: 1.5 10*3/uL (ref 1.5–6.5)
Platelets: 169 10*3/uL (ref 140–400)
WBC: 2.1 10*3/uL — ABNORMAL LOW (ref 4.0–10.3)

## 2013-05-20 NOTE — Telephone Encounter (Signed)
gv and printed apt sched and avs for pt...pt okand aware cs will contact with d/t for MRI

## 2013-05-20 NOTE — Progress Notes (Signed)
   [facility]    OFFICE PROGRESS NOTE   INTERVAL HISTORY:   He completed a final week of Taxol/carboplatin on 05/06/2013. He continues to complain of odynophagia. The pain is relieved with hydrocodone. His oral intake has been limited.he completed radiation on 05/15/2013. He complains of malaise. He continues to work.  Objective:  Vital signs in last 24 hours:  Blood pressure 123/80, pulse 96, temperature 98.1 F (36.7 C), temperature source Oral, resp. rate 18, height 6' (1.829 m), weight 216 lb (97.977 kg).    HEENT: no thrush or ulcers Lymphatics: no cervical, supraclavicular, or axillary nodes Resp: lungs clear bilaterally Cardio: regular rate and rhythm GI: no hepatomegaly Vascular: no leg edema   Lab Results:  Lab Results  Component Value Date   WBC 2.1* 05/20/2013   HGB 12.4* 05/20/2013   HCT 36.7* 05/20/2013   MCV 81.6 05/20/2013   PLT 169 05/20/2013   ANC 1.5   Medications: I have reviewed the patient's current medications.  Assessment/Plan: 1.Adenocarcinoma of the distal esophagus/gastric cardia-status post an endoscopic biopsy on 03/18/2013 confirming adenocarcinoma  -Staging CT scans 03/20/2013 confirmed a distal esophageal/gastric cardia mass, distal paraesophageal lymph nodes and paratracheal nodes concerning for metastatic lymphadenopathy  -A PET scan 03/27/2013 with a multifocal area of hypermetabolic activity involving the thoracic esophagus extending into the gastric cardia and hypermetabolic mediastinal nodes, no distant metastatic disease  -Initiation of radiation 04/07/2013 and weekly Taxol/carboplatin 04/08/2013, radiation completed 05/15/2013  2. Indeterminate 15 mm right liver lesion on the CT scan 03/20/2013  3. Anorexia/weight loss and solid dysphagia secondary to #1  4. History of a colon polyp, status post a polypectomy October 2013  5. Thrombocytopenia secondary chemotherapy-the carboplatin wase dose reduced with week 5 of  chemotherapy chemotherapy   Disposition:  He has completed neoadjuvant therapy. The odynophagia should improve over the next few weeks.He will be scheduled for an MRI of the liver to evaluate the indeterminate lesion seen on the 03/20/2013 CT. I will discuss the indication for a restaging PET scan or chest CT with Dr. Tyrone Sage.  He will be scheduled for an office visit 06/10/2013.   Thornton Papas, MD  05/20/2013  9:11 PM

## 2013-05-21 ENCOUNTER — Other Ambulatory Visit: Payer: Self-pay | Admitting: *Deleted

## 2013-05-21 DIAGNOSIS — C155 Malignant neoplasm of lower third of esophagus: Secondary | ICD-10-CM

## 2013-05-21 MED ORDER — HYDROCODONE-ACETAMINOPHEN 7.5-325 MG/15ML PO SOLN: 15.0000 mL | Freq: Four times a day (QID) | ORAL | Status: DC | PRN

## 2013-05-21 NOTE — Telephone Encounter (Signed)
Message from pt requesting refill on Hydrocodone. Order received from MD, called to pharmacy.

## 2013-05-28 ENCOUNTER — Ambulatory Visit: Payer: BC Managed Care – PPO | Admitting: Cardiothoracic Surgery

## 2013-05-29 ENCOUNTER — Ambulatory Visit (HOSPITAL_BASED_OUTPATIENT_CLINIC_OR_DEPARTMENT_OTHER): Payer: BC Managed Care – PPO | Admitting: Genetic Counselor

## 2013-05-29 ENCOUNTER — Other Ambulatory Visit: Payer: BC Managed Care – PPO | Admitting: Lab

## 2013-05-29 ENCOUNTER — Ambulatory Visit (INDEPENDENT_AMBULATORY_CARE_PROVIDER_SITE_OTHER): Payer: BC Managed Care – PPO | Admitting: Cardiothoracic Surgery

## 2013-05-29 ENCOUNTER — Other Ambulatory Visit: Payer: Self-pay | Admitting: *Deleted

## 2013-05-29 ENCOUNTER — Encounter: Payer: Self-pay | Admitting: Genetic Counselor

## 2013-05-29 ENCOUNTER — Telehealth: Payer: Self-pay | Admitting: Oncology

## 2013-05-29 ENCOUNTER — Encounter: Payer: Self-pay | Admitting: Cardiothoracic Surgery

## 2013-05-29 ENCOUNTER — Other Ambulatory Visit: Payer: Self-pay

## 2013-05-29 VITALS — BP 129/80 | HR 96 | Resp 18 | Ht 72.0 in | Wt 216.0 lb

## 2013-05-29 DIAGNOSIS — Z803 Family history of malignant neoplasm of breast: Secondary | ICD-10-CM

## 2013-05-29 DIAGNOSIS — C159 Malignant neoplasm of esophagus, unspecified: Secondary | ICD-10-CM

## 2013-05-29 DIAGNOSIS — Z800 Family history of malignant neoplasm of digestive organs: Secondary | ICD-10-CM

## 2013-05-29 DIAGNOSIS — IMO0002 Reserved for concepts with insufficient information to code with codable children: Secondary | ICD-10-CM

## 2013-05-29 DIAGNOSIS — C155 Malignant neoplasm of lower third of esophagus: Secondary | ICD-10-CM

## 2013-05-29 NOTE — Telephone Encounter (Signed)
Gave pt appt for lab and MD for June and July 2014

## 2013-05-29 NOTE — Progress Notes (Signed)
Dr.  Thornton Osborne requested a consultation for genetic counseling and risk assessment for Joel Osborne, a 47 y.o. male, for discussion of his personal history of esophageal cancer and family history of esophageal, breast, and colon cancer.  He presents to clinic today to discuss the possibility of a genetic predisposition to cancer, and to further clarify his risks, as well as his family members' risks for cancer.   HISTORY OF PRESENT ILLNESS: In April 2014, at the age of 6, Joel Osborne was diagnosed with esophageal cancer. This was treated with surgery.  Based on his family history of colon cancer, Joel Osborne had a colonoscopy where one tuberous adenoma was found.  He has never smoked.    Past Medical History  Diagnosis Date  . Hyperlipemia   . Colon polyp   . Pre-diabetes   . 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 GERD (gastroesophageal reflux disease)     Past Surgical History  Procedure Laterality Date  . Appendectomy    . Lipoma excision    . Edg  03/18/2013    Esophageal Mass    History   Social History  . Marital Status: Married    Spouse Name: N/A    Number of Children: 2  . Years of Education: N/A   Occupational History  . Chiropractor    Social History Main Topics  . Smoking status: Never Smoker   . Smokeless tobacco: Never Used  . Alcohol Use: 1.2 oz/week    2 Cans of beer per week  . Drug Use: No  . Sexually Active: None   Other Topics Concern  . None   Social History Narrative  . None    PERSONAL RISK ASSESSMENT FACTORS: Non-smoker Colonoscopy: Yes Lifetime Polyp count: 1-2  FAMILY HISTORY:  We obtained a detailed, 4-generation family history.  Significant diagnoses are listed below: Family History  Problem Relation Age of Onset  . Colon cancer Mother     dx in her 26s  . Diabetes Mother   . Breast cancer Maternal Aunt 81  . Diabetes Maternal Aunt   . Esophageal cancer Maternal Aunt 78  . Kidney failure Brother 27    Patient's maternal ancestors are of [place] descent, and paternal ancestors are of [place] descent. There is no reported Ashkenazi Jewish ancestry. There is no  known consanguinity.  GENETIC COUNSELING ASSESSMENT: Joel Osborne is a 47 y.o. male with a personal history of esophageal cancer and family history of breast, colon and esophageal cancer. We, therefore, discussed and recommended the following at today's visit.   DISCUSSION: We reviewed the characteristics, features and inheritance patterns of hereditary cancer syndromes. Based on the family history and ages of onset, Joel Osborne' family history is not consistent with a hereditary cancer syndrome.  We also discussed genetic testing, including the appropriate family members to test, the process of testing, insurance coverage and turn-around-time for results. Joel Osborne does not meet medical criteria for genetic testing based on his personal and family history of cancer.  He reports that he does not have a lot of information about his mother's diagnosis.  We discussed that if he learns that his mother was diagnosed under the age of 69, or that other family members had cancer that he had been unaware of, this could change our risk assessment.  We also discussed that if we pursued testing based on a change in family history, most likely we would not learn why he developed esophageal cancer,  as esophageal cancer is typically not part of hereditary cancer syndromes.   PLAN: After considering the risks, benefits, and limitations, Joel Osborne did not pursue genetic testing.  He will talk with his father about the family history of cancer and if he learns anything further will call so we can update our assessment. We encouraged Joel Osborne to remain in contact with cancer genetics annually so that we can continuously update the family history and inform him of any changes in cancer genetics and testing that may be of benefit for his family.  Joel Osborne's questions were answered to his satisfaction today. Our contact information was provided should additional questions or concerns arise.  The patient was seen for a total of 30 minutes, greater than 50% of which was spent face-to-face counseling.  This plan is being carried out per Joel Osborne recommendations.  This note will also be sent to the referring provider via the electronic medical record. The patient will be supplied with a summary of this genetic counseling discussion as well as educational information on the discussed hereditary cancer syndromes following the conclusion of their visit.   Patient was discussed with Joel Osborne.   _______________________________________________________________________ For Office Staff:  Number of people involved in session: 1 Was an Intern/ student involved with case: no

## 2013-05-29 NOTE — Progress Notes (Signed)
[address]            [place] [address]          [phone]      Joel Osborne [place] Medical Record #[id number] Date of Birth: 03-08-1966  Referring: Ladene Artist, MD Primary Care: Garlan Fillers, MD  Chief Complaint:    Chief Complaint  Patient presents with  . Esophageal Cancer    further discussion...MRA LIVER 06/09/13.Marland KitchenMarland KitchenFURTHER STAGING STUDIES NOT SCHEDULED    History of Present Illness:    Patient is 47 yo who  began an intentional weight loss program at Thanksgiving of last year and has lost greater than 30 pounds. He now notes   a 3 to four-month history of solid dysphagia.He continues to lose weight. He saw Dr. Jarold Motto and an esophagram on 03/06/2013 revealed a large irregular filling defect within the distal esophagus a second smaller persistent filling defect was noted in the mid to distal thoracic esophagus below the level of the carina.    An upper endoscopy on 03/18/2013 revealed a bulky friable mass at the distal 6 cm of the esophagus and involving a gastric cardia. The mass was obstructive. Retroflexed view in the stomach revealed the ulcerative mass in the cardia. The remainder of the stomach was normal. The duodenal bulb and postbulbar duodenum are normal. A biopsy confirmed adenocarcinoma. HER-2/NEU BY FISH - NO AMPLIFICATION OF HER-2 DETECTED. Accession: [id number] He was referred for staging CTs of the chest, abdomen, and pelvis on 03/20/2013. A lower right paratracheal node measured 11 mm. A high right paratracheal node measured 8 mm. Small left periaortic nodes were also seen. No evidence of pulmonary metastases. An irregular mass was noted at the distal esophagus. 9 mm lymph node adjacent to the distal esophagus. No significant adenopathy in the gastrohepatic ligament. Small periportal nodes were felt to be benign. A low-density lesion in the inferior right liver measured 15 mm and had peripheral enhancement.      The patient now has completed course of chemotherapy on May 27, today is his last day of radiation therapy. Is been tolerating therapy relatively well, and has been able to maintain his nutritional status.   Patient comes in today to further discuss surgical resection of his carcinoma. He has not yet had the MRI scan to evaluate the liver lesion this is pending next week.    Current Activity/ Functional Status:  Patient is independent with mobility/ambulation, transfers, ADL's, IADL's.  Zubrod Score: At the time of surgery this patient's most appropriate activity status/level should be described as: []  Normal activity, no symptoms [x]  Symptoms, fully ambulatory []  Symptoms, in bed less than or equal to 50% of the time []  Symptoms, in bed greater than 50% of the time but less than 100% []  Bedridden []  Moribund   Past Medical History  Diagnosis Date  . Hyperlipemia   . Colon polyp   . Pre-diabetes   . 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 GERD (gastroesophageal reflux disease)     Past Surgical History  Procedure Laterality Date  . Appendectomy    . Lipoma excision    . Edg  03/18/2013    Esophageal Mass    Family History  Problem Relation Age of Onset  . Colon cancer Mother   . Diabetes Mother   . Breast cancer Maternal Aunt   . Diabetes Maternal Aunt   .  Esophageal cancer Maternal Aunt     History   Social History  . Marital Status: Married    Spouse Name: N/A    Number of Children: 2  . Years of Education: N/A   Occupational History  . Chiropractor    Social History Main Topics  . Smoking status: Never Smoker   . Smokeless tobacco: Never Used  . Alcohol Use: 1.2 oz/week    2 Cans of beer per week  . Drug Use: No  . Sexually Active: Not on file       History  Smoking status  . Never Smoker   Smokeless tobacco  . Never Used    History  Alcohol Use  . 1.2 oz/week  . 2 Cans of beer per week     No Known Allergies  Current  Outpatient Prescriptions  Medication Sig Dispense Refill  . atorvastatin (LIPITOR) 20 MG tablet Take 20 mg by mouth daily.       . Canagliflozin (INVOKANA PO) Take 1 tablet by mouth daily.      . hyaluronate sodium (RADIAPLEXRX) GEL Apply 1 application topically 2 (two) times daily.      Marland Kitchen HYDROcodone-acetaminophen (HYCET) 7.5-325 mg/15 ml solution Take 15 mLs by mouth 4 (four) times daily as needed for pain.  600 mL  0  . ibuprofen (ADVIL,MOTRIN) 200 MG tablet Take 200 mg by mouth every 6 (six) hours as needed for pain.      . Multiple Vitamins-Minerals (MULTIVITAMIN WITH MINERALS) tablet Take 2 tablets by mouth daily. Chewable MVI      . zolpidem (AMBIEN) 5 MG tablet Take 1-2 tablets (5-10 mg total) by mouth at bedtime as needed for sleep.  60 tablet  0   No current facility-administered medications for this visit.       Review of Systems:     Cardiac Review of Systems: Y or N  Chest Pain [ n   ]  Resting SOB [  n ] Exertional SOB  [n  ]  Orthopnea [ n ]   Pedal Edema [ n  ]    Palpitations [ n ] Syncope  [ n ]   Presyncope [n   ]  General Review of Systems: [Y] = yes [  ]=no Constitional: recent weight change Cove.Etienne  ]; anorexia [ y ]; fatigue [  ]; nausea [  ]; night sweats [  ]; fever [  ]; or chills [  ];                                                                                                                                          Dental: poor dentition[ n ]; Last Dentist visit:   Eye : blurred vision [  ]; diplopia [   ]; vision changes [  ];  Amaurosis fugax[  ]; Resp: cough [  ];  wheezing[  ];  hemoptysis[  ]; shortness of breath[  ]; paroxysmal nocturnal dyspnea[  ]; dyspnea on exertion[  ]; or orthopnea[  ];  GI:  gallstones[  ], vomiting[  ];  dysphagia[ y ]; melena[ n ];  hematochezia [n  ]; heartburn[n  ];   Hx of  Colonoscopy[y  ]; GU: kidney stones [  ]; hematuria[  ];   dysuria [  ];  nocturia[  ];  history of     obstruction [  ]; urinary frequency [  ]              Skin: rash, swelling[  ];, hair loss[  ];  peripheral edema[  ];  or itching[  ]; Musculosketetal: myalgias[  ];  joint swelling[  ];  joint erythema[  ];  joint pain[  ];  back pain[  ];  Heme/Lymph: bruising[  ];  bleeding[  ];  anemia[  ];  Neuro: TIA[  ];  headaches[  ];  stroke[  ];  vertigo[  ];  seizures[  ];   paresthesias[  ];  difficulty walking[  ];  Psych:depression[  ]; anxiety[  ];  Endocrine: diabetes[  ];  thyroid dysfunction[  ];  Immunizations: Flu [ n ]; Pneumococcal[ n ];  Other:  Physical Exam: BP 129/80  Pulse 96  Resp 18  Ht 6' (1.829 m)  Wt 216 lb (97.977 kg)  BMI 29.29 kg/m2  SpO2 97%  General appearance: alert, cooperative, appears stated age and no distress Neurologic: intact Heart: regular rate and rhythm, S1, S2 normal, no murmur, click, rub or gallop and normal apical impulse Lungs: clear to auscultation bilaterally and normal percussion bilaterally Abdomen: soft, non-tender; bowel sounds normal; no masses,  no organomegaly Extremities: extremities normal, atraumatic, no cyanosis or edema, Homans sign is negative, no sign of DVT, no edema, redness or tenderness in the calves or thighs and no ulcers, gangrene or trophic changes There is no cervical supraclavicular or axillary adenopathy appreciated  Diagnostic Studies & Laboratory data:    Ct Chest W Contrast  03/20/2013  *RADIOLOGY REPORT*  Clinical Data:  Esophageal mass, rule out metastasis.  Difficulty swallowing.  CT CHEST, ABDOMEN AND PELVIS WITH CONTRAST  Technique:  Multidetector CT imaging of the chest, abdomen and pelvis was performed following the standard protocol during bolus administration of intravenous contrast.  Contrast: OMNIPAQUE IOHEXOL 300 MG/ML  SOLN,  Comparison:  Esophagram 03/06/2013  CT CHEST  Findings:  No axillary or supraclavicular lymphadenopathy.  Right lower paratracheal lymph node measures 11 mm short axis (image 24). High right paratracheal lymph node measures 8 mm  (image #11). Small left periaortic lymph nodes are also noted (image 20).  No subcarinal adenopathy.  No pericardial fluid.  Review of the lung parenchyma demonstrates no suspicious pulmonary nodules.  IMPRESSION:  1.  Paratracheal mediastinal  lymph nodes on the left and right while small are concerning for nodal metastasis. 2.  No evidence of pulmonary metastasis.  CT ABDOMEN AND PELVIS  Findings:  There is irregular mass in the distal esophagus measuring 3.3 x 3.0 cm.  There is some thickening within the gastric cardiac region enhancement (image 53).  There is a 9 mm lymph node adjacent to the distal esophagus on the right (image 53).  No significant adenopathy in the gastrohepatic ligament. Small periportal lymph nodes are less than 10 mm and likely benign.  There is a low density lesion in the inferior right hepatic lobe measuring 15 mm (image 67). This has  peripheral enhancement.  The gallbladder, pancreas, spleen, adrenal glands show no acute findings.  There are gallstones in the gallbladder.  The duodenum, small bowel, appendix, and cecum are normal.  The colon and rectosigmoid colon are normal.  Abdominal aorta normal caliber.  No retroperitoneal lymphadenopathy  No free fluid the pelvis.  The prostate gland bladder normal.  No pelvic lymphadenopathy. Review of  bone windows demonstrates no aggressive osseous lesions.  IMPRESSION:  1.  Distal esophageal mass and thickened gastric cardia concerning for primary esophageal/gastric carcinoma. 2.  Small distal para esophageal lymph nodes are concerning for local nodal metastasis. 3.  No evidence of periportal metastasis.  4.  Small hypodense lesion in the right hepatic lobe may represent benign hemangioma but is not fully characterized.  Further characterization could be achieved with contrast MRI or FDG PET scan.  5.  Please see chest section for concern for paratracheal nodal metastasis.   Original Report Authenticated By: Genevive Bi, M.D.    Ct  Abdomen Pelvis W Contrast  03/20/2013  *RADIOLOGY REPORT*  Clinical Data:  Esophageal mass, rule out metastasis.  Difficulty swallowing.  CT CHEST, ABDOMEN AND PELVIS WITH CONTRAST  Technique:  Multidetector CT imaging of the chest, abdomen and pelvis was performed following the standard protocol during bolus administration of intravenous contrast.  Contrast: OMNIPAQUE IOHEXOL 300 MG/ML  SOLN,  Comparison:  Esophagram 03/06/2013  CT CHEST  Findings:  No axillary or supraclavicular lymphadenopathy.  Right lower paratracheal lymph node measures 11 mm short axis (image 24). High right paratracheal lymph node measures 8 mm (image #11). Small left periaortic lymph nodes are also noted (image 20).  No subcarinal adenopathy.  No pericardial fluid.  Review of the lung parenchyma demonstrates no suspicious pulmonary nodules.  IMPRESSION:  1.  Paratracheal mediastinal  lymph nodes on the left and right while small are concerning for nodal metastasis. 2.  No evidence of pulmonary metastasis.  CT ABDOMEN AND PELVIS  Findings:  There is irregular mass in the distal esophagus measuring 3.3 x 3.0 cm.  There is some thickening within the gastric cardiac region enhancement (image 53).  There is a 9 mm lymph node adjacent to the distal esophagus on the right (image 53).  No significant adenopathy in the gastrohepatic ligament. Small periportal lymph nodes are less than 10 mm and likely benign.  There is a low density lesion in the inferior right hepatic lobe measuring 15 mm (image 67). This has peripheral enhancement.  The gallbladder, pancreas, spleen, adrenal glands show no acute findings.  There are gallstones in the gallbladder.  The duodenum, small bowel, appendix, and cecum are normal.  The colon and rectosigmoid colon are normal.  Abdominal aorta normal caliber.  No retroperitoneal lymphadenopathy  No free fluid the pelvis.  The prostate gland bladder normal.  No pelvic lymphadenopathy. Review of  bone windows  demonstrates no aggressive osseous lesions.  IMPRESSION:  1.  Distal esophageal mass and thickened gastric cardia concerning for primary esophageal/gastric carcinoma. 2.  Small distal para esophageal lymph nodes are concerning for local nodal metastasis. 3.  No evidence of periportal metastasis.  4.  Small hypodense lesion in the right hepatic lobe may represent benign hemangioma but is not fully characterized.  Further characterization could be achieved with contrast MRI or FDG PET scan.  5.  Please see chest section for concern for paratracheal nodal metastasis.   Original Report Authenticated By: Genevive Bi, M.D.    Dg Esophagus  03/06/2013  *RADIOLOGY REPORT*  Clinical Data:Dysphagia.  ESOPHAGUS/BARIUM SWALLOW/TABLET STUDY  Fluoroscopy Time: 2.7 minutes.  Comparison: None.  Findings: Double contrast esophagram demonstrates a large irregular filling defect within the distal esophagus.  There is a second smaller persistent filling defect within the mid to distal thoracic esophagus just below the level of the carina.  Findings are concerning for masses/neoplasm.  Cannot completely exclude a component of retained/impacted food.  Contrast material does pass around this mass-like filling defect.  Normal esophageal motility.  IMPRESSION: Large filling defect in the distal esophagus.  Smaller persistent filling defect more proximally in the mid to distal esophagus. Cannot exclude neoplasm.  Recommend further evaluation with endoscopy.  These results were discussed with Dr. Jarold Motto at the time of interpretation.   Original Report Authenticated By: Charlett Nose, M.D.    Nm Pet Image Initial (pi) Skull Base To Thigh  03/27/2013  *RADIOLOGY REPORT*  Clinical Data: Initial treatment strategy for esophageal cancer.  NUCLEAR MEDICINE PET SKULL BASE TO THIGH  Fasting Blood Glucose:  84  Technique:  18 mCi F-18 FDG was injected intravenously. CT data was obtained and used for attenuation correction and anatomic  localization only.  (This was not acquired as a diagnostic CT examination.) Additional exam technical data entered on technologist worksheet.  Comparison:  03/20/2013  Findings:  Neck: No hypermetabolic lymph nodes in the neck.  Chest:  There is a new right paratracheal lymph node which measures 9 mm.  The SUV max associated with this lymph node is not well to 4.8.  Increased FDG uptake associated with the left paratracheal lymph node has an SUV max equal to 6.7, image 79.  Posterior to the left mainstem bronchus is a short segment of intense FDG uptake associated with the esophagus.  The SUV max is equal to 8.9, image 89.   Within the distal portion of the esophagus there is a long segment of abnormal increased FDG uptake which extends into the gastric cardia.  This segment measures approximately 8.5 cm, and has an SUV max equal to 12.3.  At this level there is marked thickening of the esophageal wall.  Abdomen/Pelvis:  No abnormal hypermetabolic activity within the liver, pancreas, adrenal glands, or spleen.  No hypermetabolic lymph nodes in the abdomen or pelvis. Stones are noted within the dependent portion of the gallbladder.  Skeleton:  No focal hypermetabolic activity to suggest skeletal metastasis.  IMPRESSION:  1.  Multi focal area of hypermetabolic tumor involves the thoracic esophagus. Majority of the tumor involves the distal 8 cm of the esophagus.  2.  Hypermetabolic lymph nodes within the mediastinum which are worrisome for metastatic adenopathy. 3.  No evidence for distant metastatic disease.   Original Report Authenticated By: Signa Kell, M.D.     Recent Lab Findings: Lab Results  Component Value Date   WBC 2.1* 05/20/2013      Assessment / Plan:     Advanced stage adenocarcinoma of distal esophageal, involving proximal stomach, likely nodal involvement. Patient will need repeat scans, with particular attention to the questionable liver lesion noted previously, MRI is scheduled for June  30 we've made arrangements to have a restaging PET scan done that same day If there is no evidence of distant metastasis on followup scans we'll proceed with esophagectomy on July 28 Monday. I'll plan to see him back in 2-3 weeks after the scans are completed  Delight Ovens MD  Beeper [phone] Office [phone] 05/29/2013 9:53 AM

## 2013-05-30 ENCOUNTER — Other Ambulatory Visit: Payer: Self-pay

## 2013-05-30 ENCOUNTER — Telehealth: Payer: Self-pay | Admitting: Dietician

## 2013-05-30 ENCOUNTER — Ambulatory Visit: Payer: BC Managed Care – PPO | Admitting: Cardiothoracic Surgery

## 2013-05-30 DIAGNOSIS — C159 Malignant neoplasm of esophagus, unspecified: Secondary | ICD-10-CM

## 2013-06-03 NOTE — Progress Notes (Signed)
  Radiation Oncology         [phone] ________________________________  Name: Joel Osborne MRN: [id number]  Date: 05/15/2013  DOB: 1966/02/25  End of Treatment Note  Diagnosis:   Esophageal cancer     Indication for treatment:  Curative       Radiation treatment dates:   04/07/2013 through 05/15/2013  Site/dose:   The patient was treated to the gross disease and surrounding high-risk regions in the esophageal area. The patient was treated using an IMRT technique with daily image guidance.  The patient received 50.4 gray in 28 fractions at 1.8 gray per fraction.  Narrative: The patient tolerated radiation treatment relatively well.   The patient did not exhibit any significant worsening shortness of breath. He did have significant esophagitis during his treatment as expected which was managed with a variety of measures. The patient was able to continue his treatment without any substantial breaks.   Plan: The patient has completed radiation treatment. The patient will return to radiation oncology clinic for routine followup in one month. I advised the patient to call or return sooner if they have any questions or concerns related to their recovery or treatment. ________________________________  Radene Gunning, M.D., Ph.D.

## 2013-06-09 ENCOUNTER — Encounter (HOSPITAL_COMMUNITY)
Admission: RE | Admit: 2013-06-09 | Discharge: 2013-06-09 | Disposition: A | Discharge status: Home / Self Care | Payer: BC Managed Care – PPO | Source: Ambulatory Visit | Attending: Cardiothoracic Surgery | Admitting: Cardiothoracic Surgery

## 2013-06-09 ENCOUNTER — Ambulatory Visit (HOSPITAL_COMMUNITY)
Admission: RE | Admit: 2013-06-09 | Discharge: 2013-06-09 | Disposition: A | Discharge status: Home / Self Care | Payer: BC Managed Care – PPO | Source: Ambulatory Visit | Attending: Oncology | Admitting: Oncology

## 2013-06-09 ENCOUNTER — Encounter (HOSPITAL_COMMUNITY): Payer: Self-pay

## 2013-06-09 DIAGNOSIS — C155 Malignant neoplasm of lower third of esophagus: Secondary | ICD-10-CM

## 2013-06-09 DIAGNOSIS — C159 Malignant neoplasm of esophagus, unspecified: Secondary | ICD-10-CM | POA: Insufficient documentation

## 2013-06-09 DIAGNOSIS — D1803 Hemangioma of intra-abdominal structures: Secondary | ICD-10-CM | POA: Insufficient documentation

## 2013-06-09 DIAGNOSIS — N281 Cyst of kidney, acquired: Secondary | ICD-10-CM | POA: Insufficient documentation

## 2013-06-09 DIAGNOSIS — K229 Disease of esophagus, unspecified: Secondary | ICD-10-CM | POA: Insufficient documentation

## 2013-06-09 DIAGNOSIS — K8020 Calculus of gallbladder without cholecystitis without obstruction: Secondary | ICD-10-CM | POA: Insufficient documentation

## 2013-06-09 LAB — GLUCOSE, CAPILLARY: Glucose-Capillary: 103 mg/dL — ABNORMAL HIGH (ref 70–99)

## 2013-06-09 MED ORDER — GADOBENATE DIMEGLUMINE 529 MG/ML IV SOLN
20.0000 mL | Freq: Once | INTRAVENOUS | Status: AC | PRN
  Administered 2013-06-09: 20 mL via INTRAVENOUS

## 2013-06-09 MED ORDER — FLUDEOXYGLUCOSE F - 18 (FDG) INJECTION
20.6000 | Freq: Once | INTRAVENOUS | Status: AC | PRN
  Administered 2013-06-09: 20.6 via INTRAVENOUS

## 2013-06-10 ENCOUNTER — Ambulatory Visit (HOSPITAL_BASED_OUTPATIENT_CLINIC_OR_DEPARTMENT_OTHER): Payer: BC Managed Care – PPO | Admitting: Oncology

## 2013-06-10 VITALS — BP 119/77 | HR 108 | Temp 98.4°F | Resp 20 | Ht 72.0 in | Wt 220.0 lb

## 2013-06-10 DIAGNOSIS — C155 Malignant neoplasm of lower third of esophagus: Secondary | ICD-10-CM

## 2013-06-10 NOTE — Progress Notes (Signed)
   [facility]    OFFICE PROGRESS NOTE   INTERVAL HISTORY:   He returns as scheduled. Mild odynophagia and dysphagia persist. Overall his swallowing has improved. He continues to have malaise, but he is working. He saw Dr. Tyrone Sage and is scheduled for surgery later this month.  Objective:  Vital signs in last 24 hours:  Blood pressure 119/77, pulse 108, temperature 98.4 F (36.9 C), temperature source Oral, resp. rate 20, height 6' (1.829 m), weight 220 lb (99.791 kg).    HEENT: No thrush or ulcers, neck without mass Lymphatics: No cervical, supraclavicular, or axillary nodes Resp: Lungs clear bilaterally Cardio: Regular rate and rhythm GI: No hepatomegaly, nontender Vascular: No leg edema  Skin: Fading radiation rash over the mid back    Lab Results:  Lab Results  Component Value Date   WBC 2.1* 05/20/2013   HGB 12.4* 05/20/2013   HCT 36.7* 05/20/2013   MCV 81.6 05/20/2013   PLT 169 05/20/2013   ANC 1.5  X-rays: PET scan 06/09/2013-the distal esophageal mass shows mild decrease in size and activity compared to the 03/27/2013 study, small hypermetabolic mediastinal nodes are no longer seen. No sign of metastatic disease.  MRI of the liver on 06/09/2013-posterior segment right hepatic lobe hemangioma. No MRI evidence for hepatic or intra-abdominal metastatic disease.  Medications: I have reviewed the patient's current medications.  Assessment/Plan: 1.Adenocarcinoma of the distal esophagus/gastric cardia-status post an endoscopic biopsy on 03/18/2013 confirming adenocarcinoma  -Staging CT scans 03/20/2013 confirmed a distal esophageal/gastric cardia mass, distal paraesophageal lymph nodes and paratracheal nodes concerning for metastatic lymphadenopathy  -A PET scan 03/27/2013 with a multifocal area of hypermetabolic activity involving the thoracic esophagus extending into the gastric cardia and hypermetabolic mediastinal nodes, no distant metastatic disease    -Initiation of radiation 04/07/2013 and weekly Taxol/carboplatin 04/08/2013, radiation completed 05/15/2013  -Restaging PET scan 06/09/2013-no evidence of metastatic disease, no hypermetabolic lymphadenopathy 2. Indeterminate 15 mm right liver lesion on the CT scan 03/20/2013 , MRI of the liver 06/09/2013 confirmed a right liver hemangioma 3. Anorexia/weight loss and solid dysphagia secondary to #1 -improved 4. History of a colon polyp, status post a polypectomy October 2013  5. Thrombocytopenia secondary chemotherapy-the carboplatin wase dose reduced with week 5 of chemotherapy chemotherapy    Disposition:  He is recovering from the chemotherapy/radiation. His performance status has improved and he has gained weight. The restaging PET scan reveals no evidence of metastatic disease. He is scheduled to undergo an esophagectomy on 07/07/2013. He will return for an office visit here 3-4 weeks after surgery.   Thornton Papas, MD  06/10/2013  3:22 PM

## 2013-06-12 ENCOUNTER — Telehealth: Payer: Self-pay | Admitting: Oncology

## 2013-06-23 ENCOUNTER — Encounter (HOSPITAL_COMMUNITY): Payer: Self-pay | Admitting: Pharmacy Technician

## 2013-06-26 ENCOUNTER — Ambulatory Visit: Payer: BC Managed Care – PPO | Admitting: Cardiothoracic Surgery

## 2013-06-27 ENCOUNTER — Ambulatory Visit: Payer: BC Managed Care – PPO | Admitting: Cardiothoracic Surgery

## 2013-06-30 ENCOUNTER — Ambulatory Visit (INDEPENDENT_AMBULATORY_CARE_PROVIDER_SITE_OTHER): Payer: BC Managed Care – PPO | Admitting: Cardiothoracic Surgery

## 2013-06-30 VITALS — BP 118/79 | HR 90 | Resp 20 | Ht 72.0 in | Wt 224.0 lb

## 2013-06-30 DIAGNOSIS — C159 Malignant neoplasm of esophagus, unspecified: Secondary | ICD-10-CM

## 2013-06-30 NOTE — Progress Notes (Signed)
[address]       [place] [address]             [id number]                      Joel Osborne  Medical Record #[id number] Date of Birth: March 26, 1966  Referring: Ladene Artist, MD Primary Care: Garlan Fillers, MD  Chief Complaint:      Patient presents with  . Esophageal Cancer    f/u to discuss PET Scan and MRI from 06/09/13 and planned esophageal resection    History of Present Illness:    Patient is 47 yo who  began an intentional weight loss program at Thanksgiving of last year and has lost greater than 30 pounds. He noted  3 to four-month history of solid dysphagia.  He saw Dr. Jarold Motto and an esophagram on 03/06/2013 revealed a large irregular filling defect within the distal esophagus a second smaller persistent filling defect was noted in the mid to distal thoracic esophagus below the level of the carina.    An upper endoscopy on 03/18/2013 revealed a bulky friable mass at the distal 6 cm of the esophagus and involving a gastric cardia. The mass was obstructive. Retroflexed view in the stomach revealed the ulcerative mass in the cardia. The remainder of the stomach was normal. The duodenal bulb and postbulbar duodenum are normal. A biopsy confirmed adenocarcinoma. HER-2/NEU BY FISH - NO AMPLIFICATION OF HER-2 DETECTED. Accession: [id number]  He was referred for staging CTs of the chest, abdomen, and pelvis on 03/20/2013. A lower right paratracheal node measured 11 mm. A high right paratracheal node measured 8 mm. Small left periaortic nodes were also seen. No evidence of pulmonary metastases. An irregular mass was noted at the distal esophagus. 9 mm lymph node adjacent to the distal esophagus. No significant adenopathy in the gastrohepatic ligament. Small periportal nodes were felt to be benign. A low-density lesion in the inferior right liver measured 15 mm and had peripheral enhancement.    The patient now has completed course of  chemotherapy on May 27, and  radiation therapy. Is been tolerating therapy relatively well, and has been able to maintain his nutritional status. He notes he is eating better    Patient comes in today to further discuss surgical resection. Since completion of radiation and chemo therapy a MRI of the liver and PET scan has been done   Current Activity/ Functional Status:  Patient is independent with mobility/ambulation, transfers, ADL's, IADL's.  Zubrod Score: At the time of surgery this patient's most appropriate activity status/level should be described as: []  Normal activity, no symptoms [x]  Symptoms, fully ambulatory []  Symptoms, in bed less than or equal to 50% of the time []  Symptoms, in bed greater than 50% of the time but less than 100% []  Bedridden []  Moribund   Past Medical History  Diagnosis Date  . Hyperlipemia   . Colon polyp   . Pre-diabetes   . GERD (gastroesophageal reflux disease)   . Diabetes mellitus without complication   . Esophageal cancer 03/18/2013    Adenocarcinoma    Past Surgical History  Procedure Laterality Date  . Appendectomy    . Lipoma excision    . Edg  03/18/2013    Esophageal Mass    Family History  Problem Relation Age of Onset  . Colon cancer Mother     dx in her 39s  . Diabetes Mother   .  Breast cancer Maternal Aunt 81  . Diabetes Maternal Aunt   . Esophageal cancer Maternal Aunt 78  . Kidney failure Brother 80    History   Social History  . Marital Status: Married    Spouse Name: N/A    Number of Children: 2  . Years of Education: N/A   Occupational History  . Chiropractor    Social History Main Topics  . Smoking status: Never Smoker   . Smokeless tobacco: Never Used  . Alcohol Use: 1.2 oz/week    2 Cans of beer per week  . Drug Use: No  .         History  Smoking status  . Never Smoker   Smokeless tobacco  . Never Used    History  Alcohol Use  . 1.2 oz/week  . 2 Cans of beer per week     No Known  Allergies  Current Outpatient Prescriptions  Medication Sig Dispense Refill  . atorvastatin (LIPITOR) 20 MG tablet Take 20 mg by mouth daily.       . Canagliflozin (INVOKANA) 300 MG TABS Take 1 tablet by mouth daily.      Marland Kitchen HYDROcodone-acetaminophen (HYCET) 7.5-325 mg/15 ml solution Take 15 mLs by mouth 4 (four) times daily as needed for pain.  600 mL  0  . ibuprofen (ADVIL,MOTRIN) 200 MG tablet Take 400 mg by mouth every 6 (six) hours as needed for pain.       . Multiple Vitamins-Minerals (MULTIVITAMIN WITH MINERALS) tablet Take 2 tablets by mouth daily. Chewable MVI      . zolpidem (AMBIEN) 5 MG tablet Take 1-2 tablets (5-10 mg total) by mouth at bedtime as needed for sleep.  60 tablet  0   No current facility-administered medications for this visit.   Facility-Administered Medications Ordered in Other Visits  Medication Dose Route Frequency Provider Last Rate Last Dose  . cefUROXime (ZINACEF) 1.5 g in dextrose 5 % 50 mL IVPB  1.5 g Intravenous 60 min Pre-Op Delight Ovens, MD           Review of Systems:     Cardiac Review of Systems: Y or N  Chest Pain [ n   ]  Resting SOB [  n ] Exertional SOB  [n  ]  Orthopnea [ n ]   Pedal Edema [ n  ]    Palpitations [ n ] Syncope  [ n ]   Presyncope [n   ]  General Review of Systems: [Y] = yes [  ]=no Constitional: recent weight change Cove.Etienne  ]; anorexia [ y ]; fatigue [  ]; nausea [  ]; night sweats [  ]; fever [  ]; or chills [  ];  Dental: poor dentition[ n ]; Last Dentist visit:   Eye : blurred vision [  ]; diplopia [   ]; vision changes [  ];  Amaurosis fugax[  ]; Resp: cough [  ];  wheezing[  ];  hemoptysis[  ]; shortness of breath[  ]; paroxysmal nocturnal dyspnea[  ]; dyspnea on exertion[  ]; or orthopnea[  ];  GI:  gallstones[  ], vomiting[  ];  dysphagia[ y ]; melena[ n ];  hematochezia [n  ];  heartburn[n  ];   Hx of  Colonoscopy[y  ]; GU: kidney stones [  ]; hematuria[  ];   dysuria [  ];  nocturia[  ];  history of     obstruction [  ]; urinary frequency [  ]             Skin: rash, swelling[  ];, hair loss[  ];  peripheral edema[  ];  or itching[  ]; Musculosketetal: myalgias[  ];  joint swelling[  ];  joint erythema[  ];  joint pain[  ];  back pain[  ];  Heme/Lymph: bruising[  ];  bleeding[  ];  anemia[  ];  Neuro: TIA[  ];  headaches[  ];  stroke[  ];  vertigo[  ];  seizures[  ];   paresthesias[  ];  difficulty walking[  ];  Psych:depression[  ]; anxiety[  ];  Endocrine: diabetes[  ];  thyroid dysfunction[  ];  Immunizations: Flu [ n ]; Pneumococcal[ n ];  Other:  Physical Exam: BP 118/79  Pulse 90  Resp 20  Ht 6' (1.829 m)  Wt 224 lb (101.606 kg)  BMI 30.37 kg/m2  SpO2 97%  General appearance: alert, cooperative, appears stated age and no distress Neurologic: intact Heart: regular rate and rhythm, S1, S2 normal, no murmur, click, rub or gallop and normal apical impulse Lungs: clear to auscultation bilaterally and normal percussion bilaterally Abdomen: soft, non-tender; bowel sounds normal; no masses,  no organomegaly Extremities: extremities normal, atraumatic, no cyanosis or edema, Homans sign is negative, no sign of DVT, no edema, redness or tenderness in the calves or thighs and no ulcers, gangrene or trophic changes There is no cervical supraclavicular or axillary adenopathy appreciated  Diagnostic Studies & Laboratory data:  Mr Liver W Wo Contrast  06/09/2013   *RADIOLOGY REPORT*  Clinical Data:  Esophageal cancer, initially diagnosed March 2014. Liver lesion without corresponding F D G uptake on previous recent PET CT.  MRI ABDOMEN WITHOUT AND WITH CONTRAST  Technique:  Multiplanar multisequence MR imaging of the abdomen was performed both before and after the administration of intravenous contrast.  Contrast: 20mL MULTIHANCE GADOBENATE DIMEGLUMINE 529 MG/ML IV  SOLN  Comparison:  PET CT 03/27/2013, diagnostic CT 03/20/2013 (  Findings:  Gallstones are noted.  Mass like filling defect within the distal esophagus is again noted but not as well seen as on prior dedicated imaging.  Trace perisplenic fluid incidentally noted.  Bilateral T2 hyperintense renal cortical lesions without internal enhancement are noted, largest 1.3 cm left mid kidney, most likely cysts, unchanged.  Within the posterior segment right hepatic lobe, there is a 1.4 cm T2 hyperintense, T1 hypointense lesion with discontinuous peripheral nodular enhancement with progressive filling in on delayed images.  This corresponds to the finding on the previous exam and is compatible with a hemangioma. No abnormal T2 hyperintense or enhancing liver mass is identified. The spleen, pancreas, and adrenal glands are normal.  No common duct or intrahepatic ductal dilatation. Blooming  artifact within the hollow viscus most likely reflects ingested oral material such as vitamin containing iron or other mineral.  IMPRESSION: Posterior segment right hepatic lobe hemangioma.  No MRI evidence for hepatic or other intra-abdominal metastatic disease.  Gallstones and renal cortical cysts again incidentally noted.  Distal esophageal mass corresponding to known esophageal cancer is re-identified.   Original Report Authenticated By: Christiana Pellant, M.D.   Nm Pet Image Restag (ps) Skull Base To Thigh  06/09/2013   *RADIOLOGY REPORT*  Clinical Data: Subsequent treatment strategy for esophageal carcinoma.  NUCLEAR MEDICINE PET SKULL BASE TO THIGH  Fasting Blood Glucose:  103  Technique:  20.6 mCi F-18 FDG was injected intravenously. CT data was obtained and used for attenuation correction and anatomic localization only.  (This was not acquired as a diagnostic CT examination.) Additional exam technical data entered on technologist worksheet.  Comparison:  03/27/2013  Findings:  Neck: No hypermetabolic lymph nodes in the neck.  Chest:   The distal esophageal mass shows mild decrease in the size as well as a decreased hypermetabolic activity.  Maximum SUV currently measures 10.9 compared with 12.3 on previous study.  Mild hypermetabolic activity is also seen in the proximal thoracic esophagus, but no separate hypermetabolic mediastinal lymph nodes are identified on current exam. Previously suspected small hypermetabolic mediastinal lymph nodes are no longer seen.  There is no evidence of hypermetabolic hilar or axillary lymphadenopathy. CT images show no suspicious pulmonary nodules or masses.  Abdomen/Pelvis:  No abnormal hypermetabolic activity within the liver, pancreas, adrenal glands, or spleen.  No hypermetabolic lymph nodes in the abdomen or pelvis.  Skeleton:  No focal hypermetabolic activity to suggest skeletal metastasis.  IMPRESSION:  1.  Mild decrease in hypermetabolic activity associated with distal esophageal mass. 2.  Previously noted small hypermetabolic mediastinal lymph nodes no longer visualized.  No other signs of metastatic disease identified.   Original Report Authenticated By: Myles Rosenthal, M.D.    Ct Chest W Contrast  03/20/2013  *RADIOLOGY REPORT*  Clinical Data:  Esophageal mass, rule out metastasis.  Difficulty swallowing.  CT CHEST, ABDOMEN AND PELVIS WITH CONTRAST  Technique:  Multidetector CT imaging of the chest, abdomen and pelvis was performed following the standard protocol during bolus administration of intravenous contrast.  Contrast: OMNIPAQUE IOHEXOL 300 MG/ML  SOLN,  Comparison:  Esophagram 03/06/2013  CT CHEST  Findings:  No axillary or supraclavicular lymphadenopathy.  Right lower paratracheal lymph node measures 11 mm short axis (image 24). High right paratracheal lymph node measures 8 mm (image #11). Small left periaortic lymph nodes are also noted (image 20).  No subcarinal adenopathy.  No pericardial fluid.  Review of the lung parenchyma demonstrates no suspicious pulmonary nodules.  IMPRESSION:   1.  Paratracheal mediastinal  lymph nodes on the left and right while small are concerning for nodal metastasis. 2.  No evidence of pulmonary metastasis.  CT ABDOMEN AND PELVIS  Findings:  There is irregular mass in the distal esophagus measuring 3.3 x 3.0 cm.  There is some thickening within the gastric cardiac region enhancement (image 53).  There is a 9 mm lymph node adjacent to the distal esophagus on the right (image 53).  No significant adenopathy in the gastrohepatic ligament. Small periportal lymph nodes are less than 10 mm and likely benign.  There is a low density lesion in the inferior right hepatic lobe measuring 15 mm (image 67). This has peripheral enhancement.  The gallbladder, pancreas, spleen, adrenal glands show no acute findings.  There are gallstones in the gallbladder.  The duodenum, small bowel, appendix, and cecum are normal.  The colon and rectosigmoid colon are normal.  Abdominal aorta normal caliber.  No retroperitoneal lymphadenopathy  No free fluid the pelvis.  The prostate gland bladder normal.  No pelvic lymphadenopathy. Review of  bone windows demonstrates no aggressive osseous lesions.  IMPRESSION:  1.  Distal esophageal mass and thickened gastric cardia concerning for primary esophageal/gastric carcinoma. 2.  Small distal para esophageal lymph nodes are concerning for local nodal metastasis. 3.  No evidence of periportal metastasis.  4.  Small hypodense lesion in the right hepatic lobe may represent benign hemangioma but is not fully characterized.  Further characterization could be achieved with contrast MRI or FDG PET scan.  5.  Please see chest section for concern for paratracheal nodal metastasis.   Original Report Authenticated By: Genevive Bi, M.D.    Ct Abdomen Pelvis W Contrast  03/20/2013  *RADIOLOGY REPORT*  Clinical Data:  Esophageal mass, rule out metastasis.  Difficulty swallowing.  CT CHEST, ABDOMEN AND PELVIS WITH CONTRAST  Technique:  Multidetector CT imaging  of the chest, abdomen and pelvis was performed following the standard protocol during bolus administration of intravenous contrast.  Contrast: OMNIPAQUE IOHEXOL 300 MG/ML  SOLN,  Comparison:  Esophagram 03/06/2013  CT CHEST  Findings:  No axillary or supraclavicular lymphadenopathy.  Right lower paratracheal lymph node measures 11 mm short axis (image 24). High right paratracheal lymph node measures 8 mm (image #11). Small left periaortic lymph nodes are also noted (image 20).  No subcarinal adenopathy.  No pericardial fluid.  Review of the lung parenchyma demonstrates no suspicious pulmonary nodules.  IMPRESSION:  1.  Paratracheal mediastinal  lymph nodes on the left and right while small are concerning for nodal metastasis. 2.  No evidence of pulmonary metastasis.  CT ABDOMEN AND PELVIS  Findings:  There is irregular mass in the distal esophagus measuring 3.3 x 3.0 cm.  There is some thickening within the gastric cardiac region enhancement (image 53).  There is a 9 mm lymph node adjacent to the distal esophagus on the right (image 53).  No significant adenopathy in the gastrohepatic ligament. Small periportal lymph nodes are less than 10 mm and likely benign.  There is a low density lesion in the inferior right hepatic lobe measuring 15 mm (image 67). This has peripheral enhancement.  The gallbladder, pancreas, spleen, adrenal glands show no acute findings.  There are gallstones in the gallbladder.  The duodenum, small bowel, appendix, and cecum are normal.  The colon and rectosigmoid colon are normal.  Abdominal aorta normal caliber.  No retroperitoneal lymphadenopathy  No free fluid the pelvis.  The prostate gland bladder normal.  No pelvic lymphadenopathy. Review of  bone windows demonstrates no aggressive osseous lesions.  IMPRESSION:  1.  Distal esophageal mass and thickened gastric cardia concerning for primary esophageal/gastric carcinoma. 2.  Small distal para esophageal lymph nodes are concerning  for local nodal metastasis. 3.  No evidence of periportal metastasis.  4.  Small hypodense lesion in the right hepatic lobe may represent benign hemangioma but is not fully characterized.  Further characterization could be achieved with contrast MRI or FDG PET scan.  5.  Please see chest section for concern for paratracheal nodal metastasis.   Original Report Authenticated By: Genevive Bi, M.D.    Dg Esophagus  03/06/2013  *RADIOLOGY REPORT*  Clinical Data:Dysphagia.  ESOPHAGUS/BARIUM SWALLOW/TABLET STUDY  Fluoroscopy Time: 2.7 minutes.  Comparison: None.  Findings: Double contrast esophagram demonstrates a large irregular filling defect within the distal esophagus.  There is a second smaller persistent filling defect within the mid to distal thoracic esophagus just below the level of the carina.  Findings are concerning for masses/neoplasm.  Cannot completely exclude a component of retained/impacted food.  Contrast material does pass around this mass-like filling defect.  Normal esophageal motility.  IMPRESSION: Large filling defect in the distal esophagus.  Smaller persistent filling defect more proximally in the mid to distal esophagus. Cannot exclude neoplasm.  Recommend further evaluation with endoscopy.  These results were discussed with Dr. Jarold Motto at the time of interpretation.   Original Report Authenticated By: Charlett Nose, M.D.    Nm Pet Image Initial (pi) Skull Base To Thigh  03/27/2013  *RADIOLOGY REPORT*  Clinical Data: Initial treatment strategy for esophageal cancer.  NUCLEAR MEDICINE PET SKULL BASE TO THIGH  Fasting Blood Glucose:  84  Technique:  18 mCi F-18 FDG was injected intravenously. CT data was obtained and used for attenuation correction and anatomic localization only.  (This was not acquired as a diagnostic CT examination.) Additional exam technical data entered on technologist worksheet.  Comparison:  03/20/2013  Findings:  Neck: No hypermetabolic lymph nodes in the neck.   Chest:  There is a new right paratracheal lymph node which measures 9 mm.  The SUV max associated with this lymph node is not well to 4.8.  Increased FDG uptake associated with the left paratracheal lymph node has an SUV max equal to 6.7, image 79.  Posterior to the left mainstem bronchus is a short segment of intense FDG uptake associated with the esophagus.  The SUV max is equal to 8.9, image 89.   Within the distal portion of the esophagus there is a long segment of abnormal increased FDG uptake which extends into the gastric cardia.  This segment measures approximately 8.5 cm, and has an SUV max equal to 12.3.  At this level there is marked thickening of the esophageal wall.  Abdomen/Pelvis:  No abnormal hypermetabolic activity within the liver, pancreas, adrenal glands, or spleen.  No hypermetabolic lymph nodes in the abdomen or pelvis. Stones are noted within the dependent portion of the gallbladder.  Skeleton:  No focal hypermetabolic activity to suggest skeletal metastasis.  IMPRESSION:  1.  Multi focal area of hypermetabolic tumor involves the thoracic esophagus. Majority of the tumor involves the distal 8 cm of the esophagus.  2.  Hypermetabolic lymph nodes within the mediastinum which are worrisome for metastatic adenopathy. 3.  No evidence for distant metastatic disease.   Original Report Authenticated By: Signa Kell, M.D.     Recent Lab Findings: Lab Results  Component Value Date   WBC 8.2 07/03/2013      Assessment / Plan:     Advanced stage adenocarcinoma of distal esophageal, and cardia, likely nodal involvement with preop radiation and chemo therapy now completed MRI and PET scan show now evidence of metastatic disease. We will  proceed with esophagectomy, transhiatal and bronchoscopy on July 28 Monday.  I had a detailed discussion with Joel Osborne and his wife  regarding the magnitude of the surgical esophagectomy procedure as well as the risks, the expected benefits, and  alternatives.  I quoted Maurine Minister Stander 5% perioperative mortality rate and a complication rate as high as 35%.  We specifically discussed complications, which include, but were not limited to: recurrent nerve injury with possible permanent hoarseness, anastomotic leak, airway and great vessel injury,  conduit ischemia, thoracic duct leak, the inability to complete the operation via a transhiatal approach requiring a right thoracotomy,  bleeding, need for blood transfusion and the potential need for ventilator support.  Zackarie Chason Mahnken has had questions answered is well informed and willing to proceed.   Delight Ovens MD  Beeper [phone] Office [phone]

## 2013-06-30 NOTE — Patient Instructions (Signed)
July 25 start low residual diet  July 26 start clear liquids only July 27 in am drink 1/2 bottle of magnesium citrate Nothing after midnight July 27

## 2013-07-01 ENCOUNTER — Other Ambulatory Visit: Payer: Self-pay | Admitting: *Deleted

## 2013-07-01 DIAGNOSIS — C159 Malignant neoplasm of esophagus, unspecified: Secondary | ICD-10-CM

## 2013-07-03 ENCOUNTER — Encounter (HOSPITAL_COMMUNITY): Payer: Self-pay

## 2013-07-03 ENCOUNTER — Encounter (HOSPITAL_COMMUNITY)
Admission: RE | Admit: 2013-07-03 | Discharge: 2013-07-03 | Disposition: A | Discharge status: Home / Self Care | Payer: BC Managed Care – PPO | Source: Ambulatory Visit | Attending: Cardiothoracic Surgery | Admitting: Cardiothoracic Surgery

## 2013-07-03 ENCOUNTER — Ambulatory Visit: Payer: BC Managed Care – PPO | Admitting: Radiation Oncology

## 2013-07-03 VITALS — BP 123/82 | HR 93 | Temp 99.0°F | Resp 20 | Ht 72.0 in | Wt 222.9 lb

## 2013-07-03 DIAGNOSIS — C159 Malignant neoplasm of esophagus, unspecified: Secondary | ICD-10-CM | POA: Insufficient documentation

## 2013-07-03 DIAGNOSIS — Z01812 Encounter for preprocedural laboratory examination: Secondary | ICD-10-CM | POA: Insufficient documentation

## 2013-07-03 DIAGNOSIS — Z01810 Encounter for preprocedural cardiovascular examination: Secondary | ICD-10-CM | POA: Insufficient documentation

## 2013-07-03 LAB — COMPREHENSIVE METABOLIC PANEL
ALT: 20 U/L (ref 0–53)
AST: 16 U/L (ref 0–37)
Albumin: 4.2 g/dL (ref 3.5–5.2)
Alkaline Phosphatase: 72 U/L (ref 39–117)
BUN: 15 mg/dL (ref 6–23)
CO2: 27 mEq/L (ref 19–32)
Calcium: 9.8 mg/dL (ref 8.4–10.5)
Chloride: 102 mEq/L (ref 96–112)
Creatinine, Ser: 0.85 mg/dL (ref 0.50–1.35)
GFR calc Af Amer: >90 mL/min (ref >90)
GFR calc non Af Amer: >90 mL/min (ref >90)
Glucose, Bld: 105 mg/dL — ABNORMAL HIGH (ref 70–99)
Potassium: 4.3 mEq/L (ref 3.5–5.1)
Sodium: 140 mEq/L (ref 135–145)
Total Bilirubin: 0.4 mg/dL (ref 0.3–1.2)
Total Protein: 7.6 g/dL (ref 6.0–8.3)

## 2013-07-03 LAB — URINALYSIS, ROUTINE W REFLEX MICROSCOPIC
Bilirubin Urine: NEGATIVE
Glucose, UA: >1000 mg/dL — AB
Hgb urine dipstick: NEGATIVE
Ketones, ur: NEGATIVE mg/dL
Leukocytes, UA: NEGATIVE
Nitrite: NEGATIVE
Protein, ur: NEGATIVE mg/dL
Specific Gravity, Urine: 1.038 — ABNORMAL HIGH (ref 1.005–1.030)
Urobilinogen, UA: 0.2 mg/dL (ref 0.0–1.0)
pH: 6 (ref 5.0–8.0)

## 2013-07-03 LAB — CBC
HCT: 45.3 % (ref 39.0–52.0)
Hemoglobin: 15.7 g/dL (ref 13.0–17.0)
MCH: 30.4 pg (ref 26.0–34.0)
MCHC: 34.7 g/dL (ref 30.0–36.0)
MCV: 87.8 fL (ref 78.0–100.0)
Platelets: 186 10*3/uL (ref 150–400)
RBC: 5.16 MIL/uL (ref 4.22–5.81)
RDW: 15.4 % (ref 11.5–15.5)
WBC: 8.2 10*3/uL (ref 4.0–10.5)

## 2013-07-03 LAB — URINE MICROSCOPIC-ADD ON

## 2013-07-03 LAB — ABO/RH: ABO/RH(D): A POS

## 2013-07-03 LAB — PROTIME-INR
INR: 0.94 (ref 0.00–1.49)
Prothrombin Time: 12.4 seconds (ref 11.6–15.2)

## 2013-07-03 LAB — APTT: aPTT: 25 seconds (ref 24–37)

## 2013-07-03 LAB — TYPE AND SCREEN
ABO/RH(D): A POS
Antibody Screen: NEGATIVE

## 2013-07-03 NOTE — Progress Notes (Signed)
REQUESTED SLEEP STUDY FROM DR. PATTERSONS OFFICE  [phone]

## 2013-07-03 NOTE — Pre-Procedure Instructions (Addendum)
Joel Osborne  07/03/2013   Your procedure is scheduled on:  Monday, July 07, 2013  Report to [facility] at 5:30 AM.  Call this number if you have problems the morning of surgery: [phone]   Remember:   Do not eat food or drink liquids after midnight.   Take these medicines the morning of surgery with A SIP OF WATER: none Stop taking Aspirin and herbal medications. Do not take any NSAIDs ie: Ibuprofen,( Advil, Motrin) Naproxen or any medication containing Aspirin   Do not wear jewelry, make-up or nail polish.  Do not wear lotions, powders, or perfumes. You may wear deodorant.  Do not shave 48 hours prior to surgery. Men may shave face and neck.  Do not bring valuables to the hospital.  [facility] is not responsible for any belongings or valuables.  Contacts, dentures or bridgework may not be worn into surgery.  Leave suitcase in the car. After surgery it may be brought to your room.  For patients admitted to the hospital, checkout time is 11:00 AM the day of discharge.   Patients discharged the day of surgery will not be allowed to drive home.  Name and phone number of your driver:   Special Instructions: Shower using CHG 2 nights before surgery and the night before surgery.  If you shower the day of surgery use CHG.  Use special wash - you have one bottle of CHG for all showers.  You should use approximately 1/3 of the bottle for each shower.   Please read over the following fact sheets that you were given: Pain Booklet, Coughing and Deep Breathing, Blood Transfusion Information and Surgical Site Infection Prevention

## 2013-07-06 ENCOUNTER — Encounter: Payer: Self-pay | Admitting: Cardiothoracic Surgery

## 2013-07-06 MED ORDER — DEXTROSE 5 % IV SOLN
1.5000 g | INTRAVENOUS | Status: AC
  Administered 2013-07-07: 1.5 g via INTRAVENOUS
  Filled 2013-07-06: qty 1.5

## 2013-07-07 ENCOUNTER — Inpatient Hospital Stay (HOSPITAL_COMMUNITY): Payer: BC Managed Care – PPO

## 2013-07-07 ENCOUNTER — Inpatient Hospital Stay (HOSPITAL_COMMUNITY): Payer: BC Managed Care – PPO | Admitting: Certified Registered"

## 2013-07-07 ENCOUNTER — Encounter (HOSPITAL_COMMUNITY)
Admission: RE | Disposition: A | Discharge status: Home / Self Care | Payer: Self-pay | Source: Ambulatory Visit | Attending: Cardiothoracic Surgery

## 2013-07-07 ENCOUNTER — Inpatient Hospital Stay (HOSPITAL_COMMUNITY)
Admission: RE | Admit: 2013-07-07 | Discharge: 2013-07-18 | DRG: 154 | Disposition: A | Discharge status: Home / Self Care | Payer: BC Managed Care – PPO | Source: Ambulatory Visit | Attending: Cardiothoracic Surgery | Admitting: Cardiothoracic Surgery

## 2013-07-07 ENCOUNTER — Encounter (HOSPITAL_COMMUNITY): Payer: Self-pay | Admitting: *Deleted

## 2013-07-07 ENCOUNTER — Encounter (HOSPITAL_COMMUNITY): Payer: Self-pay | Admitting: Certified Registered"

## 2013-07-07 DIAGNOSIS — Z800 Family history of malignant neoplasm of digestive organs: Secondary | ICD-10-CM

## 2013-07-07 DIAGNOSIS — Z923 Personal history of irradiation: Secondary | ICD-10-CM

## 2013-07-07 DIAGNOSIS — K219 Gastro-esophageal reflux disease without esophagitis: Secondary | ICD-10-CM | POA: Diagnosis present

## 2013-07-07 DIAGNOSIS — E119 Type 2 diabetes mellitus without complications: Secondary | ICD-10-CM | POA: Diagnosis present

## 2013-07-07 DIAGNOSIS — C159 Malignant neoplasm of esophagus, unspecified: Secondary | ICD-10-CM

## 2013-07-07 DIAGNOSIS — E876 Hypokalemia: Secondary | ICD-10-CM | POA: Diagnosis not present

## 2013-07-07 DIAGNOSIS — C155 Malignant neoplasm of lower third of esophagus: Secondary | ICD-10-CM

## 2013-07-07 DIAGNOSIS — E785 Hyperlipidemia, unspecified: Secondary | ICD-10-CM | POA: Diagnosis present

## 2013-07-07 DIAGNOSIS — D649 Anemia, unspecified: Secondary | ICD-10-CM | POA: Diagnosis not present

## 2013-07-07 DIAGNOSIS — K2270 Barrett's esophagus without dysplasia: Secondary | ICD-10-CM | POA: Diagnosis present

## 2013-07-07 DIAGNOSIS — Z9221 Personal history of antineoplastic chemotherapy: Secondary | ICD-10-CM

## 2013-07-07 DIAGNOSIS — Z803 Family history of malignant neoplasm of breast: Secondary | ICD-10-CM

## 2013-07-07 DIAGNOSIS — C160 Malignant neoplasm of cardia: Principal | ICD-10-CM | POA: Diagnosis present

## 2013-07-07 HISTORY — PX: VIDEO BRONCHOSCOPY: SHX5072

## 2013-07-07 HISTORY — PX: COMPLETE ESOPHAGECTOMY: SHX5286

## 2013-07-07 LAB — BLOOD GAS, ARTERIAL
Acid-Base Excess: 2.5 mmol/L — ABNORMAL HIGH (ref 0.0–2.0)
Bicarbonate: 26.8 mEq/L — ABNORMAL HIGH (ref 20.0–24.0)
Drawn by: 181601
FIO2: 0.21 %
O2 Saturation: 67.5 %
Patient temperature: 98.6
TCO2: 28.1 mmol/L (ref 0–100)
pCO2 arterial: 43.3 mmHg (ref 35.0–45.0)
pH, Arterial: 7.407 (ref 7.350–7.450)
pO2, Arterial: 37.2 mmHg — CL (ref 80.0–100.0)

## 2013-07-07 LAB — BASIC METABOLIC PANEL
CO2: 23 mEq/L (ref 19–32)
Chloride: 104 mEq/L (ref 96–112)
GFR calc Af Amer: >90 mL/min (ref >90)
Potassium: 4.7 mEq/L (ref 3.5–5.1)
Sodium: 137 mEq/L (ref 135–145)

## 2013-07-07 LAB — POCT I-STAT 3, ART BLOOD GAS (G3+)
Acid-base deficit: 2 mmol/L (ref 0.0–2.0)
Bicarbonate: 22.8 mEq/L (ref 20.0–24.0)
O2 Saturation: 97 %
Patient temperature: 99.2
TCO2: 24 mmol/L (ref 0–100)
pCO2 arterial: 38.4 mmHg (ref 35.0–45.0)
pH, Arterial: 7.383 (ref 7.350–7.450)
pO2, Arterial: 95 mmHg (ref 80.0–100.0)

## 2013-07-07 LAB — CBC
MCV: 87.3 fL (ref 78.0–100.0)
Platelets: 157 10*3/uL (ref 150–400)
RBC: 4.41 MIL/uL (ref 4.22–5.81)
RDW: 14.3 % (ref 11.5–15.5)
WBC: 14.1 10*3/uL — ABNORMAL HIGH (ref 4.0–10.5)

## 2013-07-07 LAB — GLUCOSE, CAPILLARY: Glucose-Capillary: 115 mg/dL — ABNORMAL HIGH (ref 70–99)

## 2013-07-07 SURGERY — ESOPHAGECTOMY, TOTAL
Anesthesia: General | Site: Mouth | Wound class: Contaminated

## 2013-07-07 MED ORDER — SODIUM CHLORIDE 0.9 % IV SOLN
2500.0000 ug | INTRAVENOUS | Status: DC | PRN
  Administered 2013-07-07: 100 ug/h via INTRAVENOUS

## 2013-07-07 MED ORDER — CEFOXITIN SODIUM 2 G IV SOLR
2.0000 g | Freq: Four times a day (QID) | INTRAVENOUS | Status: AC
  Administered 2013-07-07 – 2013-07-08 (×4): 2 g via INTRAVENOUS
  Filled 2013-07-07 (×4): qty 2

## 2013-07-07 MED ORDER — DEXMEDETOMIDINE HCL IN NACL 200 MCG/50ML IV SOLN
INTRAVENOUS | Status: DC | PRN
  Administered 2013-07-07: .7 ug/kg/h via INTRAVENOUS

## 2013-07-07 MED ORDER — FENTANYL CITRATE 0.05 MG/ML IJ SOLN
25.0000 ug | INTRAMUSCULAR | Status: DC | PRN
  Administered 2013-07-07 – 2013-07-08 (×6): 25 ug via INTRAVENOUS
  Filled 2013-07-07 (×3): qty 2

## 2013-07-07 MED ORDER — MIDAZOLAM HCL 5 MG/5ML IJ SOLN
INTRAMUSCULAR | Status: DC | PRN
  Administered 2013-07-07 (×2): 2 mg via INTRAVENOUS

## 2013-07-07 MED ORDER — FENTANYL 10 MCG/ML IV SOLN
INTRAVENOUS | Status: DC
  Administered 2013-07-07 (×2): 30 ug via INTRAVENOUS
  Administered 2013-07-07: 270 ug via INTRAVENOUS
  Administered 2013-07-08: 269.6 ug via INTRAVENOUS
  Administered 2013-07-08: 16:00:00 via INTRAVENOUS
  Administered 2013-07-08: 180 ug via INTRAVENOUS
  Administered 2013-07-08: 360 ug via INTRAVENOUS
  Administered 2013-07-08: 08:00:00 via INTRAVENOUS
  Administered 2013-07-08: 285 ug via INTRAVENOUS
  Administered 2013-07-08: 01:00:00 via INTRAVENOUS
  Administered 2013-07-08: 330 ug via INTRAVENOUS
  Administered 2013-07-08: via INTRAVENOUS
  Administered 2013-07-09: 195 ug via INTRAVENOUS
  Administered 2013-07-09: 239.2 ug via INTRAVENOUS
  Administered 2013-07-09: 240 ug via INTRAVENOUS
  Administered 2013-07-09: 18:00:00 via INTRAVENOUS
  Administered 2013-07-09: 125 ug via INTRAVENOUS
  Administered 2013-07-09: 132.5 ug via INTRAVENOUS
  Administered 2013-07-09: 255 ug via INTRAVENOUS
  Administered 2013-07-10: 165 ug via INTRAVENOUS
  Administered 2013-07-10: 120 ug via INTRAVENOUS
  Administered 2013-07-10: 150 ug via INTRAVENOUS
  Administered 2013-07-10: 210 ug via INTRAVENOUS
  Administered 2013-07-10: 135 ug via INTRAVENOUS
  Administered 2013-07-10: 20:00:00 via INTRAVENOUS
  Administered 2013-07-10: 144 ug via INTRAVENOUS
  Administered 2013-07-11: 08:00:00 via INTRAVENOUS
  Administered 2013-07-11: 105 ug via INTRAVENOUS
  Administered 2013-07-11: 135 ug via INTRAVENOUS
  Administered 2013-07-11 (×2): 180 ug via INTRAVENOUS
  Administered 2013-07-11: 75 ug via INTRAVENOUS
  Administered 2013-07-11: 22:00:00 via INTRAVENOUS
  Administered 2013-07-11: 240 ug via INTRAVENOUS
  Administered 2013-07-12: 90 ug via INTRAVENOUS
  Administered 2013-07-12: 165 ug via INTRAVENOUS
  Administered 2013-07-12: 210 ug via INTRAVENOUS
  Filled 2013-07-07 (×11): qty 50

## 2013-07-07 MED ORDER — SODIUM CHLORIDE 0.9 % IV SOLN
25.0000 ug/h | INTRAVENOUS | Status: DC
  Filled 2013-07-07: qty 50

## 2013-07-07 MED ORDER — ONDANSETRON HCL 4 MG/2ML IJ SOLN: 4.0000 mg | Freq: Four times a day (QID) | INTRAMUSCULAR | Status: DC | PRN

## 2013-07-07 MED ORDER — FENTANYL BOLUS VIA INFUSION: 25.0000 ug | Freq: Four times a day (QID) | INTRAVENOUS | Status: DC | PRN

## 2013-07-07 MED ORDER — PHENYLEPHRINE HCL 10 MG/ML IJ SOLN
INTRAMUSCULAR | Status: DC | PRN
  Administered 2013-07-07: 40 ug via INTRAVENOUS

## 2013-07-07 MED ORDER — DIPHENHYDRAMINE HCL 50 MG/ML IJ SOLN: 12.5000 mg | Freq: Four times a day (QID) | INTRAMUSCULAR | Status: DC | PRN

## 2013-07-07 MED ORDER — NALOXONE HCL 0.4 MG/ML IJ SOLN: 0.4000 mg | INTRAMUSCULAR | Status: DC | PRN

## 2013-07-07 MED ORDER — PROPOFOL 10 MG/ML IV BOLUS
INTRAVENOUS | Status: DC | PRN
  Administered 2013-07-07: 130 mg via INTRAVENOUS

## 2013-07-07 MED ORDER — 0.9 % SODIUM CHLORIDE (POUR BTL) OPTIME
TOPICAL | Status: DC | PRN
  Administered 2013-07-07: 2000 mL

## 2013-07-07 MED ORDER — VECURONIUM BROMIDE 10 MG IV SOLR
INTRAVENOUS | Status: DC | PRN
  Administered 2013-07-07 (×6): 2 mg via INTRAVENOUS
  Administered 2013-07-07: 1 mg via INTRAVENOUS
  Administered 2013-07-07: 5 mg via INTRAVENOUS
  Administered 2013-07-07: 2 mg via INTRAVENOUS

## 2013-07-07 MED ORDER — METOCLOPRAMIDE HCL 5 MG/ML IJ SOLN
10.0000 mg | Freq: Four times a day (QID) | INTRAMUSCULAR | Status: AC
  Administered 2013-07-07 – 2013-07-08 (×3): 10 mg via INTRAVENOUS
  Filled 2013-07-07 (×3): qty 2

## 2013-07-07 MED ORDER — DEXMEDETOMIDINE HCL IN NACL 400 MCG/100ML IV SOLN
0.4000 ug/kg/h | INTRAVENOUS | Status: DC
  Filled 2013-07-07: qty 100

## 2013-07-07 MED ORDER — ALBUTEROL SULFATE (5 MG/ML) 0.5% IN NEBU
2.5000 mg | INHALATION_SOLUTION | Freq: Four times a day (QID) | RESPIRATORY_TRACT | Status: DC
  Administered 2013-07-07 – 2013-07-08 (×5): 2.5 mg via RESPIRATORY_TRACT
  Filled 2013-07-07 (×5): qty 0.5

## 2013-07-07 MED ORDER — LACTATED RINGERS IV SOLN
INTRAVENOUS | Status: DC | PRN
  Administered 2013-07-07: 08:00:00 via INTRAVENOUS

## 2013-07-07 MED ORDER — POTASSIUM CHLORIDE 10 MEQ/50ML IV SOLN
10.0000 meq | INTRAVENOUS | Status: DC | PRN
  Administered 2013-07-08 (×3): 10 meq via INTRAVENOUS
  Filled 2013-07-07: qty 50
  Filled 2013-07-07: qty 150

## 2013-07-07 MED ORDER — ALBUMIN HUMAN 5 % IV SOLN
INTRAVENOUS | Status: DC | PRN
  Administered 2013-07-07 (×2): via INTRAVENOUS

## 2013-07-07 MED ORDER — ONDANSETRON HCL 4 MG/2ML IJ SOLN: 4.0000 mg | INTRAMUSCULAR | Status: DC | PRN

## 2013-07-07 MED ORDER — DEXTROSE-NACL 5-0.9 % IV SOLN
INTRAVENOUS | Status: DC
  Administered 2013-07-07 – 2013-07-08 (×3): via INTRAVENOUS

## 2013-07-07 MED ORDER — DIPHENHYDRAMINE HCL 12.5 MG/5ML PO ELIX
12.5000 mg | ORAL_SOLUTION | Freq: Four times a day (QID) | ORAL | Status: DC | PRN
  Filled 2013-07-07: qty 5

## 2013-07-07 MED ORDER — HYDROMORPHONE HCL PF 1 MG/ML IJ SOLN
INTRAMUSCULAR | Status: DC | PRN
  Administered 2013-07-07: 1 mg via INTRAVENOUS

## 2013-07-07 MED ORDER — PANTOPRAZOLE SODIUM 40 MG IV SOLR
40.0000 mg | Freq: Two times a day (BID) | INTRAVENOUS | Status: DC
  Administered 2013-07-07 – 2013-07-17 (×20): 40 mg via INTRAVENOUS
  Filled 2013-07-07 (×23): qty 40

## 2013-07-07 MED ORDER — SODIUM CHLORIDE 0.9 % IJ SOLN
9.0000 mL | INTRAMUSCULAR | Status: DC | PRN
  Administered 2013-07-10 (×2): 10 mL via INTRAVENOUS

## 2013-07-07 MED ORDER — ACETAMINOPHEN 10 MG/ML IV SOLN
1000.0000 mg | Freq: Four times a day (QID) | INTRAVENOUS | Status: AC
  Administered 2013-07-07 – 2013-07-08 (×4): 1000 mg via INTRAVENOUS
  Filled 2013-07-07 (×5): qty 100

## 2013-07-07 MED ORDER — ROCURONIUM BROMIDE 100 MG/10ML IV SOLN
INTRAVENOUS | Status: DC | PRN
  Administered 2013-07-07: 50 mg via INTRAVENOUS
  Administered 2013-07-07: 20 mg via INTRAVENOUS
  Administered 2013-07-07: 10 mg via INTRAVENOUS
  Administered 2013-07-07: 20 mg via INTRAVENOUS

## 2013-07-07 MED ORDER — FENTANYL CITRATE 0.05 MG/ML IJ SOLN
INTRAMUSCULAR | Status: DC | PRN
  Administered 2013-07-07 (×6): 100 ug via INTRAVENOUS
  Administered 2013-07-07: 50 ug via INTRAVENOUS
  Administered 2013-07-07 (×4): 100 ug via INTRAVENOUS
  Administered 2013-07-07: 150 ug via INTRAVENOUS
  Administered 2013-07-07: 100 ug via INTRAVENOUS

## 2013-07-07 MED ORDER — LIDOCAINE HCL (CARDIAC) 20 MG/ML IV SOLN
INTRAVENOUS | Status: DC | PRN
  Administered 2013-07-07: 60 mg via INTRAVENOUS

## 2013-07-07 MED ORDER — SODIUM CHLORIDE 0.9 % IJ SOLN
10.0000 mL | Freq: Two times a day (BID) | INTRAMUSCULAR | Status: DC
  Administered 2013-07-07 – 2013-07-16 (×13): 10 mL via INTRAVENOUS

## 2013-07-07 MED ORDER — LACTATED RINGERS IV SOLN
INTRAVENOUS | Status: DC | PRN
  Administered 2013-07-07 (×4): via INTRAVENOUS

## 2013-07-07 SURGICAL SUPPLY — 128 items
ADH SKN CLS APL DERMABOND .7 (GAUZE/BANDAGES/DRESSINGS) ×4
ADH SKN CLS LQ APL DERMABOND (GAUZE/BANDAGES/DRESSINGS) ×2
BANDAGE HEMOSTAT MRDH 4X4 STRL (MISCELLANEOUS) IMPLANT
BNDG HEMOSTAT MRDH 4X4 STRL (MISCELLANEOUS)
BRUSH CYTOL CELLEBRITY 1.5X140 (MISCELLANEOUS) IMPLANT
CANISTER SUCTION 2500CC (MISCELLANEOUS) ×6 IMPLANT
CATH FOLEY 2WAY SLVR 18FR 30CC (CATHETERS) ×4 IMPLANT
CATH ROBINSON RED A/P 18FR (CATHETERS) ×3 IMPLANT
CATH THORACIC 28FR (CATHETERS) ×1 IMPLANT
CATH THORACIC 36FR (CATHETERS) IMPLANT
CATH THORACIC 36FR RT ANG (CATHETERS) IMPLANT
CLIP FOGARTY SPRING 6M (CLIP) ×3 IMPLANT
CLIP TI LARGE 6 (CLIP) IMPLANT
CLIP TI MEDIUM 24 (CLIP) ×3 IMPLANT
CLIP TI WIDE RED SMALL 24 (CLIP) ×4 IMPLANT
CLOTH BEACON ORANGE TIMEOUT ST (SAFETY) ×3 IMPLANT
CONT SPEC 4OZ CLIKSEAL STRL BL (MISCELLANEOUS) ×4 IMPLANT
COVER SURGICAL LIGHT HANDLE (MISCELLANEOUS) ×3 IMPLANT
COVER TABLE BACK 60X90 (DRAPES) ×3 IMPLANT
DERMABOND ADHESIVE PROPEN (GAUZE/BANDAGES/DRESSINGS) ×1
DERMABOND ADVANCED (GAUZE/BANDAGES/DRESSINGS) ×2
DERMABOND ADVANCED .7 DNX12 (GAUZE/BANDAGES/DRESSINGS) ×4 IMPLANT
DERMABOND ADVANCED .7 DNX6 (GAUZE/BANDAGES/DRESSINGS) IMPLANT
DRAIN PENROSE 1/2X36 STERILE (WOUND CARE) ×3 IMPLANT
DRAIN PENROSE 1/4X12 LTX STRL (WOUND CARE) ×1 IMPLANT
DRAIN SUMP SARATOGA 24F (WOUND CARE) ×3 IMPLANT
DRAPE BILATERAL SPLIT (DRAPES) ×3 IMPLANT
DRAPE CAMERA VIDEO/LASER (DRAPES) ×2 IMPLANT
DRAPE CV SPLIT W-CLR ANES SCRN (DRAPES) ×3 IMPLANT
DRAPE INCISE IOBAN 66X45 STRL (DRAPES) ×5 IMPLANT
DRAPE SLUSH/WARMER DISC (DRAPES) ×3 IMPLANT
DRAPE STERI WOUND 35X35 8 5/8 (DRAPES) ×3 IMPLANT
DRSG COVADERM 4X14 (GAUZE/BANDAGES/DRESSINGS) ×3 IMPLANT
ELECT BLADE 4.0 EZ CLEAN MEGAD (MISCELLANEOUS) ×3
ELECT BLADE 6.5 EXT (BLADE) ×3 IMPLANT
ELECT NDL TIP 2.8 STRL (NEEDLE) ×2 IMPLANT
ELECT NEEDLE TIP 2.8 STRL (NEEDLE) ×6 IMPLANT
ELECT REM PT RETURN 9FT ADLT (ELECTROSURGICAL) ×6
ELECTRODE BLDE 4.0 EZ CLN MEGD (MISCELLANEOUS) ×2 IMPLANT
ELECTRODE REM PT RTRN 9FT ADLT (ELECTROSURGICAL) ×4 IMPLANT
FORCEPS BIOP RJ4 1.8 (CUTTING FORCEPS) IMPLANT
GLOVE BIO SURGEON STRL SZ 6.5 (GLOVE) ×11 IMPLANT
GLOVE BIO SURGEON STRL SZ7.5 (GLOVE) ×4 IMPLANT
GLOVE BIOGEL PI IND STRL 7.0 (GLOVE) IMPLANT
GLOVE BIOGEL PI INDICATOR 7.0 (GLOVE) ×10
GOWN STRL NON-REIN LRG LVL3 (GOWN DISPOSABLE) ×10 IMPLANT
HANDLE STAPLE ENDO GIA SHORT (STAPLE) ×1
HEMOSTAT SURGICEL 2X14 (HEMOSTASIS) IMPLANT
INSERT FOGARTY 61MM (MISCELLANEOUS) IMPLANT
KIT BASIN OR (CUSTOM PROCEDURE TRAY) ×3 IMPLANT
KIT ROOM TURNOVER OR (KITS) ×3 IMPLANT
KIT SUCTION CATH 14FR (SUCTIONS) ×2 IMPLANT
LIGASURE 5MM LAPAROSCOPIC (INSTRUMENTS) ×1 IMPLANT
LIGASURE IMPACT 36 18CM CVD LR (INSTRUMENTS) ×3 IMPLANT
LOOP VESSEL MAXI BLUE (MISCELLANEOUS) ×1 IMPLANT
LOOP VESSEL MINI RED (MISCELLANEOUS) IMPLANT
MARKER SKIN DUAL TIP RULER LAB (MISCELLANEOUS) ×2 IMPLANT
NDL BIOPSY TRANSBRONCH 21G (NEEDLE) IMPLANT
NEEDLE BIOPSY TRANSBRONCH 21G (NEEDLE) IMPLANT
NS IRRIG 1000ML POUR BTL (IV SOLUTION) ×6 IMPLANT
OIL SILICONE PENTAX (PARTS (SERVICE/REPAIRS)) ×2 IMPLANT
PACK CHEST (CUSTOM PROCEDURE TRAY) ×3 IMPLANT
PAD ARMBOARD 7.5X6 YLW CONV (MISCELLANEOUS) ×6 IMPLANT
PAD SHARPS MAGNETIC DISPOSAL (MISCELLANEOUS) ×3 IMPLANT
PENCIL BUTTON HOLSTER BLD 10FT (ELECTRODE) IMPLANT
PLUG CATH AND CAP STER (CATHETERS) ×3 IMPLANT
PROBE NERVBE PRASS .33 (MISCELLANEOUS) ×3 IMPLANT
RELOAD EGIA 45 MED/THCK PURPLE (STAPLE) ×1 IMPLANT
RELOAD EGIA TRIS TAN 45 CVD (STAPLE) ×3 IMPLANT
RELOAD ENDO GIA 30 3.5 (STAPLE) ×2 IMPLANT
RELOAD LINEAR CUT PROX 55 BLUE (ENDOMECHANICALS) ×15 IMPLANT
RELOAD STAPLE 45 TAN MED CVD (STAPLE) IMPLANT
RELOAD STAPLE 55 3.8 BLU REG (ENDOMECHANICALS) ×6 IMPLANT
RETAINER VISCERA MED (MISCELLANEOUS) ×3 IMPLANT
SPONGE GAUZE 4X4 12PLY (GAUZE/BANDAGES/DRESSINGS) ×4 IMPLANT
SPONGE LAP 18X18 X RAY DECT (DISPOSABLE) ×8 IMPLANT
SPONGE TONSIL 1.25 RF SGL STRG (GAUZE/BANDAGES/DRESSINGS) IMPLANT
STAPLER ENDO GIA 12 SHRT THIN (STAPLE) ×2 IMPLANT
STAPLER ENDO GIA 12MM SHORT (STAPLE) ×2 IMPLANT
STAPLER PROXIMATE 55 BLUE (STAPLE) ×4 IMPLANT
STAPLER VISISTAT 35W (STAPLE) ×3 IMPLANT
SUCTION POOLE TIP (SUCTIONS) ×1 IMPLANT
SUT ETHILON 2 0 FS 18 (SUTURE) ×2 IMPLANT
SUT ETHILON 3 0 FSL (SUTURE) ×5 IMPLANT
SUT PDS AB 4-0 SH 27 (SUTURE) IMPLANT
SUT PROLENE 0 CT 1 CR/8 (SUTURE) ×1 IMPLANT
SUT PROLENE 1 XLH (SUTURE) ×4 IMPLANT
SUT PROLENE 2 0 MH 48 (SUTURE) IMPLANT
SUT PROLENE 2 TP 1 (SUTURE) ×6 IMPLANT
SUT PROLENE 3 0 RB 1 (SUTURE) ×2 IMPLANT
SUT PROLENE 4 0 RB 1 (SUTURE)
SUT PROLENE 4-0 RB1 .5 CRCL 36 (SUTURE) IMPLANT
SUT SILK  1 MH (SUTURE) ×1
SUT SILK 1 MH (SUTURE) ×2 IMPLANT
SUT SILK 1 TIES 10X30 (SUTURE) ×2 IMPLANT
SUT SILK 2 0 SH CR/8 (SUTURE) ×3 IMPLANT
SUT SILK 2 0SH CR/8 30 (SUTURE) ×3 IMPLANT
SUT SILK 3 0 SH CR/8 (SUTURE) ×1 IMPLANT
SUT SILK 3 0SH CR/8 30 (SUTURE) IMPLANT
SUT SILK 4 0 SH CR/8 (SUTURE) ×5 IMPLANT
SUT VIC AB 1 CTX 27 (SUTURE) IMPLANT
SUT VIC AB 2-0 CT1 18 (SUTURE) ×1 IMPLANT
SUT VIC AB 2-0 CTX 36 (SUTURE) ×3 IMPLANT
SUT VIC AB 3-0 MH 27 (SUTURE) IMPLANT
SUT VIC AB 3-0 SH 18 (SUTURE) ×1 IMPLANT
SUT VIC AB 3-0 SH 8-18 (SUTURE) ×1 IMPLANT
SUT VIC AB 3-0 X1 27 (SUTURE) ×6 IMPLANT
SUT VIC AB 4-0 SH 18 (SUTURE) ×9 IMPLANT
SUT VICRYL 2 TP 1 (SUTURE) IMPLANT
SYR 20CC LL (SYRINGE) IMPLANT
SYR 20ML ECCENTRIC (SYRINGE) ×2 IMPLANT
SYR 30ML SLIP (SYRINGE) ×3 IMPLANT
SYR 5ML LUER SLIP (SYRINGE) ×3 IMPLANT
SYR TOOMEY 50ML (SYRINGE) ×3 IMPLANT
SYRINGE 10CC LL (SYRINGE) IMPLANT
SYSTEM SAHARA CHEST DRAIN RE-I (WOUND CARE) ×3 IMPLANT
TAPE CLOTH SURG 4X10 WHT LF (GAUZE/BANDAGES/DRESSINGS) ×1 IMPLANT
TAPE UMBILICAL 1/8 X36 TWILL (MISCELLANEOUS) ×1 IMPLANT
TOWEL OR 17X24 6PK STRL BLUE (TOWEL DISPOSABLE) ×6 IMPLANT
TOWEL OR 17X26 10 PK STRL BLUE (TOWEL DISPOSABLE) ×7 IMPLANT
TRAP SPECIMEN MUCOUS 40CC (MISCELLANEOUS) ×2 IMPLANT
TRAY FOLEY CATH 14FRSI W/METER (CATHETERS) ×3 IMPLANT
TUBE CONNECTING 12X1/4 (SUCTIONS) ×5 IMPLANT
TUBE ENDOTRAC EMG 7X10.2 (MISCELLANEOUS) IMPLANT
TUBE ENDOTRAC EMG 8X11.3 (MISCELLANEOUS) ×1 IMPLANT
TUBE ENDOTRACH  EMG 6MMTUBE EN (MISCELLANEOUS)
TUBE ENDOTRACH EMG 6MMTUBE EN (MISCELLANEOUS) IMPLANT
WATER STERILE IRR 1000ML POUR (IV SOLUTION) ×4 IMPLANT

## 2013-07-07 NOTE — Brief Op Note (Addendum)
07/07/2013  2:39 PM  PATIENT:  Joel Osborne  47 y.o. male  PRE-OPERATIVE DIAGNOSIS:  Cancer of Esophagus  POST-OPERATIVE DIAGNOSIS:  Cancer of Esophagus  PROCEDURE:  Procedure(s) with comments: ESOPHAGECTOMY COMPLETE (N/A) - transhiatel esophagectomy, feeding jejunostomy VIDEO BRONCHOSCOPY (N/A)  SURGEON:  Surgeon(s) and Role:    * Delight Ovens, MD - Primary  PHYSICIAN ASSISTANT: Gershon Crane PA-C   ANESTHESIA:   general  EBL:  Total I/O In: 5000 [I.V.:4500; IV Piggyback:500] Out: 1120 [Urine:520; Blood:600]  BLOOD ADMINISTERED:none  DRAINS: penrose in left neck , left chest tube, jtube   LOCAL MEDICATIONS USED:  NONE  SPECIMEN:  Source of Specimen:  esophagectomy  DISPOSITION OF SPECIMEN:  PATHOLOGY  COUNTS:  YES  TOURNIQUET:  * No tourniquets in log *  DICTATION: .Other Dictation: Dictation Number [id number]  PLAN OF CARE: Admit to inpatient   PATIENT DISPOSITION:  ICU - intubated and hemodynamically stable.   Delay start of Pharmacological VTE agent (>24hrs) due to surgical blood loss or risk of bleeding: no Frozen: margins negative

## 2013-07-07 NOTE — Procedures (Signed)
Extubation Procedure Note  Patient Details:   Name: Joel Osborne DOB: 1966-09-05 MRN: [id number]   Airway Documentation:     Evaluation  O2 sats: stable throughout and currently acceptable Complications: No apparent complications Patient did tolerate procedure well. Bilateral Breath Sounds: Diminished;Rhonchi   Yes  NIF -60, VC .75  Prior to extubation pt suctioned orally, via ETT, and positive cuff leak noted.  Post-extubation pt able to state his name, cough and produce sputum, no stridor noted.  Antoine Poche 07/07/2013, 5:23 PM

## 2013-07-07 NOTE — Anesthesia Preprocedure Evaluation (Addendum)
Anesthesia Evaluation  Patient identified by MRN, date of birth, ID band Patient awake    Reviewed: Allergy & Precautions, H&P , NPO status , Patient's Chart, lab work & pertinent test results  Airway Mallampati: III TM Distance: >3 FB Neck ROM: Full    Dental  (+) Teeth Intact   Pulmonary neg pulmonary ROS,  breath sounds clear to auscultation        Cardiovascular Rhythm:Regular Rate:Normal     Neuro/Psych    GI/Hepatic GERD-  ,  Endo/Other  diabetes (borderline, on no diabetes meds)  Renal/GU      Musculoskeletal   Abdominal   Peds  Hematology   Anesthesia Other Findings   Reproductive/Obstetrics                         Anesthesia Physical Anesthesia Plan  ASA: III  Anesthesia Plan: General   Post-op Pain Management:    Induction: Intravenous  Airway Management Planned: Oral ETT  Additional Equipment: Arterial line and CVP  Intra-op Plan:   Post-operative Plan: Possible Post-op intubation/ventilation  Informed Consent: I have reviewed the patients History and Physical, chart, labs and discussed the procedure including the risks, benefits and alternatives for the proposed anesthesia with the patient or authorized representative who has indicated his/her understanding and acceptance.   Dental advisory given  Plan Discussed with: Anesthesiologist, CRNA and Surgeon  Anesthesia Plan Comments:        Anesthesia Quick Evaluation

## 2013-07-07 NOTE — OR Nursing (Signed)
First call to SICU at 1320, second call to SICU at 1426

## 2013-07-07 NOTE — Anesthesia Postprocedure Evaluation (Signed)
  Anesthesia Post-op Note  Patient: Joel Osborne  Procedure(s) Performed: Procedure(s) with comments: ESOPHAGECTOMY COMPLETE (N/A) - transhiatel esophagectomy, feeding jejunostomy VIDEO BRONCHOSCOPY (N/A)  Patient Location:SICU  Anesthesia Type:General  Level of Consciousness: Patient remains intubated per anesthesia plan  Airway and Oxygen Therapy: Patient remains intubated per anesthesia plan  Post-op Pain: mild  Post-op Assessment: Post-op Vital signs reviewed  Post-op Vital Signs: Reviewed  Complications: No apparent anesthesia complications

## 2013-07-07 NOTE — Transfer of Care (Signed)
Immediate Anesthesia Transfer of Care Note  Patient: Joel Osborne  Procedure(s) Performed: Procedure(s) with comments: ESOPHAGECTOMY COMPLETE (N/A) - transhiatel esophagectomy, feeding jejunostomy VIDEO BRONCHOSCOPY (N/A)  Patient Location: SICU  Anesthesia Type:General  Level of Consciousness: Patient remains intubated per anesthesia plan  Airway & Oxygen Therapy: Patient remains intubated per anesthesia plan and Patient placed on Ventilator (see vital sign flow sheet for setting)  Post-op Assessment: Report given to PACU RN and Post -op Vital signs reviewed and stable  Post vital signs: Reviewed and stable  Complications: No apparent anesthesia complications

## 2013-07-07 NOTE — Progress Notes (Signed)
Reported off care to oncoming nurse, Darcy,RN. Safety maintained.

## 2013-07-07 NOTE — H&P (Signed)
[address]       [place] [address]             [phone]                      Joel Osborne [place] Medical Record #[id number] Date of Birth: Dec 02, 1966  Referring: Ladene Artist, MD Primary Care: Garlan Fillers, MD  Chief Complaint:      Patient presents with  . Esophageal Cancer    f/u to discuss PET Scan and MRI from 06/09/13 and planned esophageal resection    History of Present Illness:    Patient is 47 yo who  began an intentional weight loss program at Thanksgiving of last year and has lost greater than 30 pounds. He noted  3 to four-month history of solid dysphagia.  He saw Dr. Jarold Motto and an esophagram on 03/06/2013 revealed a large irregular filling defect within the distal esophagus a second smaller persistent filling defect was noted in the mid to distal thoracic esophagus below the level of the carina.    An upper endoscopy on 03/18/2013 revealed a bulky friable mass at the distal 6 cm of the esophagus and involving a gastric cardia. The mass was obstructive. Retroflexed view in the stomach revealed the ulcerative mass in the cardia. The remainder of the stomach was normal. The duodenal bulb and postbulbar duodenum are normal. A biopsy confirmed adenocarcinoma. HER-2/NEU BY FISH - NO AMPLIFICATION OF HER-2 DETECTED. Accession: [id number]  He was referred for staging CTs of the chest, abdomen, and pelvis on 03/20/2013. A lower right paratracheal node measured 11 mm. A high right paratracheal node measured 8 mm. Small left periaortic nodes were also seen. No evidence of pulmonary metastases. An irregular mass was noted at the distal esophagus. 9 mm lymph node adjacent to the distal esophagus. No significant adenopathy in the gastrohepatic ligament. Small periportal nodes were felt to be benign. A low-density lesion in the inferior right liver measured 15 mm and had peripheral enhancement.    The patient now has completed course of  chemotherapy on May 27, and  radiation therapy. Is been tolerating therapy relatively well, and has been able to maintain his nutritional status. He notes he is eating better . Since completion of radiation and chemo therapy a MRI of the liver and PET scan has been done     Patient comes in today to proceed with  surgical resection.  Current Activity/ Functional Status:  Patient is independent with mobility/ambulation, transfers, ADL's, IADL's.  Zubrod Score: At the time of surgery this patient's most appropriate activity status/level should be described as: []  Normal activity, no symptoms [x]  Symptoms, fully ambulatory []  Symptoms, in bed less than or equal to 50% of the time []  Symptoms, in bed greater than 50% of the time but less than 100% []  Bedridden []  Moribund   Past Medical History  Diagnosis Date  . Hyperlipemia   . Colon polyp   . Pre-diabetes   . GERD (gastroesophageal reflux disease)   . Diabetes mellitus without complication   . Esophageal cancer 03/18/2013    Adenocarcinoma    Past Surgical History  Procedure Laterality Date  . Appendectomy    . Lipoma excision    . Edg  03/18/2013    Esophageal Mass    Family History  Problem Relation Age of Onset  . Colon cancer Mother     dx in her 49s  . Diabetes  Mother   . Breast cancer Maternal Aunt 81  . Diabetes Maternal Aunt   . Esophageal cancer Maternal Aunt 78  . Kidney failure Brother 64    History   Social History  . Marital Status: Married    Spouse Name: N/A    Number of Children: 2  . Years of Education: N/A   Occupational History  . Chiropractor    Social History Main Topics  . Smoking status: Never Smoker   . Smokeless tobacco: Never Used  . Alcohol Use: 1.2 oz/week    2 Cans of beer per week  . Drug Use: No  .         History  Smoking status  . Never Smoker   Smokeless tobacco  . Never Used    History  Alcohol Use  . 1.2 oz/week  . 2 Cans of beer per week     No Known  Allergies  Current Facility-Administered Medications  Medication Dose Route Frequency Provider Last Rate Last Dose  . cefUROXime (ZINACEF) 1.5 g in dextrose 5 % 50 mL IVPB  1.5 g Intravenous 60 min Pre-Op Delight Ovens, MD       Facility-Administered Medications Ordered in Other Encounters  Medication Dose Route Frequency Provider Last Rate Last Dose  . lactated ringers infusion    Continuous PRN Sheppard Evens, CRNA           Review of Systems:     Cardiac Review of Systems: Y or N  Chest Pain [ n   ]  Resting SOB [  n ] Exertional SOB  [n  ]  Orthopnea [ n ]   Pedal Edema [ n  ]    Palpitations [ n ] Syncope  [ n ]   Presyncope [n   ]  General Review of Systems: [Y] = yes [  ]=no Constitional: recent weight change Cove.Etienne  ]; anorexia [ y ]; fatigue [  ]; nausea [  ]; night sweats [  ]; fever [  ]; or chills [  ];                                                                                                                                          Dental: poor dentition[ n ]; Last Dentist visit:   Eye : blurred vision [  ]; diplopia [   ]; vision changes [  ];  Amaurosis fugax[  ]; Resp: cough [  ];  wheezing[  ];  hemoptysis[  ]; shortness of breath[  ]; paroxysmal nocturnal dyspnea[  ]; dyspnea on exertion[  ]; or orthopnea[  ];  GI:  gallstones[  ], vomiting[  ];  dysphagia[ y ]; melena[ n ];  hematochezia [n  ]; heartburn[n  ];   Hx of  Colonoscopy[y  ]; GU: kidney stones [  ]; hematuria[  ];   dysuria [  ];  nocturia[  ];  history of     obstruction [  ]; urinary frequency [  ]             Skin: rash, swelling[  ];, hair loss[  ];  peripheral edema[  ];  or itching[  ]; Musculosketetal: myalgias[  ];  joint swelling[  ];  joint erythema[  ];  joint pain[  ];  back pain[  ];  Heme/Lymph: bruising[  ];  bleeding[  ];  anemia[  ];  Neuro: TIA[  ];  headaches[  ];  stroke[  ];  vertigo[  ];  seizures[  ];   paresthesias[  ];  difficulty walking[  ];  Psych:depression[  ]; anxiety[   ];  Endocrine: diabetes[  ];  thyroid dysfunction[  ];  Immunizations: Flu [ n ]; Pneumococcal[ n ];  Other:  Physical Exam: BP 151/81  Pulse 82  Temp(Src) 98.1 F (36.7 C) (Oral)  Resp 20  SpO2 97%  General appearance: alert, cooperative, appears stated age and no distress Neurologic: intact Heart: regular rate and rhythm, S1, S2 normal, no murmur, click, rub or gallop and normal apical impulse Lungs: clear to auscultation bilaterally and normal percussion bilaterally Abdomen: soft, non-tender; bowel sounds normal; no masses,  no organomegaly Extremities: extremities normal, atraumatic, no cyanosis or edema, Homans sign is negative, no sign of DVT, no edema, redness or tenderness in the calves or thighs and no ulcers, gangrene or trophic changes There is no cervical supraclavicular or axillary adenopathy appreciated  Diagnostic Studies & Laboratory data:  Dg Chest 2 View  07/07/2013   *RADIOLOGY REPORT*  Clinical Data: Preoperative radiograph.  Esophagectomy.  CHEST - 2 VIEW  Comparison: None.  Findings:  Cardiopericardial silhouette within normal limits. Mediastinal contours normal. Trachea midline.  No airspace disease or effusion.  IMPRESSION: No active cardiopulmonary disease.   Original Report Authenticated By: Andreas Newport, M.D.    Mr Liver W Wo Contrast  06/09/2013   *RADIOLOGY REPORT*  Clinical Data:  Esophageal cancer, initially diagnosed March 2014. Liver lesion without corresponding F D G uptake on previous recent PET CT.  MRI ABDOMEN WITHOUT AND WITH CONTRAST  Technique:  Multiplanar multisequence MR imaging of the abdomen was performed both before and after the administration of intravenous contrast.  Contrast: 20mL MULTIHANCE GADOBENATE DIMEGLUMINE 529 MG/ML IV SOLN  Comparison:  PET CT 03/27/2013, diagnostic CT 03/20/2013 (  Findings:  Gallstones are noted.  Mass like filling defect within the distal esophagus is again noted but not as well seen as on prior dedicated  imaging.  Trace perisplenic fluid incidentally noted.  Bilateral T2 hyperintense renal cortical lesions without internal enhancement are noted, largest 1.3 cm left mid kidney, most likely cysts, unchanged.  Within the posterior segment right hepatic lobe, there is a 1.4 cm T2 hyperintense, T1 hypointense lesion with discontinuous peripheral nodular enhancement with progressive filling in on delayed images.  This corresponds to the finding on the previous exam and is compatible with a hemangioma. No abnormal T2 hyperintense or enhancing liver mass is identified. The spleen, pancreas, and adrenal glands are normal.  No common duct or intrahepatic ductal dilatation. Blooming artifact within the hollow viscus most likely reflects ingested oral material such as vitamin containing iron or other mineral.  IMPRESSION: Posterior segment right hepatic lobe hemangioma.  No MRI evidence for hepatic or other intra-abdominal metastatic disease.  Gallstones and renal cortical cysts again incidentally noted.  Distal esophageal mass corresponding to known esophageal cancer is re-identified.  Original Report Authenticated By: Christiana Pellant, M.D.   Nm Pet Image Restag (ps) Skull Base To Thigh  06/09/2013   *RADIOLOGY REPORT*  Clinical Data: Subsequent treatment strategy for esophageal carcinoma.  NUCLEAR MEDICINE PET SKULL BASE TO THIGH  Fasting Blood Glucose:  103  Technique:  20.6 mCi F-18 FDG was injected intravenously. CT data was obtained and used for attenuation correction and anatomic localization only.  (This was not acquired as a diagnostic CT examination.) Additional exam technical data entered on technologist worksheet.  Comparison:  03/27/2013  Findings:  Neck: No hypermetabolic lymph nodes in the neck.  Chest:  The distal esophageal mass shows mild decrease in the size as well as a decreased hypermetabolic activity.  Maximum SUV currently measures 10.9 compared with 12.3 on previous study.  Mild hypermetabolic  activity is also seen in the proximal thoracic esophagus, but no separate hypermetabolic mediastinal lymph nodes are identified on current exam. Previously suspected small hypermetabolic mediastinal lymph nodes are no longer seen.  There is no evidence of hypermetabolic hilar or axillary lymphadenopathy. CT images show no suspicious pulmonary nodules or masses.  Abdomen/Pelvis:  No abnormal hypermetabolic activity within the liver, pancreas, adrenal glands, or spleen.  No hypermetabolic lymph nodes in the abdomen or pelvis.  Skeleton:  No focal hypermetabolic activity to suggest skeletal metastasis.  IMPRESSION:  1.  Mild decrease in hypermetabolic activity associated with distal esophageal mass. 2.  Previously noted small hypermetabolic mediastinal lymph nodes no longer visualized.  No other signs of metastatic disease identified.   Original Report Authenticated By: Myles Rosenthal, M.D.    Ct Chest W Contrast  03/20/2013  *RADIOLOGY REPORT*  Clinical Data:  Esophageal mass, rule out metastasis.  Difficulty swallowing.  CT CHEST, ABDOMEN AND PELVIS WITH CONTRAST  Technique:  Multidetector CT imaging of the chest, abdomen and pelvis was performed following the standard protocol during bolus administration of intravenous contrast.  Contrast: OMNIPAQUE IOHEXOL 300 MG/ML  SOLN,  Comparison:  Esophagram 03/06/2013  CT CHEST  Findings:  No axillary or supraclavicular lymphadenopathy.  Right lower paratracheal lymph node measures 11 mm short axis (image 24). High right paratracheal lymph node measures 8 mm (image #11). Small left periaortic lymph nodes are also noted (image 20).  No subcarinal adenopathy.  No pericardial fluid.  Review of the lung parenchyma demonstrates no suspicious pulmonary nodules.  IMPRESSION:  1.  Paratracheal mediastinal  lymph nodes on the left and right while small are concerning for nodal metastasis. 2.  No evidence of pulmonary metastasis.  CT ABDOMEN AND PELVIS  Findings:  There is  irregular mass in the distal esophagus measuring 3.3 x 3.0 cm.  There is some thickening within the gastric cardiac region enhancement (image 53).  There is a 9 mm lymph node adjacent to the distal esophagus on the right (image 53).  No significant adenopathy in the gastrohepatic ligament. Small periportal lymph nodes are less than 10 mm and likely benign.  There is a low density lesion in the inferior right hepatic lobe measuring 15 mm (image 67). This has peripheral enhancement.  The gallbladder, pancreas, spleen, adrenal glands show no acute findings.  There are gallstones in the gallbladder.  The duodenum, small bowel, appendix, and cecum are normal.  The colon and rectosigmoid colon are normal.  Abdominal aorta normal caliber.  No retroperitoneal lymphadenopathy  No free fluid the pelvis.  The prostate gland bladder normal.  No pelvic lymphadenopathy. Review of  bone windows demonstrates no aggressive osseous lesions.  IMPRESSION:  1.  Distal esophageal mass and thickened gastric cardia concerning for primary esophageal/gastric carcinoma. 2.  Small distal para esophageal lymph nodes are concerning for local nodal metastasis. 3.  No evidence of periportal metastasis.  4.  Small hypodense lesion in the right hepatic lobe may represent benign hemangioma but is not fully characterized.  Further characterization could be achieved with contrast MRI or FDG PET scan.  5.  Please see chest section for concern for paratracheal nodal metastasis.   Original Report Authenticated By: Genevive Bi, M.D.    Ct Abdomen Pelvis W Contrast  03/20/2013  *RADIOLOGY REPORT*  Clinical Data:  Esophageal mass, rule out metastasis.  Difficulty swallowing.  CT CHEST, ABDOMEN AND PELVIS WITH CONTRAST  Technique:  Multidetector CT imaging of the chest, abdomen and pelvis was performed following the standard protocol during bolus administration of intravenous contrast.  Contrast: OMNIPAQUE IOHEXOL 300 MG/ML  SOLN,  Comparison:   Esophagram 03/06/2013  CT CHEST  Findings:  No axillary or supraclavicular lymphadenopathy.  Right lower paratracheal lymph node measures 11 mm short axis (image 24). High right paratracheal lymph node measures 8 mm (image #11). Small left periaortic lymph nodes are also noted (image 20).  No subcarinal adenopathy.  No pericardial fluid.  Review of the lung parenchyma demonstrates no suspicious pulmonary nodules.  IMPRESSION:  1.  Paratracheal mediastinal  lymph nodes on the left and right while small are concerning for nodal metastasis. 2.  No evidence of pulmonary metastasis.  CT ABDOMEN AND PELVIS  Findings:  There is irregular mass in the distal esophagus measuring 3.3 x 3.0 cm.  There is some thickening within the gastric cardiac region enhancement (image 53).  There is a 9 mm lymph node adjacent to the distal esophagus on the right (image 53).  No significant adenopathy in the gastrohepatic ligament. Small periportal lymph nodes are less than 10 mm and likely benign.  There is a low density lesion in the inferior right hepatic lobe measuring 15 mm (image 67). This has peripheral enhancement.  The gallbladder, pancreas, spleen, adrenal glands show no acute findings.  There are gallstones in the gallbladder.  The duodenum, small bowel, appendix, and cecum are normal.  The colon and rectosigmoid colon are normal.  Abdominal aorta normal caliber.  No retroperitoneal lymphadenopathy  No free fluid the pelvis.  The prostate gland bladder normal.  No pelvic lymphadenopathy. Review of  bone windows demonstrates no aggressive osseous lesions.  IMPRESSION:  1.  Distal esophageal mass and thickened gastric cardia concerning for primary esophageal/gastric carcinoma. 2.  Small distal para esophageal lymph nodes are concerning for local nodal metastasis. 3.  No evidence of periportal metastasis.  4.  Small hypodense lesion in the right hepatic lobe may represent benign hemangioma but is not fully characterized.  Further  characterization could be achieved with contrast MRI or FDG PET scan.  5.  Please see chest section for concern for paratracheal nodal metastasis.   Original Report Authenticated By: Genevive Bi, M.D.    Dg Esophagus  03/06/2013  *RADIOLOGY REPORT*  Clinical Data:Dysphagia.  ESOPHAGUS/BARIUM SWALLOW/TABLET STUDY  Fluoroscopy Time: 2.7 minutes.  Comparison: None.  Findings: Double contrast esophagram demonstrates a large irregular filling defect within the distal esophagus.  There is a second smaller persistent filling defect within the mid to distal thoracic esophagus just below the level of the carina.  Findings are concerning for masses/neoplasm.  Cannot completely exclude a component of retained/impacted food.  Contrast material does pass around this  mass-like filling defect.  Normal esophageal motility.  IMPRESSION: Large filling defect in the distal esophagus.  Smaller persistent filling defect more proximally in the mid to distal esophagus. Cannot exclude neoplasm.  Recommend further evaluation with endoscopy.  These results were discussed with Dr. Jarold Motto at the time of interpretation.   Original Report Authenticated By: Charlett Nose, M.D.    Nm Pet Image Initial (pi) Skull Base To Thigh  03/27/2013  *RADIOLOGY REPORT*  Clinical Data: Initial treatment strategy for esophageal cancer.  NUCLEAR MEDICINE PET SKULL BASE TO THIGH  Fasting Blood Glucose:  84  Technique:  18 mCi F-18 FDG was injected intravenously. CT data was obtained and used for attenuation correction and anatomic localization only.  (This was not acquired as a diagnostic CT examination.) Additional exam technical data entered on technologist worksheet.  Comparison:  03/20/2013  Findings:  Neck: No hypermetabolic lymph nodes in the neck.  Chest:  There is a new right paratracheal lymph node which measures 9 mm.  The SUV max associated with this lymph node is not well to 4.8.  Increased FDG uptake associated with the left paratracheal  lymph node has an SUV max equal to 6.7, image 79.  Posterior to the left mainstem bronchus is a short segment of intense FDG uptake associated with the esophagus.  The SUV max is equal to 8.9, image 89.   Within the distal portion of the esophagus there is a long segment of abnormal increased FDG uptake which extends into the gastric cardia.  This segment measures approximately 8.5 cm, and has an SUV max equal to 12.3.  At this level there is marked thickening of the esophageal wall.  Abdomen/Pelvis:  No abnormal hypermetabolic activity within the liver, pancreas, adrenal glands, or spleen.  No hypermetabolic lymph nodes in the abdomen or pelvis. Stones are noted within the dependent portion of the gallbladder.  Skeleton:  No focal hypermetabolic activity to suggest skeletal metastasis.  IMPRESSION:  1.  Multi focal area of hypermetabolic tumor involves the thoracic esophagus. Majority of the tumor involves the distal 8 cm of the esophagus.  2.  Hypermetabolic lymph nodes within the mediastinum which are worrisome for metastatic adenopathy. 3.  No evidence for distant metastatic disease.   Original Report Authenticated By: Signa Kell, M.D.     Recent Lab Findings: Lab Results  Component Value Date   WBC 8.2 07/03/2013   HGB 15.7 07/03/2013   HCT 45.3 07/03/2013   PLT 186 07/03/2013   GLUCOSE 105* 07/03/2013   ALT 20 07/03/2013   AST 16 07/03/2013   NA 140 07/03/2013   K 4.3 07/03/2013   CL 102 07/03/2013   CREATININE 0.85 07/03/2013   BUN 15 07/03/2013   CO2 27 07/03/2013   INR 0.94 07/03/2013      Assessment / Plan:    Advanced stage adenocarcinoma of distal esophageal, and cardia, likely nodal involvement with preop radiation and chemo therapy now completed MRI and PET scan show now evidence of metastatic disease. We will  proceed with esophagectomy, transhiatal and bronchoscopy on July 28 Monday.  I had a detailed discussion with Joel Osborne and his wife  regarding the magnitude of the  surgical esophagectomy procedure as well as the risks, the expected benefits, and alternatives.  I quoted Joel Osborne 5% perioperative mortality rate and a complication rate as high as 35%.  We specifically discussed complications, which include, but were not limited to: recurrent nerve injury with possible permanent hoarseness, anastomotic leak,  airway and great vessel injury, conduit ischemia, thoracic duct leak, the inability to complete the operation via a transhiatal approach requiring a right thoracotomy,  bleeding, need for blood transfusion and the potential need for ventilator support.  Joel Osborne has had questions answered is well informed and willing to proceed.   Delight Ovens MD  Beeper [phone] Office [phone]

## 2013-07-07 NOTE — Progress Notes (Signed)
0604   ABG drawn, and still needs CXR.(as per order)       DA

## 2013-07-07 NOTE — Progress Notes (Signed)
Patient ID: Joel Osborne, male   DOB: 12-May-1966, 47 y.o.   MRN: [id number] EVENING ROUNDS NOTE :     [address]       [name] [address]             [phone]                 Day of Surgery Procedure(s) (LRB): ESOPHAGECTOMY COMPLETE (N/A) VIDEO BRONCHOSCOPY (N/A)  Total Length of Stay:  LOS: 0 days  BP 130/70  Pulse 81  Temp(Src) 99.2 F (37.3 C) (Axillary)  Resp 15  Ht 6' (1.829 m)  Wt 222 lb (100.699 kg)  BMI 30.1 kg/m2  SpO2 98%  .Intake/Output     07/27 0701 - 07/28 0700 07/28 0701 - 07/29 0700   I.V. (mL/kg)  4890 (48.6)   IV Piggyback  600   Total Intake(mL/kg)  5490 (54.5)   Urine (mL/kg/hr)  805 (0.7)   Blood  600 (0.5)   Chest Tube  100 (0.1)   Total Output   1505   Net   +3985          . dextrose 5 % and 0.9% NaCl 125 mL/hr at 07/07/13 1554     Lab Results  Component Value Date   WBC 14.1* 07/07/2013   HGB 13.6 07/07/2013   HCT 38.5* 07/07/2013   PLT 157 07/07/2013   GLUCOSE 153* 07/07/2013   ALT 20 07/03/2013   AST 16 07/03/2013   NA 137 07/07/2013   K 4.7 07/07/2013   CL 104 07/07/2013   CREATININE 0.81 07/07/2013   BUN 12 07/07/2013   CO2 23 07/07/2013   INR 0.94 07/03/2013   Vs stable, extubated Good voice/  minimal drainage from ct  Delight Ovens MD  Beeper [phone] Office [phone] 07/07/2013 6:16 PM

## 2013-07-08 ENCOUNTER — Encounter (HOSPITAL_COMMUNITY): Payer: Self-pay | Admitting: Cardiothoracic Surgery

## 2013-07-08 ENCOUNTER — Inpatient Hospital Stay (HOSPITAL_COMMUNITY): Payer: BC Managed Care – PPO

## 2013-07-08 LAB — POCT I-STAT 3, ART BLOOD GAS (G3+)
Acid-base deficit: 1 mmol/L (ref 0.0–2.0)
Acid-base deficit: 2 mmol/L (ref 0.0–2.0)
Bicarbonate: 23.1 mEq/L (ref 20.0–24.0)
Bicarbonate: 23.8 mEq/L (ref 20.0–24.0)
O2 Saturation: 100 %
O2 Saturation: 93 %
Patient temperature: 99
TCO2: 24 mmol/L (ref 0–100)
TCO2: 25 mmol/L (ref 0–100)
pCO2 arterial: 35.9 mmHg (ref 35.0–45.0)
pCO2 arterial: 41.7 mmHg (ref 35.0–45.0)
pH, Arterial: 7.416 (ref 7.350–7.450)
pH, Arterial: 7.364 (ref 7.350–7.450)
pO2, Arterial: 218 mmHg — ABNORMAL HIGH (ref 80.0–100.0)
pO2, Arterial: 71 mmHg — ABNORMAL LOW (ref 80.0–100.0)

## 2013-07-08 LAB — BASIC METABOLIC PANEL
BUN: 7 mg/dL (ref 6–23)
CO2: 20 mEq/L (ref 19–32)
Calcium: 6.3 mg/dL — CL (ref 8.4–10.5)
GFR calc non Af Amer: >90 mL/min (ref >90)
Glucose, Bld: 122 mg/dL — ABNORMAL HIGH (ref 70–99)

## 2013-07-08 LAB — CALCIUM, IONIZED: Calcium, Ion: 1.18 mmol/L (ref 1.12–1.23)

## 2013-07-08 LAB — CBC
HCT: 37.6 % — ABNORMAL LOW (ref 39.0–52.0)
Hemoglobin: 13.2 g/dL (ref 13.0–17.0)
MCH: 31 pg (ref 26.0–34.0)
MCHC: 35.1 g/dL (ref 30.0–36.0)
RBC: 4.26 MIL/uL (ref 4.22–5.81)

## 2013-07-08 MED ORDER — KCL IN DEXTROSE-NACL 20-5-0.9 MEQ/L-%-% IV SOLN
INTRAVENOUS | Status: DC
  Administered 2013-07-08: 14:00:00 via INTRAVENOUS
  Administered 2013-07-08: 125 mL/h via INTRAVENOUS
  Administered 2013-07-09 – 2013-07-11 (×3): via INTRAVENOUS
  Filled 2013-07-08 (×7): qty 1000

## 2013-07-08 MED ORDER — ENOXAPARIN SODIUM 30 MG/0.3ML ~~LOC~~ SOLN
30.0000 mg | SUBCUTANEOUS | Status: DC
  Administered 2013-07-08 – 2013-07-09 (×2): 30 mg via SUBCUTANEOUS
  Filled 2013-07-08 (×4): qty 0.3

## 2013-07-08 MED ORDER — METOPROLOL TARTRATE 12.5 MG HALF TABLET
12.5000 mg | ORAL_TABLET | Freq: Two times a day (BID) | ORAL | Status: DC
  Filled 2013-07-08 (×2): qty 1

## 2013-07-08 MED ORDER — KETOROLAC TROMETHAMINE 15 MG/ML IJ SOLN
15.0000 mg | Freq: Four times a day (QID) | INTRAMUSCULAR | Status: AC
  Administered 2013-07-08 (×3): 15 mg via INTRAVENOUS
  Filled 2013-07-08 (×3): qty 1

## 2013-07-08 MED ORDER — METOPROLOL TARTRATE 25 MG/10 ML ORAL SUSPENSION
12.5000 mg | Freq: Two times a day (BID) | ORAL | Status: DC
  Administered 2013-07-08 – 2013-07-14 (×12): 12.5 mg
  Filled 2013-07-08 (×16): qty 5

## 2013-07-08 NOTE — Progress Notes (Addendum)
TCTS DAILY ICU PROGRESS NOTE                   [address]            [place] [address]          [phone]   1 Day Post-Op Procedure(s) (LRB): ESOPHAGECTOMY COMPLETE (N/A) VIDEO BRONCHOSCOPY (N/A)  Total Length of Stay:  LOS: 1 day   Subjective: C/O moderate pain, some breakthrough with pca  Objective: Vital signs in last 24 hours: Temp:  [97.5 F (36.4 C)-99.2 F (37.3 C)] 99 F (37.2 C) (07/29 0358) Pulse Rate:  [71-125] 125 (07/29 0700) Cardiac Rhythm:  [-] Sinus tachycardia (07/29 0400) Resp:  [7-20] 15 (07/29 0700) BP: (109-143)/(57-94) 117/72 mmHg (07/29 0500) SpO2:  [89 %-100 %] 94 % (07/29 0700) Arterial Line BP: (64-156)/(47-81) 156/81 mmHg (07/29 0700) FiO2 (%):  [40 %-99 %] 99 % (07/28 1800) Weight:  [229 lb 4.5 oz (104 kg)] 229 lb 4.5 oz (104 kg) (07/29 0700)  Filed Weights   07/07/13 0700 07/08/13 0700  Weight: 222 lb (100.699 kg) 229 lb 4.5 oz (104 kg)    Weight change: 7 lb 4.5 oz (3.301 kg)   Hemodynamic parameters for last 24 hours:    Intake/Output from previous day: 07/28 0701 - 07/29 0700 In: 9147 [I.V.:6581; IV Piggyback:1050] Out: 2910 [Urine:1930; Emesis/NG output:100; Blood:600; Chest Tube:280]  Intake/Output this shift:    Current Meds: Scheduled Meds: . acetaminophen  1,000 mg Intravenous Q6H  . albuterol  2.5 mg Nebulization Q6H  . cefOXitin  2 g Intravenous Q6H  . fentaNYL   Intravenous Q4H  . pantoprazole (PROTONIX) IV  40 mg Intravenous Q12H  . sodium chloride  10 mL Intravenous Q12H   Continuous Infusions: . dextrose 5 % and 0.9% NaCl 125 mL/hr at 07/07/13 1855   PRN Meds:.diphenhydrAMINE, diphenhydrAMINE, fentaNYL, naloxone, ondansetron (ZOFRAN) IV, potassium chloride, sodium chloride  General appearance: alert, cooperative, fatigued and no distress Heart: regular rate and rhythm Lungs: clear ant/lat Abdomen: soft, mod incis ttp, no BS Extremities: no edema Wound: dressings CDI  Lab  Results: CBC: Recent Labs  07/07/13 1500 07/08/13 0401  WBC 14.1* 12.1*  HGB 13.6 13.2  HCT 38.5* 37.6*  PLT 157 138*   BMET:  Recent Labs  07/07/13 1500 07/08/13 0401  NA 137 138  K 4.7 2.9*  CL 104 112  CO2 23 20  GLUCOSE 153* 122*  BUN 12 7  CREATININE 0.81 0.50  CALCIUM 8.2* 6.3*   ABG    Component Value Date/Time   PHART 7.364 07/08/2013 0402   PCO2ART 41.7 07/08/2013 0402   PO2ART 71.0* 07/08/2013 0402   HCO3 23.8 07/08/2013 0402   TCO2 25 07/08/2013 0402   ACIDBASEDEF 2.0 07/08/2013 0402   O2SAT 93.0 07/08/2013 0402       PT/INR: No results found for this basename: LABPROT, INR,  in the last 72 hours Radiology: Dg Chest 2 View   Dg Chest 2 View  07/07/2013   *RADIOLOGY REPORT*  Clinical Data: Preoperative radiograph.  Esophagectomy.  CHEST - 2 VIEW  Comparison: None.  Findings:  Cardiopericardial silhouette within normal limits. Mediastinal contours normal. Trachea midline.  No airspace disease or effusion.  IMPRESSION: No active cardiopulmonary disease.   Original Report Authenticated By: Joel Osborne, M.D.   Dg Chest Port 1 View  07/08/2013   *RADIOLOGY REPORT*  Clinical Data: Postop esophagectomy and gastric pull-through for esophageal cancer.  Extubation.  Follow up atelectasis  in the right upper lobe.  PORTABLE CHEST - 1 VIEW  Comparison: Portable chest x-ray yesterday and two-view chest x-ray yesterday.  Findings: Interval extubation. Left chest tube in place with no pneumothorax.  Right jugular central venous catheter tip projects over the mid SVC.  Nasogastric tube in the stomach.  Marked improvement in aeration in the right upper lobe, with mild atelectasis persisting. No change in the consolidation in the left lower lobe.  No new pulmonary parenchymal abnormalities.  IMPRESSION: Support apparatus satisfactory.  No pneumothorax.  Marked improvement in aeration in the right upper lobe since yesterday, with mild atelectasis persisting.  Stable left lower lobe  atelectasis and/or pneumonia.  No new abnormalities.   Original Report Authenticated By: Joel Osborne, M.D.      Assessment/Plan: S/P Procedure(s) (LRB): ESOPHAGECTOMY COMPLETE (N/A) VIDEO BRONCHOSCOPY (N/A)  1. Cont IVF, will add K+, will also receive 3 runs K+ for hypokalemia 2 PCA  Effective , but still breakthrough, add Toradol for a few doses 3 H/H good, very mild anemia- ABL- expected 4 hypocalcemia, ionized not checked, albumin 4.2 preop- monitor 5 keep chest tube 6 D/C a line' 7 add low dose lopressor 8 cbg's controlled pretty well Results for JAK, HAGGAR (MRN [id number]) as of 07/08/2013 07:28  Ref. Range 04/08/2013 08:49 04/29/2013 07:42 07/03/2013 09:50 07/07/2013 15:00 07/08/2013 04:01  Glucose Latest Range: 70-99 mg/dl 409 (H) 811 (H) 914 (H) 153 (H) 122 (H)  9 begin tube flushes- poss start J tube feeds tomorrow 10 routine pulm toilet/rehab    Joel Osborne 07/08/2013 7:28 AM  Start low dose beta blocker  Cont iv fluids Good voice, no obvious cord paralysis I have seen and examined Joel Osborne and agree with the above assessment  and plan.  Joel Ovens MD Beeper [phone] Office [phone] 07/08/2013 7:58 AM

## 2013-07-08 NOTE — Progress Notes (Signed)
TCTS BRIEF SICU PROGRESS NOTE  1 Day Post-Op  S/P Procedure(s) (LRB): ESOPHAGECTOMY COMPLETE (N/A) VIDEO BRONCHOSCOPY (N/A)   Stable day Sinus tach w/ stable BP O2 sats 96% on 2 L/min UOP adequate  Plan: Continue current plan  OWEN,CLARENCE H 07/08/2013 7:24 PM

## 2013-07-08 NOTE — Progress Notes (Signed)
UR Completed.  Pravin Perezperez Jane [phone] 07/08/2013  

## 2013-07-08 NOTE — Progress Notes (Signed)
INITIAL NUTRITION ASSESSMENT  DOCUMENTATION CODES Per approved criteria  -Obesity Unspecified   INTERVENTION: Once ready to initiate feedings, recommend initiation of Vital AF 1.2 via j-tube at 20 ml/h, advance by 10 ml q 8 hours to goal of 80 ml/h. Goal rate will provide: 2304 kcal, 144 grams protein, 1557 ml free water. RD to continue to follow nutrition care plan.  NUTRITION DIAGNOSIS: Inadequate oral intake related to inability to eat as evidenced by NPO status.   Goal: Initiate nutrition support; intake to meet at least 90% of estimated needs.  Monitor:  Initiation of nutrition support, weights, labs, I/O's  Reason for Assessment: Malnutrition Screening Tool  47 y.o. male  Admitting Dx: esophagectomy  ASSESSMENT: Underwent intentional wt loss program at Thanksgiving of last year; has lost >30 lb. Developed 3-4 month history of solid food dysphagia. Work-up revealed mass in the esophagus and stomach; bx confirmed adenocarcinoma. Completed chemo and radiation therapy at end of May. Has been able to maintain his nutritional status. Admitted for surgical resection.  Underwent transhiatal esophagectomy and placement of feeding j-tube on 7/28. Per most recent MD note, possible j-tube feedings to start tomorrow.  Pt reports that prior to his intentional weight loss, he was around 260 lb. Confirms that he began to lose weight unintentionally when dysphagia started. Limited diet throughout chemo and radiation per his report. Pt was followed [facility], Vernell Leep, throughout his treatment, notes reviewed.  His lowest weight was 216 lb, but has been able to maintain around 220 - 225 lb since that time.  Pt with questions re: jejunal feedings, RD answered questions to provide pt with basic understanding of how feedings work. Pt appreciative of information.  Height: Ht Readings from Last 1 Encounters:  07/07/13 6' (1.829 m)    Weight: Wt Readings from Last 1 Encounters:   07/08/13 229 lb 4.5 oz (104 kg)    Ideal Body Weight: 178 lb  % Ideal Body Weight: 129%  Wt Readings from Last 10 Encounters:  07/08/13 229 lb 4.5 oz (104 kg)  07/08/13 229 lb 4.5 oz (104 kg)  07/03/13 222 lb 14.2 oz (101.1 kg)  06/30/13 224 lb (101.606 kg)  06/10/13 220 lb (99.791 kg)  05/29/13 216 lb (97.977 kg)  05/20/13 216 lb (97.977 kg)  05/15/13 221 lb 4.8 oz (100.381 kg)  05/15/13 224 lb (101.606 kg)  05/09/13 224 lb 8 oz (101.833 kg)    Usual Body Weight: 255 - 260 lb in Oct 2013  % Usual Body Weight: 89%  BMI:  Body mass index is 31.09 kg/(m^2). Obese Class I  Estimated Nutritional Needs: Kcal: 2150 - 2400 Protein: 130 - 150 g Fluid: 2.1 - 2.4 liters  Skin:  Abdomen incision Neck incision  Diet Order: NPO  EDUCATION NEEDS: -No education needs identified at this time   Intake/Output Summary (Last 24 hours) at 07/08/13 0923 Last data filed at 07/08/13 0900  Gross per 24 hour  Intake 6957.96 ml  Output   3010 ml  Net 3947.96 ml    Last BM: PTA  Labs:   Recent Labs Lab 07/03/13 0950 07/07/13 1500 07/08/13 0401  NA 140 137 138  K 4.3 4.7 2.9*  CL 102 104 112  CO2 27 23 20   BUN 15 12 7   CREATININE 0.85 0.81 0.50  CALCIUM 9.8 8.2* 6.3*  GLUCOSE 105* 153* 122*    CBG (last 3)   Recent Labs  07/07/13 0552  GLUCAP 115*   No results found  for this basename: HGBA1C    Scheduled Meds: . acetaminophen  1,000 mg Intravenous Q6H  . albuterol  2.5 mg Nebulization Q6H  . cefOXitin  2 g Intravenous Q6H  . enoxaparin  30 mg Subcutaneous Q24H  . fentaNYL   Intravenous Q4H  . ketorolac  15 mg Intravenous Q6H  . metoprolol tartrate  12.5 mg Per J Tube BID  . pantoprazole (PROTONIX) IV  40 mg Intravenous Q12H  . sodium chloride  10 mL Intravenous Q12H    Continuous Infusions: . dextrose 5 % and 0.9% NaCl 125 mL/hr at 07/07/13 1191    Past Medical History  Diagnosis Date  . Hyperlipemia   . Colon polyp   . Pre-diabetes   . GERD  (gastroesophageal reflux disease)   . Diabetes mellitus without complication   . Esophageal cancer 03/18/2013    Adenocarcinoma    Past Surgical History  Procedure Laterality Date  . Appendectomy    . Lipoma excision    . Edg  03/18/2013    Esophageal Mass    Jarold Motto MS, RD, LDN Pager: [phone] After-hours pager: [phone]

## 2013-07-08 NOTE — Progress Notes (Signed)
KCL protocol started, K 2.9, Cr 0.5  L Tamaka Sawin RN

## 2013-07-09 ENCOUNTER — Inpatient Hospital Stay (HOSPITAL_COMMUNITY): Payer: BC Managed Care – PPO

## 2013-07-09 LAB — CBC
HCT: 33.5 % — ABNORMAL LOW (ref 39.0–52.0)
Hemoglobin: 11.4 g/dL — ABNORMAL LOW (ref 13.0–17.0)
MCH: 30.6 pg (ref 26.0–34.0)
MCHC: 34 g/dL (ref 30.0–36.0)
MCV: 90.1 fL (ref 78.0–100.0)
Platelets: 127 10*3/uL — ABNORMAL LOW (ref 150–400)
RBC: 3.72 MIL/uL — ABNORMAL LOW (ref 4.22–5.81)
RDW: 14.7 % (ref 11.5–15.5)
WBC: 10.2 10*3/uL (ref 4.0–10.5)

## 2013-07-09 LAB — BASIC METABOLIC PANEL
BUN: 7 mg/dL (ref 6–23)
CO2: 26 mEq/L (ref 19–32)
Calcium: 8.2 mg/dL — ABNORMAL LOW (ref 8.4–10.5)
Chloride: 105 mEq/L (ref 96–112)
Creatinine, Ser: 0.73 mg/dL (ref 0.50–1.35)
GFR calc Af Amer: >90 mL/min (ref >90)
GFR calc non Af Amer: >90 mL/min (ref >90)
Glucose, Bld: 116 mg/dL — ABNORMAL HIGH (ref 70–99)
Potassium: 3.8 mEq/L (ref 3.5–5.1)
Sodium: 137 mEq/L (ref 135–145)

## 2013-07-09 LAB — CALCIUM, IONIZED: Calcium, Ion: 1.19 mmol/L (ref 1.12–1.23)

## 2013-07-09 MED ORDER — VITAL AF 1.2 CAL PO LIQD
1000.0000 mL | ORAL | Status: DC
  Administered 2013-07-09: 1000 mL
  Filled 2013-07-09 (×3): qty 1000

## 2013-07-09 MED ORDER — KETOROLAC TROMETHAMINE 15 MG/ML IJ SOLN
15.0000 mg | Freq: Four times a day (QID) | INTRAMUSCULAR | Status: AC
  Administered 2013-07-09 – 2013-07-10 (×3): 15 mg via INTRAVENOUS
  Filled 2013-07-09 (×3): qty 1

## 2013-07-09 MED ORDER — VITAL AF 1.2 CAL PO LIQD
Status: DC
  Filled 2013-07-09 (×2): qty 474

## 2013-07-09 NOTE — Op Note (Signed)
Joel Osborne, Joel Osborne NO.:  [id number]  MEDICAL RECORD NO.:  [id number]  LOCATION:  2310                         FACILITY:  [facility]  PHYSICIAN:  Sheliah Plane, MD    DATE OF BIRTH:  01-03-1966  DATE OF PROCEDURE:  07/07/2013 DATE OF DISCHARGE:                              OPERATIVE REPORT   PREOPERATIVE DIAGNOSIS:  Adenocarcinoma of the distal esophagus.  POSTOPERATIVE DIAGNOSIS:  Adenocarcinoma of the distal esophagus.  SURGICAL PROCEDURE:  Transhiatal total esophagectomy with cervical esophagogastrostomy, pyloroplasty, feeding jejunostomy, bronchoscopy.  SURGEON:  Sheliah Plane, MD  FIRST ASSISTANT:  Rowe Clack, PA-C  BRIEF HISTORY:  The patient is a 47 year old male who presented in spring of 2014, with difficulty swallowing and evaluation confirmed adenocarcinoma of the distal esophagus and cardia of the stomach, with probable paraesophageal node involvement.  There was no obvious distant metastasis.  The patient underwent course of treatment with chemotherapy and radiation therapy with improvement of his swallowing symptoms and good maintenance of his nutritional status, question of liver lesion and the right lobe of the liver was confirmed to be benign by MRI scan.  The patient was referred for esophagectomy.  Risks and options were discussed with the patient in detail, and he was willing to proceed.  DESCRIPTION OF PROCEDURE:  With central line and arterial line in place, the patient underwent general endotracheal anesthesia with a NIM's tube without difficulty.  Through the endotracheal tube, and after time-out was performed, a fiberoptic bronchoscopy was carried out to the subsegmental level without evidence of endobronchial lesions.  The tube was pulled back slightly and careful examination along the membranous trachea revealed no evidence of tumor invasion.  The scope was removed. Skin of the chest and legs, and the abdomen was prepped with  Betadine in the usual manner.  A upper abdominal midline incision was made and the abdominal cavity entered.  There was no obvious peritoneal mets noted. There was an area of nodularity of the left lobe of the liver, along the radiation field.  Scans had reported possible gallstones.  The patient's gallbladder appeared normal without scarring and no stones were palpated.  It was left in place.  We first proceeded with taking down the short gastric vessels with the device.  The esophageal hiatus was then opened and a Penrose drain placed around the distal esophagus.  The beginning of a transhiatal esophagectomy was started dissecting out the lower esophagus from the abdomen.  The lower esophagus was firm to palpation, though there was no obvious extension of tumor into surrounding structures.  With this much of the esophagus freed from the abdomen as possible, the lesser sac was entered.  The left gastric artery was identified.  A small bulldog clamp was placed on the artery. With this clamp, the stomach appeared viable and there was a easily palpable pulse along the gastroepiploic.  We then proceeded to the left neck, where incision was made along the sternocleidomastoid muscle and dissection was carried down retracting the carotid artery laterally and down to the prevertebral fascia.  The NIM's device was used along through this dissection and the recurrent nerve was located and special care was taken to avoid retraction  on it with placement of metal retractors on it.  The cervical esophagus was encircled with a Penrose and dissection was carried out both superiorly and inferiorly through the abdomen until the esophagus was freed.  The cervical esophagus was then divided after pulling the NG tube back and the specimen delivered to the abdomen.  A 5-6 cm margin along the cardia of the stomach was left and the gastric tube was created with serial firings of a GIA stapler.  The staple line  was oversewn with running 3-0 Prolene.  The left gastric artery and vein were divided and remained in dissection of the specimen was completed.  With the gastric tube complete and appearing viable and with a easily palpable pulse in the right gastroepiploic, a site on the anterior surface of the stomach away from the tip of the fundus was marked as the anastomosis site.  A formal pyloroplasty was performed dividing the pylorus and then reclosing it with interrupted 3-0 Vicryls and a interrupted 4-0 silk sutures.  A long Foley catheter with a large balloon was passed through the posterior mediastinum from the neck.  The stomach was placed taking care not to torque.  Stomach was placed in the camera bag, secured and delivered to the cervical incision without tension and with good length.  The end of the cervical esophagus was divided.  The esophagus was tacked to the fundus of the stomach.  A small opening was made in the fundus.  The mucosal layers of the stomach and esophagus were approximated with interrupted Vicryl sutures.  A purple stapler was used to create the posterior portion of the anastomosis.  The anterior wall was then closed with interrupted 4-0 Vicryl sutures and a interrupted 3-0 silk sutures. Prior to complete closure, the NG tube was repositioned into the gastric tube in the upper abdomen.  With the anastomosis completed, the anastomosis was allowed to move into its anatomic position.  A Penrose drain was placed in the depths of the wound and brought out through a separate stab wound in the left neck.  The esophageal hiatus was then closed with interrupted silk sutures.  The proximal jejunum was identified and a red rubber catheter with the tip cut off, and extra holes added was into the small bowel and brought out through the left abdominal wall.  With the sponge and needle count reported as correct and estimated blood loss at approximately 200 mL, the abdomen was  closed with #1 running Prolene suture, a running 2-0 Vicryl, and skin staples. The neck incision was closed with interrupted 3-0 Vicryl in a 3-0 subcuticular stitch.  Dry dressings were applied.  The patient was left intubated and transferred to the Surgical Intensive Care Unit for further postoperative care on her rapid vent weaning protocol.  As noted, sponge and needle count was reported as correct.  The patient tolerated the procedure without obvious complication.  No blood transfusions were required.  Estimated blood loss approximately 200-250 mL.     Sheliah Plane, MD     EG/MEDQ  D:  07/08/2013  T:  07/09/2013  Job:  [id number]

## 2013-07-09 NOTE — Progress Notes (Addendum)
TCTS DAILY ICU PROGRESS NOTE                   [address]            [place] [address]          [phone]   2 Days Post-Op Procedure(s) (LRB): ESOPHAGECTOMY COMPLETE (N/A) VIDEO BRONCHOSCOPY (N/A)  Total Length of Stay:  LOS: 2 days   Subjective: Pain 4/10 , much better with toradol.  Objective: Vital signs in last 24 hours: Temp:  [98.5 F (36.9 C)-100 F (37.8 C)] 100 F (37.8 C) (07/30 0729) Pulse Rate:  [80-134] 102 (07/30 0600) Cardiac Rhythm:  [-] Sinus tachycardia (07/30 0400) Resp:  [14-25] 19 (07/30 0600) BP: (98-141)/(55-91) 126/69 mmHg (07/30 0600) SpO2:  [91 %-100 %] 91 % (07/30 0600) Arterial Line BP: (94-139)/(70-82) 139/70 mmHg (07/29 0900) Weight:  [229 lb 15 oz (104.3 kg)] 229 lb 15 oz (104.3 kg) (07/30 0600)  Filed Weights   07/07/13 0700 07/08/13 0700 07/09/13 0600  Weight: 222 lb (100.699 kg) 229 lb 4.5 oz (104 kg) 229 lb 15 oz (104.3 kg)    Weight change: 10.6 oz (0.3 kg)   Hemodynamic parameters for last 24 hours:    Intake/Output from previous day: 07/29 0701 - 07/30 0700 In: 2999 [I.V.:2819; NG/GT:70; IV Piggyback:50] Out: 2155 [Urine:1475; Emesis/NG output:430; Chest Tube:250]  Intake/Output this shift:    Current Meds: Scheduled Meds: . albuterol  2.5 mg Nebulization Q6H  . enoxaparin  30 mg Subcutaneous Q24H  . fentaNYL   Intravenous Q4H  . metoprolol tartrate  12.5 mg Per Tube BID  . pantoprazole (PROTONIX) IV  40 mg Intravenous Q12H  . sodium chloride  10 mL Intravenous Q12H   Continuous Infusions: . dextrose 5 % and 0.9 % NaCl with KCl 20 mEq/L 125 mL/hr (07/08/13 1845)   PRN Meds:.diphenhydrAMINE, diphenhydrAMINE, fentaNYL, naloxone, ondansetron (ZOFRAN) IV, potassium chloride, sodium chloride  General appearance: alert, cooperative and no distress Heart: regular rate and rhythm and tachy Lungs: dim in bases Abdomen: soft, no BS, + incis TTP Extremities: minor edema Wound: some drainage from neck  incision  Lab Results: CBC: Recent Labs  07/08/13 0401 07/09/13 0415  WBC 12.1* 10.2  HGB 13.2 11.4*  HCT 37.6* 33.5*  PLT 138* 127*   BMET:  Recent Labs  07/08/13 0401 07/09/13 0415  NA 138 137  K 2.9* 3.8  CL 112 105  CO2 20 26  GLUCOSE 122* 116*  BUN 7 7  CREATININE 0.50 0.73  CALCIUM 6.3* 8.2*    PT/INR: No results found for this basename: LABPROT, INR,  in the last 72 hours Radiology: Dg Chest Port 1 View  07/09/2013   *RADIOLOGY REPORT*  Clinical Data: Status post esophagectomy.  Evaluate chest tube placement.  PORTABLE CHEST - 1 VIEW  Comparison: Chest x-ray 07/08/2013.  Findings: Previously noted left basilar chest tube is similarly positioned along the inferior aspect of the left hemithorax.  Trace left apical pneumothorax is identified (less than 5% of the volume of the left hemithorax).  Bibasilar opacities (left greater than right), favored to represent some mild postoperative subsegmental atelectasis.  Likewise, a linear opacity in the right upper lobe is most compatible with subsegmental atelectasis.  Blunting of the left costophrenic sulcus suggests a small amount of left-sided pleural fluid.  No evidence of pulmonary edema.  Heart size is within normal limits.  Mediastinal contours are slightly distorted by patient positioning and postoperative changes of esophagectomy  and gastric pull-through.  IMPRESSION: 1.  Postoperative changes and support apparatus, as above. 2.  Left sided hydropneumothorax with small pleural fluid component and trace left apical pneumothorax component. 3.  Bibasilar (left greater than right) postoperative atelectasis and subsegmental atelectasis in the right upper lobe.   Original Report Authenticated By: Trudie Reed, M.D.   Dg Chest Port 1 View  07/08/2013   *RADIOLOGY REPORT*  Clinical Data: Postop esophagectomy and gastric pull-through for esophageal cancer.  Extubation.  Follow up atelectasis in the right upper lobe.  PORTABLE CHEST - 1  VIEW  Comparison: Portable chest x-ray yesterday and two-view chest x-ray yesterday.  Findings: Interval extubation. Left chest tube in place with no pneumothorax.  Right jugular central venous catheter tip projects over the mid SVC.  Nasogastric tube in the stomach.  Marked improvement in aeration in the right upper lobe, with mild atelectasis persisting. No change in the consolidation in the left lower lobe.  No new pulmonary parenchymal abnormalities.  IMPRESSION: Support apparatus satisfactory.  No pneumothorax.  Marked improvement in aeration in the right upper lobe since yesterday, with mild atelectasis persisting.  Stable left lower lobe atelectasis and/or pneumonia.  No new abnormalities.   Original Report Authenticated By: Hulan Saas, M.D.   Dg Chest Portable 1 View  07/07/2013   *RADIOLOGY REPORT*  Clinical Data: Postoperative film  PORTABLE CHEST - 1 VIEW  Comparison: 07/07/2013  Findings: In endotracheal tube tip is about 2.5 cm above the carina.  There is a right internal jugular central line with tip projecting over the cavoatrial junction.  An NG tube crosses the gastroesophageal junction.  There is a right upper lobe collapse. Right middle and lower lobe are clear.  On the left, there is a left chest tube with no pneumothorax.  There is mild left lower lobe consolidation medially.  IMPRESSION:  1.  Right upper lobe that significant atelectasis/collapse. 2.  Left lower lobe consolidation.  This may represent atelectasis versus pneumonitis.   Original Report Authenticated By: Esperanza Heir, M.D.     Assessment/Plan: S/P Procedure(s) (LRB): ESOPHAGECTOMY COMPLETE (N/A) VIDEO BRONCHOSCOPY (N/A)  1 doing well 2 D/c foley and chest tube 3 start 1/2 dose elemental TF's 4 cont toradol one more day 5 decrease ivf to 75 cc hr 6 sugars ok 7 pulm toilet for atx, no leukocytosis 8 labs ok, calcium (ionized ) and serum is normal    Joel Osborne 07/09/2013 7:31 AM  Wbc 10,000, minimal  drainage from neck drain D/c ct today Ambulate Start tube feedings I have seen and examined Joel Osborne and agree with the above assessment  and plan.  Delight Ovens MD Beeper [phone] Office [phone] 07/09/2013 7:55 AM

## 2013-07-09 NOTE — Progress Notes (Addendum)
NUTRITION FOLLOW UP  DOCUMENTATION CODES  Per approved criteria   -Obesity Unspecified    Intervention:   Once ready to initiate feedings, recommend initiation of Vital AF 1.2 via j-tube at 15 ml/h, advance by 10 ml q 8 hours to goal of 80 ml/h. Goal rate will provide: 2304 kcal, 144 grams protein, 1557 ml free water. RD to continue to follow nutrition care plan.  **RD spoke with PharmD that ordered original tube feeding order. Discussed that "half-strength" enteral nutrition poses infection risk. PharmD has changed order to Vital AF 1.2 at 15 ml/hr, which will provide the same caloric content as original order. Recommendations for further TF advancement are as above.**  Nutrition Dx:   Inadequate oral intake related to inability to eat as evidenced by NPO status. Ongoing.  Goal:   Initiate nutrition support; intake to meet at least 90% of estimated needs.  Monitor:   Initiation of nutrition support, weights, labs, I/O's  Assessment:   Underwent intentional wt loss program at Thanksgiving of last year; has lost >30 lb. Developed 3-4 month history of solid food dysphagia. Work-up revealed mass in the esophagus and stomach; bx confirmed adenocarcinoma. Completed chemo and radiation therapy at end of May. Has been able to maintain his nutritional status. Admitted for surgical resection.  Underwent transhiatal esophagectomy and placement of feeding j-tube on 7/28. MD has ordered half strength tube feedings to start at 1400 of Vital AF 1.2 at 30 ml with equal parts TF and water. RD paged Gershon Crane to discuss a more appropriate tube feeding regimen.     Height: Ht Readings from Last 1 Encounters:  07/07/13 6' (1.829 m)    Weight Status:   Wt Readings from Last 1 Encounters:  07/09/13 229 lb 15 oz (104.3 kg)  Stable.  Re-estimated needs:  Kcal: 2150 - 2400 Protein: 130 - 150 g Fluid: 2.1 - 2.4 liters daily  Skin:  Abdomen incision Neck incision  Diet Order:  NPO   Intake/Output Summary (Last 24 hours) at 07/09/13 1248 Last data filed at 07/09/13 1200  Gross per 24 hour  Intake 2814.17 ml  Output   2110 ml  Net 704.17 ml    Last BM: PTA   Labs:   Recent Labs Lab 07/07/13 1500 07/08/13 0401 07/09/13 0415  NA 137 138 137  K 4.7 2.9* 3.8  CL 104 112 105  CO2 23 20 26   BUN 12 7 7   CREATININE 0.81 0.50 0.73  CALCIUM 8.2* 6.3* 8.2*  GLUCOSE 153* 122* 116*    CBG (last 3)   Recent Labs  07/07/13 0552  GLUCAP 115*    Scheduled Meds: . enoxaparin  30 mg Subcutaneous Q24H  . fentaNYL   Intravenous Q4H  . ketorolac  15 mg Intravenous Q6H  . metoprolol tartrate  12.5 mg Per Tube BID  . pantoprazole (PROTONIX) IV  40 mg Intravenous Q12H  . sodium chloride  10 mL Intravenous Q12H  . feeding supplement half strength   Tube Q24H    Continuous Infusions: . dextrose 5 % and 0.9 % NaCl with KCl 20 mEq/L 75 mL/hr at 07/09/13 0747    Joel Motto MS, RD, LDN Pager: [phone] After-hours pager: [phone]

## 2013-07-09 NOTE — Care Management Note (Signed)
    Page 1 of 2   07/18/2013     4:02:14 PM   CARE MANAGEMENT NOTE 07/18/2013  Patient:  Joel Osborne, Joel Osborne   Account Number:  [id number]  Date Initiated:  07/08/2013  Documentation initiated by:  [facility]  Subjective/Objective Assessment:   post op transhiatel esophagectomy, feeding jejunostomy     Action/Plan:   Anticipated DC Date:  07/15/2013   Anticipated DC Plan:  HOME W HOME HEALTH SERVICES      DC Planning Services  CM consult      Choice offered to / List presented to:             Status of service:  Completed, signed off Medicare Important Message given?   (If response is "NO", the following Medicare IM given date fields will be blank) Date Medicare IM given:   Date Additional Medicare IM given:    Discharge Disposition:  HOME/SELF CARE  Per UR Regulation:  Reviewed for med. necessity/level of care/duration of stay  If discussed at Long Length of Stay Meetings, dates discussed:   07/15/2013  07/17/2013    Comments:  Contact:  Zender,Voula Spouse [phone]  07/18/13 Xzaria Teo,RN,BSN [phone] PT FOR DC HOME TODAY.  HE IS ON SOFT DIET, AND DOES NOT NEED TUBE FEEDINGS.  HE WILL DC HOME WITH J-TUBE, BUT HAS BEEN TAUGHT TO DO J-TUBE FLUSHES BY BEDSIDE RN.  DECLINES OFFER OF HHRN SET UP.  07/15/13 Kylin Genna,RN,BSN [phone] PT NOW ON CL DIET; TUBE FEEDINGS CONT AT 60/HR.  CONT TO FOLLOW PROGRESS.  07-09-13 2:50pm Avie Arenas, RNBSN [phone] Sitting up in chair.  States lives with wife and children who will be with him on discharge.  CM will continue to follow for discharge needs.  May have tubes at discharge and will need education and possible HH to follow on discharge.

## 2013-07-10 ENCOUNTER — Inpatient Hospital Stay (HOSPITAL_COMMUNITY): Payer: BC Managed Care – PPO

## 2013-07-10 LAB — BASIC METABOLIC PANEL
BUN: 8 mg/dL (ref 6–23)
CO2: 26 mEq/L (ref 19–32)
Calcium: 8.7 mg/dL (ref 8.4–10.5)
Chloride: 105 mEq/L (ref 96–112)
Creatinine, Ser: 0.66 mg/dL (ref 0.50–1.35)
GFR calc Af Amer: >90 mL/min (ref >90)
GFR calc non Af Amer: >90 mL/min (ref >90)
Glucose, Bld: 112 mg/dL — ABNORMAL HIGH (ref 70–99)
Potassium: 3.9 mEq/L (ref 3.5–5.1)
Sodium: 138 mEq/L (ref 135–145)

## 2013-07-10 LAB — CBC
HCT: 33.8 % — ABNORMAL LOW (ref 39.0–52.0)
Hemoglobin: 11.5 g/dL — ABNORMAL LOW (ref 13.0–17.0)
MCH: 30.5 pg (ref 26.0–34.0)
MCHC: 34 g/dL (ref 30.0–36.0)
MCV: 89.7 fL (ref 78.0–100.0)
Platelets: 136 10*3/uL — ABNORMAL LOW (ref 150–400)
RBC: 3.77 MIL/uL — ABNORMAL LOW (ref 4.22–5.81)
RDW: 14.1 % (ref 11.5–15.5)
WBC: 8 10*3/uL (ref 4.0–10.5)

## 2013-07-10 MED ORDER — VITAL AF 1.2 CAL PO LIQD
1000.0000 mL | ORAL | Status: DC
  Filled 2013-07-10: qty 1000

## 2013-07-10 MED ORDER — VITAL AF 1.2 CAL PO LIQD
1000.0000 mL | ORAL | Status: DC
  Administered 2013-07-11: 1000 mL
  Filled 2013-07-10 (×2): qty 1000

## 2013-07-10 MED ORDER — ENOXAPARIN SODIUM 30 MG/0.3ML ~~LOC~~ SOLN
40.0000 mg | Freq: Every day | SUBCUTANEOUS | Status: DC
  Administered 2013-07-10 – 2013-07-17 (×7): 40 mg via SUBCUTANEOUS
  Filled 2013-07-10 (×9): qty 0.4

## 2013-07-10 NOTE — Progress Notes (Signed)
Patient ID: Joel Osborne, male   DOB: 13-Oct-1966, 47 y.o.   MRN: [id number]  SICU Evening Rounds:  Hemodynamically stable  Sleeping  No problems tonight

## 2013-07-10 NOTE — Progress Notes (Addendum)
Patient ID: Joel Osborne, male   DOB: 11-12-1966, 47 y.o.   MRN: [id number] [facility] DAILY ICU PROGRESS NOTE                   [address]            [place] [id number]          [phone]   3 Days Post-Op Procedure(s) (LRB): ESOPHAGECTOMY COMPLETE (N/A) VIDEO BRONCHOSCOPY (N/A)  Total Length of Stay:  LOS: 3 days   Subjective: Up to chair , walked in unit, pain control better  Objective: Vital signs in last 24 hours: Temp:  [97.7 F (36.5 C)-100 F (37.8 C)] 97.7 F (36.5 C) (07/31 0402) Pulse Rate:  [70-115] 79 (07/31 0600) Cardiac Rhythm:  [-] Sinus tachycardia (07/30 2000) Resp:  [11-24] 11 (07/31 0600) BP: (108-148)/(62-96) 126/86 mmHg (07/31 0600) SpO2:  [94 %-98 %] 95 % (07/31 0600) FiO2 (%):  [0 %] 0 % (07/30 1650) Weight:  [227 lb 8.2 oz (103.2 kg)] 227 lb 8.2 oz (103.2 kg) (07/31 0600)  Filed Weights   07/08/13 0700 07/09/13 0600 07/10/13 0600  Weight: 229 lb 4.5 oz (104 kg) 229 lb 15 oz (104.3 kg) 227 lb 8.2 oz (103.2 kg)    Weight change: -2 lb 6.8 oz (-1.1 kg)   Hemodynamic parameters for last 24 hours:    Intake/Output from previous day: 07/30 0701 - 07/31 0700 In: 1979.3 [I.V.:1871.1; NG/GT:108.3] Out: 2560 [Urine:1710; Emesis/NG output:800; Chest Tube:50]  Intake/Output this shift: Total I/O In: 894.5 [I.V.:864.5; NG/GT:30] Out: 1750 [Urine:1250; Emesis/NG output:500]  Current Meds: Scheduled Meds: . enoxaparin  30 mg Subcutaneous Q24H  . feeding supplement (VITAL AF 1.2 CAL)  1,000 mL Per Tube Q24H  . fentaNYL   Intravenous Q4H  . metoprolol tartrate  12.5 mg Per Tube BID  . pantoprazole (PROTONIX) IV  40 mg Intravenous Q12H  . sodium chloride  10 mL Intravenous Q12H   Continuous Infusions: . dextrose 5 % and 0.9 % NaCl with KCl 20 mEq/L 75 mL/hr at 07/10/13 0600   PRN Meds:.diphenhydrAMINE, diphenhydrAMINE, fentaNYL, naloxone, ondansetron (ZOFRAN) IV, potassium chloride, sodium chloride  General appearance: alert  and cooperative Neurologic: intact Heart: regular rate and rhythm, S1, S2 normal, no murmur, click, rub or gallop Lungs: clear to auscultation bilaterally Abdomen: faint bowel sounds, not distended Extremities: extremities normal, atraumatic, no cyanosis or edema and Homans sign is negative, no sign of DVT Wound: very minimal drainage from neck drain, wounds intact  Lab Results: CBC: Recent Labs  07/09/13 0415 07/10/13 0444  WBC 10.2 8.0  HGB 11.4* 11.5*  HCT 33.5* 33.8*  PLT 127* 136*   BMET:  Recent Labs  07/09/13 0415 07/10/13 0444  NA 137 138  K 3.8 3.9  CL 105 105  CO2 26 26  GLUCOSE 116* 112*  BUN 7 8  CREATININE 0.73 0.66  CALCIUM 8.2* 8.7    PT/INR: No results found for this basename: LABPROT, INR,  in the last 72 hours Radiology: Dg Chest Port 1 View  07/09/2013   *RADIOLOGY REPORT*  Clinical Data: Status post esophagectomy.  Evaluate chest tube placement.  PORTABLE CHEST - 1 VIEW  Comparison: Chest x-ray 07/08/2013.  Findings: Previously noted left basilar chest tube is similarly positioned along the inferior aspect of the left hemithorax.  Trace left apical pneumothorax is identified (less than 5% of the volume of the left hemithorax).  Bibasilar opacities (left greater than right), favored to represent some  mild postoperative subsegmental atelectasis.  Likewise, a linear opacity in the right upper lobe is most compatible with subsegmental atelectasis.  Blunting of the left costophrenic sulcus suggests a small amount of left-sided pleural fluid.  No evidence of pulmonary edema.  Heart size is within normal limits.  Mediastinal contours are slightly distorted by patient positioning and postoperative changes of esophagectomy and gastric pull-through.  IMPRESSION: 1.  Postoperative changes and support apparatus, as above. 2.  Left sided hydropneumothorax with small pleural fluid component and trace left apical pneumothorax component. 3.  Bibasilar (left greater than right)  postoperative atelectasis and subsegmental atelectasis in the right upper lobe.   Original Report Authenticated By: Trudie Reed, M.D.     Assessment/Plan: S/P Procedure(s) (LRB): ESOPHAGECTOMY COMPLETE (N/A) VIDEO BRONCHOSCOPY (N/A) Mobilize Diabetes control continue ambulation Increase tube feedings slowly,  Path noted: ypT1b,ypN0,cM0 Stage IA after treatment    Aurea Aronov B 07/10/2013 6:45 AM

## 2013-07-10 NOTE — Progress Notes (Signed)
Patient examined and record reviewed.Hemodynamics stable,labs satisfactory.Patient had stable day.Continue current care.  Ambulated well, TF up to 40/hr VAN TRIGT III,PETER 07/10/2013

## 2013-07-10 NOTE — Progress Notes (Signed)
NUTRITION FOLLOW UP  DOCUMENTATION CODES  Per approved criteria   -Obesity Unspecified    Intervention:   Recommend continued advancement of of Vital AF 1.2 via j-tube by 10 ml q 8 hours, or per team's discretion, to goal of 80 ml/h. Goal rate will provide: 2304 kcal, 144 grams protein, 1557 ml free water. RD to continue to follow nutrition care plan.  Nutrition Dx:   Inadequate oral intake related to inability to eat as evidenced by NPO status. Ongoing.  Goal:   Initiate nutrition support; intake to meet at least 90% of estimated needs. Unmet.  Monitor:   Tolerance/adequacy of nutrition support, ability to transition to PO's, weights, labs, I/O's  Assessment:   Underwent intentional wt loss program at Thanksgiving of last year; has lost >30 lb. Developed 3-4 month history of solid food dysphagia. Work-up revealed mass in the esophagus and stomach; bx confirmed adenocarcinoma. Completed chemo and radiation therapy at end of May. Has been able to maintain his nutritional status. Admitted for surgical resection.  Underwent transhiatal esophagectomy and placement of feeding j-tube on 7/28.   Currently receiving Vital AF 1.2 at 30 ml/hr. This provides: 864 kcal, 54 g protein, 584 ml free water. Per RN and patient, currently tolerating regimen well.  Height: Ht Readings from Last 1 Encounters:  07/07/13 6' (1.829 m)    Weight Status:   Wt Readings from Last 1 Encounters:  07/10/13 227 lb 8.2 oz (103.2 kg)  Stable.  Re-estimated needs:  Kcal: 2150 - 2400 Protein: 130 - 150 g Fluid: 2.1 - 2.4 liters daily  Skin:  Abdomen incision Neck incision  Diet Order: NPO   Intake/Output Summary (Last 24 hours) at 07/10/13 0924 Last data filed at 07/10/13 0800  Gross per 24 hour  Intake 2076.25 ml  Output   2490 ml  Net -413.75 ml    Last BM: PTA   Labs:   Recent Labs Lab 07/08/13 0401 07/09/13 0415 07/10/13 0444  NA 138 137 138  K 2.9* 3.8 3.9  CL 112 105 105  CO2  20 26 26   BUN 7 7 8   CREATININE 0.50 0.73 0.66  CALCIUM 6.3* 8.2* 8.7  GLUCOSE 122* 116* 112*    CBG (last 3)  No results found for this basename: GLUCAP,  in the last 72 hours  Scheduled Meds: . enoxaparin  40 mg Subcutaneous Daily  . feeding supplement (VITAL AF 1.2 CAL)  1,000 mL Per Tube Q24H  . fentaNYL   Intravenous Q4H  . metoprolol tartrate  12.5 mg Per Tube BID  . pantoprazole (PROTONIX) IV  40 mg Intravenous Q12H  . sodium chloride  10 mL Intravenous Q12H    Continuous Infusions: . dextrose 5 % and 0.9 % NaCl with KCl 20 mEq/L 50 mL/hr at 07/10/13 0835    Jarold Motto MS, RD, LDN Pager: [phone] After-hours pager: [phone]

## 2013-07-11 ENCOUNTER — Encounter (HOSPITAL_COMMUNITY): Payer: Self-pay | Admitting: *Deleted

## 2013-07-11 ENCOUNTER — Inpatient Hospital Stay (HOSPITAL_COMMUNITY): Payer: BC Managed Care – PPO

## 2013-07-11 LAB — BASIC METABOLIC PANEL
BUN: 9 mg/dL (ref 6–23)
CO2: 27 mEq/L (ref 19–32)
Calcium: 8.9 mg/dL (ref 8.4–10.5)
Chloride: 105 mEq/L (ref 96–112)
Creatinine, Ser: 0.6 mg/dL (ref 0.50–1.35)
GFR calc Af Amer: >90 mL/min (ref >90)
GFR calc non Af Amer: >90 mL/min (ref >90)
Glucose, Bld: 129 mg/dL — ABNORMAL HIGH (ref 70–99)
Potassium: 3.5 mEq/L (ref 3.5–5.1)
Sodium: 140 mEq/L (ref 135–145)

## 2013-07-11 LAB — CBC
HCT: 34.2 % — ABNORMAL LOW (ref 39.0–52.0)
Hemoglobin: 11.9 g/dL — ABNORMAL LOW (ref 13.0–17.0)
MCH: 30.8 pg (ref 26.0–34.0)
MCHC: 34.8 g/dL (ref 30.0–36.0)
MCV: 88.6 fL (ref 78.0–100.0)
Platelets: 170 10*3/uL (ref 150–400)
RBC: 3.86 MIL/uL — ABNORMAL LOW (ref 4.22–5.81)
RDW: 13.7 % (ref 11.5–15.5)
WBC: 6.8 10*3/uL (ref 4.0–10.5)

## 2013-07-11 MED ORDER — POTASSIUM CHLORIDE IN NACL 40-0.9 MEQ/L-% IV SOLN
INTRAVENOUS | Status: DC
  Administered 2013-07-11: 09:00:00 via INTRAVENOUS
  Administered 2013-07-12 – 2013-07-14 (×2): 50 mL/h via INTRAVENOUS
  Administered 2013-07-15 – 2013-07-17 (×2): via INTRAVENOUS
  Filled 2013-07-11 (×10): qty 1000

## 2013-07-11 MED ORDER — VITAL AF 1.2 CAL PO LIQD
1000.0000 mL | ORAL | Status: DC
  Administered 2013-07-12 – 2013-07-14 (×3): 1000 mL
  Filled 2013-07-11 (×7): qty 1000

## 2013-07-11 NOTE — Progress Notes (Signed)
Patient ID: Joel Osborne, male   DOB: [birth year], 47 y.o.   MRN: [id number]  SICU Evening Rounds:  Afebrile, Hemodynamically stable  Passing some flatus.  Urine output ok

## 2013-07-11 NOTE — Progress Notes (Addendum)
TCTS DAILY ICU PROGRESS NOTE                   [address]            [place] [id number]          [phone]   4 Days Post-Op Procedure(s) (LRB): ESOPHAGECTOMY COMPLETE (N/A) VIDEO BRONCHOSCOPY (N/A)  Total Length of Stay:  LOS: 4 days   Subjective: Some discomfort, but overall conts to improve, no nausea  Objective: Vital signs in last 24 hours: Temp:  [98.5 F (36.9 C)-99.2 F (37.3 C)] 98.6 F (37 C) (08/01 0400) Pulse Rate:  [77-111] 102 (08/01 0700) Cardiac Rhythm:  [-] Sinus tachycardia (07/31 2000) Resp:  [13-23] 17 (08/01 0700) BP: (107-134)/(56-89) 126/82 mmHg (08/01 0700) SpO2:  [93 %-99 %] 98 % (08/01 0700) FiO2 (%):  [0 %] 0 % (07/31 0837)  Filed Weights   07/08/13 0700 07/09/13 0600 07/10/13 0600  Weight: 229 lb 4.5 oz (104 kg) 229 lb 15 oz (104.3 kg) 227 lb 8.2 oz (103.2 kg)    Weight change:    Hemodynamic parameters for last 24 hours:    Intake/Output from previous day: 07/31 0701 - 08/01 0700 In: 2348.7 [I.V.:1332.4; NG/GT:926.3] Out: 2500 [Urine:1950; Emesis/NG output:550]  Intake/Output this shift:    Current Meds: Scheduled Meds: . enoxaparin  40 mg Subcutaneous Daily  . feeding supplement (VITAL AF 1.2 CAL)  1,000 mL Per Tube Q24H  . fentaNYL   Intravenous Q4H  . metoprolol tartrate  12.5 mg Per Tube BID  . pantoprazole (PROTONIX) IV  40 mg Intravenous Q12H  . sodium chloride  10 mL Intravenous Q12H   Continuous Infusions: . dextrose 5 % and 0.9 % NaCl with KCl 20 mEq/L 50 mL/hr at 07/11/13 0700   PRN Meds:.diphenhydrAMINE, diphenhydrAMINE, fentaNYL, naloxone, ondansetron (ZOFRAN) IV, potassium chloride, sodium chloride  General appearance: alert, cooperative and no distress Heart: regular rate and rhythm and tachy Lungs: mildly dim in bases Abdomen: faint BS, + incis tend.  Extremities: no edema Wound: incis healing well  Lab Results: CBC: Recent Labs  07/10/13 0444 07/11/13 0500  WBC 8.0 6.8  HGB  11.5* 11.9*  HCT 33.8* 34.2*  PLT 136* 170   BMET:  Recent Labs  07/10/13 0444 07/11/13 0500  NA 138 140  K 3.9 3.5  CL 105 105  CO2 26 27  GLUCOSE 112* 129*  BUN 8 9  CREATININE 0.66 0.60  CALCIUM 8.7 8.9    PT/INR: No results found for this basename: LABPROT, INR,  in the last 72 hours Radiology: Dg Chest Port 1 View  07/11/2013   *RADIOLOGY REPORT*  Clinical Data: Postop chest tube.  PORTABLE CHEST - 1 VIEW  Comparison: X-ray from yesterday.  Findings: Gastric suction tube appears similar to prior.  Right IJ central venous catheter in unchanged position.  Surgical drain in the left neck base remains.  Stable postoperative distortion of the mediastinal status post gastric pull-through.  Mild atelectatic changes at the bases persists.  No pneumothorax detected.  IMPRESSION: 1.  No recurrence of pneumothorax.  2.  Postoperative basilar atelectasis.   Original Report Authenticated By: Tiburcio Pea   Dg Chest Port 1 View  07/10/2013   *RADIOLOGY REPORT*  Clinical Data: Postop check.  Chest tube.  PORTABLE CHEST - 1 VIEW  Comparison: 07/09/2013.  Findings: Gastric suction tube in unchanged position, side port just below the level of the diaphragm.  Unchanged appearance of right IJ central  venous catheter.  Left-sided chest tube has been removed.  Surgical drain remains at the left thoracic inlet.  Postoperative widening of the mediastinum, likely related to the pull through.  Left base density likely also related to atelectasis and pleural fluid.  Lucency at the left base may be within the stomach, similar to yesterday.  Left apical pneumothorax no longer seen.  IMPRESSION: 1.  Left pneumothorax decreased or resolved.  2.  Stable postoperative appearance of the mediastinum. 3.  Decreasing right lung atelectasis.   Original Report Authenticated By: Tiburcio Pea     Assessment/Plan: S/P Procedure(s) (LRB): ESOPHAGECTOMY COMPLETE (N/A) VIDEO BRONCHOSCOPY (N/A)  1 tol 40 ml /hr TF, will  increase to 50 with ultimate goal rate 80 2 labs stable, will increase KCL in IVF 3 cont to push rehab / pulm toilet   GOLD,WAYNE E 07/11/2013 7:35 AM patient examined and medical record reviewed,agree with above note. Min penrose drainage from neck VAN TRIGT III,PETER 07/11/2013

## 2013-07-12 ENCOUNTER — Inpatient Hospital Stay (HOSPITAL_COMMUNITY): Payer: BC Managed Care – PPO

## 2013-07-12 LAB — BASIC METABOLIC PANEL
BUN: 11 mg/dL (ref 6–23)
CO2: 27 mEq/L (ref 19–32)
Calcium: 9 mg/dL (ref 8.4–10.5)
Chloride: 103 mEq/L (ref 96–112)
Creatinine, Ser: 0.65 mg/dL (ref 0.50–1.35)
GFR calc Af Amer: >90 mL/min (ref >90)
GFR calc non Af Amer: >90 mL/min (ref >90)
Glucose, Bld: 108 mg/dL — ABNORMAL HIGH (ref 70–99)
Potassium: 3.7 mEq/L (ref 3.5–5.1)
Sodium: 138 mEq/L (ref 135–145)

## 2013-07-12 LAB — CBC
HCT: 34.4 % — ABNORMAL LOW (ref 39.0–52.0)
Hemoglobin: 12.1 g/dL — ABNORMAL LOW (ref 13.0–17.0)
MCH: 31 pg (ref 26.0–34.0)
MCHC: 35.2 g/dL (ref 30.0–36.0)
MCV: 88.2 fL (ref 78.0–100.0)
Platelets: 171 10*3/uL (ref 150–400)
RBC: 3.9 MIL/uL — ABNORMAL LOW (ref 4.22–5.81)
RDW: 13.4 % (ref 11.5–15.5)
WBC: 6.6 10*3/uL (ref 4.0–10.5)

## 2013-07-12 MED ORDER — OXYCODONE-ACETAMINOPHEN 5-325 MG/5ML PO SOLN
10.0000 mL | ORAL | Status: DC | PRN
  Administered 2013-07-12: 5 mL via ORAL
  Administered 2013-07-12: 10 mL via ORAL
  Administered 2013-07-12: 5 mL via ORAL
  Administered 2013-07-13 – 2013-07-18 (×19): 10 mL via ORAL
  Filled 2013-07-12: qty 5
  Filled 2013-07-12 (×7): qty 10
  Filled 2013-07-12 (×2): qty 5
  Filled 2013-07-12 (×2): qty 10
  Filled 2013-07-12: qty 5
  Filled 2013-07-12 (×10): qty 10

## 2013-07-12 MED ORDER — FENTANYL CITRATE 0.05 MG/ML IJ SOLN
50.0000 ug | INTRAMUSCULAR | Status: DC | PRN
  Administered 2013-07-12: 50 ug via INTRAVENOUS
  Filled 2013-07-12: qty 2

## 2013-07-12 NOTE — Progress Notes (Signed)
Patient ID: Joel Osborne, male   DOB: 01-08-1966, 47 y.o.   MRN: [id number]  SICU Evening Rounds:  Hemodynamically stable  Had 2 BM's today  Pain under control

## 2013-07-12 NOTE — Progress Notes (Signed)
5 Days Post-Op Procedure(s) (LRB): ESOPHAGECTOMY COMPLETE (N/A) VIDEO BRONCHOSCOPY (N/A) Subjective: No complaints. Passing some flatus  Objective: Vital signs in last 24 hours: Temp:  [98.1 F (36.7 C)-98.8 F (37.1 C)] 98.4 F (36.9 C) (08/02 0803) Pulse Rate:  [68-111] 111 (08/02 0751) Cardiac Rhythm:  [-] Normal sinus rhythm (08/02 0500) Resp:  [15-24] 20 (08/02 0751) BP: (104-130)/(51-83) 120/80 mmHg (08/02 0751) SpO2:  [93 %-99 %] 98 % (08/02 0751) Weight:  [100.3 kg (221 lb 1.9 oz)] 100.3 kg (221 lb 1.9 oz) (08/02 0000)  Hemodynamic parameters for last 24 hours:    Intake/Output from previous day: 08/01 0701 - 08/02 0700 In: 2571.8 [I.V.:1152.7; NG/GT:1299.2] Out: 2500 [Urine:1550; Emesis/NG output:950] Intake/Output this shift: Total I/O In: 50 [NG/GT:50] Out: 150 [Emesis/NG output:150]  General appearance: alert and cooperative Neurologic: intact Heart: regular rate and rhythm, S1, S2 normal, no murmur, click, rub or gallop Lungs: diminished breath sounds bibasilar Abdomen: soft, non-tender; bowel sounds normal; no masses,  no organomegaly Extremities: edema minimal Wound: no drainage from penrose in neck  Lab Results:  Recent Labs  07/11/13 0500 07/12/13 0400  WBC 6.8 6.6  HGB 11.9* 12.1*  HCT 34.2* 34.4*  PLT 170 171   BMET:  Recent Labs  07/11/13 0500 07/12/13 0400  NA 140 138  K 3.5 3.7  CL 105 103  CO2 27 27  GLUCOSE 129* 108*  BUN 9 11  CREATININE 0.60 0.65  CALCIUM 8.9 9.0    PT/INR: No results found for this basename: LABPROT, INR,  in the last 72 hours ABG    Component Value Date/Time   PHART 7.364 07/08/2013 0402   HCO3 23.8 07/08/2013 0402   TCO2 25 07/08/2013 0402   ACIDBASEDEF 2.0 07/08/2013 0402   O2SAT 93.0 07/08/2013 0402   CBG (last 3)  No results found for this basename: GLUCAP,  in the last 72 hours  *RADIOLOGY REPORT*   Clinical Data: Cardiothoracic surgery.   CHEST - 2 VIEW   Comparison: Chest x-ray 12/2012.   Findings: There is a right-sided internal jugular central venous catheter with tip terminating in the mid superior vena cava. A nasogastric tube is seen extending into the stomach, however, the tip of the nasogastric tube extends below the lower margin of the image.  Lung volumes are low.  There are bibasilar opacities (left greater than right), compatible with mild postoperative subsegmental atelectasis.  Probable trace left pleural effusion. No evidence of pulmonary edema.  No pneumothorax.  Heart size is normal.  Mediastinal contours are unremarkable. Multiple midline staples are seen projecting over the upper abdomen.   IMPRESSION: 1.  Support apparatus, as above. 2.  Low lung volumes with bibasilar subsegmental atelectasis and small left pleural effusion.     Original Report Authenticated By: Trudie Reed, M.D.  Assessment/Plan: S/P Procedure(s) (LRB): ESOPHAGECTOMY COMPLETE (N/A) VIDEO BRONCHOSCOPY (N/A)  Doing well. He has good bowel sounds and is passing flatus with minimal NG output so will remove the tube. This will help his pulmonary toilet and comfort level. Plan esophagram on Monday.  Continue tube feeds.  Switch pain meds to per tube.   LOS: 5 days    Bayli Quesinberry K 07/12/2013

## 2013-07-13 LAB — GLUCOSE, CAPILLARY: Glucose-Capillary: 128 mg/dL — ABNORMAL HIGH (ref 70–99)

## 2013-07-13 NOTE — Progress Notes (Signed)
6 Days Post-Op Procedure(s) (LRB): ESOPHAGECTOMY COMPLETE (N/A) VIDEO BRONCHOSCOPY (N/A) Subjective: No complaints  Objective: Vital signs in last 24 hours: Temp:  [98.6 F (37 C)-99 F (37.2 C)] 98.6 F (37 C) (08/03 0000) Pulse Rate:  [65-104] 69 (08/03 0600) Cardiac Rhythm:  [-] Normal sinus rhythm (08/03 0500) Resp:  [12-22] 12 (08/03 0600) BP: (89-134)/(50-75) 106/64 mmHg (08/03 0600) SpO2:  [90 %-97 %] 96 % (08/03 0600)  Hemodynamic parameters for last 24 hours:    Intake/Output from previous day: 08/02 0701 - 08/03 0700 In: 1253 [I.V.:23; NG/GT:1230] Out: 1552 [Urine:1400; Emesis/NG output:150; Stool:2] Intake/Output this shift:    General appearance: alert and cooperative Heart: regular rate and rhythm, S1, S2 normal, no murmur, click, rub or gallop Lungs: clear to auscultation bilaterally Abdomen: soft, non-tender; bowel sounds normal; no masses,  no organomegaly Extremities: extremities normal, atraumatic, no cyanosis or edema No drainage from penrose Neck incision looks good  Lab Results:  Recent Labs  07/11/13 0500 07/12/13 0400  WBC 6.8 6.6  HGB 11.9* 12.1*  HCT 34.2* 34.4*  PLT 170 171   BMET:  Recent Labs  07/11/13 0500 07/12/13 0400  NA 140 138  K 3.5 3.7  CL 105 103  CO2 27 27  GLUCOSE 129* 108*  BUN 9 11  CREATININE 0.60 0.65  CALCIUM 8.9 9.0    PT/INR: No results found for this basename: LABPROT, INR,  in the last 72 hours ABG    Component Value Date/Time   PHART 7.364 07/08/2013 0402   HCO3 23.8 07/08/2013 0402   TCO2 25 07/08/2013 0402   ACIDBASEDEF 2.0 07/08/2013 0402   O2SAT 93.0 07/08/2013 0402   CBG (last 3)   Recent Labs  07/13/13 0026  GLUCAP 128*    Assessment/Plan: S/P Procedure(s) (LRB): ESOPHAGECTOMY COMPLETE (N/A) VIDEO BRONCHOSCOPY (N/A)  He is doing well following esophagectomy. Path shows T1b N0 lesion with negative resection margins. He is tolerating tube feeds well, bowels working. Plan is for  esophagram tomorrow.   LOS: 6 days    Carver Murakami K 07/13/2013

## 2013-07-14 ENCOUNTER — Inpatient Hospital Stay (HOSPITAL_COMMUNITY): Payer: BC Managed Care – PPO

## 2013-07-14 MED ORDER — IOHEXOL 300 MG/ML  SOLN
150.0000 mL | Freq: Once | INTRAMUSCULAR | Status: AC | PRN
  Administered 2013-07-14: 30 mL via ORAL

## 2013-07-14 MED ORDER — SODIUM CHLORIDE 0.9 % IJ SOLN
10.0000 mL | INTRAMUSCULAR | Status: DC | PRN
  Administered 2013-07-16: 20 mL

## 2013-07-14 MED ORDER — SODIUM CHLORIDE 0.9 % IJ SOLN
10.0000 mL | Freq: Two times a day (BID) | INTRAMUSCULAR | Status: DC
  Administered 2013-07-16: 10 mL

## 2013-07-14 NOTE — Progress Notes (Signed)
NUTRITION FOLLOW UP  DOCUMENTATION CODES  Per approved criteria   -Obesity Unspecified    Intervention:   Recommend diet advancement per team's discretion with addition of oral nutrition supplements as tolerated. Recommend nocturnal enteral feedings while diet is advanced to meet nutritional needs. Recommend Vital AF 1.2 at 80 ml/hr via j-tube x 12 hours (nocturnal.) This will provide: 1152 kcal, 72 grams protein, 779 ml free water. RD to continue to follow nutrition care plan.  Nutrition Dx:   Inadequate oral intake related to inability to eat as evidenced by NPO status. Ongoing.  Goal:   Initiate nutrition support; intake to meet at least 90% of estimated needs. Unmet.  Monitor:   Tolerance/adequacy of nutrition support, ability to transition to PO's, weights, labs, I/O's  Assessment:   Underwent intentional wt loss program at Thanksgiving of last year; has lost >30 lb. Developed 3-4 month history of solid food dysphagia. Work-up revealed mass in the esophagus and stomach; bx confirmed adenocarcinoma. Completed chemo and radiation therapy at end of May. Has been able to maintain his nutritional status. Admitted for surgical resection.  Underwent transhiatal esophagectomy and placement of feeding j-tube on 7/28.   RN reports that pt's tube feeding was just turned off s/p swallow/esophageal study. RN states that pt is to advance his diet. Remains NPO presently.  Was previously receiving Vital AF 1.2 at 50 ml/hr (never made it to goal of Vital 1.2 at 80 ml/hr.)  Height: Ht Readings from Last 1 Encounters:  07/07/13 6' (1.829 m)    Weight Status:   Wt Readings from Last 1 Encounters:  07/14/13 220 lb 14.4 oz (100.2 kg)  Stable (admit wt 222 lb)  Re-estimated needs:  Kcal: 2150 - 2400 Protein: 130 - 150 g Fluid: 2.1 - 2.4 liters daily  Skin:  Abdomen incision Neck incision  Diet Order: NPO   Intake/Output Summary (Last 24 hours) at 07/14/13 1019 Last data filed at  07/14/13 0800  Gross per 24 hour  Intake   1140 ml  Output   1750 ml  Net   -610 ml    Last BM: 8/2   Labs:   Recent Labs Lab 07/10/13 0444 07/11/13 0500 07/12/13 0400  NA 138 140 138  K 3.9 3.5 3.7  CL 105 105 103  CO2 26 27 27   BUN 8 9 11   CREATININE 0.66 0.60 0.65  CALCIUM 8.7 8.9 9.0  GLUCOSE 112* 129* 108*    CBG (last 3)   Recent Labs  07/13/13 0026  GLUCAP 128*    Scheduled Meds: . enoxaparin  40 mg Subcutaneous Daily  . feeding supplement (VITAL AF 1.2 CAL)  1,000 mL Per Tube Q24H  . metoprolol tartrate  12.5 mg Per Tube BID  . pantoprazole (PROTONIX) IV  40 mg Intravenous Q12H  . sodium chloride  10 mL Intravenous Q12H  . sodium chloride  10-40 mL Intracatheter Q12H    Continuous Infusions: . 0.9 % NaCl with KCl 40 mEq / L 50 mL/hr at 07/14/13 0800    Jarold Motto MS, [place], LDN Pager: [phone] After-hours pager: [phone]

## 2013-07-14 NOTE — Progress Notes (Signed)
Patient ID: Joel Osborne, male   DOB: [birth year], 47 y.o.   MRN: [id number] T[facility] DAILY ICU PROGRESS NOTE                   [address]            [place] [address]          [phone]   7 Days Post-Op Procedure(s) (LRB): ESOPHAGECTOMY COMPLETE (N/A) VIDEO BRONCHOSCOPY (N/A)  Total Length of Stay:  LOS: 7 days   Subjective: Feels well  Objective: Vital signs in last 24 hours: Temp:  [97.9 F (36.6 C)-98.9 F (37.2 C)] 98.1 F (36.7 C) (08/04 0744) Pulse Rate:  [64-96] 80 (08/04 0700) Cardiac Rhythm:  [-] Normal sinus rhythm (08/04 0700) Resp:  [11-22] 19 (08/04 0700) BP: (94-135)/(48-81) 111/66 mmHg (08/04 0700) SpO2:  [90 %-97 %] 92 % (08/04 0700) Weight:  [220 lb 14.4 oz (100.2 kg)] 220 lb 14.4 oz (100.2 kg) (08/04 0600)  Filed Weights   07/11/13 0600 07/12/13 0000 07/14/13 0600  Weight: 223 lb 15.8 oz (101.6 kg) 221 lb 1.9 oz (100.3 kg) 220 lb 14.4 oz (100.2 kg)    Weight change:    Hemodynamic parameters for last 24 hours:    Intake/Output from previous day: 08/03 0701 - 08/04 0700 In: 1220 [NG/GT:1220] Out: 1750 [Urine:1750]  Intake/Output this shift:    Current Meds: Scheduled Meds: . enoxaparin  40 mg Subcutaneous Daily  . feeding supplement (VITAL AF 1.2 CAL)  1,000 mL Per Tube Q24H  . metoprolol tartrate  12.5 mg Per Tube BID  . pantoprazole (PROTONIX) IV  40 mg Intravenous Q12H  . sodium chloride  10 mL Intravenous Q12H   Continuous Infusions: . 0.9 % NaCl with KCl 40 mEq / L 50 mL/hr at 07/14/13 0600   PRN Meds:.fentaNYL, oxyCODONE-acetaminophen, potassium chloride  General appearance: alert and cooperative Neurologic: intact Heart: regular rate and rhythm, S1, S2 normal, no murmur, click, rub or gallop Lungs: clear to auscultation bilaterally and normal percussion bilaterally Abdomen: soft, non-tender; bowel sounds normal; no masses,  no organomegaly Extremities: extremities normal, atraumatic, no cyanosis or edema  and Homans sign is negative, no sign of DVT Wound: intact, no drainage from neck  Lab Results: CBC: Recent Labs  07/12/13 0400  WBC 6.6  HGB 12.1*  HCT 34.4*  PLT 171   BMET:  Recent Labs  07/12/13 0400  NA 138  K 3.7  CL 103  CO2 27  GLUCOSE 108*  BUN 11  CREATININE 0.65  CALCIUM 9.0    PT/INR: No results found for this basename: LABPROT, INR,  in the last 72 hours Radiology: No results found.   Assessment/Plan: S/P Procedure(s) (LRB): ESOPHAGECTOMY COMPLETE (N/A) VIDEO BRONCHOSCOPY (N/A) swallow today     Merilee Wible B 07/14/2013 8:04 AM

## 2013-07-14 NOTE — Progress Notes (Signed)
Pt amb 5109ft with stand by assistance. Pt did not have any complaints. Pt assisted to chair for evening meal. Call bell within reach. Will continue to monitor pt closely.

## 2013-07-14 NOTE — Progress Notes (Signed)
Pt transferred to [facility] bed 2018. CCMD notified of transfer. VSS. Pt assessed. Call bell within reach. Will continue to monitor pt closely.

## 2013-07-14 NOTE — Progress Notes (Signed)
Patient transported to Radiology via wheelchair, and on monitor, for scheduled swallow/esophageal study.  VSS. Denies pain. A/o x 4.  Escorted by [employer] and [facility] from SICU.

## 2013-07-14 NOTE — Progress Notes (Addendum)
Verbal/Phone report given to "Tinnie Gens" RN on unit 2000. Patient to be transferred to room 2018 via wheelchair and on monitor.  All personal belongings including cell phone, laptop computer, and charger sent with patient to room 2018. All meds and chart given to [place], [place] on unit 2000.  No acute distress noted. Family updated on room transfer per patient.

## 2013-07-15 MED ORDER — METOPROLOL TARTRATE 25 MG/10 ML ORAL SUSPENSION
25.0000 mg | Freq: Two times a day (BID) | ORAL | Status: DC
  Administered 2013-07-15 – 2013-07-17 (×6): 25 mg
  Filled 2013-07-15 (×8): qty 10

## 2013-07-15 MED ORDER — VITAL AF 1.2 CAL PO LIQD
1000.0000 mL | ORAL | Status: DC
  Filled 2013-07-15 (×2): qty 1000

## 2013-07-15 NOTE — Progress Notes (Signed)
Pt ambulated 700 ft in hallway on room air tolerated very well.

## 2013-07-15 NOTE — Progress Notes (Signed)
Pt ambulated 800 ft independently on RA, steady gait and pace, tolerated very well, back to recliner.  Will continue to monitor and encourage. Ave Filter

## 2013-07-15 NOTE — Progress Notes (Addendum)
[address]       [place] [address]             [phone]      8 Days Post-Op  Procedure(s) (LRB): ESOPHAGECTOMY COMPLETE (N/A) VIDEO BRONCHOSCOPY (N/A) Subjective: Feels ok, no nausea  Objective  Telemetry tachy at times with activity  Temp:  [97.3 F (36.3 C)-99 F (37.2 C)] 97.3 F (36.3 C) (08/05 0426) Pulse Rate:  [85-109] 86 (08/05 0426) Resp:  [13-22] 18 (08/05 0426) BP: (114-127)/(63-84) 114/68 mmHg (08/05 0426) SpO2:  [92 %-95 %] 95 % (08/05 0426) Weight:  [222 lb 0.1 oz (100.7 kg)] 222 lb 0.1 oz (100.7 kg) (08/05 0426)   Intake/Output Summary (Last 24 hours) at 07/15/13 0842 Last data filed at 07/15/13 0807  Gross per 24 hour  Intake   2640 ml  Output   1800 ml  Net    840 ml       General appearance: alert, cooperative and no distress Heart: regular rate and rhythm Lungs: dim in bases- mildly Abdomen: soft, nondistended Extremities: no edema Wound: incisions healing well  Lab Results: No results found for this basename: NA, K, CL, CO2, GLUCOSE, BUN, CREATININE, CALCIUM, MG, PHOS,  in the last 72 hours No results found for this basename: AST, ALT, ALKPHOS, BILITOT, PROT, ALBUMIN,  in the last 72 hours No results found for this basename: LIPASE, AMYLASE,  in the last 72 hours No results found for this basename: WBC, NEUTROABS, HGB, HCT, MCV, PLT,  in the last 72 hours No results found for this basename: CKTOTAL, CKMB, TROPONINI,  in the last 72 hours No components found with this basename: POCBNP,  No results found for this basename: DDIMER,  in the last 72 hours No results found for this basename: HGBA1C,  in the last 72 hours No results found for this basename: CHOL, HDL, LDLCALC, TRIG, CHOLHDL,  in the last 72 hours No results found for this basename: TSH, T4TOTAL, FREET3, T3FREE, THYROIDAB,  in the last 72 hours No results found for this basename: VITAMINB12, FOLATE, FERRITIN, TIBC, IRON, RETICCTPCT,  in the last 72  hours  Medications: Scheduled . enoxaparin  40 mg Subcutaneous Daily  . feeding supplement (VITAL AF 1.2 CAL)  1,000 mL Per Tube Q24H  . metoprolol tartrate  12.5 mg Per Tube BID  . pantoprazole (PROTONIX) IV  40 mg Intravenous Q12H  . sodium chloride  10 mL Intravenous Q12H  . sodium chloride  10-40 mL Intracatheter Q12H     Radiology/Studies:  Dg Esophagus W/water Sol Cm  07/14/2013   *RADIOLOGY REPORT*  Clinical Data:Esophageal cancer.  Esophageal resection and gastric pull-through.  ESOPHAGUS/BARIUM SWALLOW/TABLET STUDY  Fluoroscopy Time: 1 minute, 6 seconds.  Comparison: Preoperative study 03/06/2013  Findings: The patient is status post esophagectomy and gastric pull- through.  Water-soluble swallow demonstrates no evidence of anastomotic leak in the upper thorax.  Imaging over the lower chest and upper abdomen demonstrates no evidence of obstruction or leak.  IMPRESSION: No evidence of postoperative leak.   Original Report Authenticated By: Charlett Nose, M.D.    INR: Will add last result for INR, ABG once components are confirmed Will add last 4 CBG results once components are confirmed  Assessment/Plan: S/P Procedure(s) (LRB): ESOPHAGECTOMY COMPLETE (N/A) VIDEO BRONCHOSCOPY (N/A)  1 doing well , start clear liquids  2 TF's can be advanced 3 increase lopressor to 25 bid    LOS: 8 days    Joel Osborne,Joel Osborne  8/5/20148:42 AM  Tolerated clear liquids, will advance Started advancing neck drain out I have seen and examined Joel Osborne and agree with the above assessment  and plan.  Delight Ovens MD Beeper [phone] Office [phone] 07/15/2013 3:55 PM

## 2013-07-16 MED ORDER — BOOST / RESOURCE BREEZE PO LIQD
1.0000 | ORAL | Status: DC
  Administered 2013-07-16 – 2013-07-17 (×2): 1 via ORAL

## 2013-07-16 MED ORDER — VITAL AF 1.2 CAL PO LIQD
1000.0000 mL | ORAL | Status: DC
  Filled 2013-07-16 (×3): qty 1000

## 2013-07-16 NOTE — Progress Notes (Addendum)
[address]       [place], [address]             [phone]      9 Days Post-Op  Procedure(s) (LRB): ESOPHAGECTOMY COMPLETE (N/A) VIDEO BRONCHOSCOPY (N/A) Subjective: Tolerating diet  Objective  Telemetry sinus rhythm, sinus tach  Temp:  [98.6 F (37 C)-99.6 F (37.6 C)] 98.6 F (37 C) (08/06 0432) Pulse Rate:  [90-98] 98 (08/06 0432) Resp:  [18] 18 (08/06 0432) BP: (112-125)/(65-71) 115/71 mmHg (08/06 0432) SpO2:  [93 %-95 %] 93 % (08/06 0432)   Intake/Output Summary (Last 24 hours) at 07/16/13 0825 Last data filed at 07/16/13 6962  Gross per 24 hour  Intake   1080 ml  Output    950 ml  Net    130 ml       General appearance: alert, cooperative and no distress Heart: regular rate and rhythm Lungs: clear to auscultation bilaterally Abdomen: soft, nondistended, + BS Extremities: no edema Wound: incis healing well  Lab Results: No results found for this basename: NA, K, CL, CO2, GLUCOSE, BUN, CREATININE, CALCIUM, MG, PHOS,  in the last 72 hours No results found for this basename: AST, ALT, ALKPHOS, BILITOT, PROT, ALBUMIN,  in the last 72 hours No results found for this basename: LIPASE, AMYLASE,  in the last 72 hours No results found for this basename: WBC, NEUTROABS, HGB, HCT, MCV, PLT,  in the last 72 hours No results found for this basename: CKTOTAL, CKMB, TROPONINI,  in the last 72 hours No components found with this basename: POCBNP,  No results found for this basename: DDIMER,  in the last 72 hours No results found for this basename: HGBA1C,  in the last 72 hours No results found for this basename: CHOL, HDL, LDLCALC, TRIG, CHOLHDL,  in the last 72 hours No results found for this basename: TSH, T4TOTAL, FREET3, T3FREE, THYROIDAB,  in the last 72 hours No results found for this basename: VITAMINB12, FOLATE, FERRITIN, TIBC, IRON, RETICCTPCT,  in the last 72 hours  Medications: Scheduled . enoxaparin  40 mg Subcutaneous Daily  .  feeding supplement (VITAL AF 1.2 CAL)  1,000 mL Per Tube Q24H  . metoprolol tartrate  25 mg Per Tube BID  . pantoprazole (PROTONIX) IV  40 mg Intravenous Q12H  . sodium chloride  10 mL Intravenous Q12H  . sodium chloride  10-40 mL Intracatheter Q12H     Radiology/Studies:  Dg Esophagus W/water Sol Cm  07/14/2013   *RADIOLOGY REPORT*  Clinical Data:Esophageal cancer.  Esophageal resection and gastric pull-through.  ESOPHAGUS/BARIUM SWALLOW/TABLET STUDY  Fluoroscopy Time: 1 minute, 6 seconds.  Comparison: Preoperative study 03/06/2013  Findings: The patient is status post esophagectomy and gastric pull- through.  Water-soluble swallow demonstrates no evidence of anastomotic leak in the upper thorax.  Imaging over the lower chest and upper abdomen demonstrates no evidence of obstruction or leak.  IMPRESSION: No evidence of postoperative leak.   Original Report Authenticated By: Charlett Nose, M.D.    INR: Will add last result for INR, ABG once components are confirmed Will add last 4 CBG results once components are confirmed  Assessment/Plan: S/P Procedure(s) (LRB): ESOPHAGECTOMY COMPLETE (N/A) VIDEO BRONCHOSCOPY (N/A)  1 conts to do well- cont current diet 2 penrose advanced further    LOS: 9 days    Joel Osborne,Joel Osborne 8/6/20148:25 AM  I have seen and examined Joel Osborne and agree with the above assessment  and plan.  Gwenith Daily  Tyrone Sage MD Beeper [phone] Office [phone] 07/16/2013  5:30 pm

## 2013-07-16 NOTE — Progress Notes (Addendum)
NUTRITION FOLLOW UP  Intervention:    1. Resource Breeze po daily, each supplement provides 250 kcal and 9 grams of protein.   Nutrition Dx:   Inadequate oral intake now related to limited appetite/food tolerance as evidenced by full liquid diet.    Goal:   Intake to meet >/=90% estimated nutrition needs. Likely unmet  Monitor:   PO intake, weight trends, labs  Assessment:   Pt was able to advance diet to full liquids. Tolerating well at this time per pt. C/o some increased secretions with milk products. Pt does have J-tube access current orders are for Vital AF 1.2 at 70 ml/hr, ?nocturnal? No TF hanging in room at this time. Pt is eating 100% of full liquid tray, if also consumes [employer], will likely be able to meet nutrition needs with oral intake.     Height: Ht Readings from Last 1 Encounters:  07/07/13 6' (1.829 m)    Weight Status:   Wt Readings from Last 1 Encounters:  07/15/13 222 lb 0.1 oz (100.7 kg)   Re-estimated needs:  Kcal: 2150 - 2400  Protein: 130 - 150 g  Fluid: 2.1 - 2.4 liters daily   Skin:  Abdomen incision  Neck incision   Diet Order: Full Liquid   Intake/Output Summary (Last 24 hours) at 07/16/13 1047 Last data filed at 07/16/13 0710  Gross per 24 hour  Intake   1440 ml  Output    950 ml  Net    490 ml    Last BM: 8/5   Labs:   Recent Labs Lab 07/10/13 0444 07/11/13 0500 07/12/13 0400  NA 138 140 138  K 3.9 3.5 3.7  CL 105 105 103  CO2 26 27 27   BUN 8 9 11   CREATININE 0.66 0.60 0.65  CALCIUM 8.7 8.9 9.0  GLUCOSE 112* 129* 108*    CBG (last 3)  No results found for this basename: GLUCAP,  in the last 72 hours  Scheduled Meds: . enoxaparin  40 mg Subcutaneous Daily  . feeding supplement (VITAL AF 1.2 CAL)  1,000 mL Per Tube Q24H  . metoprolol tartrate  25 mg Per Tube BID  . pantoprazole (PROTONIX) IV  40 mg Intravenous Q12H  . sodium chloride  10 mL Intravenous Q12H  . sodium chloride  10-40 mL Intracatheter  Q12H    Continuous Infusions: . 0.9 % NaCl with KCl 40 mEq / L 50 mL/hr at 07/16/13 0652    Clarene Duke RD, LDN Pager [phone] After Hours pager [phone]

## 2013-07-17 NOTE — Progress Notes (Addendum)
      [address]       [place] [address]             [phone]      10 Days Post-Op  Procedure(s) (LRB): ESOPHAGECTOMY COMPLETE (N/A) VIDEO BRONCHOSCOPY (N/A) Subjective: Feels well, tolerating DIII diet well  Objective  Telemetry sinus rhythm/tach  Temp:  [98.5 F (36.9 C)-99.5 F (37.5 C)] 98.9 F (37.2 C) (08/07 0618) Pulse Rate:  [87-109] 87 (08/07 0618) Resp:  [18] 18 (08/07 0618) BP: (122-135)/(73-78) 127/73 mmHg (08/07 0618) SpO2:  [93 %-94 %] 93 % (08/07 0618)   Intake/Output Summary (Last 24 hours) at 07/17/13 0821 Last data filed at 07/17/13 0238  Gross per 24 hour  Intake 3678.5 ml  Output   1100 ml  Net 2578.5 ml       General appearance: alert, cooperative and no distress Heart: regular rate and rhythm Lungs: mildly dim in bases Abdomen: soft, + BS, nondistended Extremities: no edema Wound: incis healing well  Lab Results: No results found for this basename: NA, K, CL, CO2, GLUCOSE, BUN, CREATININE, CALCIUM, MG, PHOS,  in the last 72 hours No results found for this basename: AST, ALT, ALKPHOS, BILITOT, PROT, ALBUMIN,  in the last 72 hours No results found for this basename: LIPASE, AMYLASE,  in the last 72 hours No results found for this basename: WBC, NEUTROABS, HGB, HCT, MCV, PLT,  in the last 72 hours No results found for this basename: CKTOTAL, CKMB, TROPONINI,  in the last 72 hours No components found with this basename: POCBNP,  No results found for this basename: DDIMER,  in the last 72 hours No results found for this basename: HGBA1C,  in the last 72 hours No results found for this basename: CHOL, HDL, LDLCALC, TRIG, CHOLHDL,  in the last 72 hours No results found for this basename: TSH, T4TOTAL, FREET3, T3FREE, THYROIDAB,  in the last 72 hours No results found for this basename: VITAMINB12, FOLATE, FERRITIN, TIBC, IRON, RETICCTPCT,  in the last 72 hours  Medications: Scheduled . enoxaparin  40 mg Subcutaneous  Daily  . feeding supplement  1 Container Oral Q24H  . feeding supplement (VITAL AF 1.2 CAL)  1,000 mL Per Tube Q24H  . metoprolol tartrate  25 mg Per Tube BID  . pantoprazole (PROTONIX) IV  40 mg Intravenous Q12H  . sodium chloride  10 mL Intravenous Q12H  . sodium chloride  10-40 mL Intracatheter Q12H     Radiology/Studies:  No results found.  INR: Will add last result for INR, ABG once components are confirmed Will add last 4 CBG results once components are confirmed  Assessment/Plan: S/P Procedure(s) (LRB): ESOPHAGECTOMY COMPLETE (N/A) VIDEO BRONCHOSCOPY (N/A)  1 conts to do well, poss home in am 2 cont rehab  LOS: 10 days    Joel Osborne,Joel Osborne 8/7/20148:21 AM  D/c central line Neck drain  Removed On soft diet D/c central line today I have seen and examined Joel Osborne and agree with the above assessment  and plan.  Delight Ovens MD Beeper [phone] Office [phone] 07/17/2013 9:13 AM

## 2013-07-17 NOTE — Discharge Summary (Signed)
[address]       [place] [address]             [phone]       Joel Osborne October 25, 1966 46 y.o. [id number]  07/07/2013 07/18/2013  No att. providers found  Ca esophagus  History of Present Illness: at the time of admission Patient is 47 yo who began an intentional weight loss program at Thanksgiving of last year and has lost greater than 30 pounds. He noted 3 to four-month history of solid dysphagia. He saw Dr. Jarold Motto and an esophagram on 03/06/2013 revealed a large irregular filling defect within the distal esophagus a second smaller persistent filling defect was noted in the mid to distal thoracic esophagus below the level of the carina.  An upper endoscopy on 03/18/2013 revealed a bulky friable mass at the distal 6 cm of the esophagus and involving a gastric cardia. The mass was obstructive. Retroflexed view in the stomach revealed the ulcerative mass in the cardia. The remainder of the stomach was normal. The duodenal bulb and postbulbar duodenum are normal. A biopsy confirmed adenocarcinoma. HER-2/NEU BY FISH - NO AMPLIFICATION OF HER-2 DETECTED. Accession: [id number]  He was referred for staging CTs of the chest, abdomen, and pelvis on 03/20/2013. A lower right paratracheal node measured 11 mm. A high right paratracheal node measured 8 mm. Small left periaortic nodes were also seen. No evidence of pulmonary metastases. An irregular mass was noted at the distal esophagus. 9 mm lymph node adjacent to the distal esophagus. No significant adenopathy in the gastrohepatic ligament. Small periportal nodes were felt to be benign. A low-density lesion in the inferior right liver measured 15 mm and had peripheral enhancement.  The patient now has completed course of chemotherapy on May 27, and radiation therapy.  Is been tolerating therapy relatively well, and has been able to maintain his nutritional status. He notes he is eating better . Since completion of  radiation and chemo therapy a MRI of the liver and PET scan has been done.the patient was admitted for transhiatal esophagectomy,feeding jejunostomy, pyloroplasty and bronchoscopy.   Past Medical History   Diagnosis  Date   .  Hyperlipemia    .  Colon polyp    .  Pre-diabetes    .  GERD (gastroesophageal reflux disease)    .  Diabetes mellitus without complication    .  Esophageal cancer  03/18/2013     Adenocarcinoma    Past Surgical History   Procedure  Laterality  Date   .  Appendectomy     .  Lipoma excision     .  Edg   03/18/2013     Esophageal Mass    Family History   Problem  Relation  Age of Onset   .  Colon cancer  Mother      dx in her 5s   .  Diabetes  Mother    .  Breast cancer  Maternal Aunt  81   .  Diabetes  Maternal Aunt    .  Esophageal cancer  Maternal Aunt  78   .  Kidney failure  Brother  48    History    Social History   .  Marital Status:  Married     Spouse Name:  N/A     Number of Children:  2   .  Years of Education:  N/A    Occupational History   .  Chiropractor  Social History Main Topics   .  Smoking status:  Never Smoker   .  Smokeless tobacco:  Never Used   .  Alcohol Use:  1.2 oz/week     2 Cans of beer per week   .  Drug Use:  No   .      History   Smoking status   .  Never Smoker   Smokeless tobacco   .  Never Used    History   Alcohol Use   .  1.2 oz/week   .  2 Cans of beer per week    No Known Allergies  Current Facility-Administered Medications   Medication  Dose  Route  Frequency  Provider  Last Rate  Last Dose   .  cefUROXime (ZINACEF) 1.5 g in dextrose 5 % 50 mL IVPB  1.5 g  Intravenous  60 min Pre-Op  Delight Ovens, MD       Hospital Course:   The patient was admitted and on 07/07/2013 taken to the operating room at which time he underwent the following procedure:  DATE OF PROCEDURE: 07/07/2013  DATE OF DISCHARGE:  OPERATIVE REPORT  PREOPERATIVE DIAGNOSIS: Adenocarcinoma of the distal esophagus.    POSTOPERATIVE DIAGNOSIS: Adenocarcinoma of the distal esophagus.  SURGICAL PROCEDURE: Transhiatal total esophagectomy with cervical  esophagogastrostomy, pyloroplasty, feeding jejunostomy, bronchoscopy.  SURGEON: Sheliah Plane, MD  FIRST ASSISTANT: Rowe Clack, PA-C  The patient  tolerated the procedure without obvious complication. No blood  transfusions were required. Estimated blood loss approximately 200-250  mL. He was taken to the postanesthesia care unit in stable condition.  Postoperative hospital course:  Overall the patient has progressed quite nicely. He was weaned from the ventilator without difficulty. He is maintained stable hemodynamics. He was initially started on jejunostomy tube feedings with a gradual transition to oral feedings after passing his initial swallowing study. He has tolerated gradually increasing activities. His incision are healing well and his neck penrose has been advanced and is now out. His overall status is stable for tentative discharge in the am.   Pathology:  Pathology revealed ulcerated invasive adenocarcinoma with neoadjuvant related change arising in a background of intestinal metaplasia consistent with Barrett's esophagitis and associated with high grade glandular dysplasia. Full details please see the detailed pathology report.   Recent Labs  07/18/13 0650  NA 138  K 4.0  CL 102  CO2 24  GLUCOSE 113*  BUN 12  CALCIUM 9.4    Recent Labs  07/18/13 0650  WBC 9.5  HGB 12.7*  HCT 38.1*  PLT 270   No results found for this basename: INR,  in the last 72 hours   Discharge Instructions:  The patient is discharged to home with extensive instructions on wound care and progressive ambulation.  They are instructed not to drive or perform any heavy lifting until returning to see the physician in his office.  Discharge Diagnosis:  Ca esophagus  Secondary Diagnosis: Patient Active Problem List   Diagnosis Date Noted  . Cancer of  distal third of esophagus 03/28/2013  . Dysphagia, unspecified 03/07/2013   Past Medical History  Diagnosis Date  . Hyperlipemia   . Colon polyp   . Pre-diabetes   . GERD (gastroesophageal reflux disease)   . Diabetes mellitus without complication   . Esophageal cancer 03/18/2013    Adenocarcinoma   Medications at time of discharge:   Medication List    STOP taking these medications  ibuprofen 200 MG tablet  Commonly known as:  ADVIL,MOTRIN     INVOKANA 300 MG Tabs  Generic drug:  Canagliflozin      TAKE these medications       atorvastatin 20 MG tablet  Commonly known as:  LIPITOR  Take 20 mg by mouth daily.     feeding supplement Liqd  Take 1 Container by mouth daily.     HYDROcodone-acetaminophen 7.5-325 mg/15 ml solution  Commonly known as:  HYCET  Take 15 mLs by mouth 4 (four) times daily as needed for pain.     metoprolol tartrate 25 MG tablet  Commonly known as:  LOPRESSOR  Take 1 tablet (25 mg total) by mouth 2 (two) times daily.     multivitamin with minerals tablet  Take 2 tablets by mouth daily. Chewable MVI     pantoprazole 40 MG tablet  Commonly known as:  PROTONIX  Take 1 tablet (40 mg total) by mouth daily.     zolpidem 5 MG tablet  Commonly known as:  AMBIEN  Take 1-2 tablets (5-10 mg total) by mouth at bedtime as needed for sleep.       Patient's condition is Good  Gershon Crane, PA-C 07/18/2013  11:42 AM

## 2013-07-18 ENCOUNTER — Inpatient Hospital Stay (HOSPITAL_COMMUNITY): Payer: BC Managed Care – PPO

## 2013-07-18 LAB — CBC
HCT: 38.1 % — ABNORMAL LOW (ref 39.0–52.0)
Hemoglobin: 12.7 g/dL — ABNORMAL LOW (ref 13.0–17.0)
MCH: 30 pg (ref 26.0–34.0)
MCHC: 33.3 g/dL (ref 30.0–36.0)
MCV: 90.1 fL (ref 78.0–100.0)
Platelets: 270 10*3/uL (ref 150–400)
RBC: 4.23 MIL/uL (ref 4.22–5.81)
RDW: 13.4 % (ref 11.5–15.5)
WBC: 9.5 10*3/uL (ref 4.0–10.5)

## 2013-07-18 LAB — BASIC METABOLIC PANEL
BUN: 12 mg/dL (ref 6–23)
CO2: 24 mEq/L (ref 19–32)
Calcium: 9.4 mg/dL (ref 8.4–10.5)
Chloride: 102 mEq/L (ref 96–112)
Creatinine, Ser: 0.65 mg/dL (ref 0.50–1.35)
GFR calc Af Amer: >90 mL/min (ref >90)
GFR calc non Af Amer: >90 mL/min (ref >90)
Glucose, Bld: 113 mg/dL — ABNORMAL HIGH (ref 70–99)
Potassium: 4 mEq/L (ref 3.5–5.1)
Sodium: 138 mEq/L (ref 135–145)

## 2013-07-18 MED ORDER — PANTOPRAZOLE SODIUM 40 MG PO TBEC: 40.0000 mg | DELAYED_RELEASE_TABLET | Freq: Every day | ORAL | Status: DC

## 2013-07-18 MED ORDER — METOPROLOL TARTRATE 25 MG PO TABS: 25.0000 mg | ORAL_TABLET | Freq: Two times a day (BID) | ORAL | Status: DC

## 2013-07-18 MED ORDER — BOOST / RESOURCE BREEZE PO LIQD: 1.0000 | ORAL | Status: DC

## 2013-07-18 MED ORDER — OXYCODONE-ACETAMINOPHEN 5-325 MG PO TABS: 1.0000 | ORAL_TABLET | ORAL | Status: DC | PRN

## 2013-07-18 MED ORDER — OXYCODONE-ACETAMINOPHEN 5-325 MG PO TABS
1.0000 | ORAL_TABLET | ORAL | Status: DC | PRN
  Administered 2013-07-18: 1 via ORAL
  Filled 2013-07-18: qty 2

## 2013-07-18 MED ORDER — METOPROLOL TARTRATE 25 MG PO TABS
25.0000 mg | ORAL_TABLET | Freq: Two times a day (BID) | ORAL | Status: DC
  Filled 2013-07-18 (×2): qty 1

## 2013-07-18 MED ORDER — HYDROCODONE-ACETAMINOPHEN 7.5-325 MG/15ML PO SOLN: 15.0000 mL | Freq: Four times a day (QID) | ORAL | Status: DC | PRN

## 2013-07-18 NOTE — Progress Notes (Signed)
      [address]       [place] [address]             [phone]       11 Days Post-Op Procedure(s) (LRB): ESOPHAGECTOMY COMPLETE (N/A) VIDEO BRONCHOSCOPY (N/A)  Subjective: Patient without complaints-hopes to go home today  Objective: Vital signs in last 24 hours: Temp:  [98.1 F (36.7 C)-99.2 F (37.3 C)] 98.1 F (36.7 C) (08/08 0436) Pulse Rate:  [74-97] 91 (08/08 0436) Cardiac Rhythm:  [-] Normal sinus rhythm (08/07 2100) Resp:  [18] 18 (08/08 0436) BP: (97-123)/(61-77) 97/61 mmHg (08/08 0436) SpO2:  [92 %-93 %] 93 % (08/08 0436)     Intake/Output from previous day: 08/07 0701 - 08/08 0700 In: 720 [P.O.:720] Out: 1401 [Urine:1400; Stool:1]   Physical Exam:  Cardiovascular: RRR Pulmonary: Clear to auscultation bilaterally; no rales, wheezes, or rhonchi. Abdomen: Soft, non tender, bowel sounds present. Extremities: No lower extremity edema. Wounds: Clean and dry.  No erythema or signs of infection.   Lab Results: CBC:No results found for this basename: WBC, HGB, HCT, PLT,  in the last 72 hours BMET: No results found for this basename: NA, K, CL, CO2, GLUCOSE, BUN, CREATININE, CALCIUM,  in the last 72 hours  PT/INR: No results found for this basename: LABPROT, INR,  in the last 72 hours ABG:  INR: Will add last result for INR, ABG once components are confirmed Will add last 4 CBG results once components are confirmed  Assessment/Plan:  1. CV - SR. On Lopressor 25 bid via tube 2.  Pulmonary - CXR this am shows small left pleural effusion, atelectasis on the left, no pneumothorax. 3.GI-tolerating dysphagia 3 diet. Will change lopressor and roxicet to oral 4.Remove remaining abdominal staples 5.[facility] discharge.  ZIMMERMAN,DONIELLE MPA-C 07/18/2013,7:45 AM

## 2013-07-18 NOTE — Progress Notes (Signed)
Discharged to home with family office visits in place teaching done  

## 2013-07-31 ENCOUNTER — Other Ambulatory Visit: Payer: Self-pay | Admitting: *Deleted

## 2013-08-01 ENCOUNTER — Telehealth: Payer: Self-pay | Admitting: *Deleted

## 2013-08-01 NOTE — Telephone Encounter (Signed)
Call from pt, asking if office visit should be moved out further. Next appt with Dr. Tyrone Sage is scheduled 8/25. Reviewed with Dr. Truett Perna, move appt out one month. Request sent to schedulers.

## 2013-08-04 ENCOUNTER — Other Ambulatory Visit: Payer: Self-pay | Admitting: Cardiothoracic Surgery

## 2013-08-04 ENCOUNTER — Ambulatory Visit
Admission: RE | Admit: 2013-08-04 | Discharge: 2013-08-04 | Disposition: A | Discharge status: Home / Self Care | Payer: BC Managed Care – PPO | Source: Ambulatory Visit | Attending: Cardiothoracic Surgery | Admitting: Cardiothoracic Surgery

## 2013-08-04 ENCOUNTER — Ambulatory Visit (INDEPENDENT_AMBULATORY_CARE_PROVIDER_SITE_OTHER): Payer: Self-pay | Admitting: Physician Assistant

## 2013-08-04 VITALS — BP 101/62 | HR 58 | Resp 16 | Ht 72.0 in | Wt 208.0 lb

## 2013-08-04 DIAGNOSIS — C159 Malignant neoplasm of esophagus, unspecified: Secondary | ICD-10-CM

## 2013-08-04 DIAGNOSIS — Z9049 Acquired absence of other specified parts of digestive tract: Secondary | ICD-10-CM

## 2013-08-04 DIAGNOSIS — Z09 Encounter for follow-up examination after completed treatment for conditions other than malignant neoplasm: Secondary | ICD-10-CM

## 2013-08-04 NOTE — Progress Notes (Signed)
       [address]       [name] [address]             [phone]          HPI: Patient returns for routine postoperative follow-up having undergone transhiatal esophagectomy by Dr. Tyrone Sage on 07/07/2013 . The patient's postoperative course was uneventful, an he was able to be discharged home on 07/18/2013 in good condition.  Since hospital discharge, the patient has continued to progress well.  He is eating small, frequent meals without problem.  His only issue has been some trouble swallowing gummy foods like bread.  He states he has to swallow several times before he feels like the bread goes down, but has not had any choking, reflux or vomiting.  He continues to have some incisional soreness.  His stamina is increasing.  Overall, he is feeling better.    Current Outpatient Prescriptions  Medication Sig Dispense Refill  . HYDROcodone-acetaminophen (HYCET) 7.5-325 mg/15 ml solution Take 15 mLs by mouth 4 (four) times daily as needed for pain.  180 mL  0  . metoprolol tartrate (LOPRESSOR) 25 MG tablet Take 1 tablet (25 mg total) by mouth 2 (two) times daily.  60 tablet  1  . Multiple Vitamins-Minerals (MULTIVITAMIN WITH MINERALS) tablet Take 2 tablets by mouth daily. Chewable MVI      . pantoprazole (PROTONIX) 40 MG tablet Take 1 tablet (40 mg total) by mouth daily.  30 tablet  1  . zolpidem (AMBIEN) 5 MG tablet Take 1-2 tablets (5-10 mg total) by mouth at bedtime as needed for sleep.  60 tablet  0  . atorvastatin (LIPITOR) 20 MG tablet Take 20 mg by mouth daily.       . feeding supplement (RESOURCE BREEZE) LIQD Take 1 Container by mouth daily.    0   No current facility-administered medications for this visit.     Physical Exam: BP 101/62 HR 58 Resp 16 Wounds: Neck and abdominal wounds are well healed.  Jejunostomy tube in place with no surrounding erythema or drainage. Heart: regular rate and rhythm Abdomen: Soft, nontender, nondistended, active bowel sounds  in all quadrants Lungs: Clear to auscultation   Diagnostic Tests: Chest xray: Dg Chest 2 View  08/04/2013   *RADIOLOGY REPORT*  Clinical Data: Trans hiatal esophagectomy.  CHEST - 2 VIEW  Comparison: 07/18/2013.  Findings: The pleural effusions have decreased compared to the prior exam.  Only a small amount of blunting of the left costophrenic angle is now evident on the frontal view. Cardiopericardial silhouette and mediastinal contours are within normal limits.  On the lateral view, there is tubing projected over the upper abdomen compatible with a residual drain.  Subsegmental atelectasis is present over the dorsal aspect of the cardiopericardial silhouette on the lateral view.  No airspace disease.  IMPRESSION:  1.  Decreasing bilateral pleural effusions with a tiny left residual effusion. 2.  Mild left basilar subsegmental atelectasis.   Original Report Authenticated By: Andreas Newport, M.D.       Assessment/Plan: The patient is doing well status post esophagectomy.  He is tolerating a po diet without problem.  Hopefully, his jejunostomy tube can be removed soon.  I have encouraged him not to force his diet, and continue to eat small, frequent, soft meals daily.  We will see him back in 2-3 weeks with Dr. Tyrone Sage.  He will follow up as directed with Dr. Truett Perna.

## 2013-08-05 ENCOUNTER — Telehealth: Payer: Self-pay | Admitting: Oncology

## 2013-08-05 ENCOUNTER — Ambulatory Visit: Payer: BC Managed Care – PPO | Admitting: Oncology

## 2013-08-05 NOTE — Telephone Encounter (Signed)
S/w wife re new appt for 10/9. Per 8/22 pof move 8/26 appt one mth out.

## 2013-08-06 ENCOUNTER — Other Ambulatory Visit: Payer: Self-pay

## 2013-08-06 DIAGNOSIS — G8918 Other acute postprocedural pain: Secondary | ICD-10-CM

## 2013-08-06 MED ORDER — HYDROCODONE-ACETAMINOPHEN 7.5-325 MG/15ML PO SOLN: 15.0000 mL | Freq: Four times a day (QID) | ORAL | Status: DC | PRN

## 2013-08-06 NOTE — Telephone Encounter (Signed)
Rx refilled called to [facility] for Hycet 7.5/325 mg /30ml solution, 180 ml/ norefills

## 2013-08-14 ENCOUNTER — Encounter: Payer: Self-pay | Admitting: Cardiothoracic Surgery

## 2013-08-14 ENCOUNTER — Ambulatory Visit (INDEPENDENT_AMBULATORY_CARE_PROVIDER_SITE_OTHER): Payer: BC Managed Care – PPO | Admitting: Cardiothoracic Surgery

## 2013-08-14 VITALS — BP 108/67 | HR 60 | Resp 20 | Ht 72.0 in | Wt 205.0 lb

## 2013-08-14 DIAGNOSIS — C159 Malignant neoplasm of esophagus, unspecified: Secondary | ICD-10-CM

## 2013-08-14 DIAGNOSIS — Z09 Encounter for follow-up examination after completed treatment for conditions other than malignant neoplasm: Secondary | ICD-10-CM

## 2013-08-14 DIAGNOSIS — Z9049 Acquired absence of other specified parts of digestive tract: Secondary | ICD-10-CM

## 2013-08-14 DIAGNOSIS — Z9889 Other specified postprocedural states: Secondary | ICD-10-CM

## 2013-08-14 NOTE — Progress Notes (Signed)
[address]       [place] [address]             [phone]                  Joel Osborne [place] Medical Record #[id number] Date of Birth: 11-Apr-1966  Joel Artist, MD Joel Fillers, MD  Chief Complaint:   PostOp Follow Up Visit 07/07/2013 PREOPERATIVE DIAGNOSIS: Adenocarcinoma of the distal esophagus.  POSTOPERATIVE DIAGNOSIS: Adenocarcinoma of the distal esophagus.   SURGICAL PROCEDURE: Transhiatal total esophagectomy with cervical  esophagogastrostomy, pyloroplasty, feeding jejunostomy, bronchoscopy.  SURGEON: Joel Plane, MD  1. 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 - TUMOR INVADES INTO SUBMUCOSA. - SIX LYMPH NODES, NEGATIVE FOR TUMOR (0/6). - PROXIMAL AND DISTAL SURGICAL MARGIN, NEGATIVE FOR TUMOR. - SEE TUMOR SYNOPTIC TEMPLATE BELOW. 2. Mesentery, Fat - FRAGMENTS OF BENIGN FIBROVASCULAR AND ADIPOSE TISSUE. - NEGATIVE FOR MALIGNANCY. Microscopic Comment 1. ESOPHAGUS/GE JUNCTION Specimen: Esophagus-proximal stomach Procedure: Esophagogastrectomy Tumor site: Distal esophagus Relationship of tumor to esophagogastric junction: immediately proximal to the esophago-gastric junction Maximum tumor size (cm): 4.5 cm, see comment. Histologic type: Adenocarcinoma Grade: Moderately differentiated - Grade II Margins: proximal- negative ; distal- negative; radial - negative Distance of carcinoma from nearest margin: 7 cm distal Microscopic tumor extension: Tumor extends into submucosa Lymph-Vascular invasion: Absent Perineural invasion: Absent Treatment effect: Present Lymph nodes: number examined - 6; number positive 0; number extracapsular extension 0 TNM code: ypT1b, ypN0, ypMX Clinical History: None Comments: Although the tumor grossly appears to be  4.5 cm, the majority of the tumor present within the slide sections examined consists of acellular mucin pools secondary to chemotherapy effect. However, 1 of 2 FINAL for Joel Osborne, Joel Osborne ([id number]) Microscopic Comment(continued) there are abundant foci of viable tumor present; some of which extend into submucosa and are adjacent to the muscularis propria. One of the six lymph nodes is entirely replaced by acellular mucin without viable tumor. As such, the node is considered negative for metastatic tumor. (CR:kh 07-09-12) Joel RUND DO Pathologist, Electronic Signature (Case signed 07/09/2013)  History of Present Illness:     Patient returns today in followup after recent transhiatal total esophagectomy adenocarcinoma of the distal esophagus ypT1b, ypN0, ypMX. He has been doing well postoperatively. He is returning to work part-time. He is taking a by mouth diet with moderate changes in his diet. His weight has been maintaining between 205 and 208. He's had occasional loose stools, no true dumping.      History  Smoking status  . Never Smoker   Smokeless tobacco  . Never Used       No Known Allergies  Current Outpatient Prescriptions  Medication Sig Dispense Refill  . feeding supplement (RESOURCE BREEZE) LIQD Take 1 Container by mouth daily.    0  . HYDROcodone-acetaminophen (HYCET) 7.5-325 mg/15 ml solution Take 15 mLs by mouth 4 (four) times daily as needed for pain.  180 mL  0  . metoprolol tartrate (LOPRESSOR) 25 MG tablet Take 1 tablet (25 mg total) by mouth 2 (two) times daily.  60 tablet  1  . Multiple Vitamins-Minerals (MULTIVITAMIN WITH MINERALS) tablet Take 2 tablets by mouth daily. Chewable MVI      . pantoprazole (PROTONIX) 40 MG tablet Take 1 tablet (40 mg total) by mouth daily.  30 tablet  1  . zolpidem (  AMBIEN) 5 MG tablet Take 1-2 tablets (5-10 mg total) by mouth at bedtime as needed for sleep.  60 tablet  0   No current facility-administered medications for  this visit.    Wt Readings from Last 3 Encounters:  08/14/13 205 lb (92.987 kg)  08/04/13 208 lb (94.348 kg)  07/15/13 222 lb 0.1 oz (100.7 kg)     Physical Exam: BP 108/67  Pulse 60  Resp 20  Ht 6' (1.829 m)  Wt 205 lb (92.987 kg)  BMI 27.8 kg/m2  SpO2 95%  General appearance: alert and cooperative Neurologic: intact Heart: regular rate and rhythm, S1, S2 normal, no murmur, click, rub or gallop and normal apical impulse Lungs: clear to auscultation bilaterally and normal percussion bilaterally Abdomen: soft, non-tender; bowel sounds normal; no masses,  no organomegaly Extremities: extremities normal, atraumatic, no cyanosis or edema and Homans sign is negative, no sign of DVT Wound: Neck and abdominal incisions are well-healed without evidence of infection  The patient's jejunostomy tube was removed in the office today without difficulty.  Diagnostic Studies & Laboratory data:         Recent Radiology Findings: Dg Chest 2 View  08/04/2013   *RADIOLOGY REPORT*  Clinical Data: Trans hiatal esophagectomy.  CHEST - 2 VIEW  Comparison: 07/18/2013.  Findings: The pleural effusions have decreased compared to the prior exam.  Only a small amount of blunting of the left costophrenic angle is now evident on the frontal view. Cardiopericardial silhouette and mediastinal contours are within normal limits.  On the lateral view, there is tubing projected over the upper abdomen compatible with a residual drain.  Subsegmental atelectasis is present over the dorsal aspect of the cardiopericardial silhouette on the lateral view.  No airspace disease.  IMPRESSION:  1.  Decreasing bilateral pleural effusions with a tiny left residual effusion. 2.  Mild left basilar subsegmental atelectasis.   Original Report Authenticated By: Joel Osborne, M.D.   Recent Labs: Lab Results  Component Value Date   WBC 9.5 07/18/2013   HGB 12.7* 07/18/2013   HCT 38.1* 07/18/2013   PLT 270 07/18/2013   GLUCOSE 113*  07/18/2013   ALT 20 07/03/2013   AST 16 07/03/2013   NA 138 07/18/2013   K 4.0 07/18/2013   CL 102 07/18/2013   CREATININE 0.65 07/18/2013   BUN 12 07/18/2013   CO2 24 07/18/2013   INR 0.94 07/03/2013      Assessment / Plan:      Patient doing well status post transhiatal esophagectomy after preoperative radiation and chemotherapy. I plan to see him back in 3 months,  Will coordinate with Dr. Truett Perna timing of followup CT scans.   Prather Failla B 08/14/2013 10:53 AM

## 2013-08-21 ENCOUNTER — Ambulatory Visit: Payer: BC Managed Care – PPO | Admitting: Cardiothoracic Surgery

## 2013-08-29 ENCOUNTER — Other Ambulatory Visit: Payer: Self-pay

## 2013-08-29 DIAGNOSIS — G8918 Other acute postprocedural pain: Secondary | ICD-10-CM

## 2013-08-29 MED ORDER — HYDROCODONE-ACETAMINOPHEN 7.5-325 MG/15ML PO SOLN: 15.0000 mL | Freq: Four times a day (QID) | ORAL | Status: DC | PRN

## 2013-08-29 NOTE — Telephone Encounter (Signed)
Rx for Hycet 7.5/ 325 mg/15 ml #180 ml no refill called to [facility] on [address]

## 2013-09-05 ENCOUNTER — Other Ambulatory Visit: Payer: Self-pay | Admitting: Oncology

## 2013-09-05 DIAGNOSIS — C155 Malignant neoplasm of lower third of esophagus: Secondary | ICD-10-CM

## 2013-09-18 ENCOUNTER — Telehealth: Payer: Self-pay | Admitting: Oncology

## 2013-09-18 ENCOUNTER — Ambulatory Visit (HOSPITAL_BASED_OUTPATIENT_CLINIC_OR_DEPARTMENT_OTHER): Payer: BC Managed Care – PPO | Admitting: Oncology

## 2013-09-18 VITALS — BP 134/76 | HR 82 | Temp 98.6°F | Resp 18 | Ht 72.0 in | Wt 200.6 lb

## 2013-09-18 DIAGNOSIS — C155 Malignant neoplasm of lower third of esophagus: Secondary | ICD-10-CM

## 2013-09-18 DIAGNOSIS — G8918 Other acute postprocedural pain: Secondary | ICD-10-CM

## 2013-09-18 DIAGNOSIS — R110 Nausea: Secondary | ICD-10-CM

## 2013-09-18 DIAGNOSIS — R1310 Dysphagia, unspecified: Secondary | ICD-10-CM

## 2013-09-18 MED ORDER — HYDROCODONE-ACETAMINOPHEN 7.5-325 MG/15ML PO SOLN: 15.0000 mL | Freq: Four times a day (QID) | ORAL | Status: DC | PRN

## 2013-09-18 NOTE — Telephone Encounter (Signed)
Gave pt appt for February 2015 MD only

## 2013-09-18 NOTE — Progress Notes (Signed)
   [facility]    OFFICE PROGRESS NOTE   INTERVAL HISTORY:   He underwent an esophagectomy procedure on 07/07/2013. He continues to recover from surgery. He is working full-time. He reports intermittent dysphagia and nausea with certain foods. Mild discomfort at the surgical site. The pathology from the esophagogastrectomy confirmed an ulcerated invasive adenocarcinoma, moderately differentiated, with treatment related changes. Tumor invaded into the submucosa. 6 lymph nodes were negative for tumor and the surgical margins were negative. Lymphovascular invasion and perineural invasion were absent. The tumor was staged as aypT1b,ypN0 lesion.  Objective:  Vital signs in last 24 hours:  Blood pressure 134/76, pulse 82, temperature 98.6 F (37 C), temperature source Oral, resp. rate 18, height 6' (1.829 m), weight 200 lb 9.6 oz (90.992 kg).    HEENT: Neck without mass Lymphatics: No cervical, supraclavicular, or axillary nodes Resp: Lungs clear bilaterally Cardio: Regular rate and rhythm GI: No hepatomegaly, mild tenderness surrounding the upper portion of the midline scar. No mass. Vascular: No leg edema.     Medications: I have reviewed the patient's current medications.  Assessment/Plan: 1.Adenocarcinoma of the distal esophagus/gastric cardia-status post an endoscopic biopsy on 03/18/2013 confirming adenocarcinoma  -Staging CT scans 03/20/2013 confirmed a distal esophageal/gastric cardia mass, distal paraesophageal lymph nodes and paratracheal nodes concerning for metastatic lymphadenopathy  -A PET scan 03/27/2013 with a multifocal area of hypermetabolic activity involving the thoracic esophagus extending into the gastric cardia and hypermetabolic mediastinal nodes, no distant metastatic disease  -Initiation of radiation 04/07/2013 and weekly Taxol/carboplatin 04/08/2013, radiation completed 05/15/2013  -Restaging PET scan 06/09/2013-no evidence of metastatic  disease, no hypermetabolic lymphadenopathy  -Esophagogastrectomy 07/07/2013 revealed a ypT1b,ypN0 tumor with negative surgical margins 2. Indeterminate 15 mm right liver lesion on the CT scan 03/20/2013 , MRI of the liver 06/09/2013 confirmed a right liver hemangioma  3. Anorexia/weight loss and solid dysphagia secondary to #1 -improved , he continues to have intermittent dysphagia and nausea following surgery 4. History of a colon polyp, status post a polypectomy October 2013  5. history of Thrombocytopenia secondary chemotherapy-the carboplatin wase dose reduced with week 5 of chemotherapy chemotherapy    Disposition:  He is recovering from the esophagogastrectomy. No evidence of recurrent esophagus cancer. He will continue attempts at improving his diet and gaining weight. He is scheduled to see Dr. Tyrone Sage in December. We will see him for an office visit in 4 months. He will contact us in the interim for new symptoms.   Thornton Papas, MD  09/18/2013  1:52 PM

## 2013-10-06 ENCOUNTER — Other Ambulatory Visit: Payer: Self-pay | Admitting: Physician Assistant

## 2013-10-17 ENCOUNTER — Other Ambulatory Visit: Payer: Self-pay | Admitting: Physician Assistant

## 2013-10-23 ENCOUNTER — Other Ambulatory Visit: Payer: Self-pay | Admitting: Physician Assistant

## 2013-10-24 ENCOUNTER — Other Ambulatory Visit: Payer: Self-pay | Admitting: *Deleted

## 2013-10-24 MED ORDER — PANTOPRAZOLE SODIUM 40 MG PO TBEC: 40.0000 mg | DELAYED_RELEASE_TABLET | Freq: Every day | ORAL | Status: DC

## 2013-11-13 ENCOUNTER — Ambulatory Visit: Payer: BC Managed Care – PPO | Admitting: Cardiothoracic Surgery

## 2013-11-19 ENCOUNTER — Other Ambulatory Visit: Payer: Self-pay | Admitting: *Deleted

## 2013-11-19 DIAGNOSIS — C155 Malignant neoplasm of lower third of esophagus: Secondary | ICD-10-CM

## 2013-11-20 ENCOUNTER — Ambulatory Visit (INDEPENDENT_AMBULATORY_CARE_PROVIDER_SITE_OTHER): Payer: BC Managed Care – PPO | Admitting: Cardiothoracic Surgery

## 2013-11-20 ENCOUNTER — Ambulatory Visit
Admission: RE | Admit: 2013-11-20 | Discharge: 2013-11-20 | Disposition: A | Discharge status: Home / Self Care | Payer: BC Managed Care – PPO | Source: Ambulatory Visit | Attending: Cardiothoracic Surgery | Admitting: Cardiothoracic Surgery

## 2013-11-20 ENCOUNTER — Encounter: Payer: Self-pay | Admitting: Cardiothoracic Surgery

## 2013-11-20 VITALS — BP 126/74 | HR 74 | Resp 18 | Ht 72.0 in | Wt 194.0 lb

## 2013-11-20 DIAGNOSIS — C155 Malignant neoplasm of lower third of esophagus: Secondary | ICD-10-CM

## 2013-11-20 DIAGNOSIS — C159 Malignant neoplasm of esophagus, unspecified: Secondary | ICD-10-CM

## 2013-11-20 DIAGNOSIS — Z09 Encounter for follow-up examination after completed treatment for conditions other than malignant neoplasm: Secondary | ICD-10-CM

## 2013-11-20 NOTE — Progress Notes (Signed)
[address]       [place] [address]             [phone]                     Joel Osborne [place] Medical Record #[id number] Date of Birth: May 09, 1966  Joel Matin, MD Garlan Fillers, MD  Chief Complaint:   PostOp Follow Up Visit 07/07/2013 PREOPERATIVE DIAGNOSIS: Adenocarcinoma of the distal esophagus.  POSTOPERATIVE DIAGNOSIS: Adenocarcinoma of the distal esophagus.   SURGICAL PROCEDURE: Transhiatal total esophagectomy with cervical  esophagogastrostomy, pyloroplasty, feeding jejunostomy, bronchoscopy.  SURGEON: Sheliah Plane, MD  1. 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 - TUMOR INVADES INTO SUBMUCOSA. - SIX LYMPH NODES, NEGATIVE FOR TUMOR (0/6). - PROXIMAL AND DISTAL SURGICAL MARGIN, NEGATIVE FOR TUMOR. - SEE TUMOR SYNOPTIC TEMPLATE BELOW. 2. Mesentery, Fat - FRAGMENTS OF BENIGN FIBROVASCULAR AND ADIPOSE TISSUE. - NEGATIVE FOR MALIGNANCY. Microscopic Comment 1. ESOPHAGUS/GE JUNCTION Specimen: Esophagus-proximal stomach Procedure: Esophagogastrectomy Tumor site: Distal esophagus Relationship of tumor to esophagogastric junction: immediately proximal to the esophago-gastric junction Maximum tumor size (cm): 4.5 cm, see comment. Histologic type: Adenocarcinoma Grade: Moderately differentiated - Grade II Margins: proximal- negative ; distal- negative; radial - negative Distance of carcinoma from nearest margin: 7 cm distal Microscopic tumor extension: Tumor extends into submucosa Lymph-Vascular invasion: Absent Perineural invasion: Absent Treatment effect: Present Lymph nodes: number examined - 6; number positive 0; number extracapsular extension 0 TNM code: ypT1b, ypN0, ypMX Clinical History: None Comments: Although the tumor grossly  appears to be 4.5 cm, the majority of the tumor present within the slide sections examined consists of acellular mucin pools secondary to chemotherapy effect. However, 1 of 2 FINAL for [name], BEER ([id number]) Microscopic Comment(continued) there are abundant foci of viable tumor present; some of which extend into submucosa and are adjacent to the muscularis propria. One of the six lymph nodes is entirely replaced by acellular mucin without viable tumor. As such, the node is considered negative for metastatic tumor. (CR:kh 07-09-12) [name] Pathologist, Electronic Signature (Case signed 07/09/2013)  History of Present Illness:     Patient returns today in followup after recent transhiatal total esophagectomy adenocarcinoma of the distal esophagus ypT1b, ypN0, ypMX. He has been doing well postoperatively. He is returning to work full-time. He is taking a by mouth diet with moderate changes in his diet. He does note that lactose intolerance and has been avoiding dairy products. He notes he is also been careful swallowing tough pieces of meat.   History  Smoking status  . Never Smoker   Smokeless tobacco  . Never Used       No Known Allergies  Current Outpatient Prescriptions  Medication Sig Dispense Refill  . atorvastatin (LIPITOR) 20 MG tablet Take 20 mg by mouth every other day.       Marland Kitchen HYDROcodone-acetaminophen (HYCET) 7.5-325 mg/15 ml solution Take 15 mLs by mouth 4 (four) times daily as needed for pain.  180 mL  0  . metoprolol tartrate (LOPRESSOR) 25 MG tablet Take 25 mg by mouth daily.      . Multiple Vitamins-Minerals (MULTIVITAMIN WITH MINERALS) tablet Take 2 tablets by mouth daily. Chewable MVI      . pantoprazole (PROTONIX) 40 MG tablet Take 1 tablet (40 mg total) by mouth daily.  30 tablet  3  . zolpidem (AMBIEN) 5 MG tablet TAKE ONE OR TWO TABLETS BY MOUTH NIGHTLY AT BEDTIME AS NEEDED for insomnia  60 tablet  0   No current facility-administered medications  for this visit.    Wt Readings from Last 3 Encounters:  11/20/13 194 lb (87.998 kg)  09/18/13 200 lb 9.6 oz (90.992 kg)  08/14/13 205 lb (92.987 kg)     Physical Exam: BP 126/74  Pulse 74  Resp 18  Ht 6' (1.829 m)  Wt 194 lb (87.998 kg)  BMI 26.31 kg/m2  SpO2 98%  General appearance: alert and cooperative Neurologic: intact Heart: regular rate and rhythm, S1, S2 normal, no murmur, click, rub or gallop and normal apical impulse Lungs: clear to auscultation bilaterally and normal percussion bilaterally Abdomen: soft, non-tender; bowel sounds normal; no masses,  no organomegaly Extremities: extremities normal, atraumatic, no cyanosis or edema and Homans sign is negative, no sign of DVT Wound: Neck and abdominal incisions are well-healed without evidence of infection or hernia, he does have some keloid formation at the neck incision    Diagnostic Studies & Laboratory data:         Recent Radiology Findings: Dg Chest 2 View  11/20/2013   CLINICAL DATA:  Esophageal cancer.  EXAM: CHEST  2 VIEW  COMPARISON:  08/04/2013  FINDINGS: The cardiomediastinal silhouette is within normal limits. The patient has taken a slightly greater inspiration than on the prior study. The lungs are clear without evidence of airspace consolidation, edema, pleural effusion, or pneumothorax. The osseous structures are unremarkable.  IMPRESSION: Clear lungs.   Electronically Signed   By: Sebastian Ache   On: 11/20/2013 11:51   Dg Chest 2 View  08/04/2013   *RADIOLOGY REPORT*  Clinical Data: Trans hiatal esophagectomy.  CHEST - 2 VIEW  Comparison: 07/18/2013.  Findings: The pleural effusions have decreased compared to the prior exam.  Only a small amount of blunting of the left costophrenic angle is now evident on the frontal view. Cardiopericardial silhouette and mediastinal contours are within normal limits.  On the lateral view, there is tubing projected over the upper abdomen compatible with a residual drain.   Subsegmental atelectasis is present over the dorsal aspect of the cardiopericardial silhouette on the lateral view.  No airspace disease.  IMPRESSION:  1.  Decreasing bilateral pleural effusions with a tiny left residual effusion. 2.  Mild left basilar subsegmental atelectasis.   Original Report Authenticated By: Andreas Newport, M.D.   Recent Labs: Lab Results  Component Value Date   WBC 9.5 07/18/2013   HGB 12.7* 07/18/2013   HCT 38.1* 07/18/2013   PLT 270 07/18/2013   GLUCOSE 113* 07/18/2013   ALT 20 07/03/2013   AST 16 07/03/2013   NA 138 07/18/2013   K 4.0 07/18/2013   CL 102 07/18/2013   CREATININE 0.65 07/18/2013   BUN 12 07/18/2013   CO2 24 07/18/2013   INR 0.94 07/03/2013      Assessment / Plan:      Patient doing well status post transhiatal esophagectomy after preoperative radiation and chemotherapy. I plan to see him back in 3 months,     Johny Pitstick B 11/20/2013 5:28 PM

## 2013-12-08 ENCOUNTER — Other Ambulatory Visit: Payer: Self-pay | Admitting: Physician Assistant

## 2014-01-16 ENCOUNTER — Telehealth: Payer: Self-pay | Admitting: Oncology

## 2014-01-16 ENCOUNTER — Ambulatory Visit (HOSPITAL_BASED_OUTPATIENT_CLINIC_OR_DEPARTMENT_OTHER): Payer: BC Managed Care – PPO | Admitting: Oncology

## 2014-01-16 VITALS — BP 117/64 | HR 70 | Temp 97.6°F | Resp 18 | Ht 72.0 in | Wt 182.3 lb

## 2014-01-16 DIAGNOSIS — R630 Anorexia: Secondary | ICD-10-CM

## 2014-01-16 DIAGNOSIS — R1310 Dysphagia, unspecified: Secondary | ICD-10-CM

## 2014-01-16 DIAGNOSIS — R634 Abnormal weight loss: Secondary | ICD-10-CM

## 2014-01-16 DIAGNOSIS — D1803 Hemangioma of intra-abdominal structures: Secondary | ICD-10-CM

## 2014-01-16 DIAGNOSIS — R110 Nausea: Secondary | ICD-10-CM

## 2014-01-16 DIAGNOSIS — C155 Malignant neoplasm of lower third of esophagus: Secondary | ICD-10-CM

## 2014-01-16 NOTE — Progress Notes (Signed)
   [place]    OFFICE PROGRESS NOTE   INTERVAL HISTORY:   He returns for scheduled followup of the esophagus cancer. He reports his weight has stabilized. He has GI upset with certain foods, especially milk products. No significant pain. He is working.  Objective:  Vital signs in last 24 hours:  Blood pressure 117/64, pulse 70, temperature 97.6 F (36.4 C), temperature source Oral, resp. rate 18, height 6' (1.829 m), weight 182 lb 4.8 oz (82.691 kg), SpO2 99.00%.    HEENT: Neck without mass Lymphatics: No cervical, supraclavicular, axillary, or inguinal nodes Resp: Lungs with coarse end inspiratory rales at the left upper posterior chest, otherwise clear, no respiratory distress Cardio: Regular rate and rhythm GI: No hepatomegaly, nontender, no mass Vascular: No leg edema    Medications: I have reviewed the patient's current medications.  Assessment/Plan: 1.Adenocarcinoma of the distal esophagus/gastric cardia-status post an endoscopic biopsy on 03/18/2013 confirming adenocarcinoma  -Staging CT scans 03/20/2013 confirmed a distal esophageal/gastric cardia mass, distal paraesophageal lymph nodes and paratracheal nodes concerning for metastatic lymphadenopathy  -A PET scan 03/27/2013 with a multifocal area of hypermetabolic activity involving the thoracic esophagus extending into the gastric cardia and hypermetabolic mediastinal nodes, no distant metastatic disease  -Initiation of radiation 04/07/2013 and weekly Taxol/carboplatin 04/08/2013, radiation completed 05/15/2013  -Restaging PET scan 06/09/2013-no evidence of metastatic disease, no hypermetabolic lymphadenopathy  -Esophagogastrectomy 07/07/2013 revealed a ypT1b,ypN0 tumor with negative surgical margins  2. Indeterminate 15 mm right liver lesion on the CT scan 03/20/2013 , MRI of the liver 06/09/2013 confirmed a right liver hemangioma  3. Anorexia/weight loss and solid dysphagia secondary to #1 -improved  , he continues to have intermittent dysphagia and nausea with certain foods, he has lost weight compared to when he was here in October but reports recent stabilization 4. History of a colon polyp, status post a polypectomy October 2013  5. history of Thrombocytopenia secondary chemotherapy-the carboplatin wase dose reduced with week 5 of chemotherapy chemotherapy    Disposition:  He remains in clinical remission from esophagus cancer. He will see Dr. Servando Snare in 3 months and return here in 6 months. I encouraged him to eat frequent small meals. He plans to followup with Dr. Philip Aspen to discuss the need for blood pressure or cholesterol medications. He is currently taking no medications. We discussed the indication for a surveillance CT scan. I do not recommend CT scans unless he has symptoms concerning for recurrent disease or Dr. Servando Snare recommends a scan for postoperative followup.   Betsy Coder, MD  01/16/2014  11:59 AM

## 2014-01-16 NOTE — Telephone Encounter (Signed)
s.w. pt wife and advised on Aug appt...ok adn aware

## 2014-02-03 ENCOUNTER — Telehealth: Payer: Self-pay | Admitting: Dietician

## 2014-02-03 NOTE — Telephone Encounter (Signed)
Brief Outpatient Oncology Nutrition Note  Patient has been identified to be at risk on malnutrition screen.  Wt Readings from Last 10 Encounters:  01/16/14 182 lb 4.8 oz (82.691 kg)  11/20/13 194 lb (87.998 kg)  09/18/13 200 lb 9.6 oz (90.992 kg)  08/14/13 205 lb (92.987 kg)  08/04/13 208 lb (94.348 kg)  07/15/13 222 lb 0.1 oz (100.7 kg)  07/15/13 222 lb 0.1 oz (100.7 kg)  07/03/13 222 lb 14.2 oz (101.1 kg)  06/30/13 224 lb (101.606 kg)  06/10/13 220 lb (99.791 kg)   Patient in clinical remission from esophagus cancer.  Called secondary to weight loss.  Patient was instructed by MD to eat several small meals daily.  Patient was not available.  [place] [address] contact information provided.  Antonieta Iba, RD, LDN

## 2014-02-19 ENCOUNTER — Ambulatory Visit: Payer: BC Managed Care – PPO | Admitting: Cardiothoracic Surgery

## 2014-03-26 ENCOUNTER — Ambulatory Visit (INDEPENDENT_AMBULATORY_CARE_PROVIDER_SITE_OTHER): Payer: BC Managed Care – PPO | Admitting: Cardiothoracic Surgery

## 2014-03-26 ENCOUNTER — Encounter: Payer: Self-pay | Admitting: Cardiothoracic Surgery

## 2014-03-26 VITALS — BP 111/69 | HR 60 | Resp 16 | Ht 72.0 in | Wt 184.0 lb

## 2014-03-26 DIAGNOSIS — Z09 Encounter for follow-up examination after completed treatment for conditions other than malignant neoplasm: Secondary | ICD-10-CM

## 2014-03-26 DIAGNOSIS — C159 Malignant neoplasm of esophagus, unspecified: Secondary | ICD-10-CM

## 2014-03-26 NOTE — Progress Notes (Signed)
[address] [address]       [place] [address]             [phone]                     Joel Osborne [facility] Osborne [facility] Medical Record #[id number] Date of Birth: 1966-08-21  Dr Benay Spice Dr Lisbeth Renshaw [name], MD  Chief Complaint:   PostOp Follow Up Visit 07/07/2013 PREOPERATIVE DIAGNOSIS: Adenocarcinoma of the distal esophagus.  POSTOPERATIVE DIAGNOSIS: Adenocarcinoma of the distal esophagus.   SURGICAL PROCEDURE: Transhiatal total esophagectomy with cervical  esophagogastrostomy, pyloroplasty, feeding jejunostomy, bronchoscopy.  SURGEON: Lanelle Bal, MD  1. 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 - TUMOR INVADES INTO SUBMUCOSA. - SIX LYMPH NODES, NEGATIVE FOR TUMOR (0/6). - PROXIMAL AND DISTAL SURGICAL MARGIN, NEGATIVE FOR TUMOR. - SEE TUMOR SYNOPTIC TEMPLATE BELOW. 2. Mesentery, Fat - FRAGMENTS OF BENIGN FIBROVASCULAR AND ADIPOSE TISSUE. - NEGATIVE FOR MALIGNANCY. Microscopic Comment 1. ESOPHAGUS/GE JUNCTION Specimen: Esophagus-proximal stomach Procedure: Esophagogastrectomy Tumor site: Distal esophagus Relationship of tumor to esophagogastric junction: immediately proximal to the esophago-gastric junction Maximum tumor size (cm): 4.5 cm, see comment. Histologic type: Adenocarcinoma Grade: Moderately differentiated - Grade II Margins: proximal- negative ; distal- negative; radial - negative Distance of carcinoma from nearest margin: 7 cm distal Microscopic tumor extension: Tumor extends into submucosa Lymph-Vascular invasion: Absent Perineural invasion: Absent Treatment effect: Present Lymph nodes: number examined - 6; number positive 0; number extracapsular extension 0 TNM code: ypT1b, ypN0, ypMX Clinical History: None Comments: Although the tumor grossly  appears to be 4.5 cm, the majority of the tumor present within the slide sections examined consists of acellular mucin pools secondary to chemotherapy effect. However, 1 of 2 FINAL for Joel Osborne, Joel Osborne ([id number]) Microscopic Comment(continued) there are abundant foci of viable tumor present; some of which extend into submucosa and are adjacent to the muscularis propria. One of the six lymph nodes is entirely replaced by acellular mucin without viable tumor. As such, the node is considered negative for metastatic tumor. (CR:kh 07-09-12) [place] RUND DO Pathologist, Electronic Signature (Case signed 07/09/2013)  Cancer of distal third of esophagus   Primary site: Esophagus - Adenocarcinoma   Staging method: AJCC 7th Edition   Pathologic: Stage IA (T1b, N0, cM0) signed by Joel Isaac, MD on 07/10/2013  7:02 AM   Summary: Stage IA (T1b, N0, cM0)  History of Present Illness:     Patient returns today in followup after recent transhiatal total esophagectomy adenocarcinoma of the distal esophagus ypT1b, ypN0, ypMX. He has been doing well postoperatively. He is returning to work full-time.  He does note that lactose intolerance and has been avoiding dairy products. Because of mildly elevated blood glucose he has been started on metformin .He notes he is also been careful swallowing tough pieces of meat and bread. He has no difficulty or indication of obstruction with swallowing.   History  Smoking status  . Never Smoker   Smokeless tobacco  . Never Used       No Known Allergies  Current Outpatient Prescriptions  Medication Sig Dispense Refill  . metFORMIN (GLUCOPHAGE) 500 MG tablet Take by mouth daily after supper.       No current facility-administered medications for this visit.    Wt Readings from Last 3 Encounters:  03/26/14 184 lb (83.462  kg)  01/16/14 182 lb 4.8 oz (82.691 kg)  11/20/13 194 lb (87.998 kg)     Physical Exam: BP 111/69  Pulse 60  Resp 16  Ht 6'  (1.829 m)  Wt 184 lb (83.462 kg)  BMI 24.95 kg/m2  SpO2 98%  General appearance: alert and cooperative Neurologic: intact Heart: regular rate and rhythm, S1, S2 normal, no murmur, click, rub or gallop and normal apical impulse Lungs: clear to auscultation bilaterally and normal percussion bilaterally Abdomen: soft, non-tender; bowel sounds normal; no masses,  no organomegaly Extremities: extremities normal, atraumatic, no cyanosis or edema and Homans sign is negative, no sign of DVT Wound: Neck and abdominal incisions are well-healed without evidence of infection or hernia, he does have some keloid formation at the neck incision, the scar itself still has some redness to it.    Diagnostic Studies & Laboratory data:         Recent Radiology Findings: Dg Chest 2 View  08/04/2013   *RADIOLOGY REPORT*  Clinical Data: Trans hiatal esophagectomy.  CHEST - 2 VIEW  Comparison: 07/18/2013.  Findings: The pleural effusions have decreased compared to the prior exam.  Only a small amount of blunting of the left costophrenic angle is now evident on the frontal view. Cardiopericardial silhouette and mediastinal contours are within normal limits.  On the lateral view, there is tubing projected over the upper abdomen compatible with a residual drain.  Subsegmental atelectasis is present over the dorsal aspect of the cardiopericardial silhouette on the lateral view.  No airspace disease.  IMPRESSION:  1.  Decreasing bilateral pleural effusions with a tiny left residual effusion. 2.  Mild left basilar subsegmental atelectasis.   Original Report Authenticated By: Dereck Ligas, M.D.   Recent Labs: Lab Results  Component Value Date   WBC 9.5 07/18/2013   HGB 12.7* 07/18/2013   HCT 38.1* 07/18/2013   PLT 270 07/18/2013   GLUCOSE 113* 07/18/2013   ALT 20 07/03/2013   AST 16 07/03/2013   NA 138 07/18/2013   K 4.0 07/18/2013   CL 102 07/18/2013   CREATININE 0.65 07/18/2013   BUN 12 07/18/2013   CO2 24 07/18/2013   INR 0.94  07/03/2013  Path:  1. 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 - TUMOR INVADES INTO SUBMUCOSA. - SIX LYMPH NODES, NEGATIVE FOR TUMOR (0/6). - PROXIMAL AND DISTAL SURGICAL MARGIN, NEGATIVE FOR TUMOR. - SEE TUMOR SYNOPTIC TEMPLATE BELOW. 2. Mesentery, Fat - FRAGMENTS OF BENIGN FIBROVASCULAR AND ADIPOSE TISSUE. - NEGATIVE FOR MALIGNANCY. Microscopic Comment 1. ESOPHAGUS/GE JUNCTION Specimen: Esophagus-proximal stomach Procedure: Esophagogastrectomy Tumor site: Distal esophagus Relationship of tumor to esophagogastric junction: immediately proximal to the esophago-gastric junction Maximum tumor size (cm): 4.5 cm, see comment. Histologic type: Adenocarcinoma Grade: Moderately differentiated - Grade II Margins: proximal- negative ; distal- negative; radial - negative Distance of carcinoma from nearest margin: 7 cm distal Microscopic tumor extension: Tumor extends into submucosa Lymph-Vascular invasion: Absent Perineural invasion: Absent Treatment effect: Present Lymph nodes: number examined - 6; number positive 0; number extracapsular extension 0 TNM code: ypT1b, ypN0, ypMX Clinical History: None Comments: Although the tumor grossly appears to be 4.5 cm, the majority of the tumor present within the slide sections examined consists of acellular mucin pools secondary to chemotherapy effect. However, 1 of 2 FINAL for Joel Osborne, Joel Osborne ([id number]) Microscopic Comment(continued) there are abundant foci of viable tumor present; some of which  extend into submucosa and are adjacent to the muscularis propria. One of the six lymph nodes is entirely replaced by acellular mucin without viable tumor. As such, the node is considered negative for metastatic tumor. (CR:kh 07-09-12) [place] RUND DO Pathologist, Electronic  Signature (Case signed 07/09/2013)     Assessment / Plan:      Patient doing well status post transhiatal esophagectomy after preoperative radiation and chemotherapy, now 9 months postop I plan to see him back in Nov 2015 with chest xray     Joel Osborne 03/26/2014 10:55 AM

## 2014-06-02 ENCOUNTER — Telehealth: Payer: Self-pay | Admitting: *Deleted

## 2014-06-02 NOTE — Telephone Encounter (Signed)
Per Dr. Benay Spice; notified pt that MD can see him 06/09/14 @ 4pm; call earlier if problems swallowing / eating/ drinking, etc.  Pt verbalized understanding of information.

## 2014-06-02 NOTE — Telephone Encounter (Signed)
Pt called states "went to urgent care last week for laryngitis, put on prednisone and it's not getting any better"  Pt reports he has had laryngitis for 2 weeks; can eat /drink/ swallow without difficulty; will be taking his last dose of prednisone today. "I'm concerned it's the cancer" Pt requesting earlier appt with Dr. Benay Spice --next scheduled is 07/17/14.  Note to Dr. Benay Spice.

## 2014-06-03 ENCOUNTER — Telehealth: Payer: Self-pay | Admitting: Oncology

## 2014-06-03 ENCOUNTER — Telehealth: Payer: Self-pay | Admitting: Internal Medicine

## 2014-06-03 NOTE — Telephone Encounter (Signed)
Talked to pt's wife and gave her appt for 6/30 MD only, a 30 minute appt but I cannot over lap appt

## 2014-06-03 NOTE — Telephone Encounter (Signed)
Spoke with pt and she is aware.

## 2014-06-03 NOTE — Telephone Encounter (Signed)
Pt has h/o esophageal cancer and has had laryngitis for 2 weeks. Pt wants to know if Dr. Henrene Pastor thinks he needs an EGD done to look at his larynx. Please advise.

## 2014-06-03 NOTE — Telephone Encounter (Signed)
Recommend ENT evaluation first to evaluate pharynx and vocal cords. Please give him my best. Thanks

## 2014-06-05 ENCOUNTER — Other Ambulatory Visit: Payer: Self-pay | Admitting: *Deleted

## 2014-06-05 ENCOUNTER — Other Ambulatory Visit: Payer: Self-pay | Admitting: Otolaryngology

## 2014-06-05 DIAGNOSIS — J3800 Paralysis of vocal cords and larynx, unspecified: Secondary | ICD-10-CM

## 2014-06-05 DIAGNOSIS — Z8501 Personal history of malignant neoplasm of esophagus: Secondary | ICD-10-CM

## 2014-06-05 DIAGNOSIS — R490 Dysphonia: Secondary | ICD-10-CM

## 2014-06-09 ENCOUNTER — Ambulatory Visit: Payer: BC Managed Care – PPO | Admitting: Oncology

## 2014-06-09 ENCOUNTER — Ambulatory Visit
Admission: RE | Admit: 2014-06-09 | Discharge: 2014-06-09 | Disposition: A | Discharge status: Home / Self Care | Payer: BC Managed Care – PPO | Source: Ambulatory Visit | Attending: Otolaryngology | Admitting: Otolaryngology

## 2014-06-09 ENCOUNTER — Other Ambulatory Visit: Payer: Self-pay

## 2014-06-09 DIAGNOSIS — Z8501 Personal history of malignant neoplasm of esophagus: Secondary | ICD-10-CM

## 2014-06-09 DIAGNOSIS — R490 Dysphonia: Secondary | ICD-10-CM

## 2014-06-09 DIAGNOSIS — J3800 Paralysis of vocal cords and larynx, unspecified: Secondary | ICD-10-CM

## 2014-06-09 DIAGNOSIS — C3490 Malignant neoplasm of unspecified part of unspecified bronchus or lung: Secondary | ICD-10-CM

## 2014-06-09 MED ORDER — IOHEXOL 300 MG/ML  SOLN
75.0000 mL | Freq: Once | INTRAMUSCULAR | Status: AC | PRN
  Administered 2014-06-09: 75 mL via INTRAVENOUS

## 2014-06-10 ENCOUNTER — Other Ambulatory Visit: Payer: Self-pay | Admitting: *Deleted

## 2014-06-11 ENCOUNTER — Telehealth: Payer: Self-pay | Admitting: Oncology

## 2014-06-11 NOTE — Telephone Encounter (Signed)
m °

## 2014-06-15 ENCOUNTER — Encounter (HOSPITAL_COMMUNITY)
Admission: RE | Admit: 2014-06-15 | Discharge: 2014-06-15 | Disposition: A | Discharge status: Home / Self Care | Payer: BC Managed Care – PPO | Source: Ambulatory Visit | Attending: Cardiothoracic Surgery | Admitting: Cardiothoracic Surgery

## 2014-06-15 ENCOUNTER — Telehealth: Payer: Self-pay | Admitting: Oncology

## 2014-06-15 DIAGNOSIS — C3490 Malignant neoplasm of unspecified part of unspecified bronchus or lung: Secondary | ICD-10-CM | POA: Insufficient documentation

## 2014-06-15 LAB — GLUCOSE, CAPILLARY: Glucose-Capillary: 98 mg/dL (ref 70–99)

## 2014-06-15 MED ORDER — FLUDEOXYGLUCOSE F - 18 (FDG) INJECTION
10.4000 | Freq: Once | INTRAVENOUS | Status: AC | PRN
  Administered 2014-06-15: 10.4 via INTRAVENOUS

## 2014-06-15 NOTE — Telephone Encounter (Signed)
Talked to pt and he had all his scans per pt notified nurse

## 2014-06-16 ENCOUNTER — Ambulatory Visit (INDEPENDENT_AMBULATORY_CARE_PROVIDER_SITE_OTHER): Payer: BC Managed Care – PPO | Admitting: Cardiothoracic Surgery

## 2014-06-16 ENCOUNTER — Encounter: Payer: Self-pay | Admitting: Cardiothoracic Surgery

## 2014-06-16 ENCOUNTER — Ambulatory Visit (HOSPITAL_BASED_OUTPATIENT_CLINIC_OR_DEPARTMENT_OTHER): Payer: BC Managed Care – PPO | Admitting: Oncology

## 2014-06-16 ENCOUNTER — Other Ambulatory Visit: Payer: Self-pay | Admitting: Oncology

## 2014-06-16 VITALS — BP 108/67 | HR 71 | Resp 20 | Ht 72.0 in | Wt 184.0 lb

## 2014-06-16 VITALS — BP 125/67 | HR 73 | Temp 98.6°F | Resp 18 | Ht 72.0 in | Wt 184.1 lb

## 2014-06-16 DIAGNOSIS — C155 Malignant neoplasm of lower third of esophagus: Secondary | ICD-10-CM

## 2014-06-16 DIAGNOSIS — J3800 Paralysis of vocal cords and larynx, unspecified: Secondary | ICD-10-CM

## 2014-06-16 DIAGNOSIS — C159 Malignant neoplasm of esophagus, unspecified: Secondary | ICD-10-CM

## 2014-06-16 DIAGNOSIS — Z09 Encounter for follow-up examination after completed treatment for conditions other than malignant neoplasm: Secondary | ICD-10-CM

## 2014-06-16 NOTE — Progress Notes (Signed)
[address]       [place] [address]             [phone]                     Joel Osborne [facility] Medical Record #[id number] Date of Birth: 1966-11-16  Dr Benay Spice Dr Lisbeth Renshaw [name], MD  Chief Complaint:   PostOp Follow Up Visit 07/07/2013 PREOPERATIVE DIAGNOSIS: Adenocarcinoma of the distal esophagus.  POSTOPERATIVE DIAGNOSIS: Adenocarcinoma of the distal esophagus.   SURGICAL PROCEDURE: Transhiatal total esophagectomy with cervical  esophagogastrostomy, pyloroplasty, feeding jejunostomy, bronchoscopy.  SURGEON: Lanelle Bal, MD  1. 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 - TUMOR INVADES INTO SUBMUCOSA. - SIX LYMPH NODES, NEGATIVE FOR TUMOR (0/6). - PROXIMAL AND DISTAL SURGICAL MARGIN, NEGATIVE FOR TUMOR. - SEE TUMOR SYNOPTIC TEMPLATE BELOW. 2. Mesentery, Fat - FRAGMENTS OF BENIGN FIBROVASCULAR AND ADIPOSE TISSUE. - NEGATIVE FOR MALIGNANCY. Microscopic Comment 1. ESOPHAGUS/GE JUNCTION Specimen: Esophagus-proximal stomach Procedure: Esophagogastrectomy Tumor site: Distal esophagus Relationship of tumor to esophagogastric junction: immediately proximal to the esophago-gastric junction Maximum tumor size (cm): 4.5 cm, see comment. Histologic type: Adenocarcinoma Grade: Moderately differentiated - Grade II Margins: proximal- negative ; distal- negative; radial - negative Distance of carcinoma from nearest margin: 7 cm distal Microscopic tumor extension: Tumor extends into submucosa Lymph-Vascular invasion: Absent Perineural invasion: Absent Treatment effect: Present Lymph nodes: number examined - 6; number positive 0; number extracapsular extension 0 TNM code: ypT1b, ypN0, ypMX Clinical History: None Comments: Although the tumor grossly  appears to be 4.5 cm, the majority of the tumor present within the slide sections examined consists of acellular mucin pools secondary to chemotherapy effect. However, 1 of 2 [name], [name] ([id number]) Microscopic Comment(continued) there are abundant foci of viable tumor present; some of which extend into submucosa and are adjacent to the muscularis propria. One of the six lymph nodes is entirely replaced by acellular mucin without viable tumor. As such, the node is considered negative for metastatic tumor. (CR:kh 07-09-12) Joel Osborne Pathologist, Electronic Signature (Case signed 07/09/2013)  Cancer of distal third of esophagus   Primary site: Esophagus - Adenocarcinoma   Staging method: AJCC 7th Edition   Pathologic: Stage IA (T1b, N0, cM0) signed by Joel Isaac, MD on 07/10/2013  7:02 AM   Summary: Stage IA (T1b, N0, cM0)  History of Present Illness:     Patient returns today in followup after recent transhiatal total esophagectomy adenocarcinoma of the distal esophagus ypT1b, ypN0, ypMX. The patient had been doing well following surgical resection. Maintain weight well, and taking a by mouth diet without difficulty. About 3 weeks ago he had sudden onset of hoarseness. He was seen by ENT, and found to have a paralyzed right vocal cord. Subsequently a CT of the chest and neck was done and PET scan.  History  Smoking status  . Never Smoker   Smokeless tobacco  . Never Used       No Known Allergies  Current Outpatient Prescriptions  Medication Sig Dispense Refill  . metFORMIN (GLUCOPHAGE) 500 MG tablet Take by mouth daily after supper.       No current facility-administered medications for this visit.    Wt Readings from Last 3 Encounters:  06/16/14 184 lb (83.462 kg)  06/16/14 184 lb 1.6  oz (83.507 kg)  03/26/14 184 lb (83.462 kg)     Physical Exam: BP 108/67  Pulse 71  Resp 20  Ht 6' (1.829 m)  Wt 184 lb (83.462 kg)  BMI 24.95 kg/m2  SpO2  98%  General appearance: alert and cooperative Neurologic: intact Heart: regular rate and rhythm, S1, S2 normal, no murmur, click, rub or gallop and normal apical impulse Lungs: clear to auscultation bilaterally and normal percussion bilaterally Abdomen: soft, non-tender; bowel sounds normal; no masses,  no organomegaly Extremities: extremities normal, atraumatic, no cyanosis or edema and Homans sign is negative, no sign of DVT Wound: Neck and abdominal incisions are well-healed without evidence of infection or hernia, he does have some keloid formation at the neck incision. Specifically I cannot palpate any cervical or supraclavicular or axillary nodes on exam.   Diagnostic Studies & Laboratory data:         Recent Radiology Findings: Ct Soft Tissue Neck W Contrast  06/09/2014   CLINICAL DATA:  Right vocal cord paralysis 2 weeks. History of distal esophageal carcinoma with resection, x-ray therapy, and chemotherapy July 2014  EXAM: CT NECK WITH CONTRAST  TECHNIQUE: Multidetector CT imaging of the neck was performed using the standard protocol following the bolus administration of intravenous contrast.  CONTRAST:  35mL OMNIPAQUE IOHEXOL 300 MG/ML  SOLN  COMPARISON:  PET-CT 06/09/2013  FINDINGS: Right vocal cord is paralyzed with enlargement of the ventricle and piriform sinus on the right. Right vocal cord is abducted. No vocal cord mass lesion identified.  Necrotic lymph node is present in the right tracheoesophageal groove just below the level of the thyroid. This is located posterior to the right subclavian artery and is in the region of the proximal right recurrent laryngeal nerve. This is felt to be recurrent metastatic disease to the lymph node given the history of esophageal carcinoma. There has been prior esophagectomy with gastric pull-through. No other cervical adenopathy is identified.  Left vocal cord is normal.  Lung apices are clear. Parotid and submandibular glands are normal. The  tongue is normal. Cervical spondylosis at C5-6 and C6-7.  IMPRESSION: Right vocal cord paralysis. Necrotic lymph node measuring 13 mm in the tracheoesophageal groove on the right felt to be metastatic lymph node from prior esophageal carcinoma and causing dysfunction of the right recurrent laryngeal nerve and vocal cord paralysis. This enlarged lymph node was not present on the prior PET-CT of 06/09/2013.   Electronically Signed   By: Franchot Gallo M.D.   On: 06/09/2014 11:42   Nm Pet Image Restag (ps) Skull Base To Thigh  06/15/2014   CLINICAL DATA:  Subsequent treatment strategy for esophageal cancer.  EXAM: NUCLEAR MEDICINE PET SKULL BASE TO THIGH  TECHNIQUE: 10.4 mCi F-18 FDG was injected intravenously. Full-ring PET imaging was performed from the skull base to thigh after the radiotracer. CT data was obtained and used for attenuation correction and anatomic localization.  FASTING BLOOD GLUCOSE:  Value: 90 a mg/dl  COMPARISON:  CT neck from 06/09/2014.  FINDINGS: NECK  No upper cervical hypermetabolic lymphadenopathy.  CHEST  The necrotic lymph node of concern on the recent CT scan is hypermetabolic on today's PET images (SUV max 4.1). This is located in the tracheoesophageal groove, just cranial to the anastomosis. There is no other hypermetabolic disease in the chest.  ABDOMEN/PELVIS  No abnormal hypermetabolic activity within the liver, pancreas, adrenal glands, or spleen. No hypermetabolic lymph nodes in the abdomen or pelvis.  Tiny calcified stones are seen in  the gallbladder.  SKELETON  No focal hypermetabolic activity to suggest skeletal metastasis.  IMPRESSION: A single focus of abnormal hypermetabolism is identified on this study and correlates to the necrotic lymph node identified in the upper mediastinum on recent neck CT, just cranial to the esophagogastric anastomosis. Imaging features are consistent with metastatic disease.   Electronically Signed   By: [name] M.D.   On: 06/15/2014 14:29    Dg Chest 2 View  08/04/2013   *RADIOLOGY REPORT*  Clinical Data: Trans hiatal esophagectomy.  CHEST - 2 VIEW  Comparison: 07/18/2013.  Findings: The pleural effusions have decreased compared to the prior exam.  Only a small amount of blunting of the left costophrenic angle is now evident on the frontal view. Cardiopericardial silhouette and mediastinal contours are within normal limits.  On the lateral view, there is tubing projected over the upper abdomen compatible with a residual drain.  Subsegmental atelectasis is present over the dorsal aspect of the cardiopericardial silhouette on the lateral view.  No airspace disease.  IMPRESSION:  1.  Decreasing bilateral pleural effusions with a tiny left residual effusion. 2.  Mild left basilar subsegmental atelectasis.   Original Report Authenticated By: Dereck Ligas, M.D.   Recent Labs: Lab Results  Component Value Date   WBC 9.5 07/18/2013   HGB 12.7* 07/18/2013   HCT 38.1* 07/18/2013   PLT 270 07/18/2013   GLUCOSE 113* 07/18/2013   ALT 20 07/03/2013   AST 16 07/03/2013   NA 138 07/18/2013   K 4.0 07/18/2013   CL 102 07/18/2013   CREATININE 0.65 07/18/2013   BUN 12 07/18/2013   CO2 24 07/18/2013   INR 0.94 07/03/2013  Path:  1. 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 - TUMOR INVADES INTO SUBMUCOSA. - SIX LYMPH NODES, NEGATIVE FOR TUMOR (0/6). - PROXIMAL AND DISTAL SURGICAL MARGIN, NEGATIVE FOR TUMOR. - SEE TUMOR SYNOPTIC TEMPLATE BELOW. 2. Mesentery, Fat - FRAGMENTS OF BENIGN FIBROVASCULAR AND ADIPOSE TISSUE. - NEGATIVE FOR MALIGNANCY. Microscopic Comment 1. ESOPHAGUS/GE JUNCTION Specimen: Esophagus-proximal stomach Procedure: Esophagogastrectomy Tumor site: Distal esophagus Relationship of tumor to esophagogastric junction: immediately proximal to the esophago-gastric  junction Maximum tumor size (cm): 4.5 cm, see comment. Histologic type: Adenocarcinoma Grade: Moderately differentiated - Grade II Margins: proximal- negative ; distal- negative; radial - negative Distance of carcinoma from nearest margin: 7 cm distal Microscopic tumor extension: Tumor extends into submucosa Lymph-Vascular invasion: Absent Perineural invasion: Absent Treatment effect: Present Lymph nodes: number examined - 6; number positive 0; number extracapsular extension 0 TNM code: ypT1b, ypN0, ypMX Clinical History: None Comments: Although the tumor grossly appears to be 4.5 cm, the majority of the tumor present within the slide sections examined consists of acellular mucin pools secondary to chemotherapy effect. However, 1 of 2 FINAL for OLUWATIMILEYIN, VIVIER ([id number]) Microscopic Comment(continued) there are abundant foci of viable tumor present; some of which extend into submucosa and are adjacent to the muscularis propria. One of the six lymph nodes is entirely replaced by acellular mucin without viable tumor. As such, the node is considered negative for metastatic tumor. (CR:kh 07-09-12) Joel Osborne Pathologist, Electronic Signature (Case signed 07/09/2013)     Assessment / Plan:    Likely progression of nodal disease status post previous chemotherapy radiation and surgical resection of distal esophageal cancer. It appears he has localized nodal recurrence in the right neck with vocal cord paralysis.  I discussed and reviewed the PET scan findings with him and recommended that we proceed with obtaining a tissue diagnosis. This could be done EUS or CT-guided percutaneous needle biopsy. We'll review the scans with Dr. Ardis Hughs and interventional radiology to determine the most effective and safest technique for biopsy. We'll review with Dr. Lisbeth Renshaw for consideration of localized radiation to the neck for treatment of recurrence.   After reviewing with GI and interventional  radiology we will arrange for a needle biopsy, I plan to see back next week after the biopsy is completed. Kameka Whan B 06/16/2014 1:09 PM

## 2014-06-16 NOTE — Progress Notes (Signed)
Joel Osborne OFFICE PROGRESS NOTE   Diagnosis: Esophagus cancer  INTERVAL HISTORY:   Joel Osborne returns prior to a scheduled visit. He reports an approximate 3 week history of hoarseness. He was referred to ENT and was found to have vocal cord paralysis. A CT of the neck 06/09/2012 revealed a necrotic lymph node in the right tracheoesophageal groove below the level of the thyroid. He is scheduled to see Dr. Servando Osborne later today. He otherwise feels well. He reports his weight has stabilized. No pain. He is working. Objective:  Vital signs in last 24 hours:  Blood pressure 125/67, pulse 73, temperature 98.6 F (37 C), temperature source Oral, resp. rate 18, height 6' (1.829 m), weight 184 lb 1.6 oz (83.507 kg).    HEENT: Oropharynx without visible mass, neck without mass Lymphatics: No cervical, supraclavicular, axillary, or inguinal nodes. Soft mobile fullness in the high right axilla measuring approximately 1 cm without a discrete palpable lymph node Resp: Lungs clear bilaterally Cardio: Regular rate and rhythm GI: No hepatomegaly, nontender, no mass Vascular: No leg edema    Imaging:  Nm Pet Image Restag (ps) Skull Base To Thigh  06/15/2014   CLINICAL DATA:  Subsequent treatment strategy for esophageal cancer.  EXAM: NUCLEAR MEDICINE PET SKULL BASE TO THIGH  TECHNIQUE: 10.4 mCi F-18 FDG was injected intravenously. Full-ring PET imaging was performed from the skull base to thigh after the radiotracer. CT data was obtained and used for attenuation correction and anatomic localization.  FASTING BLOOD GLUCOSE:  Value: 90 a mg/dl  COMPARISON:  CT neck from 06/09/2014.  FINDINGS: NECK  No upper cervical hypermetabolic lymphadenopathy.  CHEST  The necrotic lymph node of concern on the recent CT scan is hypermetabolic on today's PET images (SUV max 4.1). This is located in the tracheoesophageal groove, just cranial to the anastomosis. There is no other hypermetabolic disease  in the chest.  ABDOMEN/PELVIS  No abnormal hypermetabolic activity within the liver, pancreas, adrenal glands, or spleen. No hypermetabolic lymph nodes in the abdomen or pelvis.  Tiny calcified stones are seen in the gallbladder.  SKELETON  No focal hypermetabolic activity to suggest skeletal metastasis.  IMPRESSION: A single focus of abnormal hypermetabolism is identified on this study and correlates to the necrotic lymph node identified in the upper mediastinum on recent neck CT, just cranial to the esophagogastric anastomosis. Imaging features are consistent with metastatic disease.   Electronically Signed   By: [name] M.D.   On: 06/15/2014 14:29    Medications: I have reviewed the patient's current medications.  Assessment/Plan: 1.Adenocarcinoma of the distal esophagus/gastric cardia-status post an endoscopic biopsy on 03/18/2013 confirming adenocarcinoma  -Staging CT scans 03/20/2013 confirmed a distal esophageal/gastric cardia mass, distal paraesophageal lymph nodes and paratracheal nodes concerning for metastatic lymphadenopathy  -A PET scan 03/27/2013 with a multifocal area of hypermetabolic activity involving the thoracic esophagus extending into the gastric cardia and hypermetabolic mediastinal nodes, no distant metastatic disease  -Initiation of radiation 04/07/2013 and weekly Taxol/carboplatin 04/08/2013, radiation completed 05/15/2013  -Restaging PET scan 06/09/2013-no evidence of metastatic disease, no hypermetabolic lymphadenopathy  -Esophagogastrectomy 07/07/2013 revealed a ypT1b,ypN0 tumor with negative surgical margins  -CT neck 06/09/2014 revealed a necrotic lymph node in the right tracheoesophageal groove -PET scan 11/15/2014 revealed a single focus of hypermetabolism corresponding to the necrotic right upper mediastinum lymph node 2. Indeterminate 15 mm right liver lesion on the CT scan 03/20/2013 , MRI of the liver 06/09/2013 confirmed a right liver hemangioma  3.  Anorexia/weight  loss and solid dysphagia secondary to #1 -improved , he continues to have intermittent dysphagia and nausea with certain foods, he has lost weight compared to when he was here in October but reports recent stabilization  4. History of a colon polyp, status post a polypectomy October 2013  5. history of Thrombocytopenia secondary chemotherapy-the carboplatin wase dose reduced with week 5 of chemotherapy chemotherapy  6. hoarseness secondary to right vocal cord paralysis-CT/PET scan imaging consistent with a malignant right paratracheal lymph node causing the vocal cord paralysis    Disposition:  I reviewed the CT and PET scan images with Joel Osborne. He understands the hypermetabolic node is likely related to recurrent esophagus cancer. The only potentially curative therapy would be surgery. He is scheduled to see Dr. Servando Osborne later today. Dr. Lisbeth Osborne does not feel he is a candidate for further radiation. There are multiple systemic treatment options, but these will not be curative.  I will present his case at the GI tumor conference 06/17/2014. We will check a CEA when he returns for an office visit 07/01/2014. I will consider making a referral to the GI oncology service at [facility] pending Dr. Everrett Osborne recommendation and the tumor board discussion.  Joel Coder, MD  06/16/2014  12:14 PM

## 2014-06-17 ENCOUNTER — Telehealth: Payer: Self-pay | Admitting: Oncology

## 2014-06-17 ENCOUNTER — Telehealth: Payer: Self-pay

## 2014-06-17 DIAGNOSIS — C159 Malignant neoplasm of esophagus, unspecified: Secondary | ICD-10-CM

## 2014-06-17 NOTE — Telephone Encounter (Signed)
Procedure will be scheduled tomorrow to give Dr Newell Coral time to speak with the pt  Need to put in case hospital order and referral

## 2014-06-17 NOTE — Telephone Encounter (Signed)
, °

## 2014-06-17 NOTE — Telephone Encounter (Signed)
Message copied by Barron Alvine on Wed Jun 17, 2014  9:25 AM ------      Message from: Owens Loffler P      Created: Wed Jun 17, 2014  9:07 AM       Valeda Malm get him in next week for EUS.  Let him know he'll hear from my office today or tomorrow about that appointment.  Thanks                  Gabrielly Mccrystal,      He needs upper EUS, radial +/- linear, esopahgeal cancer, lymphadenopathy.  Next Thursday (16th), ++MAC.  Can you contact him later this afternoon or tomorrow to allow Dr. Servando Snare time to give him a heads up.  Thanks                        ----- Message -----         From: Grace Isaac, MD         Sent: 06/17/2014   8:44 AM           To: Milus Banister, MD            Linna Hoff, This patient has PET scan done now, discussed at GI conference. Percutaneous needle bx would be the riskiest to get bx. EUS with bx may be the best as then you can also check anastomosis visually for recurrence  .  If not I could do ebus and bx through  back of trachea. Let me know and I will contact him.             ------

## 2014-06-18 ENCOUNTER — Encounter (HOSPITAL_COMMUNITY): Payer: Self-pay | Admitting: Pharmacy Technician

## 2014-06-18 ENCOUNTER — Encounter (HOSPITAL_COMMUNITY): Payer: Self-pay | Admitting: *Deleted

## 2014-06-18 ENCOUNTER — Other Ambulatory Visit: Payer: Self-pay

## 2014-06-18 DIAGNOSIS — C159 Malignant neoplasm of esophagus, unspecified: Secondary | ICD-10-CM

## 2014-06-18 NOTE — Telephone Encounter (Signed)
EUS scheduled, pt instructed and medications reviewed.  Patient instructions mailed to home.  Patient to call with any questions or concerns.  

## 2014-06-19 ENCOUNTER — Encounter: Payer: Self-pay | Admitting: Gastroenterology

## 2014-06-19 ENCOUNTER — Encounter: Payer: Self-pay | Admitting: Radiation Oncology

## 2014-06-22 ENCOUNTER — Telehealth: Payer: Self-pay | Admitting: *Deleted

## 2014-06-22 NOTE — Telephone Encounter (Signed)
CALLED PATIENT TO INFORM OF FU WITH DR. MOODY ON 07-01-14, LVM FOR A RETURN CALL

## 2014-06-24 ENCOUNTER — Encounter: Payer: Self-pay | Admitting: *Deleted

## 2014-06-25 ENCOUNTER — Ambulatory Visit (HOSPITAL_COMMUNITY)
Admission: RE | Admit: 2014-06-25 | Discharge: 2014-06-25 | Disposition: A | Discharge status: Home / Self Care | Payer: BC Managed Care – PPO | Source: Ambulatory Visit | Attending: Gastroenterology | Admitting: Gastroenterology

## 2014-06-25 ENCOUNTER — Ambulatory Visit: Payer: BC Managed Care – PPO | Admitting: Cardiothoracic Surgery

## 2014-06-25 ENCOUNTER — Ambulatory Visit (HOSPITAL_COMMUNITY): Payer: BC Managed Care – PPO | Admitting: Anesthesiology

## 2014-06-25 ENCOUNTER — Encounter (HOSPITAL_COMMUNITY): Payer: BC Managed Care – PPO | Admitting: Anesthesiology

## 2014-06-25 ENCOUNTER — Encounter (HOSPITAL_COMMUNITY)
Admission: RE | Disposition: A | Discharge status: Home / Self Care | Payer: Self-pay | Source: Ambulatory Visit | Attending: Gastroenterology

## 2014-06-25 ENCOUNTER — Encounter (HOSPITAL_COMMUNITY): Payer: Self-pay | Admitting: Gastroenterology

## 2014-06-25 DIAGNOSIS — C770 Secondary and unspecified malignant neoplasm of lymph nodes of head, face and neck: Secondary | ICD-10-CM | POA: Insufficient documentation

## 2014-06-25 DIAGNOSIS — R634 Abnormal weight loss: Secondary | ICD-10-CM | POA: Insufficient documentation

## 2014-06-25 DIAGNOSIS — J3801 Paralysis of vocal cords and larynx, unilateral: Secondary | ICD-10-CM | POA: Insufficient documentation

## 2014-06-25 DIAGNOSIS — C160 Malignant neoplasm of cardia: Secondary | ICD-10-CM | POA: Insufficient documentation

## 2014-06-25 DIAGNOSIS — R1310 Dysphagia, unspecified: Secondary | ICD-10-CM | POA: Insufficient documentation

## 2014-06-25 DIAGNOSIS — E119 Type 2 diabetes mellitus without complications: Secondary | ICD-10-CM | POA: Insufficient documentation

## 2014-06-25 DIAGNOSIS — K2210 Ulcer of esophagus without bleeding: Secondary | ICD-10-CM | POA: Insufficient documentation

## 2014-06-25 DIAGNOSIS — Z923 Personal history of irradiation: Secondary | ICD-10-CM | POA: Insufficient documentation

## 2014-06-25 DIAGNOSIS — Z9221 Personal history of antineoplastic chemotherapy: Secondary | ICD-10-CM | POA: Insufficient documentation

## 2014-06-25 DIAGNOSIS — R630 Anorexia: Secondary | ICD-10-CM | POA: Insufficient documentation

## 2014-06-25 DIAGNOSIS — Z9089 Acquired absence of other organs: Secondary | ICD-10-CM | POA: Insufficient documentation

## 2014-06-25 DIAGNOSIS — C159 Malignant neoplasm of esophagus, unspecified: Secondary | ICD-10-CM

## 2014-06-25 HISTORY — PX: EUS: SHX5427

## 2014-06-25 LAB — GLUCOSE, CAPILLARY: GLUCOSE-CAPILLARY: 97 mg/dL (ref 70–99)

## 2014-06-25 SURGERY — UPPER ENDOSCOPIC ULTRASOUND (EUS) LINEAR
Anesthesia: Monitor Anesthesia Care

## 2014-06-25 MED ORDER — MIDAZOLAM HCL 2 MG/2ML IJ SOLN
INTRAMUSCULAR | Status: AC
  Filled 2014-06-25: qty 2

## 2014-06-25 MED ORDER — PROMETHAZINE HCL 25 MG/ML IJ SOLN: 6.2500 mg | INTRAMUSCULAR | Status: DC | PRN

## 2014-06-25 MED ORDER — PROPOFOL 10 MG/ML IV BOLUS
INTRAVENOUS | Status: AC
  Filled 2014-06-25: qty 20

## 2014-06-25 MED ORDER — LACTATED RINGERS IV SOLN
INTRAVENOUS | Status: DC
  Administered 2014-06-25: 1000 mL via INTRAVENOUS

## 2014-06-25 MED ORDER — LIDOCAINE HCL 1 % IJ SOLN
INTRAMUSCULAR | Status: DC | PRN
  Administered 2014-06-25: 30 mg via INTRADERMAL

## 2014-06-25 MED ORDER — KETAMINE HCL 10 MG/ML IJ SOLN
INTRAMUSCULAR | Status: DC | PRN
  Administered 2014-06-25: 10 mg via INTRAVENOUS

## 2014-06-25 MED ORDER — FENTANYL CITRATE 0.05 MG/ML IJ SOLN
INTRAMUSCULAR | Status: AC
  Filled 2014-06-25: qty 2

## 2014-06-25 MED ORDER — MIDAZOLAM HCL 5 MG/5ML IJ SOLN
INTRAMUSCULAR | Status: DC | PRN
  Administered 2014-06-25: 1 mg via INTRAVENOUS
  Administered 2014-06-25: 2 mg via INTRAVENOUS
  Administered 2014-06-25: 1 mg via INTRAVENOUS

## 2014-06-25 MED ORDER — PROPOFOL INFUSION 10 MG/ML OPTIME
INTRAVENOUS | Status: DC | PRN
  Administered 2014-06-25: 140 ug/kg/min via INTRAVENOUS

## 2014-06-25 MED ORDER — LIDOCAINE HCL (CARDIAC) 20 MG/ML IV SOLN
INTRAVENOUS | Status: AC
  Filled 2014-06-25: qty 5

## 2014-06-25 MED ORDER — SODIUM CHLORIDE 0.9 % IV SOLN: INTRAVENOUS | Status: DC

## 2014-06-25 MED ORDER — LACTATED RINGERS IV SOLN
INTRAVENOUS | Status: DC | PRN
  Administered 2014-06-25: 10:00:00 via INTRAVENOUS

## 2014-06-25 MED ORDER — FENTANYL CITRATE 0.05 MG/ML IJ SOLN
INTRAMUSCULAR | Status: DC | PRN
  Administered 2014-06-25: 50 ug via INTRAVENOUS

## 2014-06-25 NOTE — Op Note (Signed)
[facility] [place], [address]   ENDOSCOPIC ULTRASOUND PROCEDURE REPORT  PATIENT: Joel Osborne, Joel Osborne  MR#: [id number] BIRTHDATE: 07-Jan-1966  GENDER: Male ENDOSCOPIST: Milus Banister, MD REFERRED BY:  Lanelle Bal, M.D. PROCEDURE DATE:  06/25/2014 PROCEDURE:   Upper EUS w/FNA ASA CLASS:      Class III INDICATIONS:   1.  GE junction adenocarcinoma, treated with neoadjuvant chemo/XRT and then distal esophagectomy 2014; now with hoarsenss, CT and PET show necrotic LN in right esophageal/tracheal grove in cervical esophagust, just superior to the EG anastomosis. MEDICATIONS: MAC sedation, administered by CRNA  DESCRIPTION OF PROCEDURE:   After the risks benefits and alternatives of the procedure were  explained, informed consent was obtained. The patient was then placed in the left, lateral, decubitus postion and IV sedation was administered. Throughout the procedure, the patients blood pressure, pulse and oxygen saturations were monitored continuously.  Under direct visualization, the Pentax Radial EUS [id number]  endoscope was introduced through the mouth  and advanced to the stomach body . Water was used as necessary to provide an acoustic interface.  Upon completion of the imaging, water was removed and the patient was sent to the recovery room in satisfactory condition.   Endoscopic findings: 1. The EG anastomosis was located at 25cm from the incisors. There was a small anastomtic ulcer at the site.  EUS findings: 1. 1.7cm homogeneous, round paraesophageal lymphnode located 1-2cm proximal to the anastomosis (23cm from incisors). This was sampled with two transesophageal EUS FNA passes with a 25 gauge needle.  Impression: 1.7cm paraesophageal lymphnode at 23cm from incisors, preliminary cytology review is positive for maligancy (adenocarcinoma).  Will communicate these results with Drs. Vonna Kotyk and  [place].   _______________________________ eSigned:  Milus Banister, MD 06/25/2014 11:40 AM

## 2014-06-25 NOTE — Anesthesia Postprocedure Evaluation (Signed)
  Anesthesia Post-op Note  Patient: Joel Osborne  Procedure(s) Performed: Procedure(s) (LRB): UPPER ENDOSCOPIC ULTRASOUND (EUS) LINEAR (N/A)  Patient Location: PACU  Anesthesia Type: MAC  Level of Consciousness: awake and alert   Airway and Oxygen Therapy: Patient Spontanous Breathing  Post-op Pain: mild  Post-op Assessment: Post-op Vital signs reviewed, Patient's Cardiovascular Status Stable, Respiratory Function Stable, Patent Airway and No signs of Nausea or vomiting  Last Vitals:  Filed Vitals:   06/25/14 1201  BP: 109/60  Pulse: 81  Temp:   Resp: 19    Post-op Vital Signs: stable   Complications: No apparent anesthesia complications

## 2014-06-25 NOTE — Interval H&P Note (Signed)
History and Physical Interval Note:  06/25/2014 9:57 AM  Joel Osborne  has presented today for surgery, with the diagnosis of Esophageal cancer [150.9]  The various methods of treatment have been discussed with the patient and family. After consideration of risks, benefits and other options for treatment, the patient has consented to  Procedure(s): UPPER ENDOSCOPIC ULTRASOUND (EUS) LINEAR (N/A) as a surgical intervention .  The patient's history has been reviewed, patient examined, no change in status, stable for surgery.  I have reviewed the patient's chart and labs.  Questions were answered to the patient's satisfaction.     Milus Banister

## 2014-06-25 NOTE — Discharge Instructions (Signed)

## 2014-06-25 NOTE — H&P (View-Only) (Signed)
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 He reports an approximate 3 week history of hoarseness. He was referred to ENT and was found to have vocal cord paralysis. A CT of the neck 06/09/2012 revealed a necrotic lymph node in the right tracheoesophageal groove below the level of the thyroid. He is scheduled to see Dr. Servando Snare later today. He otherwise feels well. He reports his weight has stabilized. No pain. He is working. Objective:  Vital signs in last 24 hours:  Blood pressure 125/67, pulse 73, temperature 98.6 F (37 C), temperature source Oral, resp. rate 18, height 6' (1.829 m), weight 184 lb 1.6 oz (83.507 kg).    HEENT: Oropharynx without visible mass, neck without mass Lymphatics: No cervical, supraclavicular, axillary, or inguinal nodes. Soft mobile fullness in the high right axilla measuring approximately 1 cm without a discrete palpable lymph node Resp: Lungs clear bilaterally Cardio: Regular rate and rhythm GI: No hepatomegaly, nontender, no mass Vascular: No leg edema    Imaging:  Nm Pet Image Restag (ps) Skull Base To Thigh  06/15/2014   CLINICAL DATA:  Subsequent treatment strategy for esophageal cancer.  EXAM: NUCLEAR MEDICINE PET SKULL BASE TO THIGH  TECHNIQUE: 10.4 mCi F-18 FDG was injected intravenously. Full-ring PET imaging was performed from the skull base to thigh after the radiotracer. CT data was obtained and used for attenuation correction and anatomic localization.  FASTING BLOOD GLUCOSE:  Value: 90 a mg/dl  COMPARISON:  CT neck from 06/09/2014.  FINDINGS: NECK  No upper cervical hypermetabolic lymphadenopathy.  CHEST  The necrotic lymph node of concern on the recent CT scan is hypermetabolic on today's PET images (SUV max 4.1). This is located in the tracheoesophageal groove, just cranial to the anastomosis. There is no other hypermetabolic disease  in the chest.  ABDOMEN/PELVIS  No abnormal hypermetabolic activity within the liver, pancreas, adrenal glands, or spleen. No hypermetabolic lymph nodes in the abdomen or pelvis.  Tiny calcified stones are seen in the gallbladder.  SKELETON  No focal hypermetabolic activity to suggest skeletal metastasis.  IMPRESSION: A single focus of abnormal hypermetabolism is identified on this study and correlates to the necrotic lymph node identified in the upper mediastinum on recent neck CT, just cranial to the esophagogastric anastomosis. Imaging features are consistent with metastatic disease.   Electronically Signed   By: [name] M.D.   On: 06/15/2014 14:29    Medications: I have reviewed the patient's current medications.  Assessment/Plan: 1.Adenocarcinoma of the distal esophagus/gastric cardia-status post an endoscopic biopsy on 03/18/2013 confirming adenocarcinoma  -Staging CT scans 03/20/2013 confirmed a distal esophageal/gastric cardia mass, distal paraesophageal lymph nodes and paratracheal nodes concerning for metastatic lymphadenopathy  -A PET scan 03/27/2013 with a multifocal area of hypermetabolic activity involving the thoracic esophagus extending into the gastric cardia and hypermetabolic mediastinal nodes, no distant metastatic disease  -Initiation of radiation 04/07/2013 and weekly Taxol/carboplatin 04/08/2013, radiation completed 05/15/2013  -Restaging PET scan 06/09/2013-no evidence of metastatic disease, no hypermetabolic lymphadenopathy  -Esophagogastrectomy 07/07/2013 revealed a ypT1b,ypN0 tumor with negative surgical margins  -CT neck 06/09/2014 revealed a necrotic lymph node in the right tracheoesophageal groove -PET scan 11/15/2014 revealed a single focus of hypermetabolism corresponding to the necrotic right upper mediastinum lymph node 2. Indeterminate 15 mm right liver lesion on the CT scan 03/20/2013 , MRI of the liver 06/09/2013 confirmed a right liver hemangioma  3.  Anorexia/weight  loss and solid dysphagia secondary to #1 -improved , he continues to have intermittent dysphagia and nausea with certain foods, he has lost weight compared to when he was here in October but reports recent stabilization  4. History of a colon polyp, status post a polypectomy October 2013  5. history of Thrombocytopenia secondary chemotherapy-the carboplatin wase dose reduced with week 5 of chemotherapy chemotherapy  6. 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 He understands the hypermetabolic node is likely related to recurrent esophagus cancer. The only potentially curative therapy would be surgery. He is scheduled to see Dr. Servando Snare later today. Dr. Lisbeth Renshaw does not feel he is a candidate for further radiation. There are multiple systemic treatment options, but these will not be curative.  I will present his case at the GI tumor conference 06/17/2014. We will check a CEA when he returns for an office visit 07/01/2014. I will consider making a referral to the GI oncology service at [facility] pending Dr. Everrett Coombe recommendation and the tumor board discussion.  Betsy Coder, MD  06/16/2014  12:14 PM

## 2014-06-25 NOTE — Transfer of Care (Signed)
Immediate Anesthesia Transfer of Care Note  Patient: Joel Osborne  Procedure(s) Performed: Procedure(s) (LRB): UPPER ENDOSCOPIC ULTRASOUND (EUS) LINEAR (N/A)  Patient Location: PACU  Anesthesia Type: MAC  Level of Consciousness: sedated, patient cooperative and responds to stimulation  Airway & Oxygen Therapy: Patient Spontanous Breathing and Patient connected to face mask oxgen  Post-op Assessment: Report given to PACU RN and Post -op Vital signs reviewed and stable  Post vital signs: Reviewed and stable  Complications: No apparent anesthesia complications

## 2014-06-25 NOTE — Anesthesia Preprocedure Evaluation (Signed)
Anesthesia Evaluation  Patient identified by MRN, date of birth, ID band Patient awake  General Assessment Comment:COMPLETE ESOPHAGECTOMY 07/07/2013   Reviewed: Allergy & Precautions, H&P , NPO status , Patient's Chart, lab work & pertinent test results  Airway Mallampati: II TM Distance: >3 FB Neck ROM: Full    Dental no notable dental hx.    Pulmonary neg pulmonary ROS,  breath sounds clear to auscultation  Pulmonary exam normal       Cardiovascular negative cardio ROS  Rhythm:Regular Rate:Normal     Neuro/Psych negative neurological ROS  negative psych ROS   GI/Hepatic negative GI ROS, Neg liver ROS,   Endo/Other  diabetes  Renal/GU negative Renal ROS  negative genitourinary   Musculoskeletal negative musculoskeletal ROS (+)   Abdominal   Peds negative pediatric ROS (+)  Hematology negative hematology ROS (+)   Anesthesia Other Findings   Reproductive/Obstetrics negative OB ROS                           Anesthesia Physical Anesthesia Plan  ASA: III  Anesthesia Plan: MAC   Post-op Pain Management:    Induction: Intravenous  Airway Management Planned: Nasal Cannula  Additional Equipment:   Intra-op Plan:   Post-operative Plan:   Informed Consent: I have reviewed the patients History and Physical, chart, labs and discussed the procedure including the risks, benefits and alternatives for the proposed anesthesia with the patient or authorized representative who has indicated his/her understanding and acceptance.   Dental advisory given  Plan Discussed with: CRNA and Surgeon  Anesthesia Plan Comments:         Anesthesia Quick Evaluation

## 2014-06-26 ENCOUNTER — Encounter (HOSPITAL_COMMUNITY): Payer: Self-pay | Admitting: Gastroenterology

## 2014-06-29 ENCOUNTER — Telehealth: Payer: Self-pay | Admitting: *Deleted

## 2014-06-29 NOTE — Telephone Encounter (Signed)
Pt called requesting information on "Cyberknife" procedure.  "I heard about this from [facility] and I want to discuss with Dr. Benay Spice on Wednesday at my appt"  Pt informed MD will be given request and confirmed appt 07/01/14 @ 12 noon.  Pt verbalized understanding.

## 2014-06-30 ENCOUNTER — Telehealth: Payer: Self-pay | Admitting: Gastroenterology

## 2014-06-30 NOTE — Telephone Encounter (Signed)
See alternate note  

## 2014-07-01 ENCOUNTER — Encounter (HOSPITAL_COMMUNITY): Payer: Self-pay | Admitting: Emergency Medicine

## 2014-07-01 ENCOUNTER — Ambulatory Visit: Payer: Self-pay | Admitting: Radiation Oncology

## 2014-07-01 ENCOUNTER — Other Ambulatory Visit (HOSPITAL_BASED_OUTPATIENT_CLINIC_OR_DEPARTMENT_OTHER): Payer: BC Managed Care – PPO

## 2014-07-01 ENCOUNTER — Ambulatory Visit
Admission: RE | Admit: 2014-07-01 | Discharge: 2014-07-01 | Disposition: A | Discharge status: Home / Self Care | Payer: BC Managed Care – PPO | Source: Ambulatory Visit | Attending: Radiation Oncology | Admitting: Radiation Oncology

## 2014-07-01 ENCOUNTER — Ambulatory Visit (HOSPITAL_BASED_OUTPATIENT_CLINIC_OR_DEPARTMENT_OTHER): Payer: BC Managed Care – PPO | Admitting: Oncology

## 2014-07-01 ENCOUNTER — Telehealth: Payer: Self-pay | Admitting: Oncology

## 2014-07-01 ENCOUNTER — Emergency Department (HOSPITAL_COMMUNITY)
Admission: EM | Admit: 2014-07-01 | Discharge: 2014-07-02 | Disposition: A | Discharge status: Home / Self Care | Payer: BC Managed Care – PPO | Attending: Emergency Medicine | Admitting: Emergency Medicine

## 2014-07-01 ENCOUNTER — Ambulatory Visit: Payer: BC Managed Care – PPO | Admitting: Radiation Oncology

## 2014-07-01 VITALS — BP 120/69 | HR 63 | Temp 98.5°F | Resp 20 | Wt 187.9 lb

## 2014-07-01 VITALS — BP 120/68 | HR 57 | Temp 98.6°F | Resp 18 | Ht 73.0 in | Wt 186.9 lb

## 2014-07-01 DIAGNOSIS — R634 Abnormal weight loss: Secondary | ICD-10-CM

## 2014-07-01 DIAGNOSIS — E119 Type 2 diabetes mellitus without complications: Secondary | ICD-10-CM | POA: Insufficient documentation

## 2014-07-01 DIAGNOSIS — C155 Malignant neoplasm of lower third of esophagus: Secondary | ICD-10-CM

## 2014-07-01 DIAGNOSIS — T18128A Food in esophagus causing other injury, initial encounter: Secondary | ICD-10-CM

## 2014-07-01 DIAGNOSIS — E785 Hyperlipidemia, unspecified: Secondary | ICD-10-CM | POA: Insufficient documentation

## 2014-07-01 DIAGNOSIS — Z9221 Personal history of antineoplastic chemotherapy: Secondary | ICD-10-CM | POA: Insufficient documentation

## 2014-07-01 DIAGNOSIS — K222 Esophageal obstruction: Secondary | ICD-10-CM

## 2014-07-01 DIAGNOSIS — IMO0002 Reserved for concepts with insufficient information to code with codable children: Secondary | ICD-10-CM | POA: Insufficient documentation

## 2014-07-01 DIAGNOSIS — Y929 Unspecified place or not applicable: Secondary | ICD-10-CM | POA: Insufficient documentation

## 2014-07-01 DIAGNOSIS — T18108A Unspecified foreign body in esophagus causing other injury, initial encounter: Secondary | ICD-10-CM | POA: Insufficient documentation

## 2014-07-01 DIAGNOSIS — K219 Gastro-esophageal reflux disease without esophagitis: Secondary | ICD-10-CM | POA: Insufficient documentation

## 2014-07-01 DIAGNOSIS — Z8501 Personal history of malignant neoplasm of esophagus: Secondary | ICD-10-CM | POA: Insufficient documentation

## 2014-07-01 DIAGNOSIS — R1310 Dysphagia, unspecified: Secondary | ICD-10-CM

## 2014-07-01 DIAGNOSIS — Z79899 Other long term (current) drug therapy: Secondary | ICD-10-CM | POA: Insufficient documentation

## 2014-07-01 DIAGNOSIS — C770 Secondary and unspecified malignant neoplasm of lymph nodes of head, face and neck: Secondary | ICD-10-CM

## 2014-07-01 DIAGNOSIS — Z923 Personal history of irradiation: Secondary | ICD-10-CM | POA: Insufficient documentation

## 2014-07-01 DIAGNOSIS — R630 Anorexia: Secondary | ICD-10-CM

## 2014-07-01 NOTE — Telephone Encounter (Signed)
S/W PATIENT WIFE(VOULA) AND GAVE APPT WITH DR. URANIS @ [facility] 07/31 @ 11. [facility] [phone] MEDICAL RECORDS FAXED TO [facility] @ [phone] SCAN REQUESTED AND PATIENT WIFE IS AWARE TO [facility] [place] AT [place]

## 2014-07-01 NOTE — Progress Notes (Signed)
  [facility] OFFICE PROGRESS NOTE   Diagnosis: Esophagus cancer  INTERVAL HISTORY:   He returns as scheduled. He has noticed increased solid dysphagia. He is able to maintain his nutrition.  He was taken to an EUS biopsy of the paraesophageal mass by Dr. Ardis Hughs on 06/25/2014. A 1.7 cm round paraesophageal lymph node was located 1-2 cm proximal to the anastomosis at 23 cm from the incisors. 2 FNA biopsies were performed. The cytology ([id number]) revealed malignant cells consistent with metastatic adenocarcinoma.  Objective:  Vital signs in last 24 hours:  Blood pressure 120/68, pulse 57, temperature 98.6 F (37 C), temperature source Oral, resp. rate 18, height _0  (1.854 m), weight 186 lb 14.4 oz (84.777 kg), SpO2 100.00%.   Physical examination-not performed today Lab Results: CEA pending   Medications: I have reviewed the patient's current medications.  Assessment/Plan: 1.Adenocarcinoma of the distal esophagus/gastric cardia-status post an endoscopic biopsy on 03/18/2013 confirming adenocarcinoma, HER-2/neu not amplified  -Staging CT scans 03/20/2013 confirmed a distal esophageal/gastric cardia mass, distal paraesophageal lymph nodes and paratracheal nodes concerning for metastatic lymphadenopathy  -A PET scan 03/27/2013 with a multifocal area of hypermetabolic activity involving the thoracic esophagus extending into the gastric cardia and hypermetabolic mediastinal nodes, no distant metastatic disease  -Initiation of radiation 04/07/2013 and weekly Taxol/carboplatin 04/08/2013, radiation completed 05/15/2013  -Restaging PET scan 06/09/2013-no evidence of metastatic disease, no hypermetabolic lymphadenopathy  -Esophagogastrectomy 07/07/2013 revealed a ypT1b,ypN0 tumor with negative surgical margins  -CT neck 06/09/2014 revealed a necrotic lymph node in the right tracheoesophageal groove  -PET scan 11/15/2014 revealed a single focus of hypermetabolism corresponding  to the necrotic right upper mediastinum lymph node -EUS biopsy of the paraesophageal lymph node 06/25/2012 confirmed metastatic adenocarcinoma  2. Indeterminate 15 mm right liver lesion on the CT scan 03/20/2013 , MRI of the liver 06/09/2013 confirmed a right liver hemangioma  3. Anorexia/weight loss and solid dysphagia secondary to #1 -improved , he continues to have intermittent dysphagia and nausea with certain foods, he has lost weight compared to when he was here in October but reports recent stabilization  4. History of a colon polyp, status post a polypectomy October 2013  5. history of Thrombocytopenia secondary chemotherapy-the carboplatin wase dose reduced with week 5 of chemotherapy chemotherapy  6. 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 He has been diagnosed with recurrent esophagus cancer involving a paraesophageal lymph node. No clinical or PET scan evidence of additional metastatic disease.  His case was presented at the GI tumor conference 06/17/2014. Dr. Servando Snare does not recommend surgery. Dr. Lisbeth Renshaw indicates the lymph node is within the previous radiation field. He saw Dr. Lisbeth Renshaw earlier today and reports Dr. Lisbeth Renshaw feels radiation to this area would have the potential for significant morbidity.  I discussed systemic therapy with Dr. Verline Lema. Systemic therapy would not be curative and is unlikely to restore vocal cord function.    I will make a referral to the GI oncology service at [facility] for a multidisciplinary opinion. He will return for an office visit here in 2 weeks.  We will consider making an ENT referral for management of the hoarseness depending on the [facility] treatment recommendation.    Betsy Coder, MD  07/01/2014  12:59 PM

## 2014-07-01 NOTE — ED Notes (Signed)
Family at bedside. 

## 2014-07-01 NOTE — Telephone Encounter (Signed)
Pt confirmed labs/ov per 07/22 POF, gave pt AVS and advised on referral apt when I confirm a time...Marland KitchenMarland KitchenKJ

## 2014-07-01 NOTE — Progress Notes (Signed)
Patient denies pain. He has some loss of appetite, fatigue, hoarseness, intolerance to milk products and high sugar foods with cramping. He takes Lactaid when he eats meal with milk products. He reports nausea with "foods not going down".

## 2014-07-01 NOTE — ED Notes (Signed)
Pt states he was eating dinner and got a piece of beef stuck in his throat. Pt is in NAD and is breathing easily. Pt states he feels like something is "stuck in his throat." Pt has hx of esophageal CA and has some dysphagia.

## 2014-07-01 NOTE — ED Notes (Signed)
MD at bedside. 

## 2014-07-01 NOTE — ED Notes (Signed)
Patient states this has occurred before but he was able to cough and clear his throat or drink something to help the food go down. None of this has helped tonight.

## 2014-07-02 ENCOUNTER — Encounter (HOSPITAL_COMMUNITY): Payer: Self-pay

## 2014-07-02 ENCOUNTER — Encounter (HOSPITAL_COMMUNITY)
Admission: EM | Disposition: A | Discharge status: Home / Self Care | Payer: Self-pay | Source: Home / Self Care | Attending: Emergency Medicine

## 2014-07-02 HISTORY — PX: ESOPHAGOGASTRODUODENOSCOPY: SHX5428

## 2014-07-02 LAB — CEA: CEA: 3.4 ng/mL (ref 0.0–5.0)

## 2014-07-02 SURGERY — EGD (ESOPHAGOGASTRODUODENOSCOPY)
Anesthesia: Moderate Sedation

## 2014-07-02 MED ORDER — PANTOPRAZOLE SODIUM 40 MG PO TBEC: 40.0000 mg | DELAYED_RELEASE_TABLET | Freq: Every day | ORAL | Status: DC

## 2014-07-02 MED ORDER — MIDAZOLAM HCL 10 MG/2ML IJ SOLN
INTRAMUSCULAR | Status: AC
  Filled 2014-07-02: qty 4

## 2014-07-02 MED ORDER — FENTANYL CITRATE 0.05 MG/ML IJ SOLN
INTRAMUSCULAR | Status: DC | PRN
  Administered 2014-07-02 (×3): 25 ug via INTRAVENOUS

## 2014-07-02 MED ORDER — FENTANYL CITRATE 0.05 MG/ML IJ SOLN
INTRAMUSCULAR | Status: AC
  Filled 2014-07-02: qty 4

## 2014-07-02 MED ORDER — DIPHENHYDRAMINE HCL 50 MG/ML IJ SOLN
INTRAMUSCULAR | Status: AC
  Filled 2014-07-02: qty 1

## 2014-07-02 MED ORDER — MIDAZOLAM HCL 10 MG/2ML IJ SOLN
INTRAMUSCULAR | Status: DC | PRN
  Administered 2014-07-02: 1 mg via INTRAVENOUS
  Administered 2014-07-02 (×3): 2 mg via INTRAVENOUS

## 2014-07-02 NOTE — Op Note (Addendum)
[facility] [place], [address]   ENDOSCOPY PROCEDURE REPORT  PATIENT: Joel Osborne, Joel Osborne  MR#: [id number] BIRTHDATE: [birth year] , 47  yrs. old GENDER: Male ENDOSCOPIST: Gatha Mayer, MD, [facility] PROCEDURE DATE:  07/02/2014 PROCEDURE:  Esophagoscopy  - removal of food impaction ASA CLASS:     Class II INDICATIONS:  food impaction. MEDICATIONS: Fentanyl 75 mcg IV and Versed 7 mg IV TOPICAL ANESTHETIC: none  DESCRIPTION OF PROCEDURE: After the risks benefits and alternatives of the procedure were thoroughly explained, informed consent was obtained.  The Pentax Gastroscope [id number] endoscope was introduced through the mouth and advanced to the stomach body. Without limitations.  The instrument was slowly withdrawn as the mucosa was fully examined.        ESOPHAGUS: Food impaction at esophagogastric anastomosis.   Removed with Jabier Mutton net.  revealed stricture with ulceration.  STOMACH: Bilious fluid and food in fundus and body.  Retroflexion was not performed.     The scope was then withdrawn from the patient and the procedure completed.  COMPLICATIONS: There were no complications. ENDOSCOPIC IMPRESSION: 1.   Food impaction at esophagogastric anastomosis - 25 cm from incisors 2.   Removed with Jabier Mutton net.  revealed stricture with ulceration. 3.   Bilious fluid and food in fundus and body.  RECOMMENDATIONS: Liquids only this AM, soft foods (Dysphagia III) diet. Consider dilation of the stricture Start pantroprazole 40 mg before breakfast    eSigned:  Gatha Mayer, MD, [facility] 07/02/2014 1:19 AM Revised: 07/02/2014 1:19 AM  KG:YJEHUD Philip Aspen, MD, Scarlette Shorts, MD, Kavin Leech, MD, and The Patient

## 2014-07-02 NOTE — Discharge Instructions (Addendum)
I found steak obstructing the esophagus and removed it. This revealed a stricture or narrow area at the area where the esophagus and stomach meet. Could be from acid reflux - will start pantoprazole to treat that and you will most likely need a dilation of this area. Dr. Rema Jasmine will contact you.  I want you to take liquids only until later today then try soft diet "Dysphagia 3". See accompanying information.   Joel Mayer, MD, [facility]    Dysphagia Level 3 Diet, Mechanically Advanced  The dysphagia level 3 diet includes foods that are soft, moist, and can be chopped into 1-inch chunks. This diet is helpful for people with mild swallowing difficulties. It reduces the risk of food getting caught in the windpipe, trachea, or lungs.   WHAT DO I NEED TO KNOW ABOUT THIS DIET?  You may eat foods that are soft and moist.  If you were on the dysphagia level 1 or level 2 diets, you may eat any of the foods included on those lists.  Avoid foods that are dry, hard, sticky, chewy, coarse, and crunchy. Also avoid large cuts of food.  Take small bites. Each bite should contain 1 inch or less of food.  Thicken liquids if instructed by your health care provider. Follow your health care provider's instructions on how to do this and to what consistency.  See your dietitian or speech language pathologist regularly for help with your dietary changes.  WHAT FOODS CAN I EAT?  Grains  Moist breads without nuts or seeds. Biscuits, muffins, pancakes, and waffles well-moistened with syrup, jelly, margarine, or butter. Smooth cereals with plenty of milk to moisten them.  Vegetables  All cooked, soft vegetables. Shredded lettuce. Tender fried potatoes.  Fruits  All canned and cooked fruits. Soft, peeled fresh fruits, such as peaches, nectarines, kiwis, cantaloupe, honeydew melon, and watermelon without seeds. Soft berries, such as strawberries.  Meat and Other Protein Sources  Moist ground or finely diced or  sliced meats. Solid, tender cuts of meat. Meatloaf. Hamburger with a bun. Sausage patty. Deli thin-sliced lunch meat. Chicken, egg, or tuna salad sandwich. Sloppy joe. Moist fish. Eggs prepared any way. Casseroles with small chunks of meats, ground meats, or tender meats.  Dairy  Cheese spreads without coarse large chunks. Shredded cheese. Cheese slices. Cottage cheese. Milk at the right texture. Smooth frappes. Yogurt without nuts or coconut. Ask your health care provider whether you can have frozen desserts (such as malts or milk shakes) and thin liquids.  Sweets/Desserts  Soft, smooth, moist desserts. Non-chewy, smooth candy. Jam. Jelly. Honey. Preserves. Ask your health care provider whether you can have frozen desserts.  Fats and Oils  Butter. Oils. Margarine. Mayonnaise. Gravy. Spreads.  Other  All seasonings and sweeteners. All sauces without large chunks.  The items listed above may not be a complete list of recommended foods or beverages. Contact your dietitian for more options.    WHAT FOODS ARE NOT RECOMMENDED?  Grains  Coarse or dry cereals. Dry breads. Toast. Crackers. Tough, crusty breads, such French bread and baguettes. Tough, crisp fried potatoes. Potato skins. Dry bread stuffing. Granola. Popcorn. Chips.  Vegetables  All raw vegetables except shredded lettuce. Cooked corn. Rubbery or stiff cooked vegetables. Stringy vegetables, such as celery.  Fruits  Hard fruits that are difficult to chew, such as apples or pears. Stringy, high-pulp fruits, such as pineapple, papaya, or mango. Fruits with tough skins, such as grapes. Coconut. All dried fruits. Fruit leather. Fruit roll-ups. Fruit  snacks.  Meat and Other Protein Sources  Dry or tough meats or poultry. Dry fish. Fish with bones. Peanut butter. All nuts and seeds.  Dairy  Any with nuts, seeds, chocolate chips, dried fruit, coconut, or pineapple.  Sweets/Desserts  Dry cakes. Chewy or dry cookies. Any with nuts, seeds, dry  fruits, coconut, pineapple, or anything dry, sticky, or hard. Chewy caramel. Licorice. Taffy-type candies. Ask your health care provider whether you can have frozen desserts.  Fats and Oils  Any with chunks, nuts, seeds, or pineapple. Olives. Angie Fava.  Other  Soups with tough or large chunks of meats, poultry, or vegetables. Corn or clam chowder.  The items listed above may not be a complete list of foods and beverages to avoid. Contact your dietitian for more information.  Document Released: 11/27/2005 Document Revised: 07/30/2013 Document Reviewed: 11/10/2013  [facility] Patient Information 2015 [place], [place]. This information is not intended to replace advice given to you by your health care provider. Make sure you discuss any questions you have with your health care provider.      YOU HAD AN ENDOSCOPIC PROCEDURE TODAY: Refer to the procedure report and other information in the discharge instructions given to you for any specific questions about what was found during the examination. If this information does not answer your questions, please call Dr. Celesta Aver office at [phone] to clarify.   YOU SHOULD EXPECT: Some feelings of bloating in the abdomen. Passage of more gas than usual. Walking can help get rid of the air that was put into your GI tract during the procedure and reduce the bloating. If you had a lower endoscopy (such as a colonoscopy or flexible sigmoidoscopy) you may notice spotting of blood in your stool or on the toilet paper. Some abdominal soreness may be present for a day or two, also.  DIET: Your first meal following the procedure should be a light meal and then it is ok to progress to your normal diet. A half-sandwich or bowl of soup is an example of a good first meal. Heavy or fried foods are harder to digest and may make you feel nauseous or bloated. Drink plenty of fluids but you should avoid alcoholic beverages for 24 hours.   ACTIVITY: Your care partner should take  you home directly after the procedure. You should plan to take it easy, moving slowly for the rest of the day. You can resume normal activity the day after the procedure however YOU SHOULD NOT DRIVE, use power tools, machinery or perform tasks that involve climbing or major physical exertion for 24 hours (because of the sedation medicines used during the test).   SYMPTOMS TO REPORT IMMEDIATELY: A gastroenterologist can be reached at any hour. Please call [phone]  for any of the following symptoms:   Following upper endoscopy (EGD, EUS, ERCP, esophageal dilation) Vomiting of blood or coffee ground material  New, significant abdominal pain  New, significant chest pain or pain under the shoulder blades  Painful or persistently difficult swallowing  New shortness of breath  Black, tarry-looking or red, bloody stools

## 2014-07-02 NOTE — H&P (Signed)
[facility] History and Physical   Primary Care Physician:  [name], MD   Reason for Procedure:  Food impaction   Plan:    EGD and removal of food impaction     HPI: Joel Osborne is a 48 y.o. 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 He ate steak yesterday evening and has been unable to swallow saliva since.   Past Medical History  Diagnosis Date  . Hyperlipemia   . Colon polyp   . Pre-diabetes   . GERD (gastroesophageal reflux disease)   . Diabetes mellitus without complication   . 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 History of radiation therapy 04/07/13-05-15-13    Esopageal ca,50.4Gy/71fx    Past Surgical History  Procedure Laterality Date  . Appendectomy    . Lipoma excision    . Edg  03/18/2013    Esophageal Mass  . Complete esophagectomy N/A 07/07/2013    Procedure: ESOPHAGECTOMY COMPLETE;  Surgeon: Grace Isaac, MD;  Location: [place];  Service: Thoracic;  Laterality: N/A;  transhiatel esophagectomy, feeding jejunostomy  . Video bronchoscopy N/A 07/07/2013    Procedure: VIDEO BRONCHOSCOPY;  Surgeon: Grace Isaac, MD;  Location: [place];  Service: Thoracic;  Laterality: N/A;  . Eus N/A 06/25/2014    Procedure: UPPER ENDOSCOPIC ULTRASOUND (EUS) LINEAR;  Surgeon: Milus Banister, MD;  Location: [facility];  Service: Endoscopy;  Laterality: N/A;    Prior to Admission medications   Medication Sig Start Date End Date Taking? Authorizing Provider  acetaminophen (TYLENOL) 500 MG tablet Take 1,000 mg by mouth every 6 (six) hours as needed for mild pain or moderate pain.   Yes Historical Provider, MD  metFORMIN (GLUCOPHAGE) 500 MG tablet Take by mouth daily after supper.   Yes Historical Provider, MD  Multiple Vitamin (MULTIVITAMIN WITH MINERALS) TABS tablet Take 1 tablet by mouth daily.   Yes Historical Provider, MD  lactase (LACTAID) 3000 UNITS tablet Take 3,000 Units by mouth  daily as needed. For lactose intolerance    Historical Provider, MD    No current facility-administered medications for this encounter.   Current Outpatient Prescriptions  Medication Sig Dispense Refill  . acetaminophen (TYLENOL) 500 MG tablet Take 1,000 mg by mouth every 6 (six) hours as needed for mild pain or moderate pain.      . metFORMIN (GLUCOPHAGE) 500 MG tablet Take by mouth daily after supper.      . Multiple Vitamin (MULTIVITAMIN WITH MINERALS) TABS tablet Take 1 tablet by mouth daily.      Marland Kitchen lactase (LACTAID) 3000 UNITS tablet Take 3,000 Units by mouth daily as needed. For lactose intolerance        Allergies as of 07/01/2014  . (No Known Allergies)    Family History  Problem Relation Age of Onset  . Colon cancer Mother     dx in her 14s  . Diabetes Mother   . Breast cancer Maternal Aunt 81  . Diabetes Maternal Aunt   . Esophageal cancer Maternal Aunt 78  . Kidney failure Brother 36    History   Social History  . Marital Status: Married         Number of Children: 2       Occupational History  . Chiropractor    Social History Main Topics  . Smoking status: Never Smoker   . Smokeless tobacco: Never Used  . Alcohol Use: 1.2 oz/week    2 Cans of  beer per week  . Drug Use: No     Review of Systems: Positive for hoarseness All other review of systems negative except as mentioned in the HPI.  Physical Exam: Vital signs in last 24 hours: Temp:  [98.1 F (36.7 C)-98.6 F (37 C)] 98.1 F (36.7 C) (07/22 2220) Pulse Rate:  [57-90] 90 (07/22 2220) Resp:  [18-20] 18 (07/22 2220) BP: (120-123)/(68-80) 123/80 mmHg (07/22 2220) SpO2:  [98 %-100 %] 98 % (07/22 2220) Weight:  [186 lb (84.369 kg)-187 lb 14.4 oz (85.231 kg)] 186 lb (84.369 kg) (07/22 2220)   General:   Alert,  Well-developed, well-nourished, pleasant and cooperative in NAD Lungs:  Clear throughout to auscultation.   Heart:  Regular rate and rhythm; no murmurs, clicks, rubs,  or  gallops. Abdomen:  Soft, nontender and nondistended. Normal bowel sounds.   Neuro/Psych:  Alert and cooperative. Normal mood and affect. A and O x 3  He is hoarse, left neck scar, pharynx clear   @Marylon Verno  Simonne Maffucci, MD, [facility] [phone] (pager) 07/02/2014 12:39 AM@

## 2014-07-02 NOTE — ED Provider Notes (Signed)
CSN: [id number]     Arrival date & time 07/01/14  2216 History   First MD Initiated Contact with Patient 07/01/14 2308     Chief Complaint  Patient presents with  . Dysphagia     (Consider location/radiation/quality/duration/timing/severity/associated sxs/prior Treatment) HPI 48 year old male presents to emergency department with complaint of dysphasia.  Patient reports after eating steak tonight he felt a piece lodged in his esophagus.  He has had similar problems in the past, but usually is able to burp up or swallow pass the obstruction.  Patient has history of esophageal cancer status post esophagectomy and radiation.  Patient has been unable to swallow his own secretions.  He has pain at his sternal notch. Past Medical History  Diagnosis Date  . Hyperlipemia   . Colon polyp   . Pre-diabetes   . GERD (gastroesophageal reflux disease)   . Diabetes mellitus without complication   . 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 History of radiation therapy 04/07/13-05-15-13    Esopageal ca,50.4Gy/59fx   Past Surgical History  Procedure Laterality Date  . Appendectomy    . Lipoma excision    . Edg  03/18/2013    Esophageal Mass  . Complete esophagectomy N/A 07/07/2013    Procedure: ESOPHAGECTOMY COMPLETE;  Surgeon: Grace Isaac, MD;  Location: [place];  Service: Thoracic;  Laterality: N/A;  transhiatel esophagectomy, feeding jejunostomy  . Video bronchoscopy N/A 07/07/2013    Procedure: VIDEO BRONCHOSCOPY;  Surgeon: Grace Isaac, MD;  Location: [place];  Service: Thoracic;  Laterality: N/A;  . Eus N/A 06/25/2014    Procedure: UPPER ENDOSCOPIC ULTRASOUND (EUS) LINEAR;  Surgeon: Milus Banister, MD;  Location[facility];  Service: Endoscopy;  Laterality: N/A;   Family History  Problem Relation Age of Onset  . Colon cancer Mother     dx in her 18s  . Diabetes Mother   . Breast cancer Maternal Aunt 81  . Diabetes Maternal Aunt   . Esophageal cancer Maternal Aunt 78  .  Kidney failure Brother 77   History  Substance Use Topics  . Smoking status: Never Smoker   . Smokeless tobacco: Never Used  . Alcohol Use: 1.2 oz/week    2 Cans of beer per week    Review of Systems   See History of Present Illness; otherwise all other systems are reviewed and negative  Allergies  Review of patient's allergies indicates no known allergies.  Home Medications   Prior to Admission medications   Medication Sig Start Date End Date Taking? Authorizing Provider  acetaminophen (TYLENOL) 500 MG tablet Take 1,000 mg by mouth every 6 (six) hours as needed for mild pain or moderate pain.   Yes Historical Provider, MD  metFORMIN (GLUCOPHAGE) 500 MG tablet Take by mouth daily after supper.   Yes Historical Provider, MD  Multiple Vitamin (MULTIVITAMIN WITH MINERALS) TABS tablet Take 1 tablet by mouth daily.   Yes Historical Provider, MD  lactase (LACTAID) 3000 UNITS tablet Take 3,000 Units by mouth daily as needed. For lactose intolerance    Historical Provider, MD   BP 123/80  Pulse 90  Temp(Src) 98.1 F (36.7 C) (Oral)  Resp 18  Ht 6' (1.829 m)  Wt 186 lb (84.369 kg)  BMI 25.22 kg/m2  SpO2 98% Physical Exam  Nursing note and vitals reviewed. Constitutional: He is oriented to person, place, and time. He appears well-developed and well-nourished. He appears distressed (uncomfortable appearing).  HENT:  Head: Normocephalic and atraumatic.  Nose:  Nose normal.  Mouth/Throat: Oropharynx is clear and moist.  Eyes: Conjunctivae and EOM are normal. Pupils are equal, round, and reactive to light.  Neck: Normal range of motion. Neck supple. No JVD present. No tracheal deviation present. No thyromegaly present.  Cardiovascular: Normal rate, regular rhythm, normal heart sounds and intact distal pulses.  Exam reveals no gallop and no friction rub.   No murmur heard. Pulmonary/Chest: Effort normal and breath sounds normal. No stridor. No respiratory distress. He has no wheezes. He  has no rales. He exhibits no tenderness.  Abdominal: Soft. Bowel sounds are normal. He exhibits no distension and no mass. There is no tenderness. There is no rebound and no guarding.  Musculoskeletal: Normal range of motion. He exhibits no edema and no tenderness.  Lymphadenopathy:    He has no cervical adenopathy.  Neurological: He is alert and oriented to person, place, and time. He exhibits normal muscle tone. Coordination normal.  Skin: Skin is warm and dry. No rash noted. No erythema. No pallor.  Psychiatric: He has a normal mood and affect. His behavior is normal. Judgment and thought content normal.    ED Course  Procedures (including critical care time) Labs Review Labs Reviewed - No data to display  Imaging Review No results found.   EKG Interpretation None      MDM   Final diagnoses:  Esophageal obstruction due to food impaction    48 year old male history of esophageal cancer status post esophagectomy with probable stricture and food bolus obstruction.  Patient unable to sip a small amount of soda.  Discuss with Dr. Carlean Purl on-call for [facility].  He was a patient in the emergency department and plans for scope.   Kalman Drape, MD 07/02/14 [phone]

## 2014-07-03 ENCOUNTER — Telehealth: Payer: Self-pay | Admitting: *Deleted

## 2014-07-03 ENCOUNTER — Encounter (HOSPITAL_COMMUNITY): Payer: Self-pay | Admitting: Internal Medicine

## 2014-07-03 NOTE — Telephone Encounter (Signed)
Message copied by Domenic Schwab on Fri Jul 03, 2014 11:34 AM ------      Message from: Betsy Coder B      Created: Thu Jul 02, 2014  7:17 AM       Please call patient, cea is normal ------

## 2014-07-03 NOTE — Telephone Encounter (Signed)
Per Dr. Benay Spice; notified pt that cea is normal.  Pt verbalized understanding and wants Dr. Benay Spice to know "after appt on 7/22, I ate steak that night and had to go to the ED to get it taken out and the doctor suggest I should get my esophagus stretched"  Informed pt Dr. Benay Spice will be made aware.

## 2014-07-05 NOTE — Progress Notes (Signed)
Radiation Oncology         [phone] ________________________________  Name: Joel Osborne MRN: [id number]  Date: 07/01/2014  DOB: Dec 03, 1966  Follow-Up Visit Note  CC: [name], MD  Ladell Pier, MD  Diagnosis:   Adenocarcinoma of the distal esophagus  Interval Since Last Radiation:  The patient completed radiation treatment on 05/15/2013   Narrative:  The patient returns today for routine follow-up.  The patient has undergone recent imaging due to a worrisome paraesophageal lymph node. Patient had a CT scan of the neck completed on 06/09/2013. Right vocal cord paralysis was seen. Also a necrotic lymph node measuring 13 mm was seen on the right consistent with a paraesophageal metastatic lymph node. PET scan on 06/15/2014 showed abnormal hypermetabolic activity associated with this, with a maximum SUV of 4.40. The patient underwent upper endoscopy with a fine needle aspiration. A 1.7 cm homogeneous lymph node was seen. This was biopsied and has returned positive for adenocarcinoma expected. This is consistent with recurrent esophageal cancer.        The patient at this time describes an approximate 5 week history of hoarseness. He does describe some food getting stuck now in the esophagus. He denies any pain or esophagitis/burning. He denies any shortness of breath and has no other new complaints today.                        ALLERGIES:  has No Known Allergies.  Meds: Current Outpatient Prescriptions  Medication Sig Dispense Refill  . acetaminophen (TYLENOL) 500 MG tablet Take 1,000 mg by mouth every 6 (six) hours as needed for mild pain or moderate pain.      Marland Kitchen lactase (LACTAID) 3000 UNITS tablet Take 3,000 Units by mouth daily as needed. For lactose intolerance      . metFORMIN (GLUCOPHAGE) 500 MG tablet Take by mouth daily after supper.      . Multiple Vitamin (MULTIVITAMIN WITH MINERALS) TABS tablet Take 1 tablet by mouth daily.      . pantoprazole (PROTONIX) 40 MG  tablet Take 1 tablet (40 mg total) by mouth daily before breakfast.  30 tablet  11   No current facility-administered medications for this encounter.    Physical Findings: The patient is in no acute distress. Patient is alert and oriented.  weight is 187 lb 14.4 oz (85.231 kg). His oral temperature is 98.5 F (36.9 C). His blood pressure is 120/69 and his pulse is 63. His respiration is 20. Marland Kitchen   General: Well-developed, in no acute distress HEENT: Normocephalic, atraumatic Cardiovascular: Regular rate and rhythm Respiratory: Clear to auscultation bilaterally GI: Soft, nontender, normal bowel sounds Extremities: No edema present   Lab Findings: Lab Results  Component Value Date   WBC 9.5 07/18/2013   HGB 12.7* 07/18/2013   HCT 38.1* 07/18/2013   MCV 90.1 07/18/2013   PLT 270 07/18/2013     Radiographic Findings: Ct Soft Tissue Neck W Contrast  06/09/2014   CLINICAL DATA:  Right vocal cord paralysis 2 weeks. History of distal esophageal carcinoma with resection, x-ray therapy, and chemotherapy July 2014  EXAM: CT NECK WITH CONTRAST  TECHNIQUE: Multidetector CT imaging of the neck was performed using the standard protocol following the bolus administration of intravenous contrast.  CONTRAST:  63mL OMNIPAQUE IOHEXOL 300 MG/ML  SOLN  COMPARISON:  PET-CT 06/09/2013  FINDINGS: Right vocal cord is paralyzed with enlargement of the ventricle and piriform sinus on the right. Right vocal cord is  abducted. No vocal cord mass lesion identified.  Necrotic lymph node is present in the right tracheoesophageal groove just below the level of the thyroid. This is located posterior to the right subclavian artery and is in the region of the proximal right recurrent laryngeal nerve. This is felt to be recurrent metastatic disease to the lymph node given the history of esophageal carcinoma. There has been prior esophagectomy with gastric pull-through. No other cervical adenopathy is identified.  Left vocal cord is  normal.  Lung apices are clear. Parotid and submandibular glands are normal. The tongue is normal. Cervical spondylosis at C5-6 and C6-7.  IMPRESSION: Right vocal cord paralysis. Necrotic lymph node measuring 13 mm in the tracheoesophageal groove on the right felt to be metastatic lymph node from prior esophageal carcinoma and causing dysfunction of the right recurrent laryngeal nerve and vocal cord paralysis. This enlarged lymph node was not present on the prior PET-CT of 06/09/2013.   Electronically Signed   By: Franchot Gallo M.D.   On: 06/09/2014 11:42   Nm Pet Image Restag (ps) Skull Base To Thigh  06/15/2014   CLINICAL DATA:  Subsequent treatment strategy for esophageal cancer.  EXAM: NUCLEAR MEDICINE PET SKULL BASE TO THIGH  TECHNIQUE: 10.4 mCi F-18 FDG was injected intravenously. Full-ring PET imaging was performed from the skull base to thigh after the radiotracer. CT data was obtained and used for attenuation correction and anatomic localization.  FASTING BLOOD GLUCOSE:  Value: 90 a mg/dl  COMPARISON:  CT neck from 06/09/2014.  FINDINGS: NECK  No upper cervical hypermetabolic lymphadenopathy.  CHEST  The necrotic lymph node of concern on the recent CT scan is hypermetabolic on today's PET images (SUV max 4.1). This is located in the tracheoesophageal groove, just cranial to the anastomosis. There is no other hypermetabolic disease in the chest.  ABDOMEN/PELVIS  No abnormal hypermetabolic activity within the liver, pancreas, adrenal glands, or spleen. No hypermetabolic lymph nodes in the abdomen or pelvis.  Tiny calcified stones are seen in the gallbladder.  SKELETON  No focal hypermetabolic activity to suggest skeletal metastasis.  IMPRESSION: A single focus of abnormal hypermetabolism is identified on this study and correlates to the necrotic lymph node identified in the upper mediastinum on recent neck CT, just cranial to the esophagogastric anastomosis. Imaging features are consistent with metastatic  disease.   Electronically Signed   By: [name] M.D.   On: 06/15/2014 14:29    Impression:    The patient has a recent positive biopsy indicated if of recurrent esophageal cancer involving a upper mediastinal paraesophageal lymph node. I have reviewed the patient's prior radiation treatment and this area received radiation, thus making this an in-field failure. I discussed this with the patient today. I discussed with the patient in detail the implications of this which makes it more difficult to treat this area with radiation and also would increase the risk of toxicity in the event of treatment. The patient's case was discussed in multidisciplinary GI conference. Dr. Servando Snare indicated that he did not feel that surgical resection was a good option given this scenario. With no other hypermetabolic activity seen in this with a local failure, Dr. Benay Spice likewise does not feel that chemotherapy, at least as monotherapy, represents an attractive option either.  I discussed with the patient therefore what the possibilities are in terms of radiation treatment. If radiation treatment were given, then permanent dictation of the disease in this area would be a goal. Anything short of this I  believe would be an insufficient reason for treatment.  I therefore discussed with the patient a possible course of stereotactic body radiation treatment. I discussed the possibility for local control permanently in this area. We also extensively discussed the possible side effects and risks which I believe are very real and could be severe. The location of the lymph node and the prior treatment places the patient at very high risk for problems in my opinion. This of course needs to be balanced with the possible natural history with no treatment. Severe esophagitis and other local issues such as fistula were discussed. All of the patient's questions were answered today.  Plan:  The patient wishes to consider his options. He  was accompanied by his wife today who was very engaged in conversation. He is going to see Dr. Benay Spice later today. The patient will contact our office to let us know what his final decision is. If we proceeded to stereotactic body radiation treatment then I would anticipate a "SBRT treatment" with approximately 10 fractions.  I spent 30 minutes with the patient today, the majority of which was spent counseling the patient on the diagnosis of cancer and coordinating care.   [name], M.D., Ph.D.

## 2014-07-06 ENCOUNTER — Telehealth: Payer: Self-pay | Admitting: *Deleted

## 2014-07-06 NOTE — Telephone Encounter (Signed)
Left VM asking for specific information regarding the [facility] appointment with Dr. Josefa Half or Reynaldo Minium. Does not know address/contact information or how long the appointment will take and what they will do. Called back and he reports he called [facility] himself today and has appointment on 7/31 at 11 am with Dr. Josefa Half. They are mailing him a packet today. Instructed him that Dr. Servando Snare or his GI physician needs to manage his dysphagia. He agrees, but wants to see what [facility] says first. He is on a mechanical soft diet-able to take liquids well and soft foods-is not getting dehydrated.

## 2014-07-08 ENCOUNTER — Other Ambulatory Visit: Payer: Self-pay | Admitting: Oncology

## 2014-07-13 ENCOUNTER — Telehealth: Payer: Self-pay | Admitting: *Deleted

## 2014-07-13 NOTE — Telephone Encounter (Signed)
Call from pt asking if he needs to keep 8/5 office visit since he has been referred to [facility]. Returned call, visit was to discuss plan, coordinate care. Pt reports he plans to proceed with re-irradiation at [facility]. Requested him to call with treatment dates. Dr. Benay Spice recommends office visit here toward end of treatment. He voiced understanding.

## 2014-07-15 ENCOUNTER — Ambulatory Visit: Payer: BC Managed Care – PPO | Admitting: Oncology

## 2014-07-15 ENCOUNTER — Telehealth: Payer: Self-pay | Admitting: Internal Medicine

## 2014-07-15 NOTE — Telephone Encounter (Signed)
Yes, please send him up for EGD with balloon dilation in the [facility] ASAP. I should have an opening this Friday morning.

## 2014-07-15 NOTE — Telephone Encounter (Signed)
Pt has tumor in ?distal third of esophagus per Dr. Gearldine Shown OV note. Pt states he has been seen at [facility] and will be seen tomorrow and given his schedule when radiation is to start. Pt states he see a Dr. Lucretia Roers. Pt would like to have EGD with dil done prior to starting his radiation to help with dysphagia. Please advise.

## 2014-07-15 NOTE — Telephone Encounter (Signed)
Pt scheduled for EGD with dil in the [facility] 07/17/14@8 :11BZ. Pt to come in tomorrow to sign consent forms. Pt aware of appt. Pt to be NPO after midnight.

## 2014-07-16 ENCOUNTER — Other Ambulatory Visit: Payer: Self-pay

## 2014-07-16 DIAGNOSIS — R1319 Other dysphagia: Secondary | ICD-10-CM

## 2014-07-17 ENCOUNTER — Ambulatory Visit: Payer: BC Managed Care – PPO | Admitting: Oncology

## 2014-07-17 ENCOUNTER — Ambulatory Visit: Payer: BC Managed Care – PPO | Admitting: Internal Medicine

## 2014-07-17 ENCOUNTER — Other Ambulatory Visit: Payer: Self-pay | Admitting: Radiation Oncology

## 2014-07-17 ENCOUNTER — Encounter: Payer: Self-pay | Admitting: Internal Medicine

## 2014-07-17 VITALS — BP 106/70 | HR 50 | Temp 97.6°F | Resp 15 | Ht 72.0 in | Wt 185.0 lb

## 2014-07-17 DIAGNOSIS — R1310 Dysphagia, unspecified: Secondary | ICD-10-CM

## 2014-07-17 DIAGNOSIS — K222 Esophageal obstruction: Secondary | ICD-10-CM

## 2014-07-17 LAB — GLUCOSE, CAPILLARY
Glucose-Capillary: 116 mg/dL — ABNORMAL HIGH (ref 70–99)
Glucose-Capillary: 90 mg/dL (ref 70–99)

## 2014-07-17 MED ORDER — SODIUM CHLORIDE 0.9 % IV SOLN: 500.0000 mL | INTRAVENOUS | Status: DC

## 2014-07-17 NOTE — Progress Notes (Signed)
No complaints noted in the recovery room. Maw   

## 2014-07-17 NOTE — Op Note (Signed)
[facility]  [address] [place], [id number]   ENDOSCOPY PROCEDURE REPORT  PATIENT: Joel Osborne, Joel Osborne  MR#: [id number] BIRTHDATE: 04/22/1966 , 47  yrs. old GENDER: Male ENDOSCOPIST: Eustace Quail, MD REFERRED BY:  .  [facility] PROCEDURE DATE:  07/17/2014 PROCEDURE:  EGD with balloon dilation of esophagus  - 15,16.5,66mm ASA CLASS:     Class III INDICATIONS:  Dysphagia.   Therapeutic procedure.  Recent food impaction requiring endoscopic removal. History of subtotal esophagectomy with anastomotic stricture noted MEDICATIONS: MAC sedation, administered by CRNA and propofol (Diprivan) 230mg  IV TOPICAL ANESTHETIC: none  DESCRIPTION OF PROCEDURE: After the risks benefits and alternatives of the procedure were thoroughly explained, informed consent was obtained.  The LB [id number] endoscope was introduced through the mouth and advanced to the second portion of the duodenum. Without limitations.  The instrument was slowly withdrawn as the mucosa was fully examined.        EXAM:The esophagus revealed our evidence of subtotal esophagectomy with short segment proximal esophagus.  Gastroesophageal anastomosis at approximately 24 cm from the teeth with evidence of anastomotic stricturing.  The gastric mucosa was normal with evidence of gastroparesis.  The duodenum was normal.  Retroflexed views revealed no abnormalities. THERAPY: Sequential TTS balloon was used to dilate the stricture and separate sessions using 15, 16.5, and 18 mm dilations. Moderate resistance and trace heme. Tolerated well.     The scope was then withdrawn from the patient and the procedure completed.  COMPLICATIONS: There were no complications.  ENDOSCOPIC IMPRESSION: 1. 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  Resume prior diet tomorrow. 2.  Please contact the office for persistent or recurrent swallowing difficulties.  REPEAT EXAM:  eSigned:  Eustace Quail, MD 07/17/2014 9:06 AM   CC:The Patient and Kavin Leech, MD

## 2014-07-17 NOTE — Patient Instructions (Signed)
YOU HAD AN ENDOSCOPIC PROCEDURE TODAY AT [facility]: Refer to the procedure report that was given to you for any specific questions about what was found during the examination.  If the procedure report does not answer your questions, please call your gastroenterologist to clarify.  If you requested that your care partner not be given the details of your procedure findings, then the procedure report has been included in a sealed envelope for you to review at your convenience later.  YOU SHOULD EXPECT: Some feelings of bloating in the abdomen. Passage of more gas than usual.  Walking can help get rid of the air that was put into your GI tract during the procedure and reduce the bloating. If you had a lower endoscopy (such as a colonoscopy or flexible sigmoidoscopy) you may notice spotting of blood in your stool or on the toilet paper. If you underwent a bowel prep for your procedure, then you may not have a normal bowel movement for a few days.  DIET:  Drink plenty of fluids but you should avoid alcoholic beverages for 24 hours.  Please follow the dilatation diet the rest of the day.  ACTIVITY: Your care partner should take you home directly after the procedure.  You should plan to take it easy, moving slowly for the rest of the day.  You can resume normal activity the day after the procedure however you should NOT DRIVE or use heavy machinery for 24 hours (because of the sedation medicines used during the test).    SYMPTOMS TO REPORT IMMEDIATELY: A gastroenterologist can be reached at any hour.  During normal business hours, 8:30 AM to 5:00 PM Monday through Friday, call [phone].  After hours and on weekends, please call the GI answering service at [phone] who will take a message and have the physician on call contact you.   Following upper endoscopy (EGD)  Vomiting of blood or coffee ground material  New chest pain or pain under the shoulder blades  Painful or  persistently difficult swallowing  New shortness of breath  Fever of 100F or higher  Black, tarry-looking stools  FOLLOW UP: If any biopsies were taken you will be contacted by phone or by letter within the next 1-3 weeks.  Call your gastroenterologist if you have not heard about the biopsies in 3 weeks.  Our staff will call the home number listed on your records the next business day following your procedure to check on you and address any questions or concerns that you may have at that time regarding the information given to you following your procedure. This is a courtesy call and so if there is no answer at the home number and we have not heard from you through the emergency physician on call, we will assume that you have returned to your regular daily activities without incident.  SIGNATURES/CONFIDENTIALITY: You and/or your care partner have signed paperwork which will be entered into your electronic medical record.  These signatures attest to the fact that that the information above on your After Visit Summary has been reviewed and is understood.  Full responsibility of the confidentiality of this discharge information lies with you and/or your care-partner.   Handout was given to your care partner on a dilatation diet the rest of today. Resume your current medications today. Please call if any questions or concerns.

## 2014-07-17 NOTE — Progress Notes (Signed)
Report to PACU, RN, vss, BBS= Clear.  

## 2014-07-17 NOTE — Progress Notes (Signed)
Called to room to assist during endoscopic procedure.  Patient ID and intended procedure confirmed with present staff. Received instructions for my participation in the procedure from the performing physician.  

## 2014-07-20 ENCOUNTER — Telehealth: Payer: Self-pay | Admitting: *Deleted

## 2014-07-20 NOTE — Telephone Encounter (Signed)
  Follow up Call-  Call back number 07/17/2014 03/18/2013 09/17/2012  Post procedure Call Back phone  # [phone]  Permission to leave phone message Yes Yes Yes     Patient questions:  Do you have a fever, pain , or abdominal swelling? No. Pain Score  0 *  Have you tolerated food without any problems? Yes.    Have you been able to return to your normal activities? Yes.    Do you have any questions about your discharge instructions: Diet   No. Medications  No. Follow up visit  No.  Do you have questions or concerns about your Care? No.  Actions: * If pain score is 4 or above: No action needed, pain <4.

## 2014-08-31 ENCOUNTER — Ambulatory Visit: Payer: BC Managed Care – PPO | Admitting: Internal Medicine

## 2014-09-09 ENCOUNTER — Telehealth: Payer: Self-pay | Admitting: *Deleted

## 2014-09-09 NOTE — Telephone Encounter (Signed)
Completed 10 doses of high dose radiation at [facility]. Pt reports he had surgery done to "push vocal chord to the center." This was done at [facility] them in F/U 10/6. Has PET scheduled 11/6 at [facility]. Thanks Dr. Benay Spice for his concern. Stated he will get in touch with this office after he receives PET report.

## 2014-09-17 ENCOUNTER — Other Ambulatory Visit: Payer: Self-pay | Admitting: *Deleted

## 2014-09-17 NOTE — Progress Notes (Signed)
pof sent to schedulers for appt with dr Benay Spice later in november

## 2014-09-18 ENCOUNTER — Telehealth: Payer: Self-pay | Admitting: Oncology

## 2014-09-18 NOTE — Telephone Encounter (Signed)
s/w relaive re appt for 11/27. scheedule mailed.

## 2014-09-21 ENCOUNTER — Telehealth: Payer: Self-pay | Admitting: Internal Medicine

## 2014-09-21 NOTE — Telephone Encounter (Signed)
Pt had EGD with dil 07/17/14. Pt states that over the weekend he had an incident where he had trouble swallowing shrimp. States he almost got choked. He would like to have another dilation done. Pt just had surgery on his vocal cords 2 weeks ago, had the right vocal cord moved over to the center so it could touch the left one. Surgery was done at [facility]. Please advise.

## 2014-09-22 NOTE — Telephone Encounter (Signed)
If he is cleared by his other physicians (especially what sounds like recent ENT), ok to set up for EGD with dilation in [place]. If no availability this week, could do in hospital next week but only if MAC/propofol available. Thanks

## 2014-09-22 NOTE — Telephone Encounter (Signed)
Spoke with pt and he states he has been cleared. Offered pt an appt for this week but he needed a later appt. Pt scheduled for EGD with dil in the [place] 10/06/14@1 :30pm. Pt to call back to schedule previsit.

## 2014-10-05 ENCOUNTER — Telehealth: Payer: Self-pay | Admitting: Internal Medicine

## 2014-10-05 NOTE — Telephone Encounter (Signed)
Spoke with Dr. Lucita Lora nurse and pt has been cleared from their standpoint for the EGD with dil. Pt aware.

## 2014-10-05 NOTE — Telephone Encounter (Signed)
10 /26/15 @0920  Returned pt's call. Pt wanted to make sure ok for him to have procedure (Endoscopy) tomorrow since he had vocal cord surgery on 08/11/2014.

## 2014-10-06 ENCOUNTER — Ambulatory Visit (AMBULATORY_SURGERY_CENTER): Payer: BC Managed Care – PPO | Admitting: Internal Medicine

## 2014-10-06 ENCOUNTER — Encounter: Payer: Self-pay | Admitting: Internal Medicine

## 2014-10-06 VITALS — BP 111/68 | HR 56 | Temp 97.7°F | Resp 14 | Ht 72.0 in | Wt 180.0 lb

## 2014-10-06 DIAGNOSIS — R1310 Dysphagia, unspecified: Secondary | ICD-10-CM

## 2014-10-06 DIAGNOSIS — R1314 Dysphagia, pharyngoesophageal phase: Secondary | ICD-10-CM

## 2014-10-06 DIAGNOSIS — K222 Esophageal obstruction: Secondary | ICD-10-CM

## 2014-10-06 LAB — GLUCOSE, CAPILLARY
Glucose-Capillary: 98 mg/dL (ref 70–99)
Glucose-Capillary: 83 mg/dL (ref 70–99)

## 2014-10-06 MED ORDER — OMEPRAZOLE 40 MG PO CPDR: 40.0000 mg | DELAYED_RELEASE_CAPSULE | Freq: Every day | ORAL | Status: DC

## 2014-10-06 MED ORDER — SODIUM CHLORIDE 0.9 % IV SOLN: 500.0000 mL | INTRAVENOUS | Status: DC

## 2014-10-06 NOTE — Progress Notes (Signed)
Report to PACU, RN, vss, BBS= Clear.  

## 2014-10-06 NOTE — Patient Instructions (Signed)
YOU HAD AN ENDOSCOPIC PROCEDURE TODAY AT [facility]: Refer to the procedure report that was given to you for any specific questions about what was found during the examination.  If the procedure report does not answer your questions, please call your gastroenterologist to clarify.  If you requested that your care partner not be given the details of your procedure findings, then the procedure report has been included in a sealed envelope for you to review at your convenience later.  YOU SHOULD EXPECT: Some feelings of bloating in the abdomen. Passage of more gas than usual.  Walking can help get rid of the air that was put into your GI tract during the procedure and reduce the bloating. If you had a lower endoscopy (such as a colonoscopy or flexible sigmoidoscopy) you may notice spotting of blood in your stool or on the toilet paper. If you underwent a bowel prep for your procedure, then you may not have a normal bowel movement for a few days.  DIET:  See Handout.  ACTIVITY: Your care partner should take you home directly after the procedure.  You should plan to take it easy, moving slowly for the rest of the day.  You can resume normal activity the day after the procedure however you should NOT DRIVE or use heavy machinery for 24 hours (because of the sedation medicines used during the test).    SYMPTOMS TO REPORT IMMEDIATELY: A gastroenterologist can be reached at any hour.  During normal business hours, 8:30 AM to 5:00 PM Monday through Friday, call [phone].  After hours and on weekends, please call the GI answering service at [phone] who will take a message and have the physician on call contact you.    Following upper endoscopy (EGD)  Vomiting of blood or coffee ground material  New chest pain or pain under the shoulder blades  Painful or persistently difficult swallowing  New shortness of breath  Fever of 100F or higher  Black, tarry-looking stools  FOLLOW  UP: If any biopsies were taken you will be contacted by phone or by letter within the next 1-3 weeks.  Call your gastroenterologist if you have not heard about the biopsies in 3 weeks.  Our staff will call the home number listed on your records the next business day following your procedure to check on you and address any questions or concerns that you may have at that time regarding the information given to you following your procedure. This is a courtesy call and so if there is no answer at the home number and we have not heard from you through the emergency physician on call, we will assume that you have returned to your regular daily activities without incident.  SIGNATURES/CONFIDENTIALITY: You and/or your care partner have signed paperwork which will be entered into your electronic medical record.  These signatures attest to the fact that that the information above on your After Visit Summary has been reviewed and is understood.  Full responsibility of the confidentiality of this discharge information lies with you and/or your care-partner.  Resume medications. Information given on Dilation Diet.

## 2014-10-06 NOTE — Progress Notes (Signed)
Called to room to assist during endoscopic procedure.  Patient ID and intended procedure confirmed with present staff. Received instructions for my participation in the procedure from the performing physician.  

## 2014-10-06 NOTE — Op Note (Signed)
[facility]  [address] [place], [id number]   1ENDOSCOPY PROCEDURE REPORT  PATIENT: Joel Osborne, Joel Osborne  MR#: [id number] BIRTHDATE: 1966-06-19 , 47  yrs. old GENDER: male ENDOSCOPIST: Eustace Quail, MD REFERRED BY:  .Direct Self PROCEDURE DATE:  10/06/2014 PROCEDURE:  EGD w/ balloon dilation   ?" 18,6mm ASA CLASS:     Class III INDICATIONS:  dysphagia and therapeutic procedure.  The patient is status post subtotal esophagectomy for esophageal cancer. Postop anastomotic stricture was last dilated 07/17/2014 to a maximum of 18 mm. Recent transient food impaction MEDICATIONS: Monitored anesthesia care and Propofol 200 mg IV TOPICAL ANESTHETIC: none  DESCRIPTION OF PROCEDURE: After the risks benefits and alternatives of the procedure were thoroughly explained, informed consent was obtained.  The LB [id number] endoscope was introduced through the mouth and advanced to the second portion of the duodenum , Without limitations.  The instrument was slowly withdrawn as the mucosa was fully examined.  EXAM:The esophagogastric anastomosis was located at 24 cm from the teeth.  This was strictured with associated acute ulceration.  The diameter stricture was estimated to be about 14-15 mm.  The postoperative anatomy below this level was unremarkable to the second duodenum.  Retroflexed views revealed no abnormalities. THERAPY: A sequential TTS balloon was used to dilate the stricture. Initial dilation of 18 mm followed by dilation of 19 mm. Moderate resistance. Heme present on second dilator. Tolerated well    The scope was then withdrawn from the patient and the procedure completed.  COMPLICATIONS: There were no immediate complications.  ENDOSCOPIC IMPRESSION: 1. Anastomotic stricture associated with ulceration status post dilation- balloon 18, 19 mm 2. Otherwise normal postoperative anatomy  RECOMMENDATIONS: 1.  Clear liquids until 5 PM, then soft foods  rest of day.  Resume prior diet tomorrow. 2.  Prescribe omeprazole 40 mg once daily; #30; 11 refills  REPEAT EXAM:  eSigned:  Eustace Quail, MD 10/06/2014 1:57 PM    NL:GXQJJH Philip Aspen, MD, Kavin Leech, MD, and The Patient

## 2014-10-07 ENCOUNTER — Telehealth: Payer: Self-pay | Admitting: *Deleted

## 2014-10-07 NOTE — Telephone Encounter (Signed)
  Follow up Call-  Call back number 10/06/2014 07/17/2014 03/18/2013 09/17/2012  Post procedure Call Back phone  # [phone]  Permission to leave phone message Yes Yes Yes Yes     Patient questions:  Do you have a fever, pain , or abdominal swelling? No. Pain Score  0 *  Have you tolerated food without any problems? Yes.    Have you been able to return to your normal activities? Yes.    Do you have any questions about your discharge instructions: Diet   No. Medications  No. Follow up visit  No.  Do you have questions or concerns about your Care? No.  Actions: * If pain score is 4 or above: No action needed, pain <4.

## 2014-10-29 ENCOUNTER — Ambulatory Visit: Payer: BC Managed Care – PPO | Admitting: Cardiothoracic Surgery

## 2014-11-06 ENCOUNTER — Ambulatory Visit: Payer: BC Managed Care – PPO | Admitting: Oncology

## 2014-11-16 ENCOUNTER — Telehealth: Payer: Self-pay | Admitting: Internal Medicine

## 2014-11-16 NOTE — Telephone Encounter (Signed)
Pt states he got a piece of chicken stuck in his throat around 2pm. Pt states he did have some move down and he can swallow water now. Offered pt an appt for tomorrow, states he took another drink of water and the sensation is gone now. Instructed pt to call us back if he had any further problems. Pt verbalized understanding.

## 2014-11-18 ENCOUNTER — Telehealth: Payer: Self-pay | Admitting: Internal Medicine

## 2014-11-18 NOTE — Telephone Encounter (Signed)
Pt having problems swallowing again. Dr. Philip Aspen requests pt be seen asap. Pt scheduled to see Alonza Bogus PA tomorrow at 3pm. Pt aware of appt.

## 2014-11-19 ENCOUNTER — Ambulatory Visit: Payer: BC Managed Care – PPO | Admitting: Gastroenterology

## 2015-02-16 ENCOUNTER — Ambulatory Visit (HOSPITAL_COMMUNITY): Admit: 2015-02-16 | Payer: Self-pay | Admitting: Internal Medicine

## 2015-02-16 ENCOUNTER — Ambulatory Visit (HOSPITAL_COMMUNITY)
Admission: EM | Admit: 2015-02-16 | Discharge: 2015-02-16 | Disposition: A | Discharge status: Home / Self Care | Payer: BLUE CROSS/BLUE SHIELD | Attending: Emergency Medicine | Admitting: Emergency Medicine

## 2015-02-16 ENCOUNTER — Encounter (HOSPITAL_COMMUNITY): Payer: Self-pay | Admitting: *Deleted

## 2015-02-16 ENCOUNTER — Encounter (HOSPITAL_COMMUNITY)
Admission: EM | Disposition: A | Discharge status: Home / Self Care | Payer: Self-pay | Source: Home / Self Care | Attending: Emergency Medicine

## 2015-02-16 DIAGNOSIS — Z9089 Acquired absence of other organs: Secondary | ICD-10-CM | POA: Diagnosis not present

## 2015-02-16 DIAGNOSIS — K209 Esophagitis, unspecified: Secondary | ICD-10-CM | POA: Insufficient documentation

## 2015-02-16 DIAGNOSIS — Z923 Personal history of irradiation: Secondary | ICD-10-CM | POA: Insufficient documentation

## 2015-02-16 DIAGNOSIS — Z931 Gastrostomy status: Secondary | ICD-10-CM | POA: Diagnosis not present

## 2015-02-16 DIAGNOSIS — T18128A Food in esophagus causing other injury, initial encounter: Secondary | ICD-10-CM | POA: Diagnosis present

## 2015-02-16 DIAGNOSIS — Z9221 Personal history of antineoplastic chemotherapy: Secondary | ICD-10-CM | POA: Insufficient documentation

## 2015-02-16 DIAGNOSIS — Y999 Unspecified external cause status: Secondary | ICD-10-CM | POA: Insufficient documentation

## 2015-02-16 DIAGNOSIS — E119 Type 2 diabetes mellitus without complications: Secondary | ICD-10-CM | POA: Insufficient documentation

## 2015-02-16 DIAGNOSIS — Y929 Unspecified place or not applicable: Secondary | ICD-10-CM | POA: Insufficient documentation

## 2015-02-16 DIAGNOSIS — K222 Esophageal obstruction: Secondary | ICD-10-CM | POA: Diagnosis not present

## 2015-02-16 DIAGNOSIS — Z8501 Personal history of malignant neoplasm of esophagus: Secondary | ICD-10-CM | POA: Diagnosis not present

## 2015-02-16 DIAGNOSIS — Z9889 Other specified postprocedural states: Secondary | ICD-10-CM | POA: Diagnosis not present

## 2015-02-16 DIAGNOSIS — Y9389 Activity, other specified: Secondary | ICD-10-CM | POA: Diagnosis not present

## 2015-02-16 DIAGNOSIS — Z934 Other artificial openings of gastrointestinal tract status: Secondary | ICD-10-CM | POA: Diagnosis not present

## 2015-02-16 DIAGNOSIS — Z9049 Acquired absence of other specified parts of digestive tract: Secondary | ICD-10-CM | POA: Insufficient documentation

## 2015-02-16 DIAGNOSIS — K219 Gastro-esophageal reflux disease without esophagitis: Secondary | ICD-10-CM | POA: Diagnosis not present

## 2015-02-16 DIAGNOSIS — X58XXXA Exposure to other specified factors, initial encounter: Secondary | ICD-10-CM | POA: Diagnosis not present

## 2015-02-16 DIAGNOSIS — Z800 Family history of malignant neoplasm of digestive organs: Secondary | ICD-10-CM | POA: Diagnosis not present

## 2015-02-16 DIAGNOSIS — Z79899 Other long term (current) drug therapy: Secondary | ICD-10-CM | POA: Diagnosis not present

## 2015-02-16 HISTORY — PX: ESOPHAGOGASTRODUODENOSCOPY: SHX5428

## 2015-02-16 SURGERY — EGD (ESOPHAGOGASTRODUODENOSCOPY)
Anesthesia: Moderate Sedation

## 2015-02-16 MED ORDER — DIPHENHYDRAMINE HCL 50 MG/ML IJ SOLN
INTRAMUSCULAR | Status: AC
  Filled 2015-02-16: qty 1

## 2015-02-16 MED ORDER — FENTANYL CITRATE 0.05 MG/ML IJ SOLN
INTRAMUSCULAR | Status: AC
  Filled 2015-02-16: qty 2

## 2015-02-16 MED ORDER — FENTANYL CITRATE 0.05 MG/ML IJ SOLN
INTRAMUSCULAR | Status: DC | PRN
  Administered 2015-02-16 (×2): 25 ug via INTRAVENOUS

## 2015-02-16 MED ORDER — MIDAZOLAM HCL 10 MG/2ML IJ SOLN
INTRAMUSCULAR | Status: AC
  Filled 2015-02-16: qty 2

## 2015-02-16 MED ORDER — SODIUM CHLORIDE 0.9 % IV SOLN: INTRAVENOUS | Status: DC

## 2015-02-16 MED ORDER — MIDAZOLAM HCL 10 MG/2ML IJ SOLN
INTRAMUSCULAR | Status: DC | PRN
  Administered 2015-02-16 (×3): 2 mg via INTRAVENOUS

## 2015-02-16 MED ORDER — DIPHENHYDRAMINE HCL 50 MG/ML IJ SOLN
INTRAMUSCULAR | Status: DC | PRN
  Administered 2015-02-16: 25 mg via INTRAVENOUS

## 2015-02-16 NOTE — H&P (Signed)
Reason for Procedure: Food impaction  Plan:EGD and removal of food impaction     HPI: Joel Osborne is a 49 y.o. 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 He ate steak yesterday evening and has been unable to swallow saliva since.   Past Medical History  Diagnosis Date  . Hyperlipemia   . Colon polyp   . Pre-diabetes   . GERD (gastroesophageal reflux disease)   . Diabetes mellitus without complication   . 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 History of radiation therapy 04/07/13-05-15-13    Esopageal ca,50.4Gy/20fx    Past Surgical History  Procedure Laterality Date  . Appendectomy    . Lipoma excision    . Edg  03/18/2013    Esophageal Mass  . Complete esophagectomy N/A 07/07/2013    Procedure: ESOPHAGECTOMY COMPLETE; Surgeon: Grace Isaac, MD; Location: [facility]; Service: Thoracic; Laterality: N/A; transhiatel esophagectomy, feeding jejunostomy  . Video bronchoscopy N/A 07/07/2013    Procedure: VIDEO BRONCHOSCOPY; Surgeon: Grace Isaac, MD; Location: [facility]; Service: Thoracic; Laterality: N/A;  . Eus N/A 06/25/2014    Procedure: UPPER ENDOSCOPIC ULTRASOUND (EUS) LINEAR; Surgeon: Milus Banister, MD; Location: [facility]; Service: Endoscopy; Laterality: N/A;    Prior to Admission medications   Medication Sig Start Date End Date Taking? Authorizing Provider  acetaminophen (TYLENOL) 500 MG tablet Take 1,000 mg by mouth every 6 (six) hours as needed for mild pain or moderate pain.   Yes Historical Provider, MD  metFORMIN (GLUCOPHAGE) 500 MG tablet Take by mouth daily after supper.   Yes Historical Provider, MD  Multiple Vitamin (MULTIVITAMIN WITH MINERALS) TABS tablet Take 1 tablet by mouth daily.   Yes Historical Provider, MD  lactase  (LACTAID) 3000 UNITS tablet Take 3,000 Units by mouth daily as needed. For lactose intolerance    Historical Provider, MD    No current facility-administered medications for this encounter.   Current Outpatient Prescriptions  Medication Sig Dispense Refill  . acetaminophen (TYLENOL) 500 MG tablet Take 1,000 mg by mouth every 6 (six) hours as needed for mild pain or moderate pain.    . metFORMIN (GLUCOPHAGE) 500 MG tablet Take by mouth daily after supper.    . Multiple Vitamin (MULTIVITAMIN WITH MINERALS) TABS tablet Take 1 tablet by mouth daily.    Marland Kitchen lactase (LACTAID) 3000 UNITS tablet Take 3,000 Units by mouth daily as needed. For lactose intolerance      Allergies as of 07/01/2014  . (No Known Allergies)    Family History  Problem Relation Age of Onset  . Colon cancer Mother     dx in her 101s  . Diabetes Mother   . Breast cancer Maternal Aunt 81  . Diabetes Maternal Aunt   . Esophageal cancer Maternal Aunt 78  . Kidney failure Brother 58    History   Social History  . Marital Status: Married         Number of Children: 2       Occupational History  . Chiropractor    Social History Main Topics  . Smoking status: Never Smoker   . Smokeless tobacco: Never Used  . Alcohol Use: 1.2 oz/week    2 Cans of beer per week  . Drug Use: No     Review of Systems: Positive for hoarseness All other review of systems negative except as mentioned in the HPI.  Physical Exam: Vital signs in last 24 hours: Temp: [98.1 F (36.7  C)-98.6 F (37 C)] 98.1 F (36.7 C) (07/22 2220) Pulse Rate: [57-90] 90 (07/22 2220) Resp: [18-20] 18 (07/22 2220) BP: (120-123)/(68-80) 123/80 mmHg (07/22 2220) SpO2: [98 %-100 %] 98 % (07/22 2220) Weight: [186 lb (84.369 kg)-187 lb 14.4 oz (85.231 kg)] 186 lb (84.369 kg) (07/22 2220)   General: Alert,  Well-developed, well-nourished, pleasant and cooperative in NAD Lungs: Clear throughout to auscultation.  Heart: Regular rate and rhythm; no murmurs, clicks, rubs, or gallops. Abdomen: Soft, nontender and nondistended. Normal bowel sounds.  Neuro/Psych: Alert and cooperative. Normal mood and affect. A and O x 3  He is hoarse, left neck scar, pharynx clear

## 2015-02-16 NOTE — ED Notes (Signed)
Pt reports hx of esophageal surgery and esophageal stricture in the past, pt was eating steak this evening when he was unable to pass a piece. Pt is unable to swallow his saliva - speaking complete sentences, no acute distress.

## 2015-02-16 NOTE — Op Note (Signed)
[facility] [place], [address]   ENDOSCOPY PROCEDURE REPORT  PATIENT: Joel Osborne, Joel Osborne  MR#: [id number] BIRTHDATE: [birth year] , 48  yrs. old GENDER: male ENDOSCOPIST: Lafayette Dragon, MD REFERRED BY:  Leanna Battles, M.D. Dr Scarlette Shorts PROCEDURE DATE:  02/16/2015 PROCEDURE:  EGD w/ fb removal ASA CLASS:     Class III INDICATIONS:  food impaction, patient presents 3 hours after eating a large steak unable to swallow saliva.  History of adenocarcinoma of the distal esophagus, status post total esophagectomy and gastrojejunostomy.2014, Anastomotic stricture last time dilated in October 2015. MEDICATIONS: Benadryl 25 mg IV, Fentanyl 50 mcg IV, and Versed 6 mg IV TOPICAL ANESTHETIC: none  DESCRIPTION OF PROCEDURE: After the risks benefits and alternatives of the procedure were thoroughly explained, informed consent was obtained.  The Pentax Gastroscope [id number] endoscope was introduced through the mouth and advanced to the second portion of the duodenum , Without limitations.  The instrument was slowly withdrawn as the mucosa was fully examined.    Esophagus: patient was expectorating his saliva prior tto intubation of the esophagus which was easy. Proximal esophagus was filled with liquid and large piece of food impaction which extended over 4 cm  to  the anastomosis. The  .anastomosis was located at 23 cm from the incisors. The liquid was suctioned out  a and at [place] passed through  the scope and encased the large piece of meat and a slow steady traction was applied to remove  the large piece of meat  Patient was immediately re-endoscoped and  another large piece of meat was removed with the Roth net. This time the esophageal lumen was clear of any food particles At that point the anastomosis was seen fully and showed tintense erythema but no definite ulceration. The stricture was rather mild it allowed the endoscope to pass freely into the  stomach,approximate diameter of 14-15 mm Stomach: anatomy was consistent with a subtotal esophagectomy and esophago- gastrostomy with jejunostomy. Gastric remnant extended from 23-50 cm. There was small amount of food and bile in the stomach. Rest of the exam was unremarkable          The scope was then withdrawn from the patient and the procedure completed.  COMPLICATIONS: There were no immediate complications.  ENDOSCOPIC IMPRESSION: Large meat impaction at the anastomotic stricture of the esophagus at 23 cm FoodImpaction removal with  Jabier Mutton net Mild esophageal anastomotic stricture,  not dilated Mild esophagitis within the stricture subtotal esophagectomy and esophagogastrostomy and gastrojejunostomy for esophageal adenocarcinoma without evidence of recurrent cancer  RECOMMENDATIONS: 1. stay nothing by mouth tonight except for sips of liquids 2. Strict antireflux measures 3. Resume antireflux medications 4. Follow-up with Dr. Henrene Pastor, call office in the morning to set up an appointment  REPEAT EXAM: for EGD with dilatation. 4-6 weeks  eSigned:  Lafayette Dragon, MD 02/16/2015 11:38 PM    CC:  PATIENT NAME:  [name], [name] MR#: [id number]

## 2015-02-16 NOTE — ED Notes (Signed)
MD at bedside. 

## 2015-02-16 NOTE — ED Provider Notes (Signed)
CSN: [id number]     Arrival date & time 02/16/15  2038 History   First MD Initiated Contact with Patient 02/16/15 2106     Chief Complaint  Patient presents with  . Foreign Body     (Consider location/radiation/quality/duration/timing/severity/associated sxs/prior Treatment) Patient is a 49 y.o. male presenting with foreign body. The history is provided by the patient.  Foreign Body Location:  Swallowed Suspected object:  Food Timing:  Constant Progression:  Worsening Chronicity:  Recurrent Ineffective treatments:  None tried Associated symptoms: no cough     Past Medical History  Diagnosis Date  . Hyperlipemia   . Colon polyp   . Pre-diabetes   . GERD (gastroesophageal reflux disease)   . Diabetes mellitus without complication   . 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 History of radiation therapy 04/07/13-05-15-13    Esopageal ca,50.4Gy/75fx   Past Surgical History  Procedure Laterality Date  . Appendectomy    . Lipoma excision    . Edg  03/18/2013    Esophageal Mass  . Complete esophagectomy N/A 07/07/2013    Procedure: ESOPHAGECTOMY COMPLETE;  Surgeon: Grace Isaac, MD;  Location: [facility];  Service: Thoracic;  Laterality: N/A;  transhiatel esophagectomy, feeding jejunostomy  . Video bronchoscopy N/A 07/07/2013    Procedure: VIDEO BRONCHOSCOPY;  Surgeon: Grace Isaac, MD;  Location: [facility];  Service: Thoracic;  Laterality: N/A;  . Eus N/A 06/25/2014    Procedure: UPPER ENDOSCOPIC ULTRASOUND (EUS) LINEAR;  Surgeon: Milus Banister, MD;  Location: [facility];  Service: Endoscopy;  Laterality: N/A;  . Esophagogastroduodenoscopy N/A 07/02/2014    Procedure: ESOPHAGOGASTRODUODENOSCOPY (EGD);  Surgeon: Gatha Mayer, MD;  Location: [facility];  Service: Endoscopy;  Laterality: N/A;  . Vocal chord surgery      pushed right vocal cord more centered- September 2015   Family History  Problem Relation Age of Onset  . Colon cancer Mother     dx in her 52s  .  Diabetes Mother   . Breast cancer Maternal Aunt 81  . Diabetes Maternal Aunt   . Esophageal cancer Maternal Aunt 78  . Kidney failure Brother 79  . Stomach cancer Neg Hx   . Rectal cancer Neg Hx    History  Substance Use Topics  . Smoking status: Never Smoker   . Smokeless tobacco: Never Used  . Alcohol Use: 1.2 oz/week    2 Cans of beer per week    Review of Systems  Constitutional: Negative for fever.  Respiratory: Negative for cough and shortness of breath.   All other systems reviewed and are negative.     Allergies  Review of patient's allergies indicates no known allergies.  Home Medications   Prior to Admission medications   Medication Sig Start Date End Date Taking? Authorizing Provider  ibuprofen (ADVIL,MOTRIN) 200 MG tablet Take 400 mg by mouth every 6 (six) hours as needed for moderate pain (soreness).   Yes Historical Provider, MD  lactase (LACTAID) 3000 UNITS tablet Take 3,000 Units by mouth daily as needed (lactose intolerance). For lactose intolerance   Yes Historical Provider, MD  metFORMIN (GLUCOPHAGE) 500 MG tablet Take 500 mg by mouth daily as needed (blood sugar).    Yes Historical Provider, MD  Multiple Vitamin (MULTIVITAMIN WITH MINERALS) TABS tablet Take 1 tablet by mouth daily.   Yes Historical Provider, MD  acetaminophen (TYLENOL) 500 MG tablet Take 1,000 mg by mouth every 6 (six) hours as needed for mild pain or moderate pain.  Historical Provider, MD  omeprazole (PRILOSEC) 40 MG capsule Take 1 capsule (40 mg total) by mouth daily. Patient not taking: Reported on 02/16/2015 10/06/14   Irene Shipper, MD   BP 111/64 mmHg  Pulse 62  Temp(Src) 98.5 F (36.9 C) (Oral)  Resp 18  Ht 6' (1.829 m)  Wt 195 lb (88.451 kg)  BMI 26.44 kg/m2  SpO2 96% Physical Exam  Constitutional: He is oriented to person, place, and time. He appears well-developed and well-nourished. No distress.  HENT:  Head: Normocephalic and atraumatic.  Mouth/Throat: Oropharynx is  clear and moist. No oropharyngeal exudate.  Eyes: EOM are normal. Pupils are equal, round, and reactive to light.  Neck: Normal range of motion. Neck supple.  Cardiovascular: Normal rate and regular rhythm.  Exam reveals no friction rub.   No murmur heard. Pulmonary/Chest: Effort normal and breath sounds normal. No respiratory distress. He has no wheezes. He has no rales.  Abdominal: Soft. He exhibits no distension. There is no tenderness. There is no rebound.  Musculoskeletal: Normal range of motion. He exhibits no edema.  Neurological: He is alert and oriented to person, place, and time. No cranial nerve deficit. He exhibits normal muscle tone. Coordination normal.  Skin: No rash noted. He is not diaphoretic.  Nursing note and vitals reviewed.   ED Course  Procedures (including critical care time) Labs Review Labs Reviewed - No data to display  Imaging Review No results found.   EKG Interpretation None      MDM   Final diagnoses:  Food impaction of esophagus, initial encounter    49 year old male here with concern for food impaction. History of esophagectomy due to esophageal cancer. He has had strictures and food impactions before. Was eating steak and then felt food gets stuck. He is unable to swallow his saliva. He has a bag that he continually is spitting his saliva into. I spoke with his GI team, Dr. Olevia Perches, who will come in for endoscopy for his food impaction.    Evelina Bucy, MD 02/16/15 2252

## 2015-02-19 ENCOUNTER — Encounter (HOSPITAL_COMMUNITY): Payer: Self-pay | Admitting: Internal Medicine

## 2015-05-04 ENCOUNTER — Telehealth: Payer: Self-pay | Admitting: Internal Medicine

## 2015-05-04 NOTE — Telephone Encounter (Signed)
Ok to schedule direct if no contraindications. Check his meds. Ongoing treatment for cancer? Thanks

## 2015-05-04 NOTE — Telephone Encounter (Signed)
Pt requesting direct egd with dil. Is it ok to schedule him for direct? Please advise.

## 2015-05-05 NOTE — Telephone Encounter (Signed)
Pt states he is finished with the cancer treatments and is doing well. Pt states he is not on blood thinners. Pt scheduled for previsit 05/14/15'@8'$ :30am, EGD scheduled in the [facility] 05/24/15'@10am'$ . Pt aware of appts.

## 2015-05-12 ENCOUNTER — Telehealth: Payer: Self-pay

## 2015-05-12 NOTE — Telephone Encounter (Signed)
Do you want a colonoscopy as well?  Pt is 48.  Thank you, Ebbie Cherry/previsit

## 2015-05-12 NOTE — Telephone Encounter (Signed)
Not at this point. He continues to recover from aggressive upper GI cancer. We'll consider down the road. Thanks

## 2015-05-14 ENCOUNTER — Ambulatory Visit (AMBULATORY_SURGERY_CENTER): Payer: Self-pay

## 2015-05-14 VITALS — Ht 72.0 in | Wt 205.0 lb

## 2015-05-14 DIAGNOSIS — R1314 Dysphagia, pharyngoesophageal phase: Secondary | ICD-10-CM

## 2015-05-14 NOTE — Progress Notes (Signed)
No allergies to eggs or soy No diet/weight loss meds No home oxygen No past problems with anesthesia  Has email  Emmi instructions given for colonoscopy 

## 2015-05-24 ENCOUNTER — Encounter: Payer: BLUE CROSS/BLUE SHIELD | Admitting: Internal Medicine

## 2015-06-08 ENCOUNTER — Encounter: Payer: Self-pay | Admitting: Internal Medicine

## 2015-06-08 ENCOUNTER — Ambulatory Visit (AMBULATORY_SURGERY_CENTER): Payer: BLUE CROSS/BLUE SHIELD | Admitting: Internal Medicine

## 2015-06-08 VITALS — BP 109/66 | HR 68 | Temp 97.3°F | Resp 29 | Ht 72.0 in | Wt 205.0 lb

## 2015-06-08 DIAGNOSIS — R1314 Dysphagia, pharyngoesophageal phase: Secondary | ICD-10-CM | POA: Diagnosis present

## 2015-06-08 DIAGNOSIS — K222 Esophageal obstruction: Secondary | ICD-10-CM

## 2015-06-08 MED ORDER — SODIUM CHLORIDE 0.9 % IV SOLN: 500.0000 mL | INTRAVENOUS | Status: DC

## 2015-06-08 NOTE — Patient Instructions (Signed)
YOU HAD AN ENDOSCOPIC PROCEDURE TODAY AT [facility]:   Refer to the procedure report that was given to you for any specific questions about what was found during the examination.  If the procedure report does not answer your questions, please call your gastroenterologist to clarify.  If you requested that your care partner not be given the details of your procedure findings, then the procedure report has been included in a sealed envelope for you to review at your convenience later.  YOU SHOULD EXPECT: Some feelings of bloating in the abdomen. Passage of more gas than usual.  Walking can help get rid of the air that was put into your GI tract during the procedure and reduce the bloating. If you had a lower endoscopy (such as a colonoscopy or flexible sigmoidoscopy) you may notice spotting of blood in your stool or on the toilet paper. If you underwent a bowel prep for your procedure, you may not have a normal bowel movement for a few days.  Please Note:  You might notice some irritation and congestion in your nose or some drainage.  This is from the oxygen used during your procedure.  There is no need for concern and it should clear up in a day or so.  SYMPTOMS TO REPORT IMMEDIATELY:    Following upper endoscopy (EGD)  Vomiting of blood or coffee ground material  New chest pain or pain under the shoulder blades  Painful or persistently difficult swallowing  New shortness of breath  Fever of 100F or higher  Black, tarry-looking stools  For urgent or emergent issues, a gastroenterologist can be reached at any hour by calling [phone].   DIET: See Dilation diet   ACTIVITY:  You should plan to take it easy for the rest of today and you should NOT DRIVE or use heavy machinery until tomorrow (because of the sedation medicines used during the test).    FOLLOW UP: Our staff will call the number listed on your records the next business day following your procedure to check  on you and address any questions or concerns that you may have regarding the information given to you following your procedure. If we do not reach you, we will leave a message.  However, if you are feeling well and you are not experiencing any problems, there is no need to return our call.  We will assume that you have returned to your regular daily activities without incident.  If any biopsies were taken you will be contacted by phone or by letter within the next 1-3 weeks.  Please call us at [phone] if you have not heard about the biopsies in 3 weeks.    SIGNATURES/CONFIDENTIALITY: You and/or your care partner have signed paperwork which will be entered into your electronic medical record.  These signatures attest to the fact that that the information above on your After Visit Summary has been reviewed and is understood.  Full responsibility of the confidentiality of this discharge information lies with you and/or your care-partner.  Clear liquids until noon, then soft diet for the rest of the day Resume normal diet tomorrow Please continue to chew food very carefully Contact Dr. Henrene Pastor for recurrent swallowing problems

## 2015-06-08 NOTE — Progress Notes (Signed)
Report to PACU, RN, vss, BBS= Clear.  

## 2015-06-08 NOTE — Progress Notes (Signed)
Called to room to assist during endoscopic procedure.  Patient ID and intended procedure confirmed with present staff. Received instructions for my participation in the procedure from the performing physician.  

## 2015-06-08 NOTE — Progress Notes (Signed)
Pt had CEA done at [facility] and pt reports" it was 23 x higher than normal level.  PET scan shows some haziness between the lungs".  I advised the pt to discuss this with Dr. Henrene Pastor in the procedure room.  Pt understands and will do so.  maw

## 2015-06-08 NOTE — Op Note (Signed)
[place]  [address] [place], [address]   ENDOSCOPY PROCEDURE REPORT  PATIENT: Joel Osborne, Joel Osborne  MR#: [id number] BIRTHDATE: [birth year] , 48  yrs. old GENDER: male ENDOSCOPIST: Eustace Quail, MD REFERRED BY:  .Direct Self PROCEDURE DATE:  06/08/2015 PROCEDURE:  EGD w/ balloon dilation  - 18, 19 mm ASA CLASS:     Class III INDICATIONS:  dysphagia. History of esophageal cancer status post esophagectomy with known anastomotic stricture. Had endoscopic removal of food impaction March 2016. Last dilation October 2015. MEDICATIONS: Monitored anesthesia care and Propofol 200 mg IV TOPICAL ANESTHETIC: none  DESCRIPTION OF PROCEDURE: After the risks benefits and alternatives of the procedure were thoroughly explained, informed consent was obtained.  The LB [id number] endoscope was introduced through the mouth and advanced to the second portion of the duodenum , Without limitations.  The instrument was slowly withdrawn as the mucosa was fully examined.    EXAM:There was a short segment of possible esophagus with a gastroesophageal anastomotic stricture at 24 cm.  This measured approximately 14 mm.  The stomach and duodenum were unremarkable postoperatively.  Retroflexion was not performed. THERAPY: A TTS sequential balloon was placed across the anastomotic stricture and dilated to 18 mm. Subsequently 19 mm. Minor local trauma present. Tolerated well   The scope was then withdrawn from the patient and the procedure completed.  COMPLICATIONS: There were no immediate complications.  ENDOSCOPIC IMPRESSION: 1. Gastroesophageal anastomotic stricture status post dilation  RECOMMENDATIONS: 1.  Clear liquids until noon, then soft foods rest of day.  Resume prior diet tomorrow. 2.  Continue current medications 3. Please continue to chew all foods very carefully 4. Contact Dr. Henrene Pastor for recurrent swallowing troubles  REPEAT EXAM:  eSigned:  Eustace Quail, MD 06/08/2015 10:21 AM    CC:The Patient and Leanna Battles, MD

## 2015-06-09 ENCOUNTER — Telehealth: Payer: Self-pay | Admitting: *Deleted

## 2015-06-09 NOTE — Telephone Encounter (Signed)
  Follow up Call-  Call back number 06/08/2015 10/06/2014 07/17/2014 03/18/2013 09/17/2012  Post procedure Call Back phone  # [phone] office [phone]  Permission to leave phone message Yes Yes Yes Yes Yes     Patient questions:  Do you have a fever, pain , or abdominal swelling? No. Pain Score  0 *  Have you tolerated food without any problems? Yes.    Have you been able to return to your normal activities? Yes.    Do you have any questions about your discharge instructions: Diet   No. Medications  No. Follow up visit  No.  Do you have questions or concerns about your Care? No.  Actions: * If pain score is 4 or above: No action needed, pain <4.

## 2015-06-25 ENCOUNTER — Telehealth: Payer: Self-pay

## 2015-06-25 NOTE — Telephone Encounter (Signed)
Per Dr. Sherry Ruffing she does want pt to have colonoscopy. States pt CEA has risen from 50 to 150 and she wants to make sure they are not missing anything. Left message for pt to call back.

## 2015-06-25 NOTE — Telephone Encounter (Signed)
Called [facility] and they will send Dr. Sherry Ruffing a message and ask if pt still needs another colon. Colonoscopy report faxed to [facility] [phone].

## 2015-06-25 NOTE — Telephone Encounter (Signed)
Received call from Rad/Onc MD at [facility], Dr. Sherry Ruffing, regarding this pt. States his CEA keeps rising and they would like the pt to have a colonoscopy. State if there are any questions to call her at [phone]. Please advise if ok for direct or would you like to see him.

## 2015-06-25 NOTE — Telephone Encounter (Signed)
Linda,Please call them and let them know that patient had a colonoscopy less than 3 years ago (you can send them a copy of the report). Still want one??? If so, ok to set up as direct in [place]

## 2015-06-28 ENCOUNTER — Telehealth: Payer: Self-pay | Admitting: *Deleted

## 2015-06-28 NOTE — Telephone Encounter (Signed)
Spoke with pt to schedule Colon in the [facility], first available appt is 08/23/15. Pt quite anxious due to rising CEA level, would like to have colon done sooner. Can pt be done at hospital 07/27/15? Please advise.

## 2015-06-28 NOTE — Telephone Encounter (Signed)
Pt scheduled for previsit tomorrow at 2:30pm. Pts colon scheduled in the [facility] at 9:30am, pt to arrive at 8:30am. Pt aware of appts.

## 2015-06-28 NOTE — Telephone Encounter (Signed)
Put him on for 9:30 AM colonoscopy in the [facility] this Wednesday, July 20. Raoul Pitch that slot. Thanks

## 2015-06-28 NOTE — Telephone Encounter (Signed)
Pt's PV is 2:30 p.m. Tomorrow and his colonoscopy is on 06-30-15.  I called pt to remind him to be on clear liquids all day tomorrow, even before coming in for his PV.  Understanding voiced

## 2015-06-29 ENCOUNTER — Ambulatory Visit (AMBULATORY_SURGERY_CENTER): Payer: Self-pay

## 2015-06-29 VITALS — Ht 72.0 in | Wt 211.6 lb

## 2015-06-29 DIAGNOSIS — Z860100 Personal history of colon polyps, unspecified: Secondary | ICD-10-CM

## 2015-06-29 MED ORDER — SUPREP BOWEL PREP KIT 17.5-3.13-1.6 GM/177ML PO SOLN: 1.0000 | Freq: Once | ORAL | Status: DC

## 2015-06-29 NOTE — Progress Notes (Signed)
No allergies to eggs or soy No home oxygen No diet/weight loss meds No past problems with anesthesia  Does not want emmi video

## 2015-06-30 ENCOUNTER — Telehealth: Payer: Self-pay | Admitting: Internal Medicine

## 2015-06-30 ENCOUNTER — Ambulatory Visit (AMBULATORY_SURGERY_CENTER): Payer: BLUE CROSS/BLUE SHIELD | Admitting: Internal Medicine

## 2015-06-30 ENCOUNTER — Encounter: Payer: Self-pay | Admitting: Internal Medicine

## 2015-06-30 VITALS — BP 95/61 | HR 57 | Temp 96.0°F | Resp 16 | Ht 72.0 in | Wt 211.0 lb

## 2015-06-30 DIAGNOSIS — Z86010 Personal history of colonic polyps: Secondary | ICD-10-CM

## 2015-06-30 DIAGNOSIS — R970 Elevated carcinoembryonic antigen [CEA]: Secondary | ICD-10-CM | POA: Diagnosis not present

## 2015-06-30 MED ORDER — SODIUM CHLORIDE 0.9 % IV SOLN: 500.0000 mL | INTRAVENOUS | Status: DC

## 2015-06-30 NOTE — Patient Instructions (Signed)
YOU HAD AN ENDOSCOPIC PROCEDURE TODAY AT THE [facility]:   Refer to the procedure report that was given to you for any specific questions about what was found during the examination.  If the procedure report does not answer your questions, please call your gastroenterologist to clarify.  If you requested that your care partner not be given the details of your procedure findings, then the procedure report has been included in a sealed envelope for you to review at your convenience later.  YOU SHOULD EXPECT: Some feelings of bloating in the abdomen. Passage of more gas than usual.  Walking can help get rid of the air that was put into your GI tract during the procedure and reduce the bloating. If you had a lower endoscopy (such as a colonoscopy or flexible sigmoidoscopy) you may notice spotting of blood in your stool or on the toilet paper. If you underwent a bowel prep for your procedure, you may not have a normal bowel movement for a few days.  Please Note:  You might notice some irritation and congestion in your nose or some drainage.  This is from the oxygen used during your procedure.  There is no need for concern and it should clear up in a day or so.  SYMPTOMS TO REPORT IMMEDIATELY:   Following lower endoscopy (colonoscopy or flexible sigmoidoscopy):  Excessive amounts of blood in the stool  Significant tenderness or worsening of abdominal pains  Swelling of the abdomen that is new, acute  Fever of 100F or higher   For urgent or emergent issues, a gastroenterologist can be reached at any hour by calling [phone].   DIET: Your first meal following the procedure should be a small meal and then it is ok to progress to your normal diet. Heavy or fried foods are harder to digest and may make you feel nauseous or bloated.  Likewise, meals heavy in dairy and vegetables can increase bloating.  Drink plenty of fluids but you should avoid alcoholic beverages for 24  hours.  ACTIVITY:  You should plan to take it easy for the rest of today and you should NOT DRIVE or use heavy machinery until tomorrow (because of the sedation medicines used during the test).    FOLLOW UP: Our staff will call the number listed on your records the next business day following your procedure to check on you and address any questions or concerns that you may have regarding the information given to you following your procedure. If we do not reach you, we will leave a message.  However, if you are feeling well and you are not experiencing any problems, there is no need to return our call.  We will assume that you have returned to your regular daily activities without incident.  If any biopsies were taken you will be contacted by phone or by letter within the next 1-3 weeks.  Please call us at [phone] if you have not heard about the biopsies in 3 weeks.    SIGNATURES/CONFIDENTIALITY: You and/or your care partner have signed paperwork which will be entered into your electronic medical record.  These signatures attest to the fact that that the information above on your After Visit Summary has been reviewed and is understood.  Full responsibility of the confidentiality of this discharge information lies with you and/or your care-partner. 

## 2015-06-30 NOTE — Progress Notes (Signed)
A/ox3, pleased with MAC, report to RN 

## 2015-06-30 NOTE — Telephone Encounter (Signed)
Spoke with patient and explained that SBCE is the only test to view the small bowel. He is going to have his [facility] doctors call or send records with information for Dr. Henrene Pastor.

## 2015-06-30 NOTE — Op Note (Signed)
[facility]  [address]. [place], [address]   COLONOSCOPY PROCEDURE REPORT  PATIENT: Joel Osborne, Joel Osborne  MR#: [id number] BIRTHDATE: [birth year] , 48  yrs. old GENDER: male ENDOSCOPIST: Eustace Quail, MD REFERRED BY:.Direct Self [facility] PROCEDURE DATE:  06/30/2015 PROCEDURE:   Colonoscopy, diagnostic First Screening Colonoscopy - Avg.  risk and is 50 yrs.  old or older - No.  Prior Negative Screening - Now for repeat screening. N/A  History of Adenoma - Now for follow-up colonoscopy & has been > or = to 3 yrs.  N/A  Polyps removed today? No Recommend repeat exam, <10 yrs? Yes high risk ASA CLASS:   Class III INDICATIONS:Patient with a history of esophageal adenocarcinoma. Recently elevated CEA. Oncology interested in colonoscopy. The patient had his index colonoscopy in 2013. Small tubular adenoma. Mother with colon cancer in 59s. MEDICATIONS: Monitored anesthesia care and Propofol 260 mg IV  DESCRIPTION OF PROCEDURE:   After the risks benefits and alternatives of the procedure were thoroughly explained, informed consent was obtained.  The digital rectal exam revealed no abnormalities of the rectum.   The LB [id number]  endoscope was introduced through the anus and advanced to the cecum, which was identified by both the appendix and ileocecal valve. No adverse events experienced.   The quality of the prep was excellent. (Suprep was used)  The instrument was then slowly withdrawn as the colon was fully examined. Estimated blood loss is zero unless otherwise noted in this procedure report.    COLON FINDINGS: There was mild diverticulosis noted in the sigmoid colon.   The examination was otherwise normal.  Retroflexed views revealed internal hemorrhoids. The time to cecum = 4.7 Withdrawal time = 6.7   The scope was withdrawn and the procedure completed.  COMPLICATIONS: There were no immediate complications.  ENDOSCOPIC IMPRESSION: 1.   Mild  diverticulosis was noted in the sigmoid colon 2.   The examination was otherwise normal  RECOMMENDATIONS: 1. Follow up colonoscopy in 5 years  (family history) 2. Return to the care of [facility] oncology  eSigned:  Eustace Quail, MD 06/30/2015 10:50 AM   cc: The Patient

## 2015-07-01 ENCOUNTER — Telehealth: Payer: Self-pay | Admitting: *Deleted

## 2015-07-01 NOTE — Telephone Encounter (Signed)
  Follow up Call-  Call back number 06/30/2015 06/08/2015 10/06/2014 07/17/2014 03/18/2013  Post procedure Call Back phone  # [phone] office [phone]  Permission to leave phone message Yes Yes Yes Yes Yes     Patient questions:  Do you have a fever, pain , or abdominal swelling? No. Pain Score  0 *  Have you tolerated food without any problems? Yes.    Have you been able to return to your normal activities? Yes.    Do you have any questions about your discharge instructions: Diet   No. Medications  No. Follow up visit  No.  Do you have questions or concerns about your Care? No.  Actions: * If pain score is 4 or above: No action needed, pain <4.

## 2015-07-16 ENCOUNTER — Telehealth: Payer: Self-pay | Admitting: *Deleted

## 2015-07-16 NOTE — Telephone Encounter (Signed)
Called pt with appointment for 8/8 at 0830. He voiced understanding.

## 2015-07-19 ENCOUNTER — Encounter: Payer: Self-pay | Admitting: *Deleted

## 2015-07-19 ENCOUNTER — Other Ambulatory Visit: Payer: Self-pay | Admitting: Radiology

## 2015-07-19 ENCOUNTER — Telehealth: Payer: Self-pay | Admitting: *Deleted

## 2015-07-19 ENCOUNTER — Telehealth: Payer: Self-pay | Admitting: Oncology

## 2015-07-19 ENCOUNTER — Ambulatory Visit (HOSPITAL_BASED_OUTPATIENT_CLINIC_OR_DEPARTMENT_OTHER): Payer: BLUE CROSS/BLUE SHIELD | Admitting: Oncology

## 2015-07-19 VITALS — BP 127/74 | HR 87 | Temp 98.5°F | Resp 17 | Ht 72.0 in | Wt 208.6 lb

## 2015-07-19 DIAGNOSIS — C155 Malignant neoplasm of lower third of esophagus: Secondary | ICD-10-CM | POA: Diagnosis not present

## 2015-07-19 DIAGNOSIS — C770 Secondary and unspecified malignant neoplasm of lymph nodes of head, face and neck: Secondary | ICD-10-CM

## 2015-07-19 DIAGNOSIS — R634 Abnormal weight loss: Secondary | ICD-10-CM | POA: Diagnosis not present

## 2015-07-19 DIAGNOSIS — R1310 Dysphagia, unspecified: Secondary | ICD-10-CM

## 2015-07-19 DIAGNOSIS — R630 Anorexia: Secondary | ICD-10-CM | POA: Diagnosis not present

## 2015-07-19 MED ORDER — PROCHLORPERAZINE MALEATE 10 MG PO TABS: 10.0000 mg | ORAL_TABLET | Freq: Four times a day (QID) | ORAL | Status: DC | PRN

## 2015-07-19 MED ORDER — LIDOCAINE-PRILOCAINE 2.5-2.5 % EX CREA: 1.0000 "application " | TOPICAL_CREAM | CUTANEOUS | Status: DC | PRN

## 2015-07-19 NOTE — Progress Notes (Signed)
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 He was noted to have a single right upper mediastinal node. An EUS biopsy confirmed adenocarcinoma. He was treated with radiation, completed 08/11/2014. He underwent a laryngeal plasty for management of hoarseness on 09/02/2014.  A PET scan 10/17/2015 was negative. A PET scan to 5 2016 was also negative. The CEA was elevated 04/16/2015 and CT scans revealed no evidence of metastatic disease. The CEA continue to rise. A new right paratracheal lymph node was noted on CT 06/25/2015 thought to potentially be reactive.   A repeat PET scan 07/13/2015 revealed hypermetabolic right paratracheal and right hilar adenopathy concerning for metastatic disease.  He saw Joel Osborne 07/16/2015 to discuss treatment options. Further radiation is not recommended. He wants to be treated closer to home. FOLFOX chemotherapy is recommended.   He has a mild burning discomfort at the left low anterior chest secondary to a left diaphragmatic hernia. He undergoes esophagus dilatation procedures with Dr. Henrene Osborne.  Good appetite. He eats frequent small meals. He continues to work. He complains of malaise.   Review of systems: Positives-malaise, mild burning discomfort at the left low anterior chest A complete review of systems was otherwise negative    Objective:  Vital signs in last 24 hours:  Blood pressure 127/74, pulse 87, temperature 98.5 F (36.9 C), temperature source Oral, resp. rate 17, height 6' (1.829 m), weight 208 lb 9.6 oz (94.62 kg), SpO2 97 %.    HEENT: Neck without mass Lymphatics: No cervical, supra-clavicular, axillary, or inguinal nodes Resp: Lungs clear bilaterally Cardio: Regular rate and rhythm GI: No hepatomegaly, nontender, no mass Vascular: No leg edema     Lab Results:    Lab  Results  Component Value Date   CEA 3.4 07/01/2014    Imaging: August 2016 PET images not available today Medications: I have reviewed the patient's current medications.  Assessment/Plan: 1.Adenocarcinoma of the distal esophagus/gastric cardia-status post an endoscopic biopsy on 03/18/2013 confirming adenocarcinoma, HER-2/neu not amplified  -Staging CT scans 03/20/2013 confirmed a distal esophageal/gastric cardia mass, distal paraesophageal lymph nodes and paratracheal nodes concerning for metastatic lymphadenopathy  -A PET scan 03/27/2013 with a multifocal area of hypermetabolic activity involving the thoracic esophagus extending into the gastric cardia and hypermetabolic mediastinal nodes, no distant metastatic disease  -Initiation of radiation 04/07/2013 and weekly Taxol/carboplatin 04/08/2013, radiation completed 05/15/2013  -Restaging PET scan 06/09/2013-no evidence of metastatic disease, no hypermetabolic lymphadenopathy  -Esophagogastrectomy 07/07/2013 revealed a ypT1b,ypN0 tumor with negative surgical margins  -CT neck 06/09/2014 revealed a necrotic lymph node in the right tracheoesophageal groove  -PET scan 11/15/2014 revealed a single focus of hypermetabolism corresponding to the necrotic right upper mediastinum lymph node -EUS biopsy of the paraesophageal lymph node 06/25/2012 confirmed metastatic adenocarcinoma  -Radiation completed 08/11/2014 -Rising CEA May 2016 -PET scan 07/13/2015 with hypermetabolic right paratracheal and right hilar adenopathy   2. Indeterminate 15 mm right liver lesion on the CT scan 03/20/2013 , MRI of the liver 06/09/2013 confirmed a right liver hemangioma  3. Anorexia/weight loss and solid dysphagia secondary to #1 -improved 4. History of a colon polyp, status post a polypectomy October 2013  5. history of Thrombocytopenia secondary chemotherapy-the carboplatin wase dose reduced with week 5 of chemotherapy chemotherapy  6. 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 Esophageal stricture-status post dilation 06/08/2015     Disposition: [name] has recurrent esophagus cancer, now involving mediastinal and hilar lymph nodes. He underwent an extensive staging evaluation at [facility] and there is been no additional evidence of distant metastatic disease. I discussed treatment options with Joel Osborne and his wife. Joel Osborne recommends FOLFOX chemotherapy. He understands this would not be curative. I also explained the small likelihood that he would be a candidate for resection of lymph nodes in the future.  He agrees to proceed with FOLFOX chemotherapy. I reviewed the potential toxicities associated with this regimen including the chance for nausea/vomiting, mucous sides, diarrhea, alopecia, and hematologic toxicity. We discussed the hyperpigmentation, sun sensitivity, and hand/foot syndrome seen with 5-fluorouracil. He discussed the various types of neuropathy associated with oxaliplatin.  He will be referred for placement of a Port-A-Cath with the plan to begin a first cycle of FOLFOX on 08/03/2015. We will check a baseline CEA that day. He will return for an office visit and cycle 2 FOLFOX 08/17/2015. The plan is to complete 5 cycles of FOLFOX prior to a restaging PET evaluation.  We will obtain the recent [facility] CT and PET scan to load into our information system.  Approximate 40 minutes were spent with the patient today. The majority of the time was used for counseling and coordination of care.       Joel Coder, MD  07/19/2015  2:16 PM

## 2015-07-19 NOTE — Telephone Encounter (Signed)
TC from patient stating he needs to have date/time for Portacath insertion changed to another day this week. He cannot make it tomorrow.  It does need to be this week as he will be out of town next week.

## 2015-07-19 NOTE — Telephone Encounter (Signed)
Gave adn printed appt sched and avs for pt for Aug and Sept

## 2015-07-19 NOTE — Progress Notes (Signed)
Oncology Nurse Navigator Documentation  Oncology Nurse Navigator Flowsheets 07/19/2015  Navigator Encounter Type Initial MedOnc  Patient Visit Type Medonc  Treatment Phase Treatment planning  Barriers/Navigation Needs No barriers at this time  Interventions Coordination of Care--PAC, antiemetics  Time Spent with Patient 30  Met with patient and wife during return patient visit. Explained the role of the GI Nurse Navigator and provided contact information.  Merceda Elks, RN, BSN GI Oncology [place]

## 2015-07-19 NOTE — Progress Notes (Signed)
Spoke with patient/wife and gave appointment for his [facility] tomorrow at 11:30/1:30 at [facility]. NPO after 7 am and he needs driver. Understands and agrees.

## 2015-07-20 ENCOUNTER — Other Ambulatory Visit: Payer: Self-pay | Admitting: Oncology

## 2015-07-20 ENCOUNTER — Ambulatory Visit (HOSPITAL_COMMUNITY)
Admission: RE | Admit: 2015-07-20 | Discharge: 2015-07-20 | Disposition: A | Discharge status: Home / Self Care | Payer: BLUE CROSS/BLUE SHIELD | Source: Ambulatory Visit | Attending: Oncology | Admitting: Oncology

## 2015-07-20 ENCOUNTER — Other Ambulatory Visit (HOSPITAL_COMMUNITY): Payer: BLUE CROSS/BLUE SHIELD

## 2015-07-20 ENCOUNTER — Telehealth: Payer: Self-pay | Admitting: *Deleted

## 2015-07-20 ENCOUNTER — Inpatient Hospital Stay
Admission: RE | Admit: 2015-07-20 | Discharge: 2015-07-20 | Disposition: A | Discharge status: Home / Self Care | Payer: Self-pay | Source: Ambulatory Visit | Attending: Oncology | Admitting: Oncology

## 2015-07-20 ENCOUNTER — Encounter (HOSPITAL_COMMUNITY): Payer: Self-pay

## 2015-07-20 DIAGNOSIS — C159 Malignant neoplasm of esophagus, unspecified: Secondary | ICD-10-CM

## 2015-07-20 DIAGNOSIS — C155 Malignant neoplasm of lower third of esophagus: Secondary | ICD-10-CM

## 2015-07-20 LAB — CBC WITH DIFFERENTIAL/PLATELET
Basophils Absolute: 0 10*3/uL (ref 0.0–0.1)
Basophils Relative: 0 % (ref 0–1)
EOS PCT: 7 % — AB (ref 0–5)
Eosinophils Absolute: 0.4 10*3/uL (ref 0.0–0.7)
HCT: 43.4 % (ref 39.0–52.0)
Hemoglobin: 14.3 g/dL (ref 13.0–17.0)
Lymphocytes Relative: 16 % (ref 12–46)
Lymphs Abs: 1 10*3/uL (ref 0.7–4.0)
MCH: 29.2 pg (ref 26.0–34.0)
MCHC: 32.9 g/dL (ref 30.0–36.0)
MCV: 88.8 fL (ref 78.0–100.0)
Monocytes Absolute: 0.7 10*3/uL (ref 0.1–1.0)
Monocytes Relative: 11 % (ref 3–12)
NEUTROS PCT: 66 % (ref 43–77)
Neutro Abs: 4.1 10*3/uL (ref 1.7–7.7)
Platelets: 129 10*3/uL — ABNORMAL LOW (ref 150–400)
RBC: 4.89 MIL/uL (ref 4.22–5.81)
RDW: 13.4 % (ref 11.5–15.5)
WBC: 6.1 10*3/uL (ref 4.0–10.5)

## 2015-07-20 LAB — PROTIME-INR
INR: 1.07 (ref 0.00–1.49)
Prothrombin Time: 14.1 seconds (ref 11.6–15.2)

## 2015-07-20 MED ORDER — CEFAZOLIN SODIUM-DEXTROSE 2-3 GM-% IV SOLR
2.0000 g | Freq: Once | INTRAVENOUS | Status: AC
  Administered 2015-07-20: 2 g via INTRAVENOUS

## 2015-07-20 MED ORDER — FENTANYL CITRATE (PF) 100 MCG/2ML IJ SOLN
INTRAMUSCULAR | Status: AC | PRN
  Administered 2015-07-20 (×2): 25 ug via INTRAVENOUS
  Administered 2015-07-20: 50 ug via INTRAVENOUS

## 2015-07-20 MED ORDER — FENTANYL CITRATE (PF) 100 MCG/2ML IJ SOLN
INTRAMUSCULAR | Status: AC
  Filled 2015-07-20: qty 2

## 2015-07-20 MED ORDER — MIDAZOLAM HCL 2 MG/2ML IJ SOLN
INTRAMUSCULAR | Status: AC | PRN
  Administered 2015-07-20: 0.5 mg via INTRAVENOUS
  Administered 2015-07-20: 1 mg via INTRAVENOUS
  Administered 2015-07-20: 0.5 mg via INTRAVENOUS
  Administered 2015-07-20: 1 mg via INTRAVENOUS

## 2015-07-20 MED ORDER — HEPARIN SOD (PORK) LOCK FLUSH 100 UNIT/ML IV SOLN
INTRAVENOUS | Status: AC | PRN
  Administered 2015-07-20: 500 [IU]

## 2015-07-20 MED ORDER — HEPARIN SOD (PORK) LOCK FLUSH 100 UNIT/ML IV SOLN
INTRAVENOUS | Status: AC
  Filled 2015-07-20: qty 5

## 2015-07-20 MED ORDER — MIDAZOLAM HCL 2 MG/2ML IJ SOLN
INTRAMUSCULAR | Status: AC
Start: 2015-07-20 — End: 2015-07-20
  Filled 2015-07-20: qty 4

## 2015-07-20 MED ORDER — CEFAZOLIN SODIUM-DEXTROSE 2-3 GM-% IV SOLR
INTRAVENOUS | Status: AC
  Filled 2015-07-20: qty 50

## 2015-07-20 MED ORDER — SODIUM CHLORIDE 0.9 % IV SOLN
INTRAVENOUS | Status: DC
  Administered 2015-07-20: 12:00:00 via INTRAVENOUS

## 2015-07-20 MED ORDER — LIDOCAINE HCL 1 % IJ SOLN
INTRAMUSCULAR | Status: AC
  Filled 2015-07-20: qty 20

## 2015-07-20 NOTE — H&P (Signed)
   HPI: Patient with recurrent esophagus cancer who follows with Dr. Benay Spice and is scheduled today for port placement.   The patient has had a H&P performed within the last 30 days, all history, medications, and exam have been reviewed. The patient denies any interval changes since the H&P.  Medications: Prior to Admission medications   Medication Sig Start Date End Date Taking? Authorizing Provider  acetaminophen (TYLENOL) 500 MG tablet Take 1,000 mg by mouth every 6 (six) hours as needed for mild pain or moderate pain.   Yes Historical Provider, MD  eszopiclone (LUNESTA) 1 MG TABS tablet Take 1 mg by mouth at bedtime as needed for sleep (Take 1-2 tablets at bedtime). Take immediately before bedtime   Yes Historical Provider, MD  ibuprofen (ADVIL,MOTRIN) 200 MG tablet Take 400 mg by mouth every 6 (six) hours as needed for moderate pain (soreness).   Yes Historical Provider, MD  lactase (LACTAID) 3000 UNITS tablet Take 3,000 Units by mouth daily as needed (lactose intolerance). For lactose intolerance   Yes Historical Provider, MD  Multiple Vitamin (MULTIVITAMIN WITH MINERALS) TABS tablet Take 1 tablet by mouth daily.   Yes Historical Provider, MD  lidocaine-prilocaine (EMLA) cream Apply 1 application topically as needed. Apply tablespoon to PAC 2 hours prior to stick and cover with plastic wrap 07/19/15   Ladell Pier, MD  prochlorperazine (COMPAZINE) 10 MG tablet Take 1 tablet (10 mg total) by mouth every 6 (six) hours as needed for nausea. 07/19/15   Ladell Pier, MD     Vital Signs: BP 115/74 mmHg  Pulse 71  Temp(Src) 98.4 F (36.9 C) (Oral)  Resp 16  Ht 6' (1.829 m)  Wt 207 lb 12.8 oz (94.257 kg)  BMI 28.18 kg/m2  SpO2 95%  Physical Exam  Constitutional: He is oriented to person, place, and time. No distress.  HENT:  Head: Normocephalic and atraumatic.  Neck: No tracheal deviation present.  Cardiovascular: Normal rate and regular rhythm.  Exam reveals no gallop and no  friction rub.   No murmur heard. Pulmonary/Chest: Effort normal and breath sounds normal. No respiratory distress. He has no wheezes. He has no rales.  Abdominal: Soft. Bowel sounds are normal. He exhibits no distension. There is no tenderness.  Neurological: He is alert and oriented to person, place, and time.  Skin: He is not diaphoretic.    Mallampati Score:  MD Evaluation Airway: WNL Heart: WNL Abdomen: WNL Chest/ Lungs: WNL ASA  Classification: 2 Mallampati/Airway Score: One  Labs:  CBC:  Recent Labs  07/20/15 1220  WBC 6.1  HGB 14.3  HCT 43.4  PLT 129*    COAGS:  Recent Labs  07/20/15 1220  INR 1.07    Assessment/Plan:  Recurrent esophagus cancer Seen by Dr. Benay Spice Scheduled today for image guided port a catheter placement with sedation The patient has been NPO, no blood thinners taken, labs and vitals have been reviewed. Risks and Benefits discussed with the patient including, but not limited to bleeding, infection, pneumothorax, or fibrin sheath development and need for additional procedures. All of the patient's questions were answered, patient is agreeable to proceed. Consent signed and in chart.    SignedHedy Jacob 07/20/2015, 12:54 PM

## 2015-07-20 NOTE — Discharge Instructions (Signed)
Implanted Port Insertion, Care After °Refer to this sheet in the next few weeks. These instructions provide you with information on caring for yourself after your procedure. Your health care provider may also give you more specific instructions. Your treatment has been planned according to current medical practices, but problems sometimes occur. Call your health care provider if you have any problems or questions after your procedure. °WHAT TO EXPECT AFTER THE PROCEDURE °After your procedure, it is typical to have the following:  °· Discomfort at the port insertion site. Ice packs to the area will help. °· Bruising on the skin over the port. This will subside in 3-4 days. °HOME CARE INSTRUCTIONS °· After your port is placed, you will get a manufacturer's information card. The card has information about your port. Keep this card with you at all times.   °· Know what kind of port you have. There are many types of ports available.   °· Wear a medical alert bracelet in case of an emergency. This can help alert health care workers that you have a port.   °· The port can stay in for as long as your health care provider believes it is necessary.   °· A home health care nurse may give medicines and take care of the port.   °· You or a family member can get special training and directions for giving medicine and taking care of the port at home.   °SEEK MEDICAL CARE IF:  °· Your port does not flush or you are unable to get a blood return.   °· You have a fever or chills. °SEEK IMMEDIATE MEDICAL CARE IF: °· You have new fluid or pus coming from your incision.   °· You notice a bad smell coming from your incision site.   °· You have swelling, pain, or more redness at the incision or port site.   °· You have chest pain or shortness of breath. °Document Released: 09/17/2013 Document Revised: 12/02/2013 Document Reviewed: 09/17/2013 °ExitCare® Patient Information ©2015 [employer]. This information is not intended to replace  advice given to you by your health care provider. Make sure you discuss any questions you have with your health care provider. ° ° °Conscious Sedation, Adult, Care After °Refer to this sheet in the next few weeks. These instructions provide you with information on caring for yourself after your procedure. Your health care provider may also give you more specific instructions. Your treatment has been planned according to current medical practices, but problems sometimes occur. Call your health care provider if you have any problems or questions after your procedure. °WHAT TO EXPECT AFTER THE PROCEDURE  °After your procedure: °· You may feel sleepy, clumsy, and have poor balance for several hours. °· Vomiting may occur if you eat too soon after the procedure. °HOME CARE INSTRUCTIONS °· Do not participate in any activities where you could become injured for at least 24 hours. Do not: °¨ Drive. °¨ Swim. °¨ Ride a bicycle. °¨ Operate heavy machinery. °¨ Cook. °¨ Use power tools. °¨ Climb ladders. °¨ Work from a high place. °· Do not make important decisions or sign legal documents until you are improved. °· If you vomit, drink water, juice, or soup when you can drink without vomiting. Make sure you have little or no nausea before eating solid foods. °· Only take over-the-counter or prescription medicines for pain, discomfort, or fever as directed by your health care provider. °· Make sure you and your family fully understand everything about the medicines given to you, including what   side effects may occur. °· You should not drink alcohol, take sleeping pills, or take medicines that cause drowsiness for at least 24 hours. °· If you smoke, do not smoke without supervision. °· If you are feeling better, you may resume normal activities 24 hours after you were sedated. °· Keep all appointments with your health care provider. °SEEK MEDICAL CARE IF: °· Your skin is pale or bluish in color. °· You continue to feel nauseous or  vomit. °· Your pain is getting worse and is not helped by medicine. °· You have bleeding or swelling. °· You are still sleepy or feeling clumsy after 24 hours. °SEEK IMMEDIATE MEDICAL CARE IF: °· You develop a rash. °· You have difficulty breathing. °· You develop any type of allergic problem. °· You have a fever. °MAKE SURE YOU: °· Understand these instructions. °· Will watch your condition. °· Will get help right away if you are not doing well or get worse. °Document Released: 09/17/2013 Document Reviewed: 09/17/2013 °ExitCare® Patient Information ©2015 [employer]. This information is not intended to replace advice given to you by your health care provider. Make sure you discuss any questions you have with your health care provider. °Implanted Port Home Guide °An implanted port is a type of central line that is placed under the skin. Central lines are used to provide IV access when treatment or nutrition needs to be given through a person's veins. Implanted ports are used for long-term IV access. An implanted port may be placed because:  °· You need IV medicine that would be irritating to the small veins in your hands or arms.   °· You need long-term IV medicines, such as antibiotics.   °· You need IV nutrition for a long period.   °· You need frequent blood draws for lab tests.   °· You need dialysis.   °Implanted ports are usually placed in the chest area, but they can also be placed in the upper arm, the abdomen, or the leg. An implanted port has two main parts:  °· Reservoir. The reservoir is round and will appear as a small, raised area under your skin. The reservoir is the part where a needle is inserted to give medicines or draw blood.   °· Catheter. The catheter is a thin, flexible tube that extends from the reservoir. The catheter is placed into a large vein. Medicine that is inserted into the reservoir goes into the catheter and then into the vein.   °HOW WILL I CARE FOR MY INCISION SITE? °Do not get  the incision site wet. Bathe or shower as directed by your health care provider.  °HOW IS MY PORT ACCESSED? °Special steps must be taken to access the port:  °· Before the port is accessed, a numbing cream can be placed on the skin. This helps numb the skin over the port site.   °· Your health care provider uses a sterile technique to access the port. °¨ Your health care provider must put on a mask and sterile gloves. °¨ The skin over your port is cleaned carefully with an antiseptic and allowed to dry. °¨ The port is gently pinched between sterile gloves, and a needle is inserted into the port. °· Only "non-coring" port needles should be used to access the port. Once the port is accessed, a blood return should be checked. This helps ensure that the port is in the vein and is not clogged.   °· If your port needs to remain accessed for a constant infusion, a clear (transparent) bandage   will be placed over the needle site. The bandage and needle will need to be changed every week, or as directed by your health care provider.   °· Keep the bandage covering the needle clean and dry. Do not get it wet. Follow your health care provider's instructions on how to take a shower or bath while the port is accessed.   °· If your port does not need to stay accessed, no bandage is needed over the port.   °WHAT IS FLUSHING? °Flushing helps keep the port from getting clogged. Follow your health care provider's instructions on how and when to flush the port. Ports are usually flushed with saline solution or a medicine called heparin. The need for flushing will depend on how the port is used.  °· If the port is used for intermittent medicines or blood draws, the port will need to be flushed:   °¨ After medicines have been given.   °¨ After blood has been drawn.   °¨ As part of routine maintenance.   °· If a constant infusion is running, the port may not need to be flushed.   °HOW LONG WILL MY PORT STAY IMPLANTED? °The port can stay in  for as long as your health care provider thinks it is needed. When it is time for the port to come out, surgery will be done to remove it. The procedure is similar to the one performed when the port was put in.  °WHEN SHOULD I SEEK IMMEDIATE MEDICAL CARE? °When you have an implanted port, you should seek immediate medical care if:  °· You notice a bad smell coming from the incision site.   °· You have swelling, redness, or drainage at the incision site.   °· You have more swelling or pain at the port site or the surrounding area.   °· You have a fever that is not controlled with medicine. °Document Released: 11/27/2005 Document Revised: 09/17/2013 Document Reviewed: 08/04/2013 °ExitCare® Patient Information ©2015 [employer]. This information is not intended to replace advice given to you by your health care provider. Make sure you discuss any questions you have with your health care provider. ° °

## 2015-07-20 NOTE — Telephone Encounter (Signed)
Per request; notified pt that PET CD received and is being uploaded in our system.  Pt verbalized understanding.

## 2015-07-20 NOTE — Telephone Encounter (Signed)
Pt states a CD of his PET is being sent today. Would like to be notified when we get the CD.

## 2015-07-20 NOTE — Procedures (Signed)
RIJV PAC SVC RA No comp/EBL

## 2015-07-23 ENCOUNTER — Telehealth: Payer: Self-pay | Admitting: *Deleted

## 2015-07-23 ENCOUNTER — Other Ambulatory Visit (HOSPITAL_COMMUNITY): Payer: BLUE CROSS/BLUE SHIELD

## 2015-07-23 NOTE — Telephone Encounter (Signed)
Pt dropped off scans on 5 CDs to be loaded into system. CDs will be taken to radiology.

## 2015-07-24 ENCOUNTER — Inpatient Hospital Stay
Admission: RE | Admit: 2015-07-24 | Discharge: 2015-07-24 | Disposition: A | Discharge status: Home / Self Care | Payer: Self-pay | Source: Ambulatory Visit | Attending: Oncology | Admitting: Oncology

## 2015-07-24 ENCOUNTER — Other Ambulatory Visit: Payer: Self-pay | Admitting: Oncology

## 2015-07-24 DIAGNOSIS — C801 Malignant (primary) neoplasm, unspecified: Secondary | ICD-10-CM

## 2015-07-25 ENCOUNTER — Inpatient Hospital Stay
Admission: RE | Admit: 2015-07-25 | Discharge: 2015-07-25 | Disposition: A | Discharge status: Home / Self Care | Payer: Self-pay | Source: Ambulatory Visit | Attending: Oncology | Admitting: Oncology

## 2015-07-25 ENCOUNTER — Other Ambulatory Visit: Payer: Self-pay | Admitting: Oncology

## 2015-07-25 DIAGNOSIS — C801 Malignant (primary) neoplasm, unspecified: Secondary | ICD-10-CM

## 2015-08-01 ENCOUNTER — Other Ambulatory Visit: Payer: Self-pay | Admitting: Oncology

## 2015-08-03 ENCOUNTER — Other Ambulatory Visit (HOSPITAL_BASED_OUTPATIENT_CLINIC_OR_DEPARTMENT_OTHER): Payer: BLUE CROSS/BLUE SHIELD

## 2015-08-03 ENCOUNTER — Encounter: Payer: Self-pay | Admitting: *Deleted

## 2015-08-03 ENCOUNTER — Ambulatory Visit (HOSPITAL_BASED_OUTPATIENT_CLINIC_OR_DEPARTMENT_OTHER): Payer: BLUE CROSS/BLUE SHIELD

## 2015-08-03 VITALS — BP 111/70 | HR 75 | Temp 98.5°F | Resp 18

## 2015-08-03 DIAGNOSIS — C155 Malignant neoplasm of lower third of esophagus: Secondary | ICD-10-CM

## 2015-08-03 DIAGNOSIS — C770 Secondary and unspecified malignant neoplasm of lymph nodes of head, face and neck: Secondary | ICD-10-CM | POA: Diagnosis not present

## 2015-08-03 DIAGNOSIS — Z5111 Encounter for antineoplastic chemotherapy: Secondary | ICD-10-CM

## 2015-08-03 LAB — COMPREHENSIVE METABOLIC PANEL (CC13)
ALT: 15 U/L (ref 0–55)
ANION GAP: 10 meq/L (ref 3–11)
AST: 14 U/L (ref 5–34)
Albumin: 4 g/dL (ref 3.5–5.0)
Alkaline Phosphatase: 60 U/L (ref 40–150)
BUN: 12.3 mg/dL (ref 7.0–26.0)
CHLORIDE: 111 meq/L — AB (ref 98–109)
CO2: 25 meq/L (ref 22–29)
CREATININE: 0.9 mg/dL (ref 0.7–1.3)
Calcium: 9.6 mg/dL (ref 8.4–10.4)
EGFR: >90 mL/min/{1.73_m2} (ref >90)
GLUCOSE: 89 mg/dL (ref 70–140)
Potassium: 4.5 mEq/L (ref 3.5–5.1)
SODIUM: 145 meq/L (ref 136–145)
TOTAL PROTEIN: 7.1 g/dL (ref 6.4–8.3)
Total Bilirubin: 0.45 mg/dL (ref 0.20–1.20)

## 2015-08-03 LAB — CBC WITH DIFFERENTIAL/PLATELET
BASO%: 0.9 % (ref 0.0–2.0)
Basophils Absolute: 0.1 10*3/uL (ref 0.0–0.1)
EOS%: 5.8 % (ref 0.0–7.0)
Eosinophils Absolute: 0.4 10*3/uL (ref 0.0–0.5)
HCT: 45 % (ref 38.4–49.9)
HGB: 15.1 g/dL (ref 13.0–17.1)
LYMPH%: 11 % — AB (ref 14.0–49.0)
MCH: 29.9 pg (ref 27.2–33.4)
MCHC: 33.5 g/dL (ref 32.0–36.0)
MCV: 89.2 fL (ref 79.3–98.0)
MONO#: 0.5 10*3/uL (ref 0.1–0.9)
MONO%: 8.3 % (ref 0.0–14.0)
NEUT%: 74 % (ref 39.0–75.0)
NEUTROS ABS: 4.6 10*3/uL (ref 1.5–6.5)
Platelets: 154 10*3/uL (ref 140–400)
RBC: 5.05 10*6/uL (ref 4.20–5.82)
RDW: 13.7 % (ref 11.0–14.6)
WBC: 6.2 10*3/uL (ref 4.0–10.3)
lymph#: 0.7 10*3/uL — ABNORMAL LOW (ref 0.9–3.3)

## 2015-08-03 LAB — CEA: CEA: 122.5 ng/mL — ABNORMAL HIGH (ref 0.0–5.0)

## 2015-08-03 MED ORDER — SODIUM CHLORIDE 0.9 % IV SOLN
2400.0000 mg/m2 | INTRAVENOUS | Status: DC
  Administered 2015-08-03: 5250 mg via INTRAVENOUS
  Filled 2015-08-03: qty 105

## 2015-08-03 MED ORDER — OXALIPLATIN CHEMO INJECTION 100 MG/20ML
85.0000 mg/m2 | Freq: Once | INTRAVENOUS | Status: AC
  Administered 2015-08-03: 185 mg via INTRAVENOUS
  Filled 2015-08-03: qty 37

## 2015-08-03 MED ORDER — SODIUM CHLORIDE 0.9 % IV SOLN
Freq: Once | INTRAVENOUS | Status: AC
  Administered 2015-08-03: 09:00:00 via INTRAVENOUS
  Filled 2015-08-03: qty 4

## 2015-08-03 MED ORDER — LEUCOVORIN CALCIUM INJECTION 350 MG
400.0000 mg/m2 | Freq: Once | INTRAVENOUS | Status: AC
  Administered 2015-08-03: 876 mg via INTRAVENOUS
  Filled 2015-08-03: qty 43.8

## 2015-08-03 MED ORDER — FLUOROURACIL CHEMO INJECTION 2.5 GM/50ML
400.0000 mg/m2 | Freq: Once | INTRAVENOUS | Status: AC
  Administered 2015-08-03: 900 mg via INTRAVENOUS
  Filled 2015-08-03: qty 18

## 2015-08-03 MED ORDER — DEXTROSE 5 % IV SOLN
Freq: Once | INTRAVENOUS | Status: AC
  Administered 2015-08-03: 09:00:00 via INTRAVENOUS

## 2015-08-03 NOTE — Progress Notes (Signed)
Oncology Nurse Navigator Documentation  Oncology Nurse Navigator Flowsheets 08/03/2015  Navigator Encounter Type Treatment  Patient Visit Type Medonc  Treatment Phase First Chemo Tx--FOLFOX  Barriers/Navigation Needs No barriers at this time  Interventions -  Support Groups/Services GI;Family Night Flyer  Time Spent with Patient 15  Met with patient during initial chemotherapy treatment to provide support and assess for needs to promote continuity of care 1. Reviewed antiemetics 2. Discussed balance between work and rest-wants to try to work some through treatment  Merceda Elks, [place], [place]

## 2015-08-03 NOTE — Patient Instructions (Signed)
[place] Discharge Instructions for Patients Receiving Chemotherapy  Today you received the following chemotherapy agents Oxaliplatin, leucovorin, 5FU To help prevent nausea and vomiting after your treatment, we encourage you to take your nausea medication as prescribed.   If you develop nausea and vomiting that is not controlled by your nausea medication, call the clinic.   BELOW ARE SYMPTOMS THAT SHOULD BE REPORTED IMMEDIATELY:  *FEVER GREATER THAN 100.5 F  *CHILLS WITH OR WITHOUT FEVER  NAUSEA AND VOMITING THAT IS NOT CONTROLLED WITH YOUR NAUSEA MEDICATION  *UNUSUAL SHORTNESS OF BREATH  *UNUSUAL BRUISING OR BLEEDING  TENDERNESS IN MOUTH AND THROAT WITH OR WITHOUT PRESENCE OF ULCERS  *URINARY PROBLEMS  *BOWEL PROBLEMS  UNUSUAL RASH Items with * indicate a potential emergency and should be followed up as soon as possible.  Feel free to call the clinic you have any questions or concerns. The clinic phone number is [phone].  Please show the [place] at check-in to the Emergency Department and triage nurse.

## 2015-08-05 ENCOUNTER — Ambulatory Visit (HOSPITAL_BASED_OUTPATIENT_CLINIC_OR_DEPARTMENT_OTHER): Payer: BLUE CROSS/BLUE SHIELD

## 2015-08-05 VITALS — BP 105/65 | HR 62 | Temp 98.4°F | Resp 18

## 2015-08-05 DIAGNOSIS — C155 Malignant neoplasm of lower third of esophagus: Secondary | ICD-10-CM

## 2015-08-05 DIAGNOSIS — C770 Secondary and unspecified malignant neoplasm of lymph nodes of head, face and neck: Secondary | ICD-10-CM

## 2015-08-05 MED ORDER — SODIUM CHLORIDE 0.9 % IJ SOLN
10.0000 mL | INTRAMUSCULAR | Status: DC | PRN
  Administered 2015-08-05: 10 mL
  Filled 2015-08-05: qty 10

## 2015-08-05 MED ORDER — HEPARIN SOD (PORK) LOCK FLUSH 100 UNIT/ML IV SOLN
500.0000 [IU] | Freq: Once | INTRAVENOUS | Status: AC | PRN
  Administered 2015-08-05: 500 [IU]
  Filled 2015-08-05: qty 5

## 2015-08-05 NOTE — Progress Notes (Signed)
Chemo F/U: Pt only complaint is being tires d/t unable to sleep good with the pump. Otherwise no complaints, stated he had a BM this AM 08/05/15. Pt instructed to call clinic with any concerns. Pt stable at time of d/c and verbalizes understanding.

## 2015-08-06 ENCOUNTER — Other Ambulatory Visit: Payer: Self-pay | Admitting: *Deleted

## 2015-08-06 ENCOUNTER — Telehealth: Payer: Self-pay | Admitting: *Deleted

## 2015-08-06 DIAGNOSIS — C155 Malignant neoplasm of lower third of esophagus: Secondary | ICD-10-CM

## 2015-08-06 MED ORDER — ONDANSETRON HCL 8 MG PO TABS
8.0000 mg | ORAL_TABLET | Freq: Three times a day (TID) | ORAL | Status: DC | PRN
Start: 2015-08-06 — End: 2015-11-23

## 2015-08-06 MED ORDER — DEXAMETHASONE 4 MG PO TABS: 4.0000 mg | ORAL_TABLET | Freq: Two times a day (BID) | ORAL | Status: DC

## 2015-08-06 NOTE — Telephone Encounter (Signed)
Pt called and left message about having more nausea and vomited today.   Spoke with pt and was informed that pt had severe nausea since chemo pump disconnected yesterday.  Pt had limited po intake due to decrease appetite and feeling more nauseated.  Pt stated he vomited  X 1  This am.  Took Compazine after but still feeling nauseated.   Had sips of fluids and so far liquids stay down.   Dr. Benay Spice notified.  Orders received for more antiemetics to call in for pt. Spoke with pt and informed pt that Zofran and Dexamethasone will be called in to [facility].  Pt is not a diabetic.  Reinforced with pt needs to force fluids as tolerated.  Pt understood to call office back if nausea/vomiting continues after taking antiemetics.

## 2015-08-17 ENCOUNTER — Ambulatory Visit (HOSPITAL_BASED_OUTPATIENT_CLINIC_OR_DEPARTMENT_OTHER): Payer: BLUE CROSS/BLUE SHIELD | Admitting: Nurse Practitioner

## 2015-08-17 ENCOUNTER — Telehealth: Payer: Self-pay | Admitting: Oncology

## 2015-08-17 ENCOUNTER — Other Ambulatory Visit: Payer: Self-pay | Admitting: Oncology

## 2015-08-17 ENCOUNTER — Ambulatory Visit (HOSPITAL_BASED_OUTPATIENT_CLINIC_OR_DEPARTMENT_OTHER): Payer: BLUE CROSS/BLUE SHIELD

## 2015-08-17 ENCOUNTER — Other Ambulatory Visit (HOSPITAL_BASED_OUTPATIENT_CLINIC_OR_DEPARTMENT_OTHER): Payer: BLUE CROSS/BLUE SHIELD

## 2015-08-17 VITALS — BP 118/70 | HR 83 | Temp 98.4°F | Resp 20 | Ht 72.0 in | Wt 206.9 lb

## 2015-08-17 DIAGNOSIS — R630 Anorexia: Secondary | ICD-10-CM

## 2015-08-17 DIAGNOSIS — C770 Secondary and unspecified malignant neoplasm of lymph nodes of head, face and neck: Secondary | ICD-10-CM | POA: Diagnosis not present

## 2015-08-17 DIAGNOSIS — C155 Malignant neoplasm of lower third of esophagus: Secondary | ICD-10-CM | POA: Diagnosis not present

## 2015-08-17 DIAGNOSIS — R634 Abnormal weight loss: Secondary | ICD-10-CM

## 2015-08-17 DIAGNOSIS — Z5111 Encounter for antineoplastic chemotherapy: Secondary | ICD-10-CM | POA: Diagnosis not present

## 2015-08-17 DIAGNOSIS — R1310 Dysphagia, unspecified: Secondary | ICD-10-CM

## 2015-08-17 LAB — CBC WITH DIFFERENTIAL/PLATELET
BASO%: 0.8 % (ref 0.0–2.0)
Basophils Absolute: 0 10*3/uL (ref 0.0–0.1)
EOS%: 5.6 % (ref 0.0–7.0)
Eosinophils Absolute: 0.2 10*3/uL (ref 0.0–0.5)
HCT: 43.4 % (ref 38.4–49.9)
HGB: 14.5 g/dL (ref 13.0–17.1)
LYMPH%: 14.1 % (ref 14.0–49.0)
MCH: 29.8 pg (ref 27.2–33.4)
MCHC: 33.5 g/dL (ref 32.0–36.0)
MCV: 88.9 fL (ref 79.3–98.0)
MONO#: 0.5 10*3/uL (ref 0.1–0.9)
MONO%: 12.4 % (ref 0.0–14.0)
NEUT#: 2.9 10*3/uL (ref 1.5–6.5)
NEUT%: 67.1 % (ref 39.0–75.0)
Platelets: 124 10*3/uL — ABNORMAL LOW (ref 140–400)
RBC: 4.88 10*6/uL (ref 4.20–5.82)
RDW: 13.9 % (ref 11.0–14.6)
WBC: 4.3 10*3/uL (ref 4.0–10.3)
lymph#: 0.6 10*3/uL — ABNORMAL LOW (ref 0.9–3.3)

## 2015-08-17 LAB — COMPREHENSIVE METABOLIC PANEL (CC13)
ALT: 20 U/L (ref 0–55)
AST: 16 U/L (ref 5–34)
Albumin: 4 g/dL (ref 3.5–5.0)
Alkaline Phosphatase: 72 U/L (ref 40–150)
Anion Gap: 8 mEq/L (ref 3–11)
BUN: 14.4 mg/dL (ref 7.0–26.0)
CO2: 25 mEq/L (ref 22–29)
Calcium: 9.4 mg/dL (ref 8.4–10.4)
Chloride: 107 mEq/L (ref 98–109)
Creatinine: 1.1 mg/dL (ref 0.7–1.3)
EGFR: 79 mL/min/{1.73_m2} — ABNORMAL LOW (ref >90)
Glucose: 170 mg/dl — ABNORMAL HIGH (ref 70–140)
Potassium: 4.3 mEq/L (ref 3.5–5.1)
Sodium: 140 mEq/L (ref 136–145)
Total Bilirubin: 0.58 mg/dL (ref 0.20–1.20)
Total Protein: 7.1 g/dL (ref 6.4–8.3)

## 2015-08-17 MED ORDER — OXALIPLATIN CHEMO INJECTION 100 MG/20ML
85.0000 mg/m2 | Freq: Once | INTRAVENOUS | Status: AC
  Administered 2015-08-17: 185 mg via INTRAVENOUS
  Filled 2015-08-17: qty 37

## 2015-08-17 MED ORDER — FLUOROURACIL CHEMO INJECTION 2.5 GM/50ML
400.0000 mg/m2 | Freq: Once | INTRAVENOUS | Status: AC
  Administered 2015-08-17: 900 mg via INTRAVENOUS
  Filled 2015-08-17: qty 18

## 2015-08-17 MED ORDER — PALONOSETRON HCL INJECTION 0.25 MG/5ML
0.2500 mg | Freq: Once | INTRAVENOUS | Status: AC
  Administered 2015-08-17: 0.25 mg via INTRAVENOUS

## 2015-08-17 MED ORDER — LEUCOVORIN CALCIUM INJECTION 350 MG
400.0000 mg/m2 | Freq: Once | INTRAVENOUS | Status: AC
  Administered 2015-08-17: 876 mg via INTRAVENOUS
  Filled 2015-08-17: qty 43.8

## 2015-08-17 MED ORDER — SODIUM CHLORIDE 0.9 % IV SOLN
2400.0000 mg/m2 | INTRAVENOUS | Status: DC
  Administered 2015-08-17: 5250 mg via INTRAVENOUS
  Filled 2015-08-17: qty 105

## 2015-08-17 MED ORDER — DEXTROSE 5 % IV SOLN
Freq: Once | INTRAVENOUS | Status: AC
  Administered 2015-08-17: 11:00:00 via INTRAVENOUS

## 2015-08-17 MED ORDER — SODIUM CHLORIDE 0.9 % IV SOLN
Freq: Once | INTRAVENOUS | Status: AC
  Administered 2015-08-17: 11:00:00 via INTRAVENOUS
  Filled 2015-08-17: qty 5

## 2015-08-17 MED ORDER — PALONOSETRON HCL INJECTION 0.25 MG/5ML
INTRAVENOUS | Status: AC
Start: 2015-08-17 — End: 2015-08-17
  Filled 2015-08-17: qty 5

## 2015-08-17 NOTE — Patient Instructions (Signed)
[place] Discharge Instructions for Patients Receiving Chemotherapy  Today you received the following chemotherapy agents :   Oxaliplatin,  Leucovorin,  Fluorouracil.  To help prevent nausea and vomiting after your treatment, we encourage you to take your nausea medication as prescribed.  DO NOT Take  Zofran for 3 days since you have received  Aloxi as pre med today.  Can take Compazine 10 mg every 6 hours as needed for nausea.  Can also take Dexamethasone 8 mg Twice daily for 2 days only after chemo to help with nausea.     If you develop nausea and vomiting that is not controlled by your nausea medication, call the clinic.   BELOW ARE SYMPTOMS THAT SHOULD BE REPORTED IMMEDIATELY:  *FEVER GREATER THAN 100.5 F  *CHILLS WITH OR WITHOUT FEVER  NAUSEA AND VOMITING THAT IS NOT CONTROLLED WITH YOUR NAUSEA MEDICATION  *UNUSUAL SHORTNESS OF BREATH  *UNUSUAL BRUISING OR BLEEDING  TENDERNESS IN MOUTH AND THROAT WITH OR WITHOUT PRESENCE OF ULCERS  *URINARY PROBLEMS  *BOWEL PROBLEMS  UNUSUAL RASH Items with * indicate a potential emergency and should be followed up as soon as possible.  Feel free to call the clinic you have any questions or concerns. The clinic phone number is [phone].  Please show the [place] at check-in to the Emergency Department and triage nurse.

## 2015-08-17 NOTE — Progress Notes (Signed)
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 He completed cycle 1 FOLFOX 08/03/2015. He had nausea beginning around day 3 and lasting 3-4 days. He vomited on 2 occasions. He took Compazine, Zofran and Decadron. No mouth sores. He did note that his gums became "sensitive". No diarrhea. He noted some constipation. He avoided cold for 4-5 days. He had some jaw pain.  Objective:  Vital signs in last 24 hours:  Blood pressure 118/70, pulse 83, temperature 98.4 F (36.9 C), temperature source Oral, resp. rate 20, height 6' (1.829 m), weight 206 lb 14.4 oz (93.849 kg), SpO2 98 %.    HEENT: No thrush or ulcers. Resp: Lungs clear bilaterally. Cardio: Regular rate and rhythm. GI: Abdomen soft and nontender. No hepatomegaly. Vascular: No leg edema. Calves soft and nontender. Neuro: Vibratory sense intact over the fingertips per tuning fork exam.  Skin: No rash. Port-A-Cath without erythema.    Lab Results:  Lab Results  Component Value Date   WBC 4.3 08/17/2015   HGB 14.5 08/17/2015   HCT 43.4 08/17/2015   MCV 88.9 08/17/2015   PLT 124* 08/17/2015   NEUTROABS 2.9 08/17/2015    Imaging:  No results found.  Medications: I have reviewed the patient's current medications.  Assessment/Plan: 1.Adenocarcinoma of the distal esophagus/gastric cardia-status post an endoscopic biopsy on 03/18/2013 confirming adenocarcinoma, HER-2/neu not amplified  -Staging CT scans 03/20/2013 confirmed a distal esophageal/gastric cardia mass, distal paraesophageal lymph nodes and paratracheal nodes concerning for metastatic lymphadenopathy  -A PET scan 03/27/2013 with a multifocal area of hypermetabolic activity involving the thoracic esophagus extending into the gastric cardia and hypermetabolic mediastinal nodes, no distant metastatic disease  -Initiation of radiation 04/07/2013 and weekly Taxol/carboplatin  04/08/2013, radiation completed 05/15/2013  -Restaging PET scan 06/09/2013-no evidence of metastatic disease, no hypermetabolic lymphadenopathy  -Esophagogastrectomy 07/07/2013 revealed a ypT1b,ypN0 tumor with negative surgical margins  -CT neck 06/09/2014 revealed a necrotic lymph node in the right tracheoesophageal groove  -PET scan 11/15/2014 revealed a single focus of hypermetabolism corresponding to the necrotic right upper mediastinum lymph node -EUS biopsy of the paraesophageal lymph node 06/25/2012 confirmed metastatic adenocarcinoma  -Radiation completed 08/11/2014 -Rising CEA May 2016 -PET scan 07/13/2015 with hypermetabolic right paratracheal and right hilar adenopathy -Cycle 1 FOLFOX 08/03/2015 -Cycle 2 FOLFOX 08/17/2015   2. Indeterminate 15 mm right liver lesion on the CT scan 03/20/2013 , MRI of the liver 06/09/2013 confirmed a right liver hemangioma  3. Anorexia/weight loss and solid dysphagia secondary to #1 -improved 4. History of a colon polyp, status post a polypectomy October 2013  5. history of Thrombocytopenia secondary chemotherapy-the carboplatin wase dose reduced with week 5 of chemotherapy chemotherapy  6. 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 Esophageal stricture-status post dilation 06/08/2015 8. Port-A-Cath placement 07/20/2015 9. Delayed nausea following cycle 1 FOLFOX. Aloxi and Emend added with cycle 2.   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 He has completed 1 cycle of FOLFOX. Plan to proceed with cycle 2 today as scheduled.  He had delayed nausea following cycle 1. We will add Aloxi and Emend today. He will contact the office if he has nausea despite these changes. The plan will be to add prophylactic Decadron if this occurs.   He will return for a follow-up  visit and  cycle 3 FOLFOX in 2 weeks. He will contact the office in the interim as outlined above or with any other problems.    Ilya, Neely ANP/GNP-BC   08/17/2015  9:48 AM

## 2015-08-17 NOTE — Telephone Encounter (Signed)
Gave patient avs report and appointments for September and October  °

## 2015-08-19 ENCOUNTER — Ambulatory Visit (HOSPITAL_BASED_OUTPATIENT_CLINIC_OR_DEPARTMENT_OTHER): Payer: BLUE CROSS/BLUE SHIELD

## 2015-08-19 VITALS — BP 132/70 | HR 101 | Temp 98.3°F | Resp 16

## 2015-08-19 DIAGNOSIS — C155 Malignant neoplasm of lower third of esophagus: Secondary | ICD-10-CM | POA: Diagnosis not present

## 2015-08-19 MED ORDER — HEPARIN SOD (PORK) LOCK FLUSH 100 UNIT/ML IV SOLN
500.0000 [IU] | Freq: Once | INTRAVENOUS | Status: AC | PRN
  Administered 2015-08-19: 500 [IU]
  Filled 2015-08-19: qty 5

## 2015-08-19 MED ORDER — SODIUM CHLORIDE 0.9 % IJ SOLN
10.0000 mL | INTRAMUSCULAR | Status: DC | PRN
  Administered 2015-08-19: 10 mL
  Filled 2015-08-19: qty 10

## 2015-08-19 NOTE — Patient Instructions (Signed)
[facility] Discharge Instructions for Patients Receiving Chemotherapy  Today you received the following chemotherapy agents fluorouricil  To help prevent nausea and vomiting after your treatment, we encourage you to take your nausea medication    If you develop nausea and vomiting that is not controlled by your nausea medication, call the clinic.   BELOW ARE SYMPTOMS THAT SHOULD BE REPORTED IMMEDIATELY:  *FEVER GREATER THAN 100.5 F  *CHILLS WITH OR WITHOUT FEVER  NAUSEA AND VOMITING THAT IS NOT CONTROLLED WITH YOUR NAUSEA MEDICATION  *UNUSUAL SHORTNESS OF BREATH  *UNUSUAL BRUISING OR BLEEDING  TENDERNESS IN MOUTH AND THROAT WITH OR WITHOUT PRESENCE OF ULCERS  *URINARY PROBLEMS  *BOWEL PROBLEMS  UNUSUAL RASH Items with * indicate a potential emergency and should be followed up as soon as possible.  Feel free to call the clinic you have any questions or concerns. The clinic phone number is [phone].  Please show the [place] at check-in to the Emergency Department and triage nurse.

## 2015-08-19 NOTE — Progress Notes (Signed)
Pt reports better nausea control during this treatment.  When deaccessing port, slight redness noted above insertion site. Also, biopatch was not over the needle but was found to the upper left side of the needle. Pt denies pain, tenderness. Afebrile, vital signs stable.

## 2015-08-29 ENCOUNTER — Other Ambulatory Visit: Payer: Self-pay | Admitting: Oncology

## 2015-08-31 ENCOUNTER — Ambulatory Visit (HOSPITAL_BASED_OUTPATIENT_CLINIC_OR_DEPARTMENT_OTHER): Payer: BLUE CROSS/BLUE SHIELD | Admitting: Oncology

## 2015-08-31 ENCOUNTER — Other Ambulatory Visit (HOSPITAL_BASED_OUTPATIENT_CLINIC_OR_DEPARTMENT_OTHER): Payer: BLUE CROSS/BLUE SHIELD

## 2015-08-31 ENCOUNTER — Ambulatory Visit (HOSPITAL_BASED_OUTPATIENT_CLINIC_OR_DEPARTMENT_OTHER): Payer: BLUE CROSS/BLUE SHIELD

## 2015-08-31 VITALS — BP 130/71 | HR 84 | Temp 98.9°F | Resp 18 | Ht 72.0 in | Wt 206.0 lb

## 2015-08-31 DIAGNOSIS — C155 Malignant neoplasm of lower third of esophagus: Secondary | ICD-10-CM

## 2015-08-31 DIAGNOSIS — Z5111 Encounter for antineoplastic chemotherapy: Secondary | ICD-10-CM

## 2015-08-31 LAB — CBC WITH DIFFERENTIAL/PLATELET
BASO%: 0.4 % (ref 0.0–2.0)
Basophils Absolute: 0 10*3/uL (ref 0.0–0.1)
EOS%: 3.1 % (ref 0.0–7.0)
Eosinophils Absolute: 0.1 10*3/uL (ref 0.0–0.5)
HEMATOCRIT: 40 % (ref 38.4–49.9)
HEMOGLOBIN: 13.5 g/dL (ref 13.0–17.1)
LYMPH#: 1 10*3/uL (ref 0.9–3.3)
LYMPH%: 21.1 % (ref 14.0–49.0)
MCH: 30.3 pg (ref 27.2–33.4)
MCHC: 33.8 g/dL (ref 32.0–36.0)
MCV: 89.7 fL (ref 79.3–98.0)
MONO#: 0.8 10*3/uL (ref 0.1–0.9)
MONO%: 18.2 % — AB (ref 0.0–14.0)
NEUT#: 2.6 10*3/uL (ref 1.5–6.5)
NEUT%: 57.2 % (ref 39.0–75.0)
Platelets: 77 10*3/uL — ABNORMAL LOW (ref 140–400)
RBC: 4.46 10*6/uL (ref 4.20–5.82)
RDW: 14.9 % — AB (ref 11.0–14.6)
WBC: 4.5 10*3/uL (ref 4.0–10.3)

## 2015-08-31 LAB — CEA: CEA: 88.6 ng/mL — AB (ref 0.0–5.0)

## 2015-08-31 LAB — COMPREHENSIVE METABOLIC PANEL (CC13)
ALT: 15 U/L (ref 0–55)
AST: 14 U/L (ref 5–34)
Albumin: 3.9 g/dL (ref 3.5–5.0)
Alkaline Phosphatase: 69 U/L (ref 40–150)
Anion Gap: 8 mEq/L (ref 3–11)
BUN: 15 mg/dL (ref 7.0–26.0)
CALCIUM: 9.5 mg/dL (ref 8.4–10.4)
CHLORIDE: 107 meq/L (ref 98–109)
CO2: 29 meq/L (ref 22–29)
CREATININE: 1 mg/dL (ref 0.7–1.3)
EGFR: >90 mL/min/{1.73_m2} (ref >90)
GLUCOSE: 111 mg/dL (ref 70–140)
POTASSIUM: 4.4 meq/L (ref 3.5–5.1)
SODIUM: 143 meq/L (ref 136–145)
Total Bilirubin: 0.41 mg/dL (ref 0.20–1.20)
Total Protein: 6.9 g/dL (ref 6.4–8.3)

## 2015-08-31 MED ORDER — OXALIPLATIN CHEMO INJECTION 100 MG/20ML
60.0000 mg/m2 | Freq: Once | INTRAVENOUS | Status: AC
  Administered 2015-08-31: 130 mg via INTRAVENOUS
  Filled 2015-08-31: qty 20

## 2015-08-31 MED ORDER — LEUCOVORIN CALCIUM INJECTION 350 MG
400.0000 mg/m2 | Freq: Once | INTRAVENOUS | Status: AC
  Administered 2015-08-31: 876 mg via INTRAVENOUS
  Filled 2015-08-31: qty 43.8

## 2015-08-31 MED ORDER — DEXTROSE 5 % IV SOLN
Freq: Once | INTRAVENOUS | Status: AC
  Administered 2015-08-31: 12:00:00 via INTRAVENOUS

## 2015-08-31 MED ORDER — PALONOSETRON HCL INJECTION 0.25 MG/5ML
INTRAVENOUS | Status: AC
  Filled 2015-08-31: qty 5

## 2015-08-31 MED ORDER — FOSAPREPITANT DIMEGLUMINE INJECTION 150 MG
Freq: Once | INTRAVENOUS | Status: AC
  Administered 2015-08-31: 13:00:00 via INTRAVENOUS
  Filled 2015-08-31: qty 5

## 2015-08-31 MED ORDER — FLUOROURACIL CHEMO INJECTION 5 GM/100ML
2400.0000 mg/m2 | INTRAVENOUS | Status: DC
  Administered 2015-08-31: 5250 mg via INTRAVENOUS
  Filled 2015-08-31: qty 105

## 2015-08-31 MED ORDER — PALONOSETRON HCL INJECTION 0.25 MG/5ML
0.2500 mg | Freq: Once | INTRAVENOUS | Status: AC
  Administered 2015-08-31: 0.25 mg via INTRAVENOUS

## 2015-08-31 MED ORDER — FLUOROURACIL CHEMO INJECTION 2.5 GM/50ML
400.0000 mg/m2 | Freq: Once | INTRAVENOUS | Status: AC
  Administered 2015-08-31: 900 mg via INTRAVENOUS
  Filled 2015-08-31: qty 18

## 2015-08-31 MED ORDER — MIRTAZAPINE 15 MG PO TABS: 15.0000 mg | ORAL_TABLET | Freq: Every day | ORAL | Status: DC

## 2015-08-31 NOTE — Progress Notes (Signed)
Per Dr. Benay Spice, okay to tx with platelets 77; oxaliplatin to be dose-reduce.

## 2015-08-31 NOTE — Progress Notes (Signed)
[facility] OFFICE PROGRESS NOTE   Diagnosis: Esophagus cancer  INTERVAL HISTORY:   [facility] returns as scheduled. [facility]borne completed another cycle of FOLFOX 08/17/2015. Joel Osborne reports less nausea following this cycle of chemotherapy. Good appetite. [facility] has discomfort at the right side of the neck. [facility] is working. No diarrhea. [facility] reports Joel Osborne has emotional outbursts following chemotherapy. This has occurred on day 3 following the last 2 cycles of chemotherapy. Joel Osborne states that Joel Osborne has noted increased emotions. [facility] has cold sensitivity following chemotherapy. No significant numbness at present.   Objective:  Vital signs in last 24 hours:  Blood pressure 130/71, pulse 84, temperature 98.9 F (37.2 C), temperature source Oral, resp. rate 18, height 6' (1.829 m), weight 206 lb (93.441 kg), SpO2 97 %.    HEENT:  no thrush or ulcers, tender at the right upper neck near the angle of the jaw. No mass. Lymphatics:  no cervical or supraclavicular nodes Resp:  lungs clear bilaterally Cardio:  regular rate and rhythm GI:  no hepatosplenomegaly, nontender, no mass Vascular:  no leg edema Neurologic: Mild decrease in vibratory sense at the fingertips bilaterally     Portacath/PICC-without erythema  Lab Results:  Lab Results  Component Value Date   WBC 4.5 08/31/2015   HGB 13.5 08/31/2015   HCT 40.0 08/31/2015   MCV 89.7 08/31/2015   PLT 77* 08/31/2015   NEUTROABS 2.6 08/31/2015      Lab Results  Component Value Date   CEA 122.5* 08/03/2015     Medications: I have reviewed the patient's current medications.  Assessment/Plan: 1.Adenocarcinoma of the distal esophagus/gastric cardia-status post an endoscopic biopsy on 03/18/2013 confirming adenocarcinoma, HER-2/neu not amplified  -Staging CT scans 03/20/2013 confirmed a distal esophageal/gastric cardia mass, distal paraesophageal lymph nodes and paratracheal nodes concerning for metastatic lymphadenopathy  -A PET scan  03/27/2013 with a multifocal area of hypermetabolic activity involving the thoracic esophagus extending into the gastric cardia and hypermetabolic mediastinal nodes, no distant metastatic disease  -Initiation of radiation 04/07/2013 and weekly Taxol/carboplatin 04/08/2013, radiation completed 05/15/2013  -Restaging PET scan 06/09/2013-no evidence of metastatic disease, no hypermetabolic lymphadenopathy  -Esophagogastrectomy 07/07/2013 revealed a ypT1b,ypN0 tumor with negative surgical margins  -CT neck 06/09/2014 revealed a necrotic lymph node in the right tracheoesophageal groove  -PET scan 11/15/2014 revealed a single focus of hypermetabolism corresponding to the necrotic right upper mediastinum lymph node -EUS biopsy of the paraesophageal lymph node 06/25/2012 confirmed metastatic adenocarcinoma  -Radiation completed 08/11/2014 -Rising CEA May 2016 -PET scan 07/13/2015 with hypermetabolic right paratracheal and right hilar adenopathy -Cycle 1 FOLFOX 08/03/2015 -Cycle 2 FOLFOX 08/17/2015 - cycle 3 FOLFOX 08/31/2015 (oxaliplatin dose reduced secondary to thrombocytopenia) disease    2. Indeterminate 15 mm right liver lesion on the CT scan 03/20/2013 , MRI of the liver 06/09/2013 confirmed a right liver hemangioma  3. Anorexia/weight loss and solid dysphagia secondary to #1 -improved 4. History of a colon polyp, status post a polypectomy October 2013  5. history of Thrombocytopenia secondary chemotherapy-the carboplatin wase dose reduced with week 5 of chemotherapy chemotherapy  6. 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 Esophageal stricture-status post dilation 06/08/2015 8. Port-A-Cath placement 07/20/2015 9. Delayed nausea following cycle 1 FOLFOX. Aloxi and Emend added with cycle 2. 10.  thrombocytopenia secondary to chemotherapy  11. Emotional lability/depression-prophylactic Decadron will be discontinued and [facility]  will begin Remeron     Disposition:   [facility] is completed 2 cycles of FOLFOX. [facility] has moderate thrombocytopenia today. I recommended delaying chemotherapy for 1 week, but [facility] would very much like to proceed with treatment today. We decided to dose reduce the oxaliplatin and proceed with cycle 3 FOLFOX today. [facility] understands the increased risk of bleeding with the moderate thrombocytopenia. [facility]borne will contact us for spontaneous bleeding or bruising and [facility] will return next week for a CBC.  The emotional lability may be related to situational depression/anxiety. Decadron may be contributing. We will eliminate the home prophylactic Decadron with this cycle. [facility] will begin a trial of Remeron. [facility]borne declined a psychology referral.  [facility] will return for an office visit and chemotherapy in 2 weeks. We will follow-up on the CEA from today.  Betsy Coder, MD  08/31/2015  12:16 PM

## 2015-08-31 NOTE — Patient Instructions (Signed)
[facility] Discharge Instructions for Patients Receiving Chemotherapy  Today you received the following chemotherapy agents: Oxaliplatin, Leucovorin and Fluorouracil.   Home infusion pump with Fluorouracil (5FU) infusing over 46 hrs.   To help prevent nausea and vomiting after your treatment, we encourage you to take your nausea medication as directed.    If you develop nausea and vomiting that is not controlled by your nausea medication, call the clinic.   BELOW ARE SYMPTOMS THAT SHOULD BE REPORTED IMMEDIATELY:  *FEVER GREATER THAN 100.5 F  *CHILLS WITH OR WITHOUT FEVER  NAUSEA AND VOMITING THAT IS NOT CONTROLLED WITH YOUR NAUSEA MEDICATION  *UNUSUAL SHORTNESS OF BREATH  *UNUSUAL BRUISING OR BLEEDING  TENDERNESS IN MOUTH AND THROAT WITH OR WITHOUT PRESENCE OF ULCERS  *URINARY PROBLEMS  *BOWEL PROBLEMS  UNUSUAL RASH Items with * indicate a potential emergency and should be followed up as soon as possible.  Feel free to call the clinic you have any questions or concerns. The clinic phone number is [phone].  Please show the [place] at check-in to the Emergency Department and triage nurse.

## 2015-09-01 ENCOUNTER — Telehealth: Payer: Self-pay | Admitting: *Deleted

## 2015-09-01 NOTE — Telephone Encounter (Signed)
-----   Message from Ladell Pier, MD sent at 09/01/2015  8:24 AM EDT ----- Please call patient, [facility] is better

## 2015-09-01 NOTE — Telephone Encounter (Signed)
Pt's wife made aware of results, she voiced understanding, pt nearby and states he understands also

## 2015-09-02 ENCOUNTER — Ambulatory Visit (HOSPITAL_BASED_OUTPATIENT_CLINIC_OR_DEPARTMENT_OTHER): Payer: BLUE CROSS/BLUE SHIELD

## 2015-09-02 VITALS — BP 119/76 | HR 71 | Temp 98.0°F | Resp 18

## 2015-09-02 DIAGNOSIS — C155 Malignant neoplasm of lower third of esophagus: Secondary | ICD-10-CM | POA: Diagnosis not present

## 2015-09-02 MED ORDER — SODIUM CHLORIDE 0.9 % IJ SOLN
10.0000 mL | INTRAMUSCULAR | Status: DC | PRN
Start: 2015-09-02 — End: 2015-09-02
  Administered 2015-09-02: 10 mL
  Filled 2015-09-02: qty 10

## 2015-09-02 MED ORDER — HEPARIN SOD (PORK) LOCK FLUSH 100 UNIT/ML IV SOLN
500.0000 [IU] | Freq: Once | INTRAVENOUS | Status: AC | PRN
  Administered 2015-09-02: 500 [IU]
  Filled 2015-09-02: qty 5

## 2015-09-08 ENCOUNTER — Telehealth: Payer: Self-pay | Admitting: Internal Medicine

## 2015-09-08 NOTE — Telephone Encounter (Signed)
First available in the [facility] is 11/09/15. Do you want me to open up a 7:30am slot for him? Please advise.

## 2015-09-08 NOTE — Telephone Encounter (Signed)
Yes open up 7:30 slot thanks

## 2015-09-08 NOTE — Telephone Encounter (Signed)
Pt states he ate some food this am and it got stuck. Pt states he vomited but he is now unable to eat or drink anything. Pt instructed to go to [facility] ER. Pt verbalized understanding and Lori Hvozdovic, PA-C aware that pt will be coming to the ER.

## 2015-09-08 NOTE — Telephone Encounter (Signed)
Pt states he has passed what was stuck and he is able to swallow now. Pt states it feels a little irritated/bruised now. Pt instructed to eat soft foods like soup, yougart for the next 24-48 hours. Pt verbalized understanding. Dr. Henrene Pastor do you want to see the pt for OV now?

## 2015-09-08 NOTE — Telephone Encounter (Signed)
Needs to stay on modified diet to avoid significant solids. He will likely need repeat EGD with dilation in the [place]. Please arrange

## 2015-09-09 ENCOUNTER — Other Ambulatory Visit: Payer: Self-pay

## 2015-09-09 DIAGNOSIS — R1314 Dysphagia, pharyngoesophageal phase: Secondary | ICD-10-CM

## 2015-09-09 NOTE — Telephone Encounter (Signed)
Pt scheduled for previsit 09/13/15'@1'$ :30pm. Pt scheduled for EGD with dil in the [place] 10/06/15'@7'$ :30am. Pt aware of appts.

## 2015-09-13 ENCOUNTER — Ambulatory Visit (AMBULATORY_SURGERY_CENTER): Payer: Self-pay | Admitting: *Deleted

## 2015-09-13 VITALS — Ht 72.0 in | Wt 206.2 lb

## 2015-09-13 DIAGNOSIS — R1310 Dysphagia, unspecified: Secondary | ICD-10-CM

## 2015-09-13 NOTE — Progress Notes (Signed)
No egg or soy allergy No issues with past sedation No diet pills No home 02 use Pt has on going chemo at the present and has a port. Pt asked if we can use his port and informed we do not access ports here, he verbalized understanding

## 2015-09-14 ENCOUNTER — Ambulatory Visit (HOSPITAL_BASED_OUTPATIENT_CLINIC_OR_DEPARTMENT_OTHER): Payer: BLUE CROSS/BLUE SHIELD | Admitting: Nurse Practitioner

## 2015-09-14 ENCOUNTER — Other Ambulatory Visit (HOSPITAL_BASED_OUTPATIENT_CLINIC_OR_DEPARTMENT_OTHER): Payer: BLUE CROSS/BLUE SHIELD

## 2015-09-14 ENCOUNTER — Telehealth: Payer: Self-pay | Admitting: Nurse Practitioner

## 2015-09-14 ENCOUNTER — Other Ambulatory Visit: Payer: Self-pay | Admitting: Nurse Practitioner

## 2015-09-14 ENCOUNTER — Telehealth: Payer: Self-pay | Admitting: *Deleted

## 2015-09-14 ENCOUNTER — Ambulatory Visit (HOSPITAL_BASED_OUTPATIENT_CLINIC_OR_DEPARTMENT_OTHER): Payer: BLUE CROSS/BLUE SHIELD

## 2015-09-14 VITALS — BP 134/78 | HR 98 | Temp 98.8°F | Resp 18 | Ht 72.0 in | Wt 204.8 lb

## 2015-09-14 DIAGNOSIS — C155 Malignant neoplasm of lower third of esophagus: Secondary | ICD-10-CM

## 2015-09-14 DIAGNOSIS — Z5111 Encounter for antineoplastic chemotherapy: Secondary | ICD-10-CM

## 2015-09-14 DIAGNOSIS — D6959 Other secondary thrombocytopenia: Secondary | ICD-10-CM | POA: Diagnosis not present

## 2015-09-14 DIAGNOSIS — K5900 Constipation, unspecified: Secondary | ICD-10-CM

## 2015-09-14 DIAGNOSIS — R634 Abnormal weight loss: Secondary | ICD-10-CM

## 2015-09-14 DIAGNOSIS — C770 Secondary and unspecified malignant neoplasm of lymph nodes of head, face and neck: Secondary | ICD-10-CM | POA: Diagnosis not present

## 2015-09-14 DIAGNOSIS — R1310 Dysphagia, unspecified: Secondary | ICD-10-CM

## 2015-09-14 DIAGNOSIS — R630 Anorexia: Secondary | ICD-10-CM

## 2015-09-14 LAB — CBC WITH DIFFERENTIAL/PLATELET
BASO%: 0.8 % (ref 0.0–2.0)
Basophils Absolute: 0 10*3/uL (ref 0.0–0.1)
EOS ABS: 0.1 10*3/uL (ref 0.0–0.5)
EOS%: 3.4 % (ref 0.0–7.0)
HCT: 42.7 % (ref 38.4–49.9)
HGB: 14.4 g/dL (ref 13.0–17.1)
LYMPH%: 17.4 % (ref 14.0–49.0)
MCH: 30.4 pg (ref 27.2–33.4)
MCHC: 33.7 g/dL (ref 32.0–36.0)
MCV: 90.4 fL (ref 79.3–98.0)
MONO#: 0.7 10*3/uL (ref 0.1–0.9)
MONO%: 16.9 % — AB (ref 0.0–14.0)
NEUT#: 2.5 10*3/uL (ref 1.5–6.5)
NEUT%: 61.5 % (ref 39.0–75.0)
Platelets: 107 10*3/uL — ABNORMAL LOW (ref 140–400)
RBC: 4.72 10*6/uL (ref 4.20–5.82)
RDW: 15.6 % — ABNORMAL HIGH (ref 11.0–14.6)
WBC: 4 10*3/uL (ref 4.0–10.3)
lymph#: 0.7 10*3/uL — ABNORMAL LOW (ref 0.9–3.3)

## 2015-09-14 LAB — COMPREHENSIVE METABOLIC PANEL (CC13)
ALT: 19 U/L (ref 0–55)
ANION GAP: 9 meq/L (ref 3–11)
AST: 18 U/L (ref 5–34)
Albumin: 3.9 g/dL (ref 3.5–5.0)
Alkaline Phosphatase: 84 U/L (ref 40–150)
BILIRUBIN TOTAL: 0.68 mg/dL (ref 0.20–1.20)
BUN: 12.1 mg/dL (ref 7.0–26.0)
CHLORIDE: 108 meq/L (ref 98–109)
CO2: 26 meq/L (ref 22–29)
Calcium: 9.7 mg/dL (ref 8.4–10.4)
Creatinine: 1 mg/dL (ref 0.7–1.3)
GLUCOSE: 126 mg/dL (ref 70–140)
POTASSIUM: 5 meq/L (ref 3.5–5.1)
SODIUM: 143 meq/L (ref 136–145)
Total Protein: 7.1 g/dL (ref 6.4–8.3)

## 2015-09-14 LAB — CEA: CEA: 108.1 ng/mL — ABNORMAL HIGH (ref 0.0–5.0)

## 2015-09-14 MED ORDER — ZOLPIDEM TARTRATE 10 MG PO TABS: 10.0000 mg | ORAL_TABLET | Freq: Every evening | ORAL | Status: DC | PRN

## 2015-09-14 MED ORDER — DEXTROSE 5 % IV SOLN
60.0000 mg/m2 | Freq: Once | INTRAVENOUS | Status: AC
  Administered 2015-09-14: 130 mg via INTRAVENOUS
  Filled 2015-09-14: qty 10

## 2015-09-14 MED ORDER — LEUCOVORIN CALCIUM INJECTION 350 MG
400.0000 mg/m2 | Freq: Once | INTRAMUSCULAR | Status: AC
  Administered 2015-09-14: 876 mg via INTRAVENOUS
  Filled 2015-09-14: qty 43.8

## 2015-09-14 MED ORDER — SODIUM CHLORIDE 0.9 % IV SOLN
2400.0000 mg/m2 | INTRAVENOUS | Status: DC
  Administered 2015-09-14: 5250 mg via INTRAVENOUS
  Filled 2015-09-14: qty 105

## 2015-09-14 MED ORDER — PALONOSETRON HCL INJECTION 0.25 MG/5ML
INTRAVENOUS | Status: AC
  Filled 2015-09-14: qty 5

## 2015-09-14 MED ORDER — DEXTROSE 5 % IV SOLN
Freq: Once | INTRAVENOUS | Status: AC
  Administered 2015-09-14: 10:00:00 via INTRAVENOUS

## 2015-09-14 MED ORDER — FLUOROURACIL CHEMO INJECTION 2.5 GM/50ML
400.0000 mg/m2 | Freq: Once | INTRAVENOUS | Status: AC
  Administered 2015-09-14: 900 mg via INTRAVENOUS
  Filled 2015-09-14: qty 18

## 2015-09-14 MED ORDER — PALONOSETRON HCL INJECTION 0.25 MG/5ML
0.2500 mg | Freq: Once | INTRAVENOUS | Status: AC
  Administered 2015-09-14: 0.25 mg via INTRAVENOUS

## 2015-09-14 MED ORDER — FOSAPREPITANT DIMEGLUMINE INJECTION 150 MG
Freq: Once | INTRAVENOUS | Status: AC
  Administered 2015-09-14: 11:00:00 via INTRAVENOUS
  Filled 2015-09-14: qty 5

## 2015-09-14 NOTE — Telephone Encounter (Signed)
Pt confirmed labs/ov per 10/04 POF, gave pt AVS and Calendar.... KJ, sent msg to add chemo

## 2015-09-14 NOTE — Telephone Encounter (Signed)
Per staff message and POF I have scheduled appts. Advised scheduler of appts. JMW  

## 2015-09-14 NOTE — Progress Notes (Addendum)
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 He completed cycle 3 FOLFOX 08/31/2015. He denies nausea/vomiting. No mouth sores. No diarrhea. He has been constipated. Cold sensitivity lasted 5-7 days. No persistent neuropathy symptoms though he does note an altered sensation at the fingertips. This does not interfere with activity. He continues to note emotional lability. This typically occurs on the weekends. He continues to have right sided neck pain.  Objective:  Vital signs in last 24 hours:  Blood pressure 134/78, pulse 98, temperature 98.8 F (37.1 C), temperature source Oral, resp. rate 18, height 6' (1.829 m), weight 204 lb 12.8 oz (92.897 kg), SpO2 99 %.    HEENT: No thrush or ulcers. Right submandibular gland is more prominent than the left. Resp: Lungs clear bilaterally. Cardio: Regular rate and rhythm. GI: Abdomen soft and nontender. No hepatomegaly. No mass. Vascular: No leg edema. Neuro: Mild decrease in vibratory sense at the fingertips bilaterally.  Skin: No rash. Port-A-Cath without erythema.    Lab Results:  Lab Results  Component Value Date   WBC 4.0 09/14/2015   HGB 14.4 09/14/2015   HCT 42.7 09/14/2015   MCV 90.4 09/14/2015   PLT 107* 09/14/2015   NEUTROABS 2.5 09/14/2015    Imaging:  No results found.  Medications: I have reviewed the patient's current medications.  Assessment/Plan: 1.Adenocarcinoma of the distal esophagus/gastric cardia-status post an endoscopic biopsy on 03/18/2013 confirming adenocarcinoma, HER-2/neu not amplified  -Staging CT scans 03/20/2013 confirmed a distal esophageal/gastric cardia mass, distal paraesophageal lymph nodes and paratracheal nodes concerning for metastatic lymphadenopathy  -A PET scan 03/27/2013 with a multifocal area of hypermetabolic activity involving the thoracic esophagus extending into the gastric cardia and  hypermetabolic mediastinal nodes, no distant metastatic disease  -Initiation of radiation 04/07/2013 and weekly Taxol/carboplatin 04/08/2013, radiation completed 05/15/2013  -Restaging PET scan 06/09/2013-no evidence of metastatic disease, no hypermetabolic lymphadenopathy  -Esophagogastrectomy 07/07/2013 revealed a ypT1b,ypN0 tumor with negative surgical margins  -CT neck 06/09/2014 revealed a necrotic lymph node in the right tracheoesophageal groove  -PET scan 11/15/2014 revealed a single focus of hypermetabolism corresponding to the necrotic right upper mediastinum lymph node -EUS biopsy of the paraesophageal lymph node 06/25/2012 confirmed metastatic adenocarcinoma  -Radiation completed 08/11/2014 -Rising CEA May 2016 -PET scan 07/13/2015 with hypermetabolic right paratracheal and right hilar adenopathy -Cycle 1 FOLFOX 08/03/2015 -Cycle 2 FOLFOX 08/17/2015 - cycle 3 FOLFOX 08/31/2015 (oxaliplatin dose reduced secondary to thrombocytopenia)   -Cycle 4 FOLFOX 09/14/2015   2. Indeterminate 15 mm right liver lesion on the CT scan 03/20/2013, MRI of the liver 06/09/2013 confirmed a right liver hemangioma  3. Anorexia/weight loss and solid dysphagia secondary to #1 -improved 4. History of a colon polyp, status post a polypectomy October 2013  5. history of Thrombocytopenia secondary chemotherapy-the carboplatin wase dose reduced with week 5 of chemotherapy chemotherapy  6. 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 Esophageal stricture-status post dilation 06/08/2015 8. Port-A-Cath placement 07/20/2015 9. Delayed nausea following cycle 1 FOLFOX. Aloxi and Emend added with cycle 2. 10. Thrombocytopenia secondary to chemotherapy  11. 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 Plan to proceed with cycle 4 today as scheduled. The plan is to obtain restaging CT scans (neck  and chest) after completion of 5 cycles of FOLFOX.  He will try Miralax for the constipation.  He will return for a follow-up visit and cycle 5 FOLFOX in 2 weeks. He will contact the office in the interim with any problems.  Patient seen with Dr. Benay Spice.    [facility], [facility] ANP/GNP-BC   09/14/2015  9:16 AM  This was a shared visit with [facility]. 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 He will complete cycle 5 FOLFOX and then undergo a restaging evaluation. The right submandibular tenderness may be related to a benign condition or metastatic disease. We will include a neck CT with the restaging evaluation.  [facility], M.D.

## 2015-09-14 NOTE — Telephone Encounter (Signed)
Refill called in to Pharmacy for Ambien

## 2015-09-14 NOTE — Patient Instructions (Signed)
[facility] Discharge Instructions for Patients Receiving Chemotherapy  Today you received the following chemotherapy agents Oxaliplatin/Leucovorin/5 FU  To help prevent nausea and vomiting after your treatment, we encourage you to take your nausea medication as prescribed.   If you develop nausea and vomiting that is not controlled by your nausea medication, call the clinic.   BELOW ARE SYMPTOMS THAT SHOULD BE REPORTED IMMEDIATELY:  *FEVER GREATER THAN 100.5 F  *CHILLS WITH OR WITHOUT FEVER  NAUSEA AND VOMITING THAT IS NOT CONTROLLED WITH YOUR NAUSEA MEDICATION  *UNUSUAL SHORTNESS OF BREATH  *UNUSUAL BRUISING OR BLEEDING  TENDERNESS IN MOUTH AND THROAT WITH OR WITHOUT PRESENCE OF ULCERS  *URINARY PROBLEMS  *BOWEL PROBLEMS  UNUSUAL RASH Items with * indicate a potential emergency and should be followed up as soon as possible.  Feel free to call the clinic you have any questions or concerns. The clinic phone number is [phone].  Please show the CHEMO ALERT CARD at check-in to the Emergency Department and triage nurse.   

## 2015-09-16 ENCOUNTER — Ambulatory Visit (HOSPITAL_BASED_OUTPATIENT_CLINIC_OR_DEPARTMENT_OTHER): Payer: BLUE CROSS/BLUE SHIELD

## 2015-09-16 VITALS — BP 124/73 | HR 111 | Temp 98.2°F | Resp 18

## 2015-09-16 DIAGNOSIS — C155 Malignant neoplasm of lower third of esophagus: Secondary | ICD-10-CM

## 2015-09-16 DIAGNOSIS — Z452 Encounter for adjustment and management of vascular access device: Secondary | ICD-10-CM | POA: Diagnosis not present

## 2015-09-16 MED ORDER — SODIUM CHLORIDE 0.9 % IJ SOLN
10.0000 mL | INTRAMUSCULAR | Status: DC | PRN
  Administered 2015-09-16: 10 mL
  Filled 2015-09-16: qty 10

## 2015-09-16 MED ORDER — HEPARIN SOD (PORK) LOCK FLUSH 100 UNIT/ML IV SOLN
500.0000 [IU] | Freq: Once | INTRAVENOUS | Status: AC | PRN
  Administered 2015-09-16: 500 [IU]
  Filled 2015-09-16: qty 5

## 2015-09-26 ENCOUNTER — Other Ambulatory Visit: Payer: Self-pay | Admitting: Oncology

## 2015-09-28 ENCOUNTER — Ambulatory Visit (HOSPITAL_BASED_OUTPATIENT_CLINIC_OR_DEPARTMENT_OTHER): Payer: BLUE CROSS/BLUE SHIELD | Admitting: Nurse Practitioner

## 2015-09-28 ENCOUNTER — Other Ambulatory Visit (HOSPITAL_BASED_OUTPATIENT_CLINIC_OR_DEPARTMENT_OTHER): Payer: BLUE CROSS/BLUE SHIELD

## 2015-09-28 ENCOUNTER — Ambulatory Visit (HOSPITAL_BASED_OUTPATIENT_CLINIC_OR_DEPARTMENT_OTHER): Payer: BLUE CROSS/BLUE SHIELD

## 2015-09-28 VITALS — BP 120/77 | HR 100 | Temp 98.7°F | Resp 18 | Ht 72.0 in | Wt 206.0 lb

## 2015-09-28 DIAGNOSIS — Z5111 Encounter for antineoplastic chemotherapy: Secondary | ICD-10-CM

## 2015-09-28 DIAGNOSIS — C155 Malignant neoplasm of lower third of esophagus: Secondary | ICD-10-CM

## 2015-09-28 LAB — COMPREHENSIVE METABOLIC PANEL (CC13)
ALK PHOS: 84 U/L (ref 40–150)
ALT: 19 U/L (ref 0–55)
ANION GAP: 8 meq/L (ref 3–11)
AST: 17 U/L (ref 5–34)
Albumin: 3.9 g/dL (ref 3.5–5.0)
BILIRUBIN TOTAL: 0.53 mg/dL (ref 0.20–1.20)
BUN: 14.7 mg/dL (ref 7.0–26.0)
CALCIUM: 9.4 mg/dL (ref 8.4–10.4)
CO2: 26 meq/L (ref 22–29)
CREATININE: 1 mg/dL (ref 0.7–1.3)
Chloride: 107 mEq/L (ref 98–109)
EGFR: 89 mL/min/{1.73_m2} — AB (ref >90)
Glucose: 105 mg/dl (ref 70–140)
Potassium: 4.6 mEq/L (ref 3.5–5.1)
Sodium: 142 mEq/L (ref 136–145)
TOTAL PROTEIN: 7 g/dL (ref 6.4–8.3)

## 2015-09-28 LAB — CBC WITH DIFFERENTIAL/PLATELET
BASO%: 1 % (ref 0.0–2.0)
Basophils Absolute: 0 10*3/uL (ref 0.0–0.1)
EOS ABS: 0.1 10*3/uL (ref 0.0–0.5)
EOS%: 3.4 % (ref 0.0–7.0)
HEMATOCRIT: 39.5 % (ref 38.4–49.9)
HGB: 13.2 g/dL (ref 13.0–17.1)
LYMPH#: 0.8 10*3/uL — AB (ref 0.9–3.3)
LYMPH%: 18.5 % (ref 14.0–49.0)
MCH: 30.5 pg (ref 27.2–33.4)
MCHC: 33.3 g/dL (ref 32.0–36.0)
MCV: 91.5 fL (ref 79.3–98.0)
MONO#: 0.9 10*3/uL (ref 0.1–0.9)
MONO%: 20.5 % — ABNORMAL HIGH (ref 0.0–14.0)
NEUT%: 56.6 % (ref 39.0–75.0)
NEUTROS ABS: 2.4 10*3/uL (ref 1.5–6.5)
PLATELETS: 116 10*3/uL — AB (ref 140–400)
RBC: 4.32 10*6/uL (ref 4.20–5.82)
RDW: 18 % — ABNORMAL HIGH (ref 11.0–14.6)
WBC: 4.3 10*3/uL (ref 4.0–10.3)

## 2015-09-28 MED ORDER — DEXTROSE 5 % IV SOLN
Freq: Once | INTRAVENOUS | Status: AC
  Administered 2015-09-28: 13:00:00 via INTRAVENOUS

## 2015-09-28 MED ORDER — DEXTROSE 5 % IV SOLN
400.0000 mg/m2 | Freq: Once | INTRAVENOUS | Status: AC
  Administered 2015-09-28: 876 mg via INTRAVENOUS
  Filled 2015-09-28: qty 43.8

## 2015-09-28 MED ORDER — PALONOSETRON HCL INJECTION 0.25 MG/5ML
0.2500 mg | Freq: Once | INTRAVENOUS | Status: AC
  Administered 2015-09-28: 0.25 mg via INTRAVENOUS

## 2015-09-28 MED ORDER — OXALIPLATIN CHEMO INJECTION 100 MG/20ML
60.0000 mg/m2 | Freq: Once | INTRAVENOUS | Status: AC
  Administered 2015-09-28: 130 mg via INTRAVENOUS
  Filled 2015-09-28: qty 26

## 2015-09-28 MED ORDER — FLUOROURACIL CHEMO INJECTION 2.5 GM/50ML
400.0000 mg/m2 | Freq: Once | INTRAVENOUS | Status: AC
  Administered 2015-09-28: 900 mg via INTRAVENOUS
  Filled 2015-09-28: qty 18

## 2015-09-28 MED ORDER — SODIUM CHLORIDE 0.9 % IV SOLN
2400.0000 mg/m2 | INTRAVENOUS | Status: DC
  Administered 2015-09-28: 5250 mg via INTRAVENOUS
  Filled 2015-09-28: qty 105

## 2015-09-28 MED ORDER — PALONOSETRON HCL INJECTION 0.25 MG/5ML
INTRAVENOUS | Status: AC
  Filled 2015-09-28: qty 5

## 2015-09-28 MED ORDER — FOSAPREPITANT DIMEGLUMINE INJECTION 150 MG
Freq: Once | INTRAVENOUS | Status: AC
  Administered 2015-09-28: 13:00:00 via INTRAVENOUS
  Filled 2015-09-28: qty 5

## 2015-09-28 NOTE — Patient Instructions (Signed)
[facility] Discharge Instructions for Patients Receiving Chemotherapy  Today you received the following chemotherapy agents Oxaliplatin, Leucovorin, and 5FU  To help prevent nausea and vomiting after your treatment, we encourage you to take your nausea medication Compazine 10 mg every 6 hours as needed   If you develop nausea and vomiting that is not controlled by your nausea medication, call the clinic.   BELOW ARE SYMPTOMS THAT SHOULD BE REPORTED IMMEDIATELY:  *FEVER GREATER THAN 100.5 F  *CHILLS WITH OR WITHOUT FEVER  NAUSEA AND VOMITING THAT IS NOT CONTROLLED WITH YOUR NAUSEA MEDICATION  *UNUSUAL SHORTNESS OF BREATH  *UNUSUAL BRUISING OR BLEEDING  TENDERNESS IN MOUTH AND THROAT WITH OR WITHOUT PRESENCE OF ULCERS  *URINARY PROBLEMS  *BOWEL PROBLEMS  UNUSUAL RASH Items with * indicate a potential emergency and should be followed up as soon as possible.  Feel free to call the clinic you have any questions or concerns. The clinic phone number is [phone].  Please show the [place] at check-in to the Emergency Department and triage nurse.

## 2015-09-28 NOTE — Progress Notes (Signed)
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 He completed cycle 4 FOLFOX 09/14/2015. He denies nausea/vomiting. No mouth sores. No diarrhea. He has had recent constipation. He is drinking prune juice and started a laxative. Cold sensitivity lasted about 4 days. No persistent neuropathy symptoms. He notes decreased right-sided neck pain.  Objective:  Vital signs in last 24 hours:  Blood pressure 120/77, pulse 100, temperature 98.7 F (37.1 C), temperature source Oral, resp. rate 18, height 6' (1.829 m), weight 206 lb (93.441 kg), SpO2 99 %.    HEENT: No thrush or ulcers. Resp: Lungs clear bilaterally. Cardio: Regular rate and rhythm. GI: Abdomen soft and nontender. No hepatomegaly. Vascular: No leg edema. Neuro: Vibratory sense intact to minimally decreased over the fingertips per tuning fork exam.  Port-A-Cath without erythema.   Lab Results:  Lab Results  Component Value Date   WBC 4.3 09/28/2015   HGB 13.2 09/28/2015   HCT 39.5 09/28/2015   MCV 91.5 09/28/2015   PLT 116* 09/28/2015   NEUTROABS 2.4 09/28/2015    Imaging:  No results found.  Medications: I have reviewed the patient's current medications.  Assessment/Plan: 1.Adenocarcinoma of the distal esophagus/gastric cardia-status post an endoscopic biopsy on 03/18/2013 confirming adenocarcinoma, HER-2/neu not amplified  -Staging CT scans 03/20/2013 confirmed a distal esophageal/gastric cardia mass, distal paraesophageal lymph nodes and paratracheal nodes concerning for metastatic lymphadenopathy  -A PET scan 03/27/2013 with a multifocal area of hypermetabolic activity involving the thoracic esophagus extending into the gastric cardia and hypermetabolic mediastinal nodes, no distant metastatic disease  -Initiation of radiation 04/07/2013 and weekly Taxol/carboplatin 04/08/2013, radiation completed 05/15/2013  -Restaging  PET scan 06/09/2013-no evidence of metastatic disease, no hypermetabolic lymphadenopathy  -Esophagogastrectomy 07/07/2013 revealed a ypT1b,ypN0 tumor with negative surgical margins  -CT neck 06/09/2014 revealed a necrotic lymph node in the right tracheoesophageal groove  -PET scan 11/15/2014 revealed a single focus of hypermetabolism corresponding to the necrotic right upper mediastinum lymph node -EUS biopsy of the paraesophageal lymph node 06/25/2012 confirmed metastatic adenocarcinoma  -Radiation completed 08/11/2014 -Rising CEA May 2016 -PET scan 07/13/2015 with hypermetabolic right paratracheal and right hilar adenopathy -Cycle 1 FOLFOX 08/03/2015 -Cycle 2 FOLFOX 08/17/2015 - cycle 3 FOLFOX 08/31/2015 (oxaliplatin dose reduced secondary to thrombocytopenia)  -Cycle 4 FOLFOX 09/14/2015 -Cycle 5 FOLFOX 09/28/2015   2. Indeterminate 15 mm right liver lesion on the CT scan 03/20/2013, MRI of the liver 06/09/2013 confirmed a right liver hemangioma  3. Anorexia/weight loss and solid dysphagia secondary to #1 -improved 4. History of a colon polyp, status post a polypectomy October 2013  5. history of Thrombocytopenia secondary chemotherapy-the carboplatin wase dose reduced with week 5 of chemotherapy chemotherapy  6. 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 Esophageal stricture-status post dilation 06/08/2015 8. Port-A-Cath placement 07/20/2015 9. Delayed nausea following cycle 1 FOLFOX. Aloxi and Emend added with cycle 2. 10. Thrombocytopenia secondary to chemotherapy  11. 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 He has completed 4 cycles of FOLFOX. Plan to proceed with cycle 5 today as scheduled.  He is  scheduled for restaging CT scans of the neck and chest 10/08/2015. He will return for a follow-up visit on 10/12/2015. He will contact the office in the interim with any  problems.    Joel, Osborne ANP/GNP-BC   09/28/2015  12:10 PM

## 2015-09-29 ENCOUNTER — Encounter (HOSPITAL_COMMUNITY): Payer: Self-pay | Admitting: *Deleted

## 2015-09-29 ENCOUNTER — Telehealth: Payer: Self-pay | Admitting: *Deleted

## 2015-09-29 ENCOUNTER — Telehealth: Payer: Self-pay | Admitting: Gastroenterology

## 2015-09-29 ENCOUNTER — Encounter (HOSPITAL_COMMUNITY)
Admission: EM | Disposition: A | Discharge status: Home / Self Care | Payer: Self-pay | Source: Home / Self Care | Attending: Emergency Medicine

## 2015-09-29 ENCOUNTER — Emergency Department (HOSPITAL_COMMUNITY)
Admission: EM | Admit: 2015-09-29 | Discharge: 2015-09-30 | Disposition: A | Discharge status: Home / Self Care | Payer: BLUE CROSS/BLUE SHIELD | Attending: Emergency Medicine | Admitting: Emergency Medicine

## 2015-09-29 DIAGNOSIS — Z791 Long term (current) use of non-steroidal anti-inflammatories (NSAID): Secondary | ICD-10-CM | POA: Diagnosis not present

## 2015-09-29 DIAGNOSIS — R1314 Dysphagia, pharyngoesophageal phase: Secondary | ICD-10-CM

## 2015-09-29 DIAGNOSIS — K219 Gastro-esophageal reflux disease without esophagitis: Secondary | ICD-10-CM | POA: Diagnosis not present

## 2015-09-29 DIAGNOSIS — K222 Esophageal obstruction: Secondary | ICD-10-CM | POA: Diagnosis not present

## 2015-09-29 DIAGNOSIS — E785 Hyperlipidemia, unspecified: Secondary | ICD-10-CM | POA: Diagnosis not present

## 2015-09-29 DIAGNOSIS — Z923 Personal history of irradiation: Secondary | ICD-10-CM | POA: Insufficient documentation

## 2015-09-29 DIAGNOSIS — Z8501 Personal history of malignant neoplasm of esophagus: Secondary | ICD-10-CM | POA: Insufficient documentation

## 2015-09-29 DIAGNOSIS — T180XXA Foreign body in mouth, initial encounter: Secondary | ICD-10-CM | POA: Diagnosis present

## 2015-09-29 DIAGNOSIS — Z800 Family history of malignant neoplasm of digestive organs: Secondary | ICD-10-CM | POA: Diagnosis not present

## 2015-09-29 DIAGNOSIS — G4730 Sleep apnea, unspecified: Secondary | ICD-10-CM | POA: Diagnosis not present

## 2015-09-29 DIAGNOSIS — E119 Type 2 diabetes mellitus without complications: Secondary | ICD-10-CM | POA: Insufficient documentation

## 2015-09-29 DIAGNOSIS — X58XXXA Exposure to other specified factors, initial encounter: Secondary | ICD-10-CM | POA: Diagnosis not present

## 2015-09-29 DIAGNOSIS — Z79899 Other long term (current) drug therapy: Secondary | ICD-10-CM | POA: Insufficient documentation

## 2015-09-29 DIAGNOSIS — T18128A Food in esophagus causing other injury, initial encounter: Secondary | ICD-10-CM | POA: Diagnosis not present

## 2015-09-29 HISTORY — PX: ESOPHAGOGASTRODUODENOSCOPY: SHX5428

## 2015-09-29 LAB — CEA: CEA: 86 ng/mL — AB (ref 0.0–5.0)

## 2015-09-29 SURGERY — EGD (ESOPHAGOGASTRODUODENOSCOPY)
Anesthesia: Moderate Sedation

## 2015-09-29 MED ORDER — MIDAZOLAM HCL 10 MG/2ML IJ SOLN
INTRAMUSCULAR | Status: DC | PRN
  Administered 2015-09-29 (×5): 2 mg via INTRAVENOUS

## 2015-09-29 MED ORDER — MIDAZOLAM HCL 5 MG/ML IJ SOLN
INTRAMUSCULAR | Status: AC
  Filled 2015-09-29: qty 2

## 2015-09-29 MED ORDER — GLUCAGON HCL RDNA (DIAGNOSTIC) 1 MG IJ SOLR
1.0000 mg | Freq: Once | INTRAMUSCULAR | Status: AC
  Administered 2015-09-29: 1 mg via INTRAMUSCULAR
  Filled 2015-09-29: qty 1

## 2015-09-29 MED ORDER — FENTANYL CITRATE (PF) 100 MCG/2ML IJ SOLN
INTRAMUSCULAR | Status: DC | PRN
  Administered 2015-09-29 (×4): 25 ug via INTRAVENOUS

## 2015-09-29 MED ORDER — FENTANYL CITRATE (PF) 100 MCG/2ML IJ SOLN
INTRAMUSCULAR | Status: AC
  Filled 2015-09-29: qty 2

## 2015-09-29 NOTE — Telephone Encounter (Signed)
Informed pt of CEA result. He voiced understanding.

## 2015-09-29 NOTE — ED Notes (Signed)
Bed: RESA Expected date:  Expected time:  Means of arrival:  Comments: Endo at bedside

## 2015-09-29 NOTE — Interval H&P Note (Signed)
History and Physical Interval Note:  09/29/2015 10:44 PM  Joel Osborne  has presented today for surgery, with the diagnosis of dysphagia, food impaction  The various methods of treatment have been discussed with the patient and family. After consideration of risks, benefits and other options for treatment, the patient has consented to  Procedure(s): ESOPHAGOGASTRODUODENOSCOPY (EGD) (N/A) as a surgical intervention .  The patient's history has been reviewed, patient examined, no change in status, stable for surgery.  I have reviewed the patient's chart and labs.  Questions were answered to the patient's satisfaction.     Pricilla Riffle. Fuller Plan

## 2015-09-29 NOTE — Telephone Encounter (Signed)
Pt with h/o esophageal stricture, esophageal cancer, prior esophagectomy and reports worsening dysphagia with solids for weeks. Today at 1100 he had acute inability to swallow while eating a hamburger. Advised to immediately go to [facility] ED for evaluation.

## 2015-09-29 NOTE — ED Notes (Signed)
Pt unable to take small sips of water through straw after Glucagon injection. PA-C made aware of same.

## 2015-09-29 NOTE — H&P (View-Only) (Signed)
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 He completed another cycle of FOLFOX 08/17/2015. He reports less nausea following this cycle of chemotherapy. Good appetite. He has discomfort at the right side of the neck. He is working. No diarrhea. His wife reports he has emotional outbursts following chemotherapy. This has occurred on day 3 following the last 2 cycles of chemotherapy. He states that he has noted increased emotions. He has cold sensitivity following chemotherapy. No significant numbness at present.   Objective:  Vital signs in last 24 hours:  Blood pressure 130/71, pulse 84, temperature 98.9 F (37.2 C), temperature source Oral, resp. rate 18, height 6' (1.829 m), weight 206 lb (93.441 kg), SpO2 97 %.    HEENT:  no thrush or ulcers, tender at the right upper neck near the angle of the jaw. No mass. Lymphatics:  no cervical or supraclavicular nodes Resp:  lungs clear bilaterally Cardio:  regular rate and rhythm GI:  no hepatosplenomegaly, nontender, no mass Vascular:  no leg edema Neurologic: Mild decrease in vibratory sense at the fingertips bilaterally     Portacath/PICC-without erythema  Lab Results:  Lab Results  Component Value Date   WBC 4.5 08/31/2015   HGB 13.5 08/31/2015   HCT 40.0 08/31/2015   MCV 89.7 08/31/2015   PLT 77* 08/31/2015   NEUTROABS 2.6 08/31/2015      Lab Results  Component Value Date   CEA 122.5* 08/03/2015     Medications: I have reviewed the patient's current medications.  Assessment/Plan: 1.Adenocarcinoma of the distal esophagus/gastric cardia-status post an endoscopic biopsy on 03/18/2013 confirming adenocarcinoma, HER-2/neu not amplified  -Staging CT scans 03/20/2013 confirmed a distal esophageal/gastric cardia mass, distal paraesophageal lymph nodes and paratracheal nodes concerning for metastatic lymphadenopathy  -A PET scan  03/27/2013 with a multifocal area of hypermetabolic activity involving the thoracic esophagus extending into the gastric cardia and hypermetabolic mediastinal nodes, no distant metastatic disease  -Initiation of radiation 04/07/2013 and weekly Taxol/carboplatin 04/08/2013, radiation completed 05/15/2013  -Restaging PET scan 06/09/2013-no evidence of metastatic disease, no hypermetabolic lymphadenopathy  -Esophagogastrectomy 07/07/2013 revealed a ypT1b,ypN0 tumor with negative surgical margins  -CT neck 06/09/2014 revealed a necrotic lymph node in the right tracheoesophageal groove  -PET scan 11/15/2014 revealed a single focus of hypermetabolism corresponding to the necrotic right upper mediastinum lymph node -EUS biopsy of the paraesophageal lymph node 06/25/2012 confirmed metastatic adenocarcinoma  -Radiation completed 08/11/2014 -Rising CEA May 2016 -PET scan 07/13/2015 with hypermetabolic right paratracheal and right hilar adenopathy -Cycle 1 FOLFOX 08/03/2015 -Cycle 2 FOLFOX 08/17/2015 - cycle 3 FOLFOX 08/31/2015 (oxaliplatin dose reduced secondary to thrombocytopenia) disease    2. Indeterminate 15 mm right liver lesion on the CT scan 03/20/2013 , MRI of the liver 06/09/2013 confirmed a right liver hemangioma  3. Anorexia/weight loss and solid dysphagia secondary to #1 -improved 4. History of a colon polyp, status post a polypectomy October 2013  5. history of Thrombocytopenia secondary chemotherapy-the carboplatin wase dose reduced with week 5 of chemotherapy chemotherapy  6. 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 Esophageal stricture-status post dilation 06/08/2015 8. Port-A-Cath placement 07/20/2015 9. Delayed nausea following cycle 1 FOLFOX. Aloxi and Emend added with cycle 2. 10.  thrombocytopenia secondary to chemotherapy  11. Emotional lability/depression-prophylactic Decadron will be discontinued and he  will begin Remeron     Disposition:   He is completed 2 cycles of FOLFOX. He has moderate thrombocytopenia today. I recommended delaying chemotherapy for 1 week, but he would very much like to proceed with treatment today. We decided to dose reduce the oxaliplatin and proceed with cycle 3 FOLFOX today. He understands the increased risk of bleeding with the moderate thrombocytopenia. He will contact us for spontaneous bleeding or bruising and he will return next week for a CBC.  The emotional lability may be related to situational depression/anxiety. Decadron may be contributing. We will eliminate the home prophylactic Decadron with this cycle. He will begin a trial of Remeron. He declined a psychology referral.  He will return for an office visit and chemotherapy in 2 weeks. We will follow-up on the CEA from today.  Betsy Coder, MD  08/31/2015  12:16 PM

## 2015-09-29 NOTE — ED Notes (Signed)
PT states that he ate a hamburger patty this morning and that some feels lodged in his throat; pt states that he has had this happen before; pt is spitting his saliva in triage; pt states that he feels like he cannot get anything down; pt able to talk in full and complete sentences; no respiratory distress; pt states that he got some of the hamburger up earlier but still feels like some is lodged; pt states that he has not been able to eat anything since the incident

## 2015-09-29 NOTE — ED Provider Notes (Signed)
CSN: [id number]     Arrival date & time 09/29/15  1908 History   First MD Initiated Contact with Patient 09/29/15 1930     Chief Complaint  Patient presents with  . Food Bolus to Throat    Joel Osborne is a 49 y.o. male with a history of esophageal cancer on chemotherapy, with history of previous radiation and previous food impaction who presents to the ED complaining of feeling that food is stuck in his throat. The patient reports on 11 AM today he was eating a hamburger when he feels this became lodged in his throat. He reports he was able to spit up a small amount of food at the time but still feels there is something lodged in his esophagus. He reports he is been spitting up his secretions. He does report he was able to swallow a small amount of water prior to arrival. The patient is followed by gastroenterologist Dr. Scarlette Shorts. His history of previous impactions and previous esophageal dilations. He is followed by oncologist Dr. Malachy Mood. He is currently undergoing chemotherapy and has a port in his right chest. The patient denies abdominal pain, nausea, vomiting, fevers, recent illness, chest pain, shortness of breath.  (Consider location/radiation/quality/duration/timing/severity/associated sxs/prior Treatment) HPI  Past Medical History  Diagnosis Date  . Hyperlipemia   . Colon polyp   . Pre-diabetes   . GERD (gastroesophageal reflux disease)   . Esophageal cancer ([place]) 03/18/2013    Adenocarcinoma  . History of radiation therapy 04/07/13-05-15-13    Esopageal ca,50.4Gy/69f and [place]  . Diabetes mellitus without complication [place]     patient denies.  . Sleep apnea     no c-pap at this time  . Anxiety    Past Surgical History  Procedure Laterality Date  . Appendectomy    . Lipoma excision    . Edg  03/18/2013    Esophageal Mass  . Complete esophagectomy N/A 07/07/2013    Procedure: ESOPHAGECTOMY COMPLETE;  Surgeon: EGrace Isaac MD;  [facility]   Service: Thoracic;  Laterality: N/A;  transhiatel esophagectomy, feeding jejunostomy  . Video bronchoscopy N/A 07/07/2013    Procedure: VIDEO BRONCHOSCOPY;  Surgeon: EGrace Isaac MD;  [place] [facility]  Service: Thoracic;  Laterality: N/A;  . Eus N/A 06/25/2014    Procedure: UPPER ENDOSCOPIC ULTRASOUND (EUS) LINEAR;  Surgeon: DMilus Banister MD;  Location: [facility];  Service: Endoscopy;  Laterality: N/A;  . Esophagogastroduodenoscopy N/A 07/02/2014    Procedure: ESOPHAGOGASTRODUODENOSCOPY (EGD);  Surgeon: CGatha Mayer MD;  [facility];  Service: Endoscopy;  Laterality: N/A;  . Vocal chord surgery      pushed right vocal cord more centered- September 2015  . Esophagogastroduodenoscopy N/A 02/16/2015    Procedure: ESOPHAGOGASTRODUODENOSCOPY (EGD);  Surgeon: DLafayette Dragon MD;  [facility];  Service: Endoscopy;  Laterality: N/A;  . Upper gastrointestinal endoscopy    . Colonoscopy     Family History  Problem Relation Age of Onset  . Colon cancer Mother     dx in her 557s . Diabetes Mother   . Breast cancer Maternal Aunt 81  . Diabetes Maternal Aunt   . Esophageal cancer Maternal Aunt 78  . Kidney failure Brother 431 . Stomach cancer Neg Hx   . Rectal cancer Neg Hx   . Hypertension Brother 474  Social History  Substance Use Topics  . Smoking status: Never Smoker   . Smokeless tobacco: Never Used  .  Alcohol Use: 1.2 oz/week    2 Cans of beer per week    Review of Systems  Constitutional: Negative for fever.  HENT: Positive for trouble swallowing. Negative for voice change.   Eyes: Negative for visual disturbance.  Respiratory: Negative for cough, shortness of breath and wheezing.   Gastrointestinal: Negative for nausea, vomiting and abdominal pain.  Skin: Negative for rash.  Neurological: Negative for syncope and light-headedness.      Allergies  Lactose intolerance (gi)  Home Medications   Prior to Admission medications   Medication Sig  Start Date End Date Taking? Authorizing Provider  acetaminophen (TYLENOL) 500 MG tablet Take 1,000 mg by mouth every 6 (six) hours as needed for mild pain or moderate pain.   Yes Historical Provider, MD  eszopiclone (LUNESTA) 1 MG TABS tablet Take 1 mg by mouth at bedtime as needed for sleep (Take 1-2 tablets at bedtime). Take immediately before bedtime   Yes Historical Provider, MD  ibuprofen (ADVIL,MOTRIN) 200 MG tablet Take 400 mg by mouth every 6 (six) hours as needed for moderate pain (soreness).   Yes Historical Provider, MD  lactase (LACTAID) 3000 UNITS tablet Take 3,000 Units by mouth daily as needed (lactose intolerance). For lactose intolerance   Yes Historical Provider, MD  lidocaine-prilocaine (EMLA) cream Apply 1 application topically as needed. Apply tablespoon to PAC 2 hours prior to stick and cover with plastic wrap 07/19/15  Yes Ladell Pier, MD  Multiple Vitamin (MULTIVITAMIN WITH MINERALS) TABS tablet Take 1 tablet by mouth daily.   Yes Historical Provider, MD  ondansetron (ZOFRAN) 8 MG tablet Take 1 tablet (8 mg total) by mouth every 8 (eight) hours as needed for nausea or vomiting. Patient taking differently: Take 8 mg by mouth every 8 (eight) hours as needed for nausea or vomiting (takes at 7am in the morning).  08/06/15  Yes Ladell Pier, MD  prochlorperazine (COMPAZINE) 10 MG tablet Take 1 tablet (10 mg total) by mouth every 6 (six) hours as needed for nausea. Patient taking differently: Take 10 mg by mouth every 6 (six) hours as needed for nausea (takes at 7pm in the evening).  07/19/15  Yes Ladell Pier, MD  zolpidem (AMBIEN) 10 MG tablet Take 1 tablet (10 mg total) by mouth at bedtime as needed for sleep. 09/14/15  Yes Owens Shark, NP  mirtazapine (REMERON) 15 MG tablet Take 1 tablet (15 mg total) by mouth at bedtime. Patient not taking: Reported on 09/13/2015 08/31/15   Ladell Pier, MD   BP 113/67 mmHg  Pulse 89  Temp(Src) 98.3 F (36.8 C) (Oral)  Resp 16  SpO2  97% Physical Exam  Constitutional: He is oriented to person, place, and time. He appears well-developed and well-nourished. No distress.  Nontoxic appearing.  HENT:  Head: Normocephalic and atraumatic.  Mouth/Throat: Oropharynx is clear and moist.  Oropharynx is clear. No secretions in the patient's mouth. He is handling secretions without difficulty. No spitting in the room. His bases but clear and coherent. He is speaking in full sentences.  Eyes: Conjunctivae are normal. Pupils are equal, round, and reactive to light. Right eye exhibits no discharge. Left eye exhibits no discharge.  Neck: Neck supple.  Cardiovascular: Normal rate, regular rhythm, normal heart sounds and intact distal pulses.  Exam reveals no gallop and no friction rub.   No murmur heard. Pulmonary/Chest: Effort normal and breath sounds normal. No respiratory distress. He has no wheezes. He has no rales.  Port to right  chest.  Abdominal: Soft. Bowel sounds are normal. He exhibits no distension. There is no tenderness. There is no rebound and no guarding.  Abdomen soft and nontender to palpation.  Musculoskeletal: He exhibits no edema.  Lymphadenopathy:    He has no cervical adenopathy.  Neurological: He is alert and oriented to person, place, and time. Coordination normal.  Skin: Skin is warm and dry. No rash noted. He is not diaphoretic. No erythema. No pallor.  Psychiatric: He has a normal mood and affect. His behavior is normal.  Nursing note and vitals reviewed.   ED Course  Procedures (including critical care time) Labs Review Labs Reviewed - No data to display  Imaging Review No results found.    EKG Interpretation None      Filed Vitals:   09/29/15 2325 09/29/15 2330 09/29/15 2335 09/30/15 0012  BP: 119/61 103/53 108/56 113/67  Pulse: 85 80 79 89  Temp:      TempSrc:      Resp: '20 14 15 16  '$ SpO2: 97% 98% 91% 97%     MDM   Meds given in ED:  Medications  glucagon (human recombinant)  (GLUCAGEN) injection 1 mg (1 mg Intramuscular Given 09/29/15 2031)    Current Discharge Medication List      Final diagnoses:  Food impaction of esophagus, initial encounter   This is a 49 y.o. male with a history of esophageal cancer on chemotherapy, with history of previous radiation and previous food impaction who presents to the ED complaining of feeling that food is stuck in his throat. The patient reports on 11 AM today he was eating a hamburger when he feels this became lodged in his throat. He reports he was able to spit up a small amount of food at the time but still feels there is something lodged in his esophagus. He reports he is been spitting up his secretions. He does report he was able to swallow a small amount of water prior to arrival. The patient is followed by gastroenterologist Dr. Scarlette Shorts. His history of previous impactions and previous esophageal dilations.  On exam the patient is afebrile nontoxic appearing. There is no obvious food impaction on visual exam. The patient is not spitting his secretions or drooling during exam. His lungs are clear to auscultation bilaterally. I called and consulted with gastroenterologist Dr. Fuller Plan who would like me to try glucagon and have him take a sip of water through a straw the CT scan keep this down. I reevaluated the patient after glucagon and had him try sips of water through a straw. He felt like this was not going down spit the water immediately back up. I called Dr. Fuller Plan again advised him of this finding. He reports he'll begin with the endoscopy team to scope him shortly. Dr. Fuller Plan performed an endoscopy and found there to be hamburger in the patient's esophagus. I was told by nursing staff that the procedure went well. I reevaluated the patient who informs me he is feeling much better and he is handling secretions without difficulty. I will discharge the patient with follow-up with gastroenterology. I advised her to return  precautions. I advised the patient to follow-up with their primary care provider this week. I advised the patient to return to the emergency department with new or worsening symptoms or new concerns. The patient verbalized understanding and agreement with plan.    This patient was discussed with and evaluated by Dr. Alfonse Spruce who agrees with assessment and plan.  Waynetta Pean, PA-C 09/30/15 0041  Harvel Quale, MD 09/30/15 [phone]

## 2015-09-29 NOTE — Telephone Encounter (Signed)
-----   Message from Ladell Pier, MD sent at 09/29/2015  1:46 PM EDT ----- Please call patient, cea is lower

## 2015-09-30 ENCOUNTER — Ambulatory Visit (HOSPITAL_BASED_OUTPATIENT_CLINIC_OR_DEPARTMENT_OTHER): Payer: BLUE CROSS/BLUE SHIELD

## 2015-09-30 ENCOUNTER — Encounter (HOSPITAL_COMMUNITY): Payer: Self-pay | Admitting: Gastroenterology

## 2015-09-30 VITALS — BP 115/74 | HR 91 | Temp 98.8°F | Resp 18

## 2015-09-30 DIAGNOSIS — C155 Malignant neoplasm of lower third of esophagus: Secondary | ICD-10-CM

## 2015-09-30 MED ORDER — HEPARIN SOD (PORK) LOCK FLUSH 100 UNIT/ML IV SOLN
500.0000 [IU] | Freq: Once | INTRAVENOUS | Status: AC | PRN
  Administered 2015-09-30: 500 [IU]
  Filled 2015-09-30: qty 5

## 2015-09-30 MED ORDER — HEPARIN SOD (PORK) LOCK FLUSH 100 UNIT/ML IV SOLN
250.0000 [IU] | Freq: Once | INTRAVENOUS | Status: DC | PRN
  Filled 2015-09-30: qty 5

## 2015-09-30 NOTE — Op Note (Signed)
[facility] [place], [address]   ENDOSCOPY PROCEDURE REPORT  PATIENT: Joel Osborne, Joel Osborne  MR#: [id number] BIRTHDATE: Apr 10, 1966 , 48  yrs. old GENDER: male ENDOSCOPIST: Ladene Artist, MD, [facility] PROCEDURE DATE:  09/29/2015 PROCEDURE:  EGD w/ fb removal ASA CLASS:     Class II INDICATIONS:  dysphagia and acute food impaction at 11 am today while eating a hamburger. MEDICATIONS: Fentanyl 100 mcg IV and Versed 10 mg IV TOPICAL ANESTHETIC: Cetacaine Spray DESCRIPTION OF PROCEDURE: After the risks benefits and alternatives of the procedure were thoroughly explained, informed consent was obtained.  The Gallant [id number] endoscope was introduced through the mouth and advanced to the second portion of the duodenum , Without limitations.  The instrument was slowly withdrawn as the mucosa was fully examined.    ESOPHAGUS: There was a large volume residual food in the proximal esophagus. Some of the food was removed with a net and tripod grasper and the lumen was visualized. The remainder of the food was gently advanced into the stomach and the food impaction was completely cleared. There was a friable stricture at the esopho-gastric anastamosis at 25 cm from the incisors.  The stricture was traversable. There was an erosion just proximal to the stricture.  The remaining esophagus otherwise appeared normal.  STOMACH: The stomach otherwise appeared normal post surgery. DUODENUM: The duodenal mucosa showed no abnormalities in the bulb and 2nd part of the duodenum.  Retroflexed views revealed as previously described prior esophagectomy.   The scope was then withdrawn from the patient and the procedure completed.  COMPLICATIONS: There were no immediate complications.  ENDOSCOPIC IMPRESSION: 1.   Large volume of residual food in the proximal esophagus; removed 2.   Friable stricture and erosion at the esopho-gastric anastamosis  3.   Prior  esophagectomy  RECOMMENDATIONS: 1.  Anti-reflux regimen 2.  Continue PPI 3.  EGD with dilation as scheduled with Dr. Henrene Pastor 4.  Clear liquids until tomorrow morning and then completely avoid any food that needs to be chewed until seen by Dr. Henrene Pastor  eSigned:  Ladene Artist, MD, [facility] 09/30/2015 9:04 AM    [place]:[facility] Henrene Pastor, MD

## 2015-09-30 NOTE — ED Notes (Signed)
Pt alert and oriented, at baseline, 97% on room air, in NAD at time of d/c.

## 2015-09-30 NOTE — ED Notes (Signed)
Pt appears to be resting, arouses to name, respirations even and unlabored, skin warm and dry, in NAD, son at bedside, will continue to monitor.

## 2015-09-30 NOTE — Discharge Instructions (Signed)
Esophagogastroduodenoscopy Esophagogastroduodenoscopy (EGD) is a procedure that is used to examine the lining of the esophagus, stomach, and first part of the small intestine (duodenum). A long, flexible, lighted tube with a camera attached (endoscope) is inserted down the throat to view these organs. This procedure is done to detect problems or abnormalities, such as inflammation, bleeding, ulcers, or growths, in order to treat them. The procedure lasts 5-20 minutes. It is usually an outpatient procedure, but it may need to be performed in a hospital in emergency cases. LET [facility] CARE PROVIDER KNOW ABOUT:  Any allergies you have.  All medicines you are taking, including vitamins, herbs, eye drops, creams, and over-the-counter medicines.  Previous problems you or members of your family have had with the use of anesthetics.  Any blood disorders you have.  Previous surgeries you have had.  Medical conditions you have. RISKS AND COMPLICATIONS Generally, this is a safe procedure. However, problems can occur and include:  Infection.  Bleeding.  Tearing (perforation) of the esophagus, stomach, or duodenum.  Difficulty breathing or not being able to breathe.  Excessive sweating.  Spasms of the larynx.  Slowed heartbeat.  Low blood pressure. BEFORE THE PROCEDURE  Do not eat or drink anything after midnight on the night before the procedure or as directed by your health care provider.  Do not take your regular medicines before the procedure if your health care provider asks you not to. Ask your health care provider about changing or stopping those medicines.  If you wear dentures, be prepared to remove them before the procedure.  Arrange for someone to drive you home after the procedure. PROCEDURE  A numbing medicine (local anesthetic) may be sprayed in your throat for comfort and to stop you from gagging or coughing.  You will have an IV tube inserted in a vein in your  hand or arm. You will receive medicines and fluids through this tube.  You will be given a medicine to relax you (sedative).  A pain reliever will be given through the IV tube.  A mouth guard may be placed in your mouth to protect your teeth and to keep you from biting on the endoscope.  You will be asked to lie on your left side.  The endoscope will be inserted down your throat and into your esophagus, stomach, and duodenum.  Air will be put through the endoscope to allow your health care provider to clearly view the lining of your esophagus.  The lining of your esophagus, stomach, and duodenum will be examined. During the exam, your health care provider may:  Remove tissue to be examined under a microscope (biopsy) for inflammation, infection, or other medical problems.  Remove growths.  Remove objects (foreign bodies) that are stuck.  Treat any bleeding with medicines or other devices that stop tissues from bleeding (hot cautery, clipping devices).  Widen (dilate) or stretch narrowed areas of your esophagus and stomach.  The endoscope will be withdrawn. AFTER THE PROCEDURE  You will be taken to a recovery area for observation. Your blood pressure, heart rate, breathing rate, and blood oxygen level will be monitored often until the medicines you were given have worn off.  Do not eat or drink anything until the numbing medicine has worn off and your gag reflex has returned. You may choke.  Your health care provider should be able to discuss his or her findings with you. It will take longer to discuss the test results if any biopsies were taken.  This information is not intended to replace advice given to you by your health care provider. Make sure you discuss any questions you have with your health care provider.   Document Released: 03/30/2005 Document Revised: 12/18/2014 Document Reviewed: 10/30/2012 Elsevier Interactive Patient Education 2016 [place]  Foreign Body, Adult A swallowed foreign body is an object that gets stuck in the tube that connects your throat to your stomach (esophagus) or in another part of your digestive tract. Foreign bodies may be swallowed by accident or on purpose. When you swallow an object, it passes into your esophagus. The narrowest place in your digestive system is where your esophagus meets your stomach. If the object can pass through that place, it will usually continue through the rest of your digestive system without causing problems. A foreign body that gets stuck may need to be removed. It is very important to tell your health care provider what you have swallowed. Certain swallowed items can be life-threatening. You may need emergency treatment. Dangerous swallowed foreign bodies include: Objects that get stuck in your throat. Objects that interfere with your breathing. Sharp objects. Harmful objects, such as batteries or illegal drugs. CAUSES The most common swallowed foreign body is food that will not pass through your esophagus to your stomach (food impaction). Foods that commonly become impacted include meats and hard vegetables, such as carrots and radishes. Other common swallowed foreign bodies include: Pieces of bone from meats. Toothpicks. Dentures. RISK FACTORS You are more likely to have a swallowed foreign body if: You wear dentures. You have been drinking alcohol or taking drugs. You have a mental health condition. You have a narrowed or scarred area in your digestive tract. SYMPTOMS Pain or pressure in your throat or chest. Not being able to swallow food or liquid. Not being able to swallow your saliva. Choking. Hoarse voice. Trouble breathing. DIAGNOSIS This condition may be diagnosed based on your symptoms and medical history. Your health care provider will do a physical exam to confirm the diagnosis and to find the object. Imaging studies may be done, including: X-rays. A CT  scan. Some objects may not be seen on imaging studies. In those cases, an exam may be done using a long tubelike scope to look into your esophagus (endoscopy). The tube (endoscope) that is used for this exam may be stiff (rigid) or flexible, depending on where the foreign body is stuck. TREATMENT Usually, an object that has passed into your stomach but is not dangerous will pass out of your digestive system without treatment. If the swallowed object is not dangerous but it is stuck in your esophagus: You may be given medicine to relax the muscles of your esophagus to allow the object to pass through. Endoscopy may be done to find and remove the object if it does not pass with medicine. Your health care provider will put medical instruments through the endoscope to remove the object. You may need emergency treatment if: The object is in your esophagus and is causing you to inhale saliva into your lungs (aspirate). The object is in your esophagus and is pressing on your airway. This makes it hard for you to breathe. The object can damage your digestive tract. Some objects that can cause damage include batteries, magnets, sharp objects, and drugs. HOME CARE INSTRUCTIONS If the object in your digestive system is expected to pass: Continue eating what you usually eat, unless your health care provider gives you different instructions. Check your stool after every bowel movement  to see if you have passed the object. Contact your health care provider if the object has not passed after 3 days. If you had endoscopic surgery to remove the foreign body: Follow instructions from your health care provider about caring for yourself after the procedure. Keep all follow-up visits and repeat imaging tests as told by your health care provider. This is important. SEEK MEDICAL CARE IF: You continue to have symptoms of a swallowed foreign body. The object has not passed out of your body after 3 days. SEEK IMMEDIATE  MEDICAL CARE IF: You have a fever. You have pain in your chest or your abdomen. You cough up blood. You have blood in your stool (feces) or your vomit.   This information is not intended to replace advice given to you by your health care provider. Make sure you discuss any questions you have with your health care provider.   Document Released: 05/17/2010 Document Revised: 08/18/2015 Document Reviewed: 02/24/2015 Elsevier Interactive Patient Education [employer].

## 2015-09-30 NOTE — Patient Instructions (Signed)

## 2015-10-01 ENCOUNTER — Telehealth: Payer: Self-pay | Admitting: Nurse Practitioner

## 2015-10-01 NOTE — Telephone Encounter (Signed)
-----   Message from Owens Shark, NP sent at 09/28/2015 12:36 PM EDT ----- He is scheduled for restaging CT scans 10/08/2015. Please call radiology and asked them to compare to the outside films from August that have been loaded into the system. Thank you

## 2015-10-01 NOTE — Telephone Encounter (Signed)
As per Owens Shark call placed to [facility] at [facility] to relay message that she would like the radiologist to compare the outside films from August to be compared with new films and to be loaded in the system. Spoke with Jinny Blossom in [place].

## 2015-10-05 ENCOUNTER — Telehealth: Payer: Self-pay | Admitting: *Deleted

## 2015-10-05 NOTE — Telephone Encounter (Signed)
Yes he does. Needs dilation. Thank you for asking, however

## 2015-10-05 NOTE — Telephone Encounter (Signed)
Pt called in wanting instructions for prep, advised pt there is no bowel prep with an EGD, nothing after midnight except clear liquids until 0430, after that nothing else by mouth, pt verbalized understaning-adm

## 2015-10-05 NOTE — Telephone Encounter (Signed)
Good morning Dr. Henrene Pastor.  I was prepping charts for tomorrow and saw where this patient was seen in ED on 09/30/15 by Dr.Stark for food impaction and has been diagnosed with Esophageal cancer.  He is scheduled with you for 7:30 in the morning and it was originally scheduled on 09/09/15 by Vaughan Basta for Dysphagia.  Does he still need to come in for an EGD tomorrow.  Please advise.  Thank you.

## 2015-10-06 ENCOUNTER — Encounter: Payer: Self-pay | Admitting: Internal Medicine

## 2015-10-06 ENCOUNTER — Ambulatory Visit (AMBULATORY_SURGERY_CENTER): Payer: BLUE CROSS/BLUE SHIELD | Admitting: Internal Medicine

## 2015-10-06 VITALS — BP 113/74 | HR 64 | Temp 97.5°F | Resp 13 | Ht 72.0 in | Wt 206.0 lb

## 2015-10-06 DIAGNOSIS — R1310 Dysphagia, unspecified: Secondary | ICD-10-CM

## 2015-10-06 DIAGNOSIS — K222 Esophageal obstruction: Secondary | ICD-10-CM

## 2015-10-06 MED ORDER — SODIUM CHLORIDE 0.9 % IV SOLN: 500.0000 mL | INTRAVENOUS | Status: DC

## 2015-10-06 NOTE — Progress Notes (Signed)
  [facility] Post-op Note  Patient: Joel Osborne  Procedure(s) Performed: endoscopy with dilatation  Patient Location: [facility] - Recovery Area  Anesthesia Type: Deep Sedation/Propofol  Level of Consciousness: awake, oriented and patient cooperative  Airway and Oxygen Therapy: Patient Spontanous Breathing  Post-op Pain: none  Post-op Assessment:  Post-op Vital signs reviewed, Patient's Cardiovascular Status Stable, Respiratory Function Stable, Patent Airway, No signs of Nausea or vomiting and Pain level controlled  Post-op Vital Signs: Reviewed and stable  Complications: No apparent anesthesia complications  Jannett Schmall E 8:24 AM

## 2015-10-06 NOTE — Patient Instructions (Signed)
Refer to the procedure report that was given to you for any specific questions about what was found during the examination.  If the procedure report does not answer your questions, please call your gastroenterologist to clarify.  If you requested that your care partneOLLOW DISCHr not be given the details of your procedure findings, then the procedure report has been included in a sealed envelope for you to review at your convenience later.  YOU SHOULD EXPECT: Some feelings of bloating in the abdomen. Passage of more gas than usual.  Walking can help get rid of the air that was put into your GI tract during the procedure and reduce the bloating. If you had a lower endoscopy (such as a colonoscopy or flexible sigmoidoscopy) you may notice spotting of blood in your stool or on the toilet paper. If you underwent a bowel prep for your procedure, you may not have a normal bowel movement for a few days.  Please Note:  You might notice some irritation and congestion in your nose or some drainage.  This is from the oxygen used during your procedure.  There is no need for concern and it should clear up in a day or so.  SYMPTOMS TO REPORT IMMEDIATELY:   Following upper endoscopy (EGD)  Vomiting of blood or coffee ground material  New chest pain or pain under the shoulder blades  Painful or persistently difficult swallowing  New shortness of breath  Fever of 100F or higher  Black, tarry-looking stools  For urgent or emergent issues, a gastroenterologist can be reached at any hour by calling [phone].   DIET: Your first meal following the procedure should be a small meal and then it is ok to progress to your normal diet. Heavy or fried foods are harder to digest and may make you feel nauseous or bloated.  Likewise, meals heavy in dairy and vegetables can increase bloating.  Drink plenty of fluids but you should avoid alcoholic beverages for 24 hours.  ACTIVITY:  You should plan to take it easy for  the rest of today and you should NOT DRIVE or use heavy machinery until tomorrow (because of the sedation medicines used during the test).    FOLLOW UP: Our staff will call the number listed on your records the next business day following your procedure to check on you and address any questions or concerns that you may have regarding the information given to you following your procedure. If we do not reach you, we will leave a message.  However, if you are feeling well and you are not experiencing any problems, there is no need to return our call.  We will assume that you have returned to your regular daily activities without incident.  If any biopsies were taken you will be contacted by phone or by letter within the next 1-3 weeks.  Please call us at [phone] if you have not heard about the biopsies in 3 weeks.    SIGNATURES/CONFIDENTIALITY: You and/or your care partner have signed paperwork which will be entered into your electronic medical record.  These signatures attest to the fact that that the information above on your After Visit Summary has been reviewed and is understood.  Full responsibility of the confidentiality of this discharge information lies with you and/or your care-partner.  Post dilation diet given with instructions Please chew foold extremely well.  Would stay away from meat with fibrous consistency.

## 2015-10-06 NOTE — Op Note (Signed)
[place]  [address]. [place], [address]   ENDOSCOPY PROCEDURE REPORT  PATIENT: Joel Osborne, Joel Osborne  MR#: [id number] BIRTHDATE: January 23, 1966 , 48  yrs. old GENDER: male ENDOSCOPIST: Eustace Quail, MD REFERRED BY:  .Direct Self PROCEDURE DATE:  10/06/2015 PROCEDURE:  EGD with balloon dilation of esophagus   -16, 17, 18 mm ASA CLASS:     Class III INDICATIONS:  dysphagia and therapeutic procedure. Recent meat impaction requiring esophageal extraction.  Marland Kitchen History of esophageal cancer with prior subtotal esophagectomy. Local recurrence of disease, currently undergoing chemotherapy. History of anastomotic stricture requiring dilation. MEDICATIONS: Monitored anesthesia care and Propofol 150 mg IV TOPICAL ANESTHETIC: none  DESCRIPTION OF PROCEDURE: After the risks benefits and alternatives of the procedure were thoroughly explained, informed consent was obtained.  The [id number] endoscope was introduced through the mouth and advanced to the second portion of the duodenum , Without limitations.  The instrument was slowly withdrawn as the mucosa was fully examined.  The very proximal anastomotic stricture was traversed with the endoscope without resistance.  This was slightly friable.  The stomach and duodenum were normal postoperatively.  THERAPY: A TTS balloon was used to sequentially dilate the stricture at diameters of 16, 17, and 18 mm.  Moderate resistance.  Minor heme.  Tolerated well.  Retroflexion was not performed.     The scope was then withdrawn from the patient and the procedure completed.  COMPLICATIONS: There were no immediate complications.  ENDOSCOPIC IMPRESSION: 1. Status post esophagectomy with postoperative anastomotic stricture status post dilation  RECOMMENDATIONS: 1. Clear liquids until 10 am , then soft foods rest iof day.  Resume prior diet tomorrow. 2. Please chew food extremely well. Would stay away from meats with fibrous  consistency  REPEAT EXAM:  eSigned:  Eustace Quail, MD 10/06/2015 8:28 AM    CC:The Patient

## 2015-10-06 NOTE — Progress Notes (Signed)
Patient denies any allergies to eggs or soy. 

## 2015-10-06 NOTE — Progress Notes (Signed)
Called to room to assist during endoscopic procedure.  Patient ID and intended procedure confirmed with present staff. Received instructions for my participation in the procedure from the performing physician.  

## 2015-10-07 ENCOUNTER — Telehealth: Payer: Self-pay | Admitting: *Deleted

## 2015-10-07 NOTE — Telephone Encounter (Signed)
  Follow up Call-  Call back number 10/06/2015 06/30/2015 06/08/2015 10/06/2014 07/17/2014 03/18/2013  Post procedure Call Back phone  # [phone] office [phone]  Permission to leave phone message Yes Yes Yes Yes Yes Yes     Patient questions:  Do you have a fever, pain , or abdominal swelling? No. Pain Score  0 *  Have you tolerated food without any problems? Yes.    Have you been able to return to your normal activities? Yes.    Do you have any questions about your discharge instructions: Diet   No. Medications  No. Follow up visit  No.  Do you have questions or concerns about your Care? No.  Actions: * If pain score is 4 or above: No action needed, pain <4.  Patient states he has "mouth sores" from procedure yesterday. When questioned further he says his lower lip feels like one may "pop out, but hasn't yet". Informed him that it could partly be due to the bite block and his lip may have been slightly pinched. Informed him to call if he felt it was worse or needed to be evaluated and that I would also inform his physician. He is in agreement.

## 2015-10-08 ENCOUNTER — Encounter (HOSPITAL_COMMUNITY): Payer: Self-pay

## 2015-10-08 ENCOUNTER — Ambulatory Visit (HOSPITAL_COMMUNITY)
Admission: RE | Admit: 2015-10-08 | Discharge: 2015-10-08 | Disposition: A | Discharge status: Home / Self Care | Payer: BLUE CROSS/BLUE SHIELD | Source: Ambulatory Visit | Attending: Nurse Practitioner | Admitting: Nurse Practitioner

## 2015-10-08 DIAGNOSIS — R590 Localized enlarged lymph nodes: Secondary | ICD-10-CM | POA: Insufficient documentation

## 2015-10-08 DIAGNOSIS — Z08 Encounter for follow-up examination after completed treatment for malignant neoplasm: Secondary | ICD-10-CM | POA: Insufficient documentation

## 2015-10-08 DIAGNOSIS — R918 Other nonspecific abnormal finding of lung field: Secondary | ICD-10-CM | POA: Insufficient documentation

## 2015-10-08 DIAGNOSIS — K8020 Calculus of gallbladder without cholecystitis without obstruction: Secondary | ICD-10-CM | POA: Diagnosis not present

## 2015-10-08 DIAGNOSIS — C155 Malignant neoplasm of lower third of esophagus: Secondary | ICD-10-CM | POA: Diagnosis present

## 2015-10-08 MED ORDER — IOHEXOL 300 MG/ML  SOLN
100.0000 mL | Freq: Once | INTRAMUSCULAR | Status: AC | PRN
  Administered 2015-10-08: 100 mL via INTRAVENOUS

## 2015-10-11 ENCOUNTER — Other Ambulatory Visit: Payer: Self-pay | Admitting: Oncology

## 2015-10-12 ENCOUNTER — Other Ambulatory Visit (HOSPITAL_BASED_OUTPATIENT_CLINIC_OR_DEPARTMENT_OTHER): Payer: BLUE CROSS/BLUE SHIELD

## 2015-10-12 ENCOUNTER — Telehealth: Payer: Self-pay | Admitting: Oncology

## 2015-10-12 ENCOUNTER — Ambulatory Visit (HOSPITAL_BASED_OUTPATIENT_CLINIC_OR_DEPARTMENT_OTHER): Payer: BLUE CROSS/BLUE SHIELD

## 2015-10-12 ENCOUNTER — Ambulatory Visit (HOSPITAL_BASED_OUTPATIENT_CLINIC_OR_DEPARTMENT_OTHER): Payer: BLUE CROSS/BLUE SHIELD | Admitting: Oncology

## 2015-10-12 VITALS — BP 121/79 | HR 100 | Temp 98.5°F | Resp 20 | Ht 72.0 in | Wt 200.5 lb

## 2015-10-12 DIAGNOSIS — D696 Thrombocytopenia, unspecified: Secondary | ICD-10-CM

## 2015-10-12 DIAGNOSIS — R1310 Dysphagia, unspecified: Secondary | ICD-10-CM

## 2015-10-12 DIAGNOSIS — C155 Malignant neoplasm of lower third of esophagus: Secondary | ICD-10-CM | POA: Diagnosis not present

## 2015-10-12 DIAGNOSIS — Z5111 Encounter for antineoplastic chemotherapy: Secondary | ICD-10-CM | POA: Diagnosis not present

## 2015-10-12 DIAGNOSIS — R634 Abnormal weight loss: Secondary | ICD-10-CM | POA: Diagnosis not present

## 2015-10-12 DIAGNOSIS — R630 Anorexia: Secondary | ICD-10-CM

## 2015-10-12 DIAGNOSIS — Z23 Encounter for immunization: Secondary | ICD-10-CM

## 2015-10-12 LAB — CBC WITH DIFFERENTIAL/PLATELET
BASO%: 1.4 % (ref 0.0–2.0)
Basophils Absolute: 0 10*3/uL (ref 0.0–0.1)
EOS%: 3 % (ref 0.0–7.0)
Eosinophils Absolute: 0.1 10*3/uL (ref 0.0–0.5)
HCT: 39.2 % (ref 38.4–49.9)
HEMOGLOBIN: 13.1 g/dL (ref 13.0–17.1)
LYMPH#: 0.5 10*3/uL — AB (ref 0.9–3.3)
LYMPH%: 15.6 % (ref 14.0–49.0)
MCH: 31.5 pg (ref 27.2–33.4)
MCHC: 33.6 g/dL (ref 32.0–36.0)
MCV: 93.8 fL (ref 79.3–98.0)
MONO#: 0.5 10*3/uL (ref 0.1–0.9)
MONO%: 14.5 % — ABNORMAL HIGH (ref 0.0–14.0)
NEUT%: 65.5 % (ref 39.0–75.0)
NEUTROS ABS: 2.3 10*3/uL (ref 1.5–6.5)
Platelets: 114 10*3/uL — ABNORMAL LOW (ref 140–400)
RBC: 4.18 10*6/uL — AB (ref 4.20–5.82)
RDW: 18.8 % — AB (ref 11.0–14.6)
WBC: 3.5 10*3/uL — AB (ref 4.0–10.3)

## 2015-10-12 LAB — COMPREHENSIVE METABOLIC PANEL (CC13)
ALBUMIN: 3.8 g/dL (ref 3.5–5.0)
ALK PHOS: 81 U/L (ref 40–150)
ALT: 16 U/L (ref 0–55)
AST: 16 U/L (ref 5–34)
Anion Gap: 10 mEq/L (ref 3–11)
BILIRUBIN TOTAL: 0.57 mg/dL (ref 0.20–1.20)
BUN: 16.1 mg/dL (ref 7.0–26.0)
CO2: 22 meq/L (ref 22–29)
Calcium: 9.6 mg/dL (ref 8.4–10.4)
Chloride: 110 mEq/L — ABNORMAL HIGH (ref 98–109)
Creatinine: 1 mg/dL (ref 0.7–1.3)
EGFR: 88 mL/min/{1.73_m2} — AB (ref >90)
GLUCOSE: 130 mg/dL (ref 70–140)
Potassium: 3.9 mEq/L (ref 3.5–5.1)
SODIUM: 142 meq/L (ref 136–145)
TOTAL PROTEIN: 6.9 g/dL (ref 6.4–8.3)

## 2015-10-12 MED ORDER — OXALIPLATIN CHEMO INJECTION 100 MG/20ML
60.0000 mg/m2 | Freq: Once | INTRAVENOUS | Status: AC
  Administered 2015-10-12: 130 mg via INTRAVENOUS
  Filled 2015-10-12: qty 20

## 2015-10-12 MED ORDER — SODIUM CHLORIDE 0.9 % IV SOLN
Freq: Once | INTRAVENOUS | Status: AC
  Administered 2015-10-12: 10:00:00 via INTRAVENOUS
  Filled 2015-10-12: qty 5

## 2015-10-12 MED ORDER — SODIUM CHLORIDE 0.9 % IV SOLN
2400.0000 mg/m2 | INTRAVENOUS | Status: DC
  Administered 2015-10-12: 5250 mg via INTRAVENOUS
  Filled 2015-10-12: qty 105

## 2015-10-12 MED ORDER — DEXTROSE 5 % IV SOLN
Freq: Once | INTRAVENOUS | Status: AC
  Administered 2015-10-12: 10:00:00 via INTRAVENOUS

## 2015-10-12 MED ORDER — SODIUM CHLORIDE 0.9 % IJ SOLN
10.0000 mL | INTRAMUSCULAR | Status: DC | PRN
  Administered 2015-10-12: 10 mL via INTRAVENOUS
  Filled 2015-10-12: qty 10

## 2015-10-12 MED ORDER — INFLUENZA VAC SPLIT QUAD 0.5 ML IM SUSY
0.5000 mL | PREFILLED_SYRINGE | Freq: Once | INTRAMUSCULAR | Status: AC
  Administered 2015-10-12: 0.5 mL via INTRAMUSCULAR
  Filled 2015-10-12: qty 0.5

## 2015-10-12 MED ORDER — PALONOSETRON HCL INJECTION 0.25 MG/5ML
INTRAVENOUS | Status: AC
  Filled 2015-10-12: qty 5

## 2015-10-12 MED ORDER — PALONOSETRON HCL INJECTION 0.25 MG/5ML
0.2500 mg | Freq: Once | INTRAVENOUS | Status: AC
  Administered 2015-10-12: 0.25 mg via INTRAVENOUS

## 2015-10-12 MED ORDER — ZOLPIDEM TARTRATE 10 MG PO TABS: 10.0000 mg | ORAL_TABLET | Freq: Every evening | ORAL | Status: DC | PRN

## 2015-10-12 MED ORDER — FLUOROURACIL CHEMO INJECTION 2.5 GM/50ML
400.0000 mg/m2 | Freq: Once | INTRAVENOUS | Status: AC
  Administered 2015-10-12: 900 mg via INTRAVENOUS
  Filled 2015-10-12: qty 18

## 2015-10-12 MED ORDER — DEXTROSE 5 % IV SOLN
400.0000 mg/m2 | Freq: Once | INTRAVENOUS | Status: AC
  Administered 2015-10-12: 876 mg via INTRAVENOUS
  Filled 2015-10-12: qty 43.8

## 2015-10-12 NOTE — Progress Notes (Signed)
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 He reports persistent cold sensitivity. No peripheral numbness. No nausea and vomiting or diarrhea. He is working.   Objective:  Vital signs in last 24 hours:  Blood pressure 121/79, pulse 100, temperature 98.5 F (36.9 C), temperature source Oral, resp. rate 20, height 6' (1.829 m), weight 200 lb 8 oz (90.946 kg), SpO2 99 %.    HEENT:  no thrush or ulcers Lymphatics:  no cervical or supraclavicular nodes Resp:  lungs clear bilaterally Cardio:  regular rate and rhythm GI:  no hepatomegaly Vascular:  no leg edema Neurologic: Mild decrease in vibratory sense at the fingertips bilaterally    Portacath/PICC-without erythema  Lab Results:  Lab Results  Component Value Date   WBC 3.5* 10/12/2015   HGB 13.1 10/12/2015   HCT 39.2 10/12/2015   MCV 93.8 10/12/2015   PLT 114* 10/12/2015   NEUTROABS 2.3 10/12/2015      Lab Results  Component Value Date   CEA 86.0* 09/28/2015    Imaging:  Ct Soft Tissue Neck W Contrast  10/08/2015  CLINICAL DATA:  Cancer of distal third of esophagus. Diagnosed 2014 with ongoing chemotherapy. Prior resection. Restaging. No reported current neck complaints. EXAM: CT NECK WITH CONTRAST TECHNIQUE: Multidetector CT imaging of the neck was performed using the standard protocol following the bolus administration of intravenous contrast. CONTRAST:  197m OMNIPAQUE IOHEXOL 300 MG/ML  SOLN COMPARISON:  06[facility]25/[facility] Pharynx and larynx: No pharyngeal or laryngeal mass is identified. Calcifications are noted in the left greater than right palatine tonsillar soft tissues. Postsurgical changes are noted related to treatment of right vocal cord paralysis. Salivary glands: The submandibular and parotid glands are unremarkable. Thyroid: No significant  abnormality. Lymph nodes: 1.2 x 1.1 cm low density/ partially cystic lymph node in the right tracheoesophageal groove near the thoracic inlet is unchanged (series 4, image 96). No other suspicious lymph nodes are identified in the neck. Vascular: A new right internal jugular Port-A-Cath is partially visualized. The right internal jugular vein above the catheter is underdistended and suboptimally opacified with a small amount of thrombus questioned (for example series 4, image 42). Other major vascular structures of the neck appear patent. Limited intracranial: The visualized portion of the brain is unremarkable. Visualized orbits: Not imaged. Mastoids and visualized paranasal sinuses: Chronic, partially polypoid mucosal thickening in the alveolar recesses of the right greater than left maxillary sinuses. Visualized mastoid air cells are clear. Skeleton: Mild lower cervical disc degeneration is noted. No suspicious lytic or blastic osseous lesions are identified. Upper chest: Evaluated on concurrent dedicated chest CT. IMPRESSION: 1. Unchanged 1.2 cm lymph node in the right tracheoesophageal groove. No evidence of new cervical lymphadenopathy. 2. Interval right IJ Port-A-Cath placement with possible right IJ thrombus more proximally in the neck. Electronically Signed   By: ALogan BoresM.D.   On: 10/08/2015 09:56   Ct Chest W Contrast  10/08/2015  CLINICAL DATA:  Lower third esophageal cancer diagnosed in 2014 status post complete esophagectomy on 07/07/2013 with ongoing chemotherapy, presenting for restaging. EXAM: CT CHEST WITH CONTRAST TECHNIQUE: Multidetector CT imaging of the chest was performed during intravenous contrast administration. CONTRAST:  1075mOMNIPAQUE IOHEXOL 300 MG/ML  SOLN COMPARISON:  06/25/2015 chest CT. FINDINGS: Mediastinum/Nodes: Normal heart size. No pericardial fluid/thickening. Right internal jugular MediPort terminates in the lower third of the superior vena cava.  Great vessels are  normal in course and caliber. No central pulmonary emboli. Normal visualized thyroid. The patient is status post esophagectomy with gastric pull through, with no new wall thickening at the esophagogastric anastomosis. The posterior upper right paratracheal previously FDG avid lymph node on the 06/15/2014 PET-CT study measures 0.8 cm in short axis (series 2/ image 11), unchanged since 06/25/2015 chest CT. No axillary lymphadenopathy. Mildly enlarged right lower paratracheal 1.0 cm node (2/25), stable since 06/25/2015. Mildly enlarged 1.4 cm right axillary node (2/29), stable. Mildly enlarged 1.1 cm right infrahilar node (2/34), stable. No new mediastinal adenopathy. No left hilar adenopathy. Lungs/Pleura: No pneumothorax. No pleural effusion. There is a sub solid 1.3 x 0.8 cm subpleural anterior right lower lobe nodule (series 7/ image 40), stable since 06/25/2015. There is a new 1.0 x 0.8 cm ground-glass lingular pulmonary nodule (7/35). There is a new 1.1 x 0.9 cm left upper lobe ground-glass pulmonary nodule (7/20). There is a solid 0.7 cm posterior left upper lobe pulmonary nodule (7/29), slightly increased from 0.6 cm on 06/25/2015. There is stable segmental atelectasis in the left lower lobe. No acute consolidative airspace disease. There is stable sharply marginated subpleural reticulation in the medial apical right upper lobe, in keeping with post treatment change. Upper abdomen: Cholelithiasis. Stable large left Bochdalek's hernia containing the pancreatic tail, splenic flexure of the colon and portions of the proximal small bowel. Musculoskeletal: No aggressive appearing focal osseous lesions. Mild degenerative changes in the thoracic spine. IMPRESSION: 1. Stable mild paratracheal and right hilar lymphadenopathy. 2. Solid 7 mm left upper lobe pulmonary nodule, slightly increased in size, pulmonary metastasis not excluded. 3. New small ground-glass nodules in the left upper lobe, likely inflammatory,  recommend attention on three-month follow-up chest CT. 4. Stable subsolid right lower lobe pulmonary nodule, favor a benign nodular focus of subpleural scarring. 5. Cholelithiasis. Electronically Signed   By: Ilona Sorrel M.D.   On: 10/08/2015 09:23   CT images reviewed with Dr.Goya   Medications: I have reviewed the patient's current medications.  Assessment/Plan: 1.Adenocarcinoma of the distal esophagus/gastric cardia-status post an endoscopic biopsy on 03/18/2013 confirming adenocarcinoma, HER-2/neu not amplified  -Staging CT scans 03/20/2013 confirmed a distal esophageal/gastric cardia mass, distal paraesophageal lymph nodes and paratracheal nodes concerning for metastatic lymphadenopathy  -A PET scan 03/27/2013 with a multifocal area of hypermetabolic activity involving the thoracic esophagus extending into the gastric cardia and hypermetabolic mediastinal nodes, no distant metastatic disease  -Initiation of radiation 04/07/2013 and weekly Taxol/carboplatin 04/08/2013, radiation completed 05/15/2013  -Restaging PET scan 06/09/2013-no evidence of metastatic disease, no hypermetabolic lymphadenopathy  -Esophagogastrectomy 07/07/2013 revealed a ypT1b,ypN0 tumor with negative surgical margins  -CT neck 06/09/2014 revealed a necrotic lymph node in the right tracheoesophageal groove  -PET scan 11/15/2014 revealed a single focus of hypermetabolism corresponding to the necrotic right upper mediastinum lymph node -EUS biopsy of the paraesophageal lymph node 06/25/2012 confirmed metastatic adenocarcinoma  -Radiation completed 08/11/2014 -Rising CEA May 2016 -PET scan 07/13/2015 with hypermetabolic right paratracheal and right hilar adenopathy -Cycle 1 FOLFOX 08/03/2015 -Cycle 2 FOLFOX 08/17/2015 - cycle 3 FOLFOX 08/31/2015 (oxaliplatin dose reduced secondary to thrombocytopenia)  -Cycle 4 FOLFOX 09/14/2015 -Cycle 5 FOLFOX 09/28/2015 - restaging CTs 09/30/2015 with stable disease  involving neck/chest lymph nodes, a slight increase in size of left upper lobe lung nodule  - cycle 6 FOLFOX 10/12/2015    2. Indeterminate 15 mm right liver lesion on the CT scan 03/20/2013, MRI of the liver 06/09/2013 confirmed a right liver hemangioma  3. Anorexia/weight loss and solid dysphagia secondary to #1 -improved 4. History of a colon polyp, status post a polypectomy October 2013  5. history of Thrombocytopenia secondary chemotherapy-the carboplatin wase dose reduced with week 5 of chemotherapy chemotherapy  6. hoarseness secondary to right vocal cord paralysis-CT/PET scan imaging consistent with a malignant right paratracheal lymph node causing the vocal cord paralysis  -Status post a laryngeal plasty at [facility] on 09/02/2014 7. Esophageal stricture-status post dilation 06/08/2015 8. Port-A-Cath placement 07/20/2015 9. Delayed nausea following cycle 1 FOLFOX. Aloxi and Emend added with cycle 2. 10. Thrombocytopenia secondary to chemotherapy  11. 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 We reviewed the restaging CT scans and discuss treatment options. I discussed the case with Dr. Fanny Skates. We recommend continuing FOLFOX chemotherapy. He agrees. We will consider maintenance 5-FU-based therapy at the completion of approximate 4 additional cycles of FOLFOX. He may be a candidate for an immunotherapy trial at [facility] in the future.  He will return for an office visit and chemotherapy in 2 weeks.    Betsy Coder, MD  10/12/2015  8:29 AM

## 2015-10-12 NOTE — Patient Instructions (Signed)
[place] Discharge Instructions for Patients Receiving Chemotherapy  Today you received the following chemotherapy agents oxaliplatin, leucovorin, fluorouracil  To help prevent nausea and vomiting after your treatment, we encourage you to take your nausea medication    If you develop nausea and vomiting that is not controlled by your nausea medication, call the clinic.   BELOW ARE SYMPTOMS THAT SHOULD BE REPORTED IMMEDIATELY:  *FEVER GREATER THAN 100.5 F  *CHILLS WITH OR WITHOUT FEVER  NAUSEA AND VOMITING THAT IS NOT CONTROLLED WITH YOUR NAUSEA MEDICATION  *UNUSUAL SHORTNESS OF BREATH  *UNUSUAL BRUISING OR BLEEDING  TENDERNESS IN MOUTH AND THROAT WITH OR WITHOUT PRESENCE OF ULCERS  *URINARY PROBLEMS  *BOWEL PROBLEMS  UNUSUAL RASH Items with * indicate a potential emergency and should be followed up as soon as possible.  Feel free to call the clinic you have any questions or concerns. The clinic phone number is [phone].  Please show the [place] at check-in to the Emergency Department and triage nurse.

## 2015-10-12 NOTE — Progress Notes (Signed)
Pt had a question about a medication to help him sleep, did not hear back from Dr. Gearldine Shown nurse before he left today. Per chart a prescription for [facility] was called in to his pharmacy today. This writer left a message on patients main phone number to let him know that the prescription is available.

## 2015-10-12 NOTE — Telephone Encounter (Signed)
Gave and printed appt sched and avs fo rpt; for NOV  °

## 2015-10-14 ENCOUNTER — Ambulatory Visit (HOSPITAL_BASED_OUTPATIENT_CLINIC_OR_DEPARTMENT_OTHER): Payer: BLUE CROSS/BLUE SHIELD

## 2015-10-14 VITALS — BP 119/75 | HR 81 | Temp 98.6°F | Resp 20

## 2015-10-14 DIAGNOSIS — C155 Malignant neoplasm of lower third of esophagus: Secondary | ICD-10-CM | POA: Diagnosis not present

## 2015-10-14 DIAGNOSIS — Z452 Encounter for adjustment and management of vascular access device: Secondary | ICD-10-CM | POA: Diagnosis not present

## 2015-10-14 MED ORDER — SODIUM CHLORIDE 0.9 % IJ SOLN
10.0000 mL | INTRAMUSCULAR | Status: DC | PRN
  Administered 2015-10-14: 10 mL
  Filled 2015-10-14: qty 10

## 2015-10-14 MED ORDER — HEPARIN SOD (PORK) LOCK FLUSH 100 UNIT/ML IV SOLN
500.0000 [IU] | Freq: Once | INTRAVENOUS | Status: AC | PRN
  Administered 2015-10-14: 500 [IU]
  Filled 2015-10-14: qty 5

## 2015-10-19 ENCOUNTER — Telehealth: Payer: Self-pay | Admitting: Oncology

## 2015-10-19 NOTE — Telephone Encounter (Signed)
Returned call to patient re message to r/s 11/15 and 11/17 appointments to 11/14 and 11/16 - ok per BS (desk nurse). Left message for patient re new appointments for 11/14 and 11/16. Schedule mailed.

## 2015-10-23 ENCOUNTER — Other Ambulatory Visit: Payer: Self-pay | Admitting: Oncology

## 2015-10-24 ENCOUNTER — Other Ambulatory Visit: Payer: Self-pay | Admitting: Oncology

## 2015-10-25 ENCOUNTER — Other Ambulatory Visit: Payer: BLUE CROSS/BLUE SHIELD

## 2015-10-25 ENCOUNTER — Telehealth: Payer: Self-pay | Admitting: Nurse Practitioner

## 2015-10-25 ENCOUNTER — Ambulatory Visit: Payer: BLUE CROSS/BLUE SHIELD

## 2015-10-25 ENCOUNTER — Other Ambulatory Visit: Payer: Self-pay | Admitting: Nurse Practitioner

## 2015-10-25 ENCOUNTER — Telehealth: Payer: Self-pay | Admitting: *Deleted

## 2015-10-25 ENCOUNTER — Ambulatory Visit: Payer: BLUE CROSS/BLUE SHIELD | Admitting: Nurse Practitioner

## 2015-10-25 NOTE — Telephone Encounter (Signed)
Call forwarded per TRIAGE- see note per LT/NP

## 2015-10-25 NOTE — Telephone Encounter (Signed)
Received a call from triage from Joel Osborne stating he wanted to cancel his appts for today ,He stated he is scheduled at [facility] for a Tracheotomy on Wednesday. Asked him what was the reason for him getting the tracheotomy. He stated he is having difficulty breathing and swallowing and the doctors at [facility] think the tumor is pressing against the trachea. Wished him well with the surgery stated we will reschedule his infusion and appts. Joel Osborne voice sounded raspy and he sounded out of breathe asked him if he was ok " He stated that he was ok' Informed him if he feels like he can't get enough oxygen to call an ambulance or go to the ER. Pt. Verbalized understanding. Notified Lattie Haw about the call.

## 2015-10-26 ENCOUNTER — Other Ambulatory Visit: Payer: BLUE CROSS/BLUE SHIELD

## 2015-10-26 ENCOUNTER — Ambulatory Visit: Payer: BLUE CROSS/BLUE SHIELD

## 2015-10-26 ENCOUNTER — Ambulatory Visit: Payer: BLUE CROSS/BLUE SHIELD | Admitting: Nurse Practitioner

## 2015-10-26 ENCOUNTER — Other Ambulatory Visit: Payer: Self-pay | Admitting: *Deleted

## 2015-10-26 DIAGNOSIS — C155 Malignant neoplasm of lower third of esophagus: Secondary | ICD-10-CM

## 2015-10-26 MED ORDER — MIRTAZAPINE 15 MG PO TABS: 15.0000 mg | ORAL_TABLET | Freq: Every day | ORAL | Status: DC

## 2015-11-01 ENCOUNTER — Telehealth: Payer: Self-pay | Admitting: *Deleted

## 2015-11-01 NOTE — Telephone Encounter (Signed)
Message from pt's wife requesting to cancel appointments this week. Spoke with pt's wife, appointments confirmed for 11/29, she thinks he should be OK to keep these appointments. She reports the plan is for discharge today. They are being set up with supplies for home care of trach.

## 2015-11-09 ENCOUNTER — Other Ambulatory Visit: Payer: Self-pay | Admitting: *Deleted

## 2015-11-09 ENCOUNTER — Telehealth: Payer: Self-pay | Admitting: Oncology

## 2015-11-09 ENCOUNTER — Ambulatory Visit (HOSPITAL_BASED_OUTPATIENT_CLINIC_OR_DEPARTMENT_OTHER): Payer: BLUE CROSS/BLUE SHIELD | Admitting: Oncology

## 2015-11-09 ENCOUNTER — Encounter: Payer: Self-pay | Admitting: *Deleted

## 2015-11-09 ENCOUNTER — Ambulatory Visit: Payer: BLUE CROSS/BLUE SHIELD

## 2015-11-09 ENCOUNTER — Other Ambulatory Visit (HOSPITAL_BASED_OUTPATIENT_CLINIC_OR_DEPARTMENT_OTHER): Payer: BLUE CROSS/BLUE SHIELD

## 2015-11-09 VITALS — BP 108/66 | HR 90 | Temp 98.9°F | Resp 20 | Ht 72.0 in | Wt 196.6 lb

## 2015-11-09 DIAGNOSIS — R630 Anorexia: Secondary | ICD-10-CM

## 2015-11-09 DIAGNOSIS — C155 Malignant neoplasm of lower third of esophagus: Secondary | ICD-10-CM

## 2015-11-09 DIAGNOSIS — R1310 Dysphagia, unspecified: Secondary | ICD-10-CM

## 2015-11-09 DIAGNOSIS — C159 Malignant neoplasm of esophagus, unspecified: Secondary | ICD-10-CM

## 2015-11-09 DIAGNOSIS — R634 Abnormal weight loss: Secondary | ICD-10-CM

## 2015-11-09 DIAGNOSIS — D696 Thrombocytopenia, unspecified: Secondary | ICD-10-CM

## 2015-11-09 LAB — CBC WITH DIFFERENTIAL/PLATELET
BASO%: 0.3 % (ref 0.0–2.0)
BASOS ABS: 0 10*3/uL (ref 0.0–0.1)
EOS ABS: 0.3 10*3/uL (ref 0.0–0.5)
EOS%: 3 % (ref 0.0–7.0)
HEMATOCRIT: 36.9 % — AB (ref 38.4–49.9)
HEMOGLOBIN: 12.4 g/dL — AB (ref 13.0–17.1)
LYMPH#: 0.9 10*3/uL (ref 0.9–3.3)
LYMPH%: 8.9 % — ABNORMAL LOW (ref 14.0–49.0)
MCH: 31.7 pg (ref 27.2–33.4)
MCHC: 33.6 g/dL (ref 32.0–36.0)
MCV: 94.4 fL (ref 79.3–98.0)
MONO#: 0.8 10*3/uL (ref 0.1–0.9)
MONO%: 8.1 % (ref 0.0–14.0)
NEUT#: 7.6 10*3/uL — ABNORMAL HIGH (ref 1.5–6.5)
NEUT%: 79.7 % — AB (ref 39.0–75.0)
Platelets: 167 10*3/uL (ref 140–400)
RBC: 3.91 10*6/uL — ABNORMAL LOW (ref 4.20–5.82)
RDW: 15.4 % — ABNORMAL HIGH (ref 11.0–14.6)
WBC: 9.6 10*3/uL (ref 4.0–10.3)

## 2015-11-09 LAB — COMPREHENSIVE METABOLIC PANEL (CC13)
ALBUMIN: 3.2 g/dL — AB (ref 3.5–5.0)
ALK PHOS: 78 U/L (ref 40–150)
ALT: 12 U/L (ref 0–55)
ANION GAP: 8 meq/L (ref 3–11)
AST: 13 U/L (ref 5–34)
BUN: 12.2 mg/dL (ref 7.0–26.0)
CALCIUM: 9.5 mg/dL (ref 8.4–10.4)
CHLORIDE: 108 meq/L (ref 98–109)
CO2: 27 mEq/L (ref 22–29)
Creatinine: 0.7 mg/dL (ref 0.7–1.3)
Glucose: 87 mg/dl (ref 70–140)
POTASSIUM: 4.2 meq/L (ref 3.5–5.1)
Sodium: 142 mEq/L (ref 136–145)
Total Bilirubin: 0.34 mg/dL (ref 0.20–1.20)
Total Protein: 6.8 g/dL (ref 6.4–8.3)

## 2015-11-09 LAB — CEA: CEA: 80.5 ng/mL — AB (ref 0.0–5.0)

## 2015-11-09 MED ORDER — SODIUM CHLORIDE 0.9 % IJ SOLN
10.0000 mL | INTRAMUSCULAR | Status: DC | PRN
  Administered 2015-11-09: 10 mL via INTRAVENOUS
  Filled 2015-11-09: qty 10

## 2015-11-09 MED ORDER — ESZOPICLONE 1 MG PO TABS: 1.0000 mg | ORAL_TABLET | Freq: Every evening | ORAL | Status: DC | PRN

## 2015-11-09 MED ORDER — HEPARIN SOD (PORK) LOCK FLUSH 100 UNIT/ML IV SOLN
500.0000 [IU] | Freq: Once | INTRAVENOUS | Status: AC
  Administered 2015-11-09: 500 [IU] via INTRAVENOUS
  Filled 2015-11-09: qty 5

## 2015-11-09 MED ORDER — LIDOCAINE-PRILOCAINE 2.5-2.5 % EX CREA
1.0000 | TOPICAL_CREAM | CUTANEOUS | Status: AC | PRN
Start: 2015-11-09 — End: ?

## 2015-11-09 NOTE — Progress Notes (Signed)
Oncology Nurse Navigator Documentation  Oncology Nurse Navigator Flowsheets 11/09/2015  Navigator Encounter Type Other-Chemo f/u  Patient Visit Type Medonc  Treatment Phase Disease progression  Barriers/Navigation Needs Family concerns  Interventions Coordination of Care--return to Dr. Karmen Stabs asap for clinical trial  Coordination of Care MD Appointments-given appointment for 12/03/15 @ 10:30(1st available)  Support Groups/Services -  Time Spent with Patient 30  Called back after speaking with scheduler to speak with triage nurse. Requested urgent message for Dr. Fanny Skates to call Dr. Benay Spice to discuss need to be seen sooner-had to leave this on voice mail. Recording gave phone # [phone] to call to have provider paged. Forwarded this to Dr. Benay Spice.

## 2015-11-09 NOTE — Telephone Encounter (Signed)
Left message for patient re next appointment for 12/13 and mailed schedule. Inbasket referral message to HIM team re referral to Dr. Fanny Skates at [facility].

## 2015-11-09 NOTE — Progress Notes (Signed)
[facility] OFFICE PROGRESS NOTE   Diagnosis: Gastroesophageal cancer  INTERVAL HISTORY:   Joel Osborne returns after missing a scheduled appointment earlier this month. He developed dysphagia last month and underwent an esophageal stricture dilation procedure on 10/06/2015. He was seen at [facility] for dyspnea and hoarseness 10/21/2015. He was diagnosed with bilateral vocal cord paralysis placing her at risk for aspiration. He was taken to a tracheostomy and bilateral injection laryngo-plasty at [facility] on 10/27/2015.  He reports partial improvement in his voice following this procedure. His oral intake has been poor due to difficulty tolerating solids. No other complaint.  Objective:  Vital signs in last 24 hours:  Blood pressure 108/66, pulse 90, temperature 98.9 F (37.2 C), temperature source Oral, resp. rate 20, height 6' (1.829 m), weight 196 lb 9.6 oz (89.177 kg), SpO2 99 %.    HEENT:  neck without mass, tracheostomy in place Lymphatics:  no cervical, supraclavicular, or axillary nodes Resp:  lungs clear bilaterally Cardio:  regular rate and rhythm GI:  no hepatomegaly, no mass, nontender Vascular:  no leg edema   Portacath/PICC-without erythema  Lab Results:  Lab Results  Component Value Date   WBC 9.6 11/09/2015   HGB 12.4* 11/09/2015   HCT 36.9* 11/09/2015   MCV 94.4 11/09/2015   PLT 167 11/09/2015   NEUTROABS 7.6* 11/09/2015      Lab Results  Component Value Date   CEA 80.5* 11/09/2015     Medications: I have reviewed the patient's current medications.  Assessment/Plan: 1.Adenocarcinoma of the distal esophagus/gastric cardia-status post an endoscopic biopsy on 03/18/2013 confirming adenocarcinoma, HER-2/neu not amplified  -Staging CT scans 03/20/2013 confirmed a distal esophageal/gastric cardia mass, distal paraesophageal lymph nodes and paratracheal nodes concerning for metastatic lymphadenopathy  -A PET scan 03/27/2013 with a  multifocal area of hypermetabolic activity involving the thoracic esophagus extending into the gastric cardia and hypermetabolic mediastinal nodes, no distant metastatic disease  -Initiation of radiation 04/07/2013 and weekly Taxol/carboplatin 04/08/2013, radiation completed 05/15/2013  -Restaging PET scan 06/09/2013-no evidence of metastatic disease, no hypermetabolic lymphadenopathy  -Esophagogastrectomy 07/07/2013 revealed a ypT1b,ypN0 tumor with negative surgical margins  -CT neck 06/09/2014 revealed a necrotic lymph node in the right tracheoesophageal groove  -PET scan 11/15/2014 revealed a single focus of hypermetabolism corresponding to the necrotic right upper mediastinum lymph node -EUS biopsy of the paraesophageal lymph node 06/25/2012 confirmed metastatic adenocarcinoma  -Radiation completed 08/11/2014 -Rising CEA May 2016 -PET scan 07/13/2015 with hypermetabolic right paratracheal and right hilar adenopathy -Cycle 1 FOLFOX 08/03/2015 -Cycle 2 FOLFOX 08/17/2015 - cycle 3 FOLFOX 08/31/2015 (oxaliplatin dose reduced secondary to thrombocytopenia)  -Cycle 4 FOLFOX 09/14/2015 -Cycle 5 FOLFOX 09/28/2015 - restaging CTs 09/30/2015 with stable disease involving neck/chest lymph nodes, a slight increase in size of left upper lobe lung nodule  - cycle 6 FOLFOX 10/12/2015  -FOLFOX discontinued secondary to development of bilateral vocal cord paralysis November 2016   2. Indeterminate 15 mm right liver lesion on the CT scan 03/20/2013, MRI of the liver 06/09/2013 confirmed a right liver hemangioma  3. Anorexia/weight loss and solid dysphagia secondary to #1 -improved 4. History of a colon polyp, status post a polypectomy October 2013  5. history of Thrombocytopenia secondary chemotherapy-the carboplatin wase dose reduced with week 5 of chemotherapy chemotherapy  6. hoarseness secondary to right vocal cord paralysis-CT/PET scan imaging consistent with a malignant right paratracheal  lymph node causing the vocal cord paralysis  -Status post a laryngeal plasty at [facility] on 09/02/2014 7. Esophageal stricture-status post  dilation 06/08/2015 -Food impaction removal 09/29/2015 -Esophageal stricture dilation 10/06/2015 8. Port-A-Cath placement 07/20/2015 9. Delayed nausea following cycle 1 FOLFOX. Aloxi and Emend added with cycle 2. 10. Thrombocytopenia secondary to chemotherapy  11. Emotional lability/depression-prophylactic Decadron discontinued; Remeron initiated 12. 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 The vocal cord paralysis is most likely secondary to progression of the esophagus cancer. We decided to discontinue FOLFOX. He will see Dr. Fanny Osborne to discuss clinical trial options. If he is not a candidate for an immunotherapy trial we can consider salvage chemotherapy. He will return for an office visit here in 2 weeks.      Betsy Coder, MD  11/09/2015  5:28 PM

## 2015-11-10 ENCOUNTER — Telehealth: Payer: Self-pay | Admitting: *Deleted

## 2015-11-10 NOTE — Telephone Encounter (Signed)
Son called inquiring about upcoming appointments. Patient sees MD Benay Spice in the next two weeks, but not able to get an appointment with MD Uronis until the end of December. Wants to know if MD Uronis appointment is too late in December and if it needs to be changed. Message sent to MD Benay Spice and RN Amy.

## 2015-11-17 ENCOUNTER — Encounter: Payer: Self-pay | Admitting: *Deleted

## 2015-11-19 ENCOUNTER — Other Ambulatory Visit: Payer: Self-pay | Admitting: Oncology

## 2015-11-19 ENCOUNTER — Telehealth: Payer: Self-pay | Admitting: *Deleted

## 2015-11-19 ENCOUNTER — Telehealth: Payer: Self-pay | Admitting: Oncology

## 2015-11-19 DIAGNOSIS — C155 Malignant neoplasm of lower third of esophagus: Secondary | ICD-10-CM

## 2015-11-19 NOTE — Telephone Encounter (Signed)
Per Dr. Benay Spice; notified pt that CT of neck and chest is scheduled at [facility] Monday 12/12 arrive at 10:45 (gave address/phone# also) and confirmed appt with Dr. Benay Spice 12/13 @ 11am.  Pt verbalized understanding of all instructions and expressed appreciation.

## 2015-11-19 NOTE — Telephone Encounter (Signed)
Central radiology scheduling will contact patient re ct scan - message to managed care re preauth. No other orders per 12/9 pof.

## 2015-11-22 ENCOUNTER — Ambulatory Visit (HOSPITAL_BASED_OUTPATIENT_CLINIC_OR_DEPARTMENT_OTHER)
Admission: RE | Admit: 2015-11-22 | Discharge: 2015-11-22 | Disposition: A | Discharge status: Home / Self Care | Payer: BLUE CROSS/BLUE SHIELD | Source: Ambulatory Visit | Attending: Oncology | Admitting: Oncology

## 2015-11-22 DIAGNOSIS — R918 Other nonspecific abnormal finding of lung field: Secondary | ICD-10-CM | POA: Diagnosis not present

## 2015-11-22 DIAGNOSIS — C155 Malignant neoplasm of lower third of esophagus: Secondary | ICD-10-CM | POA: Diagnosis present

## 2015-11-22 DIAGNOSIS — K449 Diaphragmatic hernia without obstruction or gangrene: Secondary | ICD-10-CM | POA: Diagnosis not present

## 2015-11-22 DIAGNOSIS — R938 Abnormal findings on diagnostic imaging of other specified body structures: Secondary | ICD-10-CM | POA: Diagnosis not present

## 2015-11-22 DIAGNOSIS — K8020 Calculus of gallbladder without cholecystitis without obstruction: Secondary | ICD-10-CM | POA: Diagnosis not present

## 2015-11-22 MED ORDER — IOHEXOL 300 MG/ML  SOLN
75.0000 mL | Freq: Once | INTRAMUSCULAR | Status: AC | PRN
  Administered 2015-11-22: 75 mL via INTRAVENOUS

## 2015-11-23 ENCOUNTER — Encounter: Payer: Self-pay | Admitting: Oncology

## 2015-11-23 ENCOUNTER — Ambulatory Visit: Payer: BLUE CROSS/BLUE SHIELD

## 2015-11-23 ENCOUNTER — Telehealth: Payer: Self-pay | Admitting: Pharmacist

## 2015-11-23 ENCOUNTER — Ambulatory Visit (HOSPITAL_BASED_OUTPATIENT_CLINIC_OR_DEPARTMENT_OTHER): Payer: BLUE CROSS/BLUE SHIELD | Admitting: Oncology

## 2015-11-23 VITALS — BP 129/76 | HR 92 | Temp 98.5°F | Resp 18 | Ht 72.0 in | Wt 197.7 lb

## 2015-11-23 DIAGNOSIS — D696 Thrombocytopenia, unspecified: Secondary | ICD-10-CM | POA: Diagnosis not present

## 2015-11-23 DIAGNOSIS — C159 Malignant neoplasm of esophagus, unspecified: Secondary | ICD-10-CM

## 2015-11-23 DIAGNOSIS — C155 Malignant neoplasm of lower third of esophagus: Secondary | ICD-10-CM

## 2015-11-23 NOTE — Telephone Encounter (Signed)
Xeloda prescription faxed to [facility].

## 2015-11-23 NOTE — Progress Notes (Signed)
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 Has been seen at [facility] to discuss possible clinical trial. The trial is not currently  Open. He reports no change in his voice. His oral intake has improved. Able to take in soft food and even some meats. No other complaints.  Objective:  Vital signs in last 24 hours:  Blood pressure 129/76, pulse 92, temperature 98.5 F (36.9 C), temperature source Oral, resp. rate 18, height 6' (1.829 m), weight 197 lb 11.2 oz (89.676 kg), SpO2 100 %.    HEENT:  neck without mass, tracheostomy in place Lymphatics:  no cervical, supraclavicular, or axillary nodes Resp:  lungs clear bilaterally Cardio:  regular rate and rhythm GI:  no hepatomegaly, no mass, nontender Vascular:  no leg edema   Portacath/PICC-without erythema  Lab Results:  Lab Results  Component Value Date   WBC 9.6 11/09/2015   HGB 12.4* 11/09/2015   HCT 36.9* 11/09/2015   MCV 94.4 11/09/2015   PLT 167 11/09/2015   NEUTROABS 7.6* 11/09/2015      Lab Results  Component Value Date   CEA 80.5* 11/09/2015     Medications: I have reviewed the patient's current medications.  Assessment/Plan: 1.Adenocarcinoma of the distal esophagus/gastric cardia-status post an endoscopic biopsy on 03/18/2013 confirming adenocarcinoma, HER-2/neu not amplified  -Staging CT scans 03/20/2013 confirmed a distal esophageal/gastric cardia mass, distal paraesophageal lymph nodes and paratracheal nodes concerning for metastatic lymphadenopathy  -A PET scan 03/27/2013 with a multifocal area of hypermetabolic activity involving the thoracic esophagus extending into the gastric cardia and hypermetabolic mediastinal nodes, no distant metastatic disease  -Initiation of radiation 04/07/2013 and weekly Taxol/carboplatin 04/08/2013,  radiation completed 05/15/2013  -Restaging PET scan 06/09/2013-no evidence of metastatic disease, no hypermetabolic lymphadenopathy  -Esophagogastrectomy 07/07/2013 revealed a ypT1b,ypN0 tumor with negative surgical margins  -CT neck 06/09/2014 revealed a necrotic lymph node in the right tracheoesophageal groove  -PET scan 11/15/2014 revealed a single focus of hypermetabolism corresponding to the necrotic right upper mediastinum lymph node -EUS biopsy of the paraesophageal lymph node 06/25/2012 confirmed metastatic adenocarcinoma  -Radiation completed 08/11/2014 -Rising CEA May 2016 -PET scan 07/13/2015 with hypermetabolic right paratracheal and right hilar adenopathy -Cycle 1 FOLFOX 08/03/2015 -Cycle 2 FOLFOX 08/17/2015 - cycle 3 FOLFOX 08/31/2015 (oxaliplatin dose reduced secondary to thrombocytopenia)  -Cycle 4 FOLFOX 09/14/2015 -Cycle 5 FOLFOX 09/28/2015 - restaging CTs 09/30/2015 with stable disease involving neck/chest lymph nodes, a slight increase in size of left upper lobe lung nodule  - cycle 6 FOLFOX 10/12/2015  -FOLFOX discontinued secondary to development of bilateral vocal cord paralysis November 2016 - CT scan of the neck and chest on 11/22/15 revealed no definite disease in the neck. At the thoracic inlet region, there is questionably slight increase in prominence of soft tissue at the right tracheoesophageal groove. This could be an enlarging node or mass but could also relate to the esophagectomy and pull-through surgery.Enlarging lingular and left lower lobe nodules are worrisome for metastatic disease. Peribronchovascular nodular consolidation in the anterior right lower lobe, slightly more prominent, indeterminate. Cholelithiasis. Left diaphragmatic hernia, stable. 2. Indeterminate 15 mm right liver lesion on the CT scan 03/20/2013, MRI of the liver 06/09/2013 confirmed a right liver hemangioma  3. Anorexia/weight loss and solid dysphagia secondary to #1 -improved 4.  History of a colon polyp, status post a polypectomy October 2013  5. history of  Thrombocytopenia secondary chemotherapy-the carboplatin wase dose reduced with week 5 of chemotherapy chemotherapy  6. hoarseness secondary to right vocal cord paralysis-CT/PET scan imaging consistent with a malignant right paratracheal lymph node causing the vocal cord paralysis  -Status post a laryngeal plasty at [facility] on 09/02/2014 7. Esophageal stricture-status post dilation 06/08/2015 -Food impaction removal 09/29/2015 -Esophageal stricture dilation 10/06/2015 8. Port-A-Cath placement 07/20/2015 9. Delayed nausea following cycle 1 FOLFOX. Aloxi and Emend added with cycle 2. 10. Thrombocytopenia secondary to chemotherapy  11. Emotional lability/depression-prophylactic Decadron discontinued; Remeron initiated 12. Progressive hoarseness November 2016, diagnosed with bilateral vocal cord paralysis, status post a tracheostomy and bilateral injection laryngoplasty at [facility] on 10/27/2015   Disposition: The patient was seen and discussed with Dr. Benay Spice. CT scan results discussed which showed overall stable disease. CEA  Has been stable  In our office. Recommended treatment with Xeloda.  Dose will be based on height/weight. He will take this medication 1 week on and 1 week off. Side effect discussed including,  But not limited to mucositis, diarrhea, and skin changes including sun sensitivity, hyperpigmentation, and hand/fooot syndrome. The patient would like to proceed. The pan is to begin later this week pending insurance approval. The patient would also like to pursue clinical trials and we will continue to look for them/ Dr. Benay Spice plans to communicate this plan to Dr. Fanny Skates at [facility].   He will return for an office visit here in about 1 month.   Mikey Bussing, DNP, AGPCNP-BC, AOCNP  11/24/2015  9:22 AM  This was a shared visit with Mikey Bussing. I reviewed the restaging CT findings with Dr. Verline Lema  and his wife. I communicated with Dr. Fanny Skates. There is minimal evidence of disease progression on the CT. We decided to begin maintenance capecitabine.  I reviewed the potential toxicities associated with capecitabine and he agrees to proceed. The plan is to schedule a restaging CT at a three-month interval. Dr. Fanny Skates will continue looking for a clinical trial at [facility].  Julieanne Manson, M.D.

## 2015-11-23 NOTE — Patient Instructions (Signed)
Capecitabine tablets What is this medicine? CAPECITABINE (ka pe SITE a been) is a chemotherapy drug. It slows the growth of cancer cells. This medicine is used to treat breast cancer, and also colon or rectal cancer. This medicine may be used for other purposes; ask your health care provider or pharmacist if you have questions. What should I tell my health care provider before I take this medicine? They need to know if you have any of these conditions: -bleeding or blood disorders -dihydropyrimidine dehydrogenase (DPD) deficiency -heart disease -infection (especially a virus infection such as chickenpox, cold sores, or herpes) -kidney disease -liver disease -an unusual or allergic reaction to capecitabine, 5-fluorouracil, other medicines, foods, dyes, or preservatives -pregnant or trying to get pregnant -breast-feeding How should I use this medicine? Take this medicine by mouth with a glass of water, within 30 minutes of the end of a meal. Do not cut, crush or chew this medicine. Follow the directions on the prescription label. Take your medicine at regular intervals. Do not take it more often than directed. Do not stop taking except on your doctor's advice. Your doctor may want you to take a combination of 150 mg and 500 mg tablets for each dose. It is very important that you know how to correctly take your dose. Taking the wrong tablets could result in an overdose (too much medication) or underdose (too little medication). Talk to your pediatrician regarding the use of this medicine in children. Special care may be needed. Overdosage: If you think you have taken too much of this medicine contact a poison control center or emergency room at once. NOTE: This medicine is only for you. Do not share this medicine with others. What if I miss a dose? If you miss a dose, do not take the missed dose at all. Do not take double or extra doses. Instead, continue with your next scheduled dose and check  with your doctor. What may interact with this medicine? -antacids with aluminum and/or magnesium -folic acid -leucovorin -medicines to increase blood counts like filgrastim, pegfilgrastim, sargramostim -phenytoin -vaccines -warfarin Talk to your doctor or health care professional before taking any of these medicines: -acetaminophen -aspirin -ibuprofen -ketoprofen -naproxen This list may not describe all possible interactions. Give your health care provider a list of all the medicines, herbs, non-prescription drugs, or dietary supplements you use. Also tell them if you smoke, drink alcohol, or use illegal drugs. Some items may interact with your medicine. What should I watch for while using this medicine? Visit your doctor for checks on your progress. This drug may make you feel generally unwell. This is not uncommon, as chemotherapy can affect healthy cells as well as cancer cells. Report any side effects. Continue your course of treatment even though you feel ill unless your doctor tells you to stop. In some cases, you may be given additional medicines to help with side effects. Follow all directions for their use. Call your doctor or health care professional for advice if you get a fever, chills or sore throat, or other symptoms of a cold or flu. Do not treat yourself. This drug decreases your body's ability to fight infections. Try to avoid being around people who are sick. This medicine may increase your risk to bruise or bleed. Call your doctor or health care professional if you notice any unusual bleeding. Be careful brushing and flossing your teeth or using a toothpick because you may get an infection or bleed more easily. If you have any dental  work done, tell your dentist you are receiving this medicine. Avoid taking products that contain aspirin, acetaminophen, ibuprofen, naproxen, or ketoprofen unless instructed by your doctor. These medicines may hide a fever. Do not become  pregnant while taking this medicine. Women should inform their doctor if they wish to become pregnant or think they might be pregnant. There is a potential for serious side effects to an unborn child. Talk to your health care professional or pharmacist for more information. Do not breast-feed an infant while taking this medicine. Men are advised not to father a child while taking this medicine. What side effects may I notice from receiving this medicine? Side effects that you should report to your doctor or health care professional as soon as possible: -allergic reactions like skin rash, itching or hives, swelling of the face, lips, or tongue -low blood counts - this medicine may decrease the number of white blood cells, red blood cells and platelets. You may be at increased risk for infections and bleeding. -signs of infection - fever or chills, cough, sore throat, pain or difficulty passing urine -signs of decreased platelets or bleeding - bruising, pinpoint red spots on the skin, black, tarry stools, blood in the urine -signs of decreased red blood cells - unusually weak or tired, fainting spells, lightheadedness -breathing problems -changes in vision -chest pain -dark urine -diarrhea of more than 4 bowel movements in one day or any diarrhea at night; bloody or watery diarrhea -dizziness -mouth sores -nausea and vomiting -pain, tingling, numbness in the hands or feet -redness, swelling, or sores on hands or feet -stomach pain -vomiting -yellow color of skin or eyes Side effects that usually do not require medical attention (report to your doctor or health care professional if they continue or are bothersome): -constipation -diarrhea -dry or itchy skin -hair loss -loss of appetite -nausea -weak or tired This list may not describe all possible side effects. Call your doctor for medical advice about side effects. You may report side effects to FDA at 1-800-FDA-1088. Where should I keep  my medicine? Keep out of the reach of children. Store at room temperature between 15 and 30 degrees C (59 and 86 degrees F). Keep container tightly closed. Throw away any unused medicine after the expiration date. NOTE: This sheet is a summary. It may not cover all possible information. If you have questions about this medicine, talk to your doctor, pharmacist, or health care provider.    2016, Elsevier/Gold Standard. (2015-01-11 17:03:30)

## 2015-11-24 ENCOUNTER — Telehealth: Payer: Self-pay | Admitting: Oncology

## 2015-11-24 ENCOUNTER — Telehealth: Payer: Self-pay | Admitting: Pharmacist

## 2015-11-24 NOTE — Telephone Encounter (Signed)
s.w. pt wife and advised on Jan 2017 appt....ok and aware

## 2015-11-24 NOTE — Telephone Encounter (Signed)
12/14 - Insurance requires specialty pharmacy for Xeloda. Faxed to [facility] at # [phone]

## 2015-11-30 ENCOUNTER — Telehealth: Payer: Self-pay

## 2015-11-30 NOTE — Telephone Encounter (Signed)
Wife called stating they have not heard from anyone nor received any chemo pills. She stated they have been getting the run a round. I called [facility] and the prescription went into insurance verification on 12/19. Insurance should hopefully be finished by tomorrow so shipment can be received by Thursday. I let [name] know this. She now has question about when to start the medication since they were expecting it on Monday.

## 2015-11-30 NOTE — Telephone Encounter (Signed)
Start when they receive it

## 2015-11-30 NOTE — Telephone Encounter (Signed)
S/w Voula to start capecitabine when they receive it.

## 2015-12-09 ENCOUNTER — Other Ambulatory Visit: Payer: Self-pay | Admitting: Oncology

## 2015-12-10 ENCOUNTER — Encounter: Payer: Self-pay | Admitting: Pharmacist

## 2015-12-10 NOTE — Progress Notes (Signed)
Oral Chemotherapy Pharmacist Encounter   I spoke with patient for overview of new oral chemotherapy medication: Xeloda. Pt is doing well. The prescription requires specialty pharmacy with [facility]. There have been delays and issues with delivery. Pt states he should receive the medication today (12/30) and start on Saturday 12/31.   Counseled patient on administration, dosing, side effects, safe handling, and monitoring. Side effects include but not limited to: Fatigue, diarrhea, rash, stomatitis, and hand/foot syndrome.  Dr. Verline Lema voiced understanding and appreciation.   All questions answered.  Will follow up in 1-2 weeks for adherence and toxicity management.   Thank you,  Montel Clock, PharmD, [facility]

## 2015-12-16 ENCOUNTER — Telehealth: Payer: Self-pay | Admitting: Pharmacist

## 2015-12-16 NOTE — Telephone Encounter (Signed)
12/16/15: Attempted to call patient regarding new oral medication: Xeloda. No Answer. Left VM for patient to call back with any questions or concerns.   Thank you,  Montel Clock, PharmD, [facility] [phone]

## 2015-12-20 ENCOUNTER — Telehealth: Payer: Self-pay

## 2015-12-20 ENCOUNTER — Ambulatory Visit: Payer: BLUE CROSS/BLUE SHIELD | Admitting: Nurse Practitioner

## 2015-12-20 ENCOUNTER — Other Ambulatory Visit: Payer: BLUE CROSS/BLUE SHIELD

## 2015-12-20 ENCOUNTER — Other Ambulatory Visit: Payer: Self-pay | Admitting: *Deleted

## 2015-12-20 NOTE — Telephone Encounter (Signed)
Call placed to [name] in reference to his appointment. Spoke with [name] appointment was suppose to be rescheduled because [name] didn't start medication regimen on the date that was planned will reschedule appointment.

## 2015-12-21 ENCOUNTER — Other Ambulatory Visit: Payer: Self-pay | Admitting: *Deleted

## 2015-12-21 ENCOUNTER — Telehealth: Payer: Self-pay | Admitting: Oncology

## 2015-12-21 NOTE — Telephone Encounter (Signed)
VM message from patient regarding re-scheduling appt.

## 2015-12-21 NOTE — Telephone Encounter (Signed)
Order sent to schedulers to reschedule office visit.

## 2015-12-21 NOTE — Telephone Encounter (Signed)
Returned call to patient re r/s 1/9 lab/fu. Gave patient new appointment for 1/26 - end of January per patient.

## 2016-01-04 ENCOUNTER — Other Ambulatory Visit: Payer: Self-pay | Admitting: Oncology

## 2016-01-04 ENCOUNTER — Telehealth: Payer: Self-pay | Admitting: *Deleted

## 2016-01-04 DIAGNOSIS — C159 Malignant neoplasm of esophagus, unspecified: Secondary | ICD-10-CM

## 2016-01-04 MED ORDER — CAPECITABINE 500 MG PO TABS: ORAL_TABLET | ORAL | Status: DC

## 2016-01-04 NOTE — Telephone Encounter (Signed)
"  I'd like to know what Dr. Benay Spice and Dr. Fanny Skates lined up for me and what day?  How do I get a new refill for Capecitabine 500 mg.  I finished two weeks and am due to resume this Friday or Saturday.  [facility] sends this."  Will notify Dr. Benay Spice of this call.  Next scheduled F/U 01-06-2016 with A.P.P.

## 2016-01-04 NOTE — Telephone Encounter (Signed)
Per Dr. Benay Spice; notified pt that Xeloda has been refilled and MD will go over information/plan at office visit 01/06/16.  Pt verbalized understanding and expressed appreciation for call back.

## 2016-01-06 ENCOUNTER — Other Ambulatory Visit (HOSPITAL_BASED_OUTPATIENT_CLINIC_OR_DEPARTMENT_OTHER): Payer: BLUE CROSS/BLUE SHIELD

## 2016-01-06 ENCOUNTER — Telehealth: Payer: Self-pay | Admitting: Nurse Practitioner

## 2016-01-06 ENCOUNTER — Telehealth: Payer: Self-pay

## 2016-01-06 ENCOUNTER — Ambulatory Visit (HOSPITAL_BASED_OUTPATIENT_CLINIC_OR_DEPARTMENT_OTHER): Payer: BLUE CROSS/BLUE SHIELD | Admitting: Nurse Practitioner

## 2016-01-06 VITALS — BP 136/85 | HR 95 | Temp 99.2°F | Resp 18 | Ht 72.0 in | Wt 202.9 lb

## 2016-01-06 DIAGNOSIS — C159 Malignant neoplasm of esophagus, unspecified: Secondary | ICD-10-CM

## 2016-01-06 DIAGNOSIS — D696 Thrombocytopenia, unspecified: Secondary | ICD-10-CM

## 2016-01-06 DIAGNOSIS — R630 Anorexia: Secondary | ICD-10-CM | POA: Diagnosis not present

## 2016-01-06 DIAGNOSIS — C155 Malignant neoplasm of lower third of esophagus: Secondary | ICD-10-CM

## 2016-01-06 DIAGNOSIS — R634 Abnormal weight loss: Secondary | ICD-10-CM

## 2016-01-06 DIAGNOSIS — G620 Drug-induced polyneuropathy: Secondary | ICD-10-CM | POA: Diagnosis not present

## 2016-01-06 DIAGNOSIS — Z95828 Presence of other vascular implants and grafts: Secondary | ICD-10-CM

## 2016-01-06 LAB — CBC WITH DIFFERENTIAL/PLATELET
BASO%: 0.5 % (ref 0.0–2.0)
BASOS ABS: 0 10*3/uL (ref 0.0–0.1)
EOS%: 3.6 % (ref 0.0–7.0)
Eosinophils Absolute: 0.2 10*3/uL (ref 0.0–0.5)
HEMATOCRIT: 41.5 % (ref 38.4–49.9)
HGB: 13.7 g/dL (ref 13.0–17.1)
LYMPH#: 0.8 10*3/uL — AB (ref 0.9–3.3)
LYMPH%: 12 % — AB (ref 14.0–49.0)
MCH: 30.4 pg (ref 27.2–33.4)
MCHC: 33 g/dL (ref 32.0–36.0)
MCV: 92.3 fL (ref 79.3–98.0)
MONO#: 0.6 10*3/uL (ref 0.1–0.9)
MONO%: 10.2 % (ref 0.0–14.0)
NEUT#: 4.7 10*3/uL (ref 1.5–6.5)
NEUT%: 73.7 % (ref 39.0–75.0)
PLATELETS: 166 10*3/uL (ref 140–400)
RBC: 4.49 10*6/uL (ref 4.20–5.82)
RDW: 15.1 % — ABNORMAL HIGH (ref 11.0–14.6)
WBC: 6.4 10*3/uL (ref 4.0–10.3)

## 2016-01-06 LAB — COMPREHENSIVE METABOLIC PANEL
ALT: 10 U/L (ref 0–55)
ANION GAP: 8 meq/L (ref 3–11)
AST: 14 U/L (ref 5–34)
Albumin: 3.9 g/dL (ref 3.5–5.0)
Alkaline Phosphatase: 89 U/L (ref 40–150)
BUN: 11.8 mg/dL (ref 7.0–26.0)
CALCIUM: 9.1 mg/dL (ref 8.4–10.4)
CHLORIDE: 107 meq/L (ref 98–109)
CO2: 28 meq/L (ref 22–29)
Creatinine: 0.9 mg/dL (ref 0.7–1.3)
EGFR: >90 mL/min/{1.73_m2} (ref >90)
Glucose: 81 mg/dl (ref 70–140)
POTASSIUM: 4 meq/L (ref 3.5–5.1)
Sodium: 143 mEq/L (ref 136–145)
Total Bilirubin: 0.49 mg/dL (ref 0.20–1.20)
Total Protein: 7.5 g/dL (ref 6.4–8.3)

## 2016-01-06 LAB — CEA (PARALLEL TESTING): CEA: 183.5 ng/mL — AB (ref 0.0–5.0)

## 2016-01-06 MED ORDER — HEPARIN SOD (PORK) LOCK FLUSH 100 UNIT/ML IV SOLN
500.0000 [IU] | Freq: Once | INTRAVENOUS | Status: AC
  Administered 2016-01-06: 500 [IU] via INTRAVENOUS
  Filled 2016-01-06: qty 5

## 2016-01-06 MED ORDER — SODIUM CHLORIDE 0.9% FLUSH
10.0000 mL | INTRAVENOUS | Status: DC | PRN
  Administered 2016-01-06: 10 mL via INTRAVENOUS
  Filled 2016-01-06: qty 10

## 2016-01-06 NOTE — Progress Notes (Addendum)
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 He began Xeloda 1 week on/1 week off 12/11/2015. He denies nausea/vomiting. No mouth sores. No diarrhea. No hand or foot pain or redness. He notes increased numbness and tingling in the hands and feet. Describes his appetite as "okay". He has pain related to the neck incision.  Objective:  Vital signs in last 24 hours:  Blood pressure 136/85, pulse 95, temperature 99.2 F (37.3 C), temperature source Oral, resp. rate 18, height 6' (1.829 m), weight 202 lb 14.4 oz (92.035 kg), SpO2 100 %.    HEENT: No thrush or ulcers. Tracheostomy in place. Resp: Lungs clear bilaterally. Cardio: Regular rate and rhythm. GI: Abdomen soft and nontender. No hepatomegaly. Vascular: No leg edema.  Skin: Palms without erythema. Port-A-Cath without erythema.    Lab Results:  Lab Results  Component Value Date   WBC 6.4 01/06/2016   HGB 13.7 01/06/2016   HCT 41.5 01/06/2016   MCV 92.3 01/06/2016   PLT 166 01/06/2016   NEUTROABS 4.7 01/06/2016    Imaging:  No results found.  Medications: I have reviewed the patient's current medications.  Assessment/Plan: 1.Adenocarcinoma of the distal esophagus/gastric cardia-status post an endoscopic biopsy on 03/18/2013 confirming adenocarcinoma, HER-2/neu not amplified  -Staging CT scans 03/20/2013 confirmed a distal esophageal/gastric cardia mass, distal paraesophageal lymph nodes and paratracheal nodes concerning for metastatic lymphadenopathy  -A PET scan 03/27/2013 with a multifocal area of hypermetabolic activity involving the thoracic esophagus extending into the gastric cardia and hypermetabolic mediastinal nodes, no distant metastatic disease  -Initiation of radiation 04/07/2013 and weekly Taxol/carboplatin 04/08/2013, radiation completed 05/15/2013  -Restaging PET scan 06/09/2013-no evidence of  metastatic disease, no hypermetabolic lymphadenopathy  -Esophagogastrectomy 07/07/2013 revealed a ypT1b,ypN0 tumor with negative surgical margins  -CT neck 06/09/2014 revealed a necrotic lymph node in the right tracheoesophageal groove  -PET scan 11/15/2014 revealed a single focus of hypermetabolism corresponding to the necrotic right upper mediastinum lymph node -EUS biopsy of the paraesophageal lymph node 06/25/2012 confirmed metastatic adenocarcinoma  -Radiation completed 08/11/2014 -Rising CEA May 2016 -PET scan 07/13/2015 with hypermetabolic right paratracheal and right hilar adenopathy -Cycle 1 FOLFOX 08/03/2015 -Cycle 2 FOLFOX 08/17/2015 - cycle 3 FOLFOX 08/31/2015 (oxaliplatin dose reduced secondary to thrombocytopenia)  -Cycle 4 FOLFOX 09/14/2015 -Cycle 5 FOLFOX 09/28/2015 - restaging CTs 09/30/2015 with stable disease involving neck/chest lymph nodes, a slight increase in size of left upper lobe lung nodule  - cycle 6 FOLFOX 10/12/2015  -FOLFOX discontinued secondary to development of bilateral vocal cord paralysis November 2016 - CT scan of the neck and chest on 11/22/15 revealed no definite disease in the neck. At the thoracic inlet region, there is questionably slight increase in prominence of soft tissue at the right tracheoesophageal groove. This could be an enlarging node or mass but could also relate to the esophagectomy and pull-through surgery.Enlarging lingular and left lower lobe nodules are worrisome for metastatic disease. Peribronchovascular nodular consolidation in the anterior right lower lobe, slightly more prominent, indeterminate. Cholelithiasis. Left diaphragmatic hernia, stable. -Xeloda 1 week on/1 week off beginning 12/11/2015 2. Indeterminate 15 mm right liver lesion on the CT scan 03/20/2013, MRI of the liver 06/09/2013 confirmed a right liver hemangioma  3. Anorexia/weight loss and solid dysphagia secondary to #1 -improved 4. History of a colon polyp,  status post a polypectomy October 2013  5. history of Thrombocytopenia secondary chemotherapy-the carboplatin wase dose reduced with week 5 of  chemotherapy chemotherapy  6. hoarseness secondary to right vocal cord paralysis-CT/PET scan imaging consistent with a malignant right paratracheal lymph node causing the vocal cord paralysis  -Status post a laryngeal plasty at [facility] on 09/02/2014 7. Esophageal stricture-status post dilation 06/08/2015 -Food impaction removal 09/29/2015 -Esophageal stricture dilation 10/06/2015 8. Port-A-Cath placement 07/20/2015 9. Delayed nausea following cycle 1 FOLFOX. Aloxi and Emend added with cycle 2. 10. Thrombocytopenia secondary to chemotherapy  11. Emotional lability/depression-prophylactic Decadron discontinued; Remeron initiated 12. 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 He continues Xeloda 1 week on/1 week off. We will follow-up on the CEA from today. The plan is for restaging studies after he has been on Xeloda for 2-1/2 to 3 months.  The increased numbness/tingling in his hands and feet is most likely related to prior oxaliplatin.  He will return for a follow-up visit and labs in one month. He will contact the office in the interim with any problems. Port-A-Cath flushed at today's visit.  Patient seen with Dr. Benay Spice.    Marquett, Bertoli ANP/GNP-BC   01/06/2016  12:27 PM This was a shared visit with Ned Card. 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 The plan is to continue Xeloda for approximately 3 months prior to a restaging evaluation.  I communicated with Dr. Fanny Skates this week. The immunotherapy trial is not yet open. We will contact her when the restaging evaluation is complete.  Julieanne Manson, M.D.

## 2016-01-06 NOTE — Telephone Encounter (Signed)
[facility] called to inquire about Xeloda prescription for Mr. Mutz. Spoke with Arlene she stated prescription was ready to be sent. Waiting to speak with Mr.Kallman to confirm. Called Mrs. Rote she stated they were on the telephone with [facility] and everything was taken care of.

## 2016-01-06 NOTE — Progress Notes (Signed)
Ready to start clinical trial for immunotherapy getting discouraged

## 2016-01-06 NOTE — Patient Instructions (Signed)

## 2016-01-06 NOTE — Telephone Encounter (Signed)
Pt confirmed labs/ov/flush per 01/26 POF, gave pt AVS and Calendar... KJ

## 2016-01-07 LAB — CEA: CEA1: 265.2 ng/mL — AB (ref 0.0–4.7)

## 2016-01-11 ENCOUNTER — Telehealth: Payer: Self-pay | Admitting: *Deleted

## 2016-01-11 NOTE — Telephone Encounter (Signed)
-----   Message from Ladell Pier, MD sent at 01/10/2016  5:45 PM EST ----- Please call patient, cea is slightly higher, repeat approx. 3 weeks, will scheduled CTs prior to next office visit if higher again

## 2016-01-11 NOTE — Telephone Encounter (Signed)
Spoke with pt's wife, informed of CEA result. Appt given for 2/20 @ 1PM for lab/ flush. Will call with result/ plan. Will keep office visit 2/23 as scheduled. She voiced understanding.

## 2016-01-13 ENCOUNTER — Telehealth: Payer: Self-pay | Admitting: Oncology

## 2016-01-13 NOTE — Telephone Encounter (Signed)
Labs/flush moved to 02/20 per 02/02 POF, pt's wife is aware of new time.... KJ

## 2016-01-14 ENCOUNTER — Other Ambulatory Visit: Payer: Self-pay | Admitting: Oncology

## 2016-01-19 ENCOUNTER — Telehealth: Payer: Self-pay | Admitting: Pharmacist

## 2016-01-19 NOTE — Telephone Encounter (Signed)
01/19/16: Attempted to reach patient for follow up on oral medication: Xeloda. No answer. Left VM for patient to call back with any questions or issues.   Thank you,  Montel Clock, PharmD, [facility] [phone]

## 2016-01-26 ENCOUNTER — Encounter: Payer: Self-pay | Admitting: Pharmacist

## 2016-01-26 NOTE — Progress Notes (Signed)
Oral Chemotherapy Follow-Up Form  Original Start date of oral chemotherapy: _12/31/2017__   Called patient today to follow up regarding patient's oral chemotherapy medication: _Xeloda_  Pt is doing well today. Spoke with Patient's wife. No issues or complaints regarding xeloda. Lab work scheduled for next Monday to recheck CEA levels. No missed doses. Continues on one week on/one week off schedule.   Pt reports _0__ tablets/doses missed in the last month.    Pt reports the following side effects: __none____  Other Issues: _CEA level/waiting on immunotherapy trial to open for enrollment___   Will follow up and call patient again in _2 weeks___   Thank you,  Montel Clock, PharmD, [facility]

## 2016-01-28 ENCOUNTER — Telehealth: Payer: Self-pay | Admitting: *Deleted

## 2016-01-28 NOTE — Telephone Encounter (Signed)
Pt left message "tell Dr. Benay Spice that Dr. Fanny Skates at [facility] wants him to order a PET or CT Scan; call me back"  Note to Dr. Benay Spice

## 2016-01-31 ENCOUNTER — Other Ambulatory Visit (HOSPITAL_BASED_OUTPATIENT_CLINIC_OR_DEPARTMENT_OTHER): Payer: BLUE CROSS/BLUE SHIELD

## 2016-01-31 ENCOUNTER — Ambulatory Visit (HOSPITAL_BASED_OUTPATIENT_CLINIC_OR_DEPARTMENT_OTHER): Payer: BLUE CROSS/BLUE SHIELD

## 2016-01-31 ENCOUNTER — Telehealth: Payer: Self-pay | Admitting: *Deleted

## 2016-01-31 VITALS — BP 134/71 | HR 112 | Temp 99.1°F

## 2016-01-31 DIAGNOSIS — C155 Malignant neoplasm of lower third of esophagus: Secondary | ICD-10-CM

## 2016-01-31 DIAGNOSIS — Z452 Encounter for adjustment and management of vascular access device: Secondary | ICD-10-CM

## 2016-01-31 DIAGNOSIS — Z95828 Presence of other vascular implants and grafts: Secondary | ICD-10-CM

## 2016-01-31 LAB — CBC WITH DIFFERENTIAL/PLATELET
BASO%: 0.3 % (ref 0.0–2.0)
Basophils Absolute: 0 10*3/uL (ref 0.0–0.1)
EOS ABS: 0.2 10*3/uL (ref 0.0–0.5)
EOS%: 3.3 % (ref 0.0–7.0)
HEMATOCRIT: 39.8 % (ref 38.4–49.9)
HGB: 13.3 g/dL (ref 13.0–17.1)
LYMPH#: 1 10*3/uL (ref 0.9–3.3)
LYMPH%: 15.7 % (ref 14.0–49.0)
MCH: 30.9 pg (ref 27.2–33.4)
MCHC: 33.4 g/dL (ref 32.0–36.0)
MCV: 92.3 fL (ref 79.3–98.0)
MONO#: 0.6 10*3/uL (ref 0.1–0.9)
MONO%: 8.8 % (ref 0.0–14.0)
NEUT%: 71.9 % (ref 39.0–75.0)
NEUTROS ABS: 4.6 10*3/uL (ref 1.5–6.5)
PLATELETS: 134 10*3/uL — AB (ref 140–400)
RBC: 4.31 10*6/uL (ref 4.20–5.82)
RDW: 15.5 % — ABNORMAL HIGH (ref 11.0–14.6)
WBC: 6.4 10*3/uL (ref 4.0–10.3)

## 2016-01-31 LAB — COMPREHENSIVE METABOLIC PANEL
ALBUMIN: 3.8 g/dL (ref 3.5–5.0)
ALK PHOS: 94 U/L (ref 40–150)
ALT: 11 U/L (ref 0–55)
ANION GAP: 9 meq/L (ref 3–11)
AST: 12 U/L (ref 5–34)
BILIRUBIN TOTAL: 0.4 mg/dL (ref 0.20–1.20)
BUN: 15.2 mg/dL (ref 7.0–26.0)
CALCIUM: 8.9 mg/dL (ref 8.4–10.4)
CO2: 25 meq/L (ref 22–29)
CREATININE: 0.8 mg/dL (ref 0.7–1.3)
Chloride: 106 mEq/L (ref 98–109)
Glucose: 158 mg/dl — ABNORMAL HIGH (ref 70–140)
Potassium: 3.7 mEq/L (ref 3.5–5.1)
Sodium: 140 mEq/L (ref 136–145)
TOTAL PROTEIN: 7.2 g/dL (ref 6.4–8.3)

## 2016-01-31 MED ORDER — HEPARIN SOD (PORK) LOCK FLUSH 100 UNIT/ML IV SOLN
500.0000 [IU] | Freq: Once | INTRAVENOUS | Status: AC
  Administered 2016-01-31: 500 [IU] via INTRAVENOUS
  Filled 2016-01-31: qty 5

## 2016-01-31 MED ORDER — SODIUM CHLORIDE 0.9% FLUSH
10.0000 mL | INTRAVENOUS | Status: DC | PRN
  Administered 2016-01-31: 10 mL via INTRAVENOUS
  Filled 2016-01-31: qty 10

## 2016-01-31 NOTE — Telephone Encounter (Signed)
On 02/03/16 Dr. Benay Spice will discuss Pet scan/CT scan with patient and wife.  Wife verbalized understanding.

## 2016-02-01 LAB — CEA (PARALLEL TESTING): CEA: 218.9 ng/mL — ABNORMAL HIGH

## 2016-02-01 LAB — CEA: CEA: 290.5 ng/mL — ABNORMAL HIGH (ref 0.0–4.7)

## 2016-02-02 ENCOUNTER — Telehealth: Payer: Self-pay

## 2016-02-02 NOTE — Telephone Encounter (Signed)
Per Dr. Benay Spice: Will address refill at visit tomorrow. Attempted to inform pt, no answer.

## 2016-02-02 NOTE — Telephone Encounter (Signed)
Pt called requesting xeloda refill. He also requested CEA lab results. Result given to him without any interpretation. He has appt 2/23, he clarified the time.

## 2016-02-03 ENCOUNTER — Ambulatory Visit (HOSPITAL_BASED_OUTPATIENT_CLINIC_OR_DEPARTMENT_OTHER): Payer: BLUE CROSS/BLUE SHIELD | Admitting: Oncology

## 2016-02-03 ENCOUNTER — Other Ambulatory Visit: Payer: BLUE CROSS/BLUE SHIELD

## 2016-02-03 ENCOUNTER — Telehealth: Payer: Self-pay | Admitting: Oncology

## 2016-02-03 VITALS — BP 128/76 | HR 93 | Temp 98.4°F | Resp 18 | Ht 72.0 in | Wt 204.1 lb

## 2016-02-03 DIAGNOSIS — C155 Malignant neoplasm of lower third of esophagus: Secondary | ICD-10-CM | POA: Diagnosis not present

## 2016-02-03 NOTE — Telephone Encounter (Signed)
Pt confirmed office visit per 02/23 POF, gave pt AVS and Calendar... KJ, pt was scheduled for CT for Monday and gave pt barium, sent msg to [facility] D. to override NP/LT at 8:45 on 02/27...Marland Kitchen J

## 2016-02-03 NOTE — Progress Notes (Signed)
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 No mouth sores, diarrhea, or hand/foot pain. He is eating as tolerated. He was evaluated at [facility] on 02/01/2016 after coughing up bright red blood.the tracheotomy tube was changed and was treated with Augmentin for tracheitis.  He reports Dr. Fanny Skates would like to proceed with a restaging evaluation.    Objective:  Vital signs in last 24 hours:  Blood pressure 128/76, pulse 93, temperature 98.4 F (36.9 C), temperature source Oral, resp. rate 18, height 6' (1.829 m), weight 204 lb 1.6 oz (92.579 kg), SpO2 99 %.    HEENT: no thrush or ulcers, neck without mass Lymphatics: no cervical, supra-clavicular, axillary, or inguinal nodes Resp: lungs clear bilaterally Cardio: regular rate and rhythm GI: no hepatomegaly Vascular: no leg edema  Skin:Palms without erythema     Lab Results:  Lab Results  Component Value Date   WBC 6.4 01/31/2016   HGB 13.3 01/31/2016   HCT 39.8 01/31/2016   MCV 92.3 01/31/2016   PLT 134* 01/31/2016   NEUTROABS 4.6 01/31/2016     Lab Results  Component Value Date   CEA1 290.5* 01/31/2016    Medications: I have reviewed the patient's current medications.  Assessment/Plan: 1.Adenocarcinoma of the distal esophagus/gastric cardia-status post an endoscopic biopsy on 03/18/2013 confirming adenocarcinoma, HER-2/neu not amplified  -Staging CT scans 03/20/2013 confirmed a distal esophageal/gastric cardia mass, distal paraesophageal lymph nodes and paratracheal nodes concerning for metastatic lymphadenopathy  -A PET scan 03/27/2013 with a multifocal area of hypermetabolic activity involving the thoracic esophagus extending into the gastric cardia and hypermetabolic mediastinal nodes, no distant metastatic disease  -Initiation of radiation 04/07/2013 and weekly Taxol/carboplatin  04/08/2013, radiation completed 05/15/2013  -Restaging PET scan 06/09/2013-no evidence of metastatic disease, no hypermetabolic lymphadenopathy  -Esophagogastrectomy 07/07/2013 revealed a ypT1b,ypN0 tumor with negative surgical margins  -CT neck 06/09/2014 revealed a necrotic lymph node in the right tracheoesophageal groove  -PET scan 11/15/2014 revealed a single focus of hypermetabolism corresponding to the necrotic right upper mediastinum lymph node -EUS biopsy of the paraesophageal lymph node 06/25/2012 confirmed metastatic adenocarcinoma  -Radiation completed 08/11/2014 -Rising CEA May 2016 -PET scan 07/13/2015 with hypermetabolic right paratracheal and right hilar adenopathy -Cycle 1 FOLFOX 08/03/2015 -Cycle 2 FOLFOX 08/17/2015 - cycle 3 FOLFOX 08/31/2015 (oxaliplatin dose reduced secondary to thrombocytopenia)  -Cycle 4 FOLFOX 09/14/2015 -Cycle 5 FOLFOX 09/28/2015 - restaging CTs 09/30/2015 with stable disease involving neck/chest lymph nodes, a slight increase in size of left upper lobe lung nodule  - cycle 6 FOLFOX 10/12/2015  -FOLFOX discontinued secondary to development of bilateral vocal cord paralysis November 2016 - CT scan of the neck and chest on 11/22/15 revealed no definite disease in the neck. At the thoracic inlet region, there is questionably slight increase in prominence of soft tissue at the right tracheoesophageal groove. This could be an enlarging node or mass but could also relate to the esophagectomy and pull-through surgery.Enlarging lingular and left lower lobe nodules are worrisome for metastatic disease. Peribronchovascular nodular consolidation in the anterior right lower lobe, slightly more prominent, indeterminate. Cholelithiasis. Left diaphragmatic hernia, stable. -Xeloda 1 week on/1 week off beginning 12/11/2015 2. Indeterminate 15 mm right liver lesion on the CT scan 03/20/2013, MRI of the liver 06/09/2013 confirmed a right liver hemangioma  3.  Anorexia/weight loss and solid dysphagia secondary to #1 -improved 4. History of a colon polyp, status post  a polypectomy October 2013  5. history of Thrombocytopenia secondary chemotherapy-the carboplatin wase dose reduced with week 5 of chemotherapy chemotherapy  6. hoarseness secondary to right vocal cord paralysis-CT/PET scan imaging consistent with a malignant right paratracheal lymph node causing the vocal cord paralysis  -Status post a laryngeal plasty at [facility] on 09/02/2014 7. Esophageal stricture-status post dilation 06/08/2015 -Food impaction removal 09/29/2015 -Esophageal stricture dilation 10/06/2015 8. Port-A-Cath placement 07/20/2015 9. Delayed nausea following cycle 1 FOLFOX. Aloxi and Emend added with cycle 2. 10. Thrombocytopenia secondary to chemotherapy  11. Emotional lability/depression-prophylactic Decadron discontinued; Remeron initiated 12. 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 He has been maintained on Xeloda for the past 2 months. He is anxious to proceed with a restaging CT evaluation. The CEA is higher.  He will be scheduled for a restaging CT of the neck and chest prior to an office visit on 02/07/2016.we will then decide on continuing Xeloda versus referring him to Dr. Fanny Skates for a clinical trial.  Betsy Coder, MD  02/03/2016  10:48 AM

## 2016-02-07 ENCOUNTER — Other Ambulatory Visit: Payer: Self-pay | Admitting: Oncology

## 2016-02-07 ENCOUNTER — Ambulatory Visit: Payer: BLUE CROSS/BLUE SHIELD | Admitting: Nurse Practitioner

## 2016-02-07 ENCOUNTER — Encounter (HOSPITAL_COMMUNITY): Payer: Self-pay

## 2016-02-07 ENCOUNTER — Other Ambulatory Visit: Payer: Self-pay | Admitting: Nurse Practitioner

## 2016-02-07 ENCOUNTER — Ambulatory Visit (HOSPITAL_COMMUNITY)
Admission: RE | Admit: 2016-02-07 | Discharge: 2016-02-07 | Disposition: A | Discharge status: Home / Self Care | Payer: BLUE CROSS/BLUE SHIELD | Source: Ambulatory Visit | Attending: Oncology | Admitting: Oncology

## 2016-02-07 ENCOUNTER — Telehealth: Payer: Self-pay | Admitting: Oncology

## 2016-02-07 DIAGNOSIS — M4802 Spinal stenosis, cervical region: Secondary | ICD-10-CM | POA: Insufficient documentation

## 2016-02-07 DIAGNOSIS — R590 Localized enlarged lymph nodes: Secondary | ICD-10-CM | POA: Diagnosis not present

## 2016-02-07 DIAGNOSIS — Z9889 Other specified postprocedural states: Secondary | ICD-10-CM | POA: Diagnosis not present

## 2016-02-07 DIAGNOSIS — Z930 Tracheostomy status: Secondary | ICD-10-CM | POA: Diagnosis not present

## 2016-02-07 DIAGNOSIS — C155 Malignant neoplasm of lower third of esophagus: Secondary | ICD-10-CM | POA: Insufficient documentation

## 2016-02-07 DIAGNOSIS — R918 Other nonspecific abnormal finding of lung field: Secondary | ICD-10-CM | POA: Diagnosis not present

## 2016-02-07 DIAGNOSIS — K8020 Calculus of gallbladder without cholecystitis without obstruction: Secondary | ICD-10-CM | POA: Insufficient documentation

## 2016-02-07 MED ORDER — IOHEXOL 300 MG/ML  SOLN
75.0000 mL | Freq: Once | INTRAMUSCULAR | Status: AC | PRN
  Administered 2016-02-07: 75 mL via INTRAVENOUS

## 2016-02-07 NOTE — Telephone Encounter (Signed)
Spoke with patient to confirm 2/28 appt date/time per 2/27 pof

## 2016-02-08 ENCOUNTER — Ambulatory Visit (HOSPITAL_BASED_OUTPATIENT_CLINIC_OR_DEPARTMENT_OTHER): Payer: BLUE CROSS/BLUE SHIELD | Admitting: Oncology

## 2016-02-08 ENCOUNTER — Telehealth: Payer: Self-pay | Admitting: Oncology

## 2016-02-08 VITALS — BP 132/78 | HR 101 | Temp 98.5°F | Resp 16 | Ht 72.0 in | Wt 202.7 lb

## 2016-02-08 DIAGNOSIS — C155 Malignant neoplasm of lower third of esophagus: Secondary | ICD-10-CM

## 2016-02-08 DIAGNOSIS — R630 Anorexia: Secondary | ICD-10-CM

## 2016-02-08 DIAGNOSIS — D696 Thrombocytopenia, unspecified: Secondary | ICD-10-CM

## 2016-02-08 DIAGNOSIS — R634 Abnormal weight loss: Secondary | ICD-10-CM

## 2016-02-08 NOTE — Telephone Encounter (Signed)
appt made and avs printed °

## 2016-02-08 NOTE — Progress Notes (Signed)
[place] OFFICE PROGRESS NOTE   Diagnosis: Esophagus cancer  INTERVAL HISTORY:   He returns as scheduled. He reports malaise. No new complaint.  Objective:  Vital signs in last 24 hours:  Blood pressure 132/78, pulse 101, temperature 98.5 F (36.9 C), temperature source Oral, resp. rate 16, height 6' (1.829 m), weight 202 lb 11.2 oz (91.944 kg), SpO2 98 %.    HEENT: Neck without mass, tracheostomy Lymphatics: No cervical, supraclavicular, or axillary nodes Resp: Good air movement bilaterally, scattered inspiratory rhonchi, no respiratory distress Cardio: Regular rate and rhythm GI: No hepatomegaly Vascular: No leg edema   Portacath/PICC-without erythema  Lab Results:  Lab Results  Component Value Date   WBC 6.4 01/31/2016   HGB 13.3 01/31/2016   HCT 39.8 01/31/2016   MCV 92.3 01/31/2016   PLT 134* 01/31/2016   NEUTROABS 4.6 01/31/2016      Lab Results  Component Value Date   CEA1 290.5* 01/31/2016    Imaging:  Ct Soft Tissue Neck W Contrast  02/07/2016  ADDENDUM REPORT: 02/07/2016 11:48 ADDENDUM: As noted in the second impression of initial dictation, the lucency involving the right tracheal cartilage is most likely explained by procedure for right vocal cord paralysis with injection of material (Teflon) for subsequent medialization of the right vocal cord. Metastatic disease felt unlikely at this level. Electronically Signed   By: Genia Del M.D.   On: 02/07/2016 11:48  02/07/2016  CLINICAL DATA:  50 year old male with esophageal cancer 2014. Chemotherapy and radiation therapy completed. Esophageal resection. Tracheostomy. Cough since November 2016. Dysphagia. Subsequent encounter. EXAM: CT NECK WITH CONTRAST TECHNIQUE: Multidetector CT imaging of the neck was performed using the standard protocol following the bolus administration of intravenous contrast. CONTRAST:  71m OMNIPAQUE IOHEXOL 300 MG/ML  SOLN COMPARISON:  11/22/2015 neck CT.  06/15/2014 PET-CT. CT chest performed same date dictated separately. FINDINGS: Pharynx and larynx: New from the prior PET-CT and without change from the recent neck CT is a 5 mm lucency of the right thyroid cartilage with bony overgrowth inwardly displacing the right vocal cord. This may represent result of procedure. If the patient did not have a procedure performed at this level than metastatic disease could not be excluded as cause of this finding. Calcifications palatine tonsils greater on left unchanged consistent with prior inflammation. Minimal asymmetry of the palatine tonsils similar to prior exam without discrete mass. Post esophagectomy with gastric pull-up.  Tracheostomy in place. Salivary glands: Negative. Thyroid: Limited by streak artifact from the tracheostomy. No obvious mass. Lymph nodes: Fullness lower right tracheoesophageal groove similar to prior neck CT exam may represent result of surgery, treated tumor or metastatic disease. Evaluation limited by streak artifact. This is in the region where patient had abnormality on 2015 PET-CT. Vascular: Right central line in place. Mildly ectatic right carotid artery causes slight impression upon the right posterior lateral aspect of the pharynx. Limited intracranial: Negative. Visualized orbits: Negative. Mastoids and visualized paranasal sinuses: Polypoid mucosal thickening maxillary sinuses most notable inferior aspect right maxillary sinus. Adjacent dental disease. Skeleton: Cervical spondylotic changes with spinal stenosis and foraminal narrowing C5-6 and C6-7. Upper chest: Please see chest CT report. IMPRESSION: Post esophagectomy with gastric pull-up. Tracheostomy in place. New from the prior PET-CT and without change from the recent neck CT is a 5 mm lucency of the right thyroid cartilage with bony overgrowth inwardly displacing the right vocal cord. This may represent result of prior procedure. If the patient did not have a procedure performed  at  this level than metastatic disease could not be excluded as cause of this finding. Fullness lower right tracheoesophageal groove similar to prior neck CT exam may represent result of surgery, treated tumor or metastatic disease. Evaluation limited by streak artifact. This is in the region where patient had abnormality on 2015 PET-CT. Polypoid mucosal thickening maxillary sinuses most notable inferior aspect right maxillary sinus. Adjacent dental disease. Cervical spondylotic changes with spinal stenosis and foraminal narrowing C5-6 and C6-7. Please see chest CT report dictated separately. Electronically Signed: By: Genia Del M.D. On: 02/07/2016 11:38   Ct Chest W Contrast  02/07/2016  CLINICAL DATA:  Esophageal cancer, chemotherapy and radiation therapy complete. Cough since November with aphasia. EXAM: CT CHEST WITH CONTRAST TECHNIQUE: Multidetector CT imaging of the chest was performed during intravenous contrast administration. CONTRAST:  1m OMNIPAQUE IOHEXOL 300 MG/ML  SOLN COMPARISON:  11/22/2015. FINDINGS: Mediastinum/Nodes: Right IJ Port-A-Cath terminates in the high right atrium. Tracheostomy is midline. Mediastinal lymph nodes measure up to 8 mm in the lower right paratracheal station, stable. Right hilar adenopathy measures up to 1.7 cm (image 36), likely stable. No left hilar or axillary adenopathy. Heart size normal. No pericardial effusion. Esophagectomy and gastric pull-through. Lungs/Pleura: Minimal subpleural nodular consolidation in the anterior right lower lobe (series 7, image 44), unchanged. Lingular nodule measures 11 mm (image 31), previously 8 mm. Medial right lower lobe nodule measures 1.1 x 1.9 cm (image 33), previously 0.8 x 1.2 cm. Mild volume loss in the adjacent left lower lobe, as before. No pleural fluid. Airway is otherwise unremarkable. Upper abdomen: Visualized portion of the liver is unremarkable. Small stones in the gallbladder. Visualized portions of the adrenal glands,  right kidney and spleen are unremarkable. There is a hiatal hernia extending into the lower left chest, containing pancreas and colon, as before. No pathologically enlarged lymph nodes. Musculoskeletal: No worrisome lytic or sclerotic lesions. IMPRESSION: 1. Esophagectomy and gastric pull-through. Enlarging left upper and left lower lobe nodules, most consistent with progressive metastatic disease. 2. Right hilar adenopathy appears grossly stable. 3. Cholelithiasis. Electronically Signed   By: MLorin PicketM.D.   On: 02/07/2016 11:07    Medications: I have reviewed the patient's current medications.  Assessment/Plan: 1.Adenocarcinoma of the distal esophagus/gastric cardia-status post an endoscopic biopsy on 03/18/2013 confirming adenocarcinoma, HER-2/neu not amplified  -Staging CT scans 03/20/2013 confirmed a distal esophageal/gastric cardia mass, distal paraesophageal lymph nodes and paratracheal nodes concerning for metastatic lymphadenopathy  -A PET scan 03/27/2013 with a multifocal area of hypermetabolic activity involving the thoracic esophagus extending into the gastric cardia and hypermetabolic mediastinal nodes, no distant metastatic disease  -Initiation of radiation 04/07/2013 and weekly Taxol/carboplatin 04/08/2013, radiation completed 05/15/2013  -Restaging PET scan 06/09/2013-no evidence of metastatic disease, no hypermetabolic lymphadenopathy  -Esophagogastrectomy 07/07/2013 revealed a ypT1b,ypN0 tumor with negative surgical margins  -CT neck 06/09/2014 revealed a necrotic lymph node in the right tracheoesophageal groove  -PET scan 11/15/2014 revealed a single focus of hypermetabolism corresponding to the necrotic right upper mediastinum lymph node -EUS biopsy of the paraesophageal lymph node 06/25/2012 confirmed metastatic adenocarcinoma  -Radiation completed 08/11/2014 -Rising CEA May 2016 -PET scan 07/13/2015 with hypermetabolic right paratracheal and right hilar  adenopathy -Cycle 1 FOLFOX 08/03/2015 -Cycle 2 FOLFOX 08/17/2015 - cycle 3 FOLFOX 08/31/2015 (oxaliplatin dose reduced secondary to thrombocytopenia)  -Cycle 4 FOLFOX 09/14/2015 -Cycle 5 FOLFOX 09/28/2015 - restaging CTs 09/30/2015 with stable disease involving neck/chest lymph nodes, a slight increase in size of left upper lobe lung nodule  -  cycle 6 FOLFOX 10/12/2015  -FOLFOX discontinued secondary to development of bilateral vocal cord paralysis November 2016 - CT scan of the neck and chest on 11/22/15 revealed no definite disease in the neck. At the thoracic inlet region, there is questionably slight increase in prominence of soft tissue at the right tracheoesophageal groove. This could be an enlarging node or mass but could also relate to the esophagectomy and pull-through surgery.Enlarging lingular and left lower lobe nodules are worrisome for metastatic disease. Peribronchovascular nodular consolidation in the anterior right lower lobe, slightly more prominent, indeterminate. Cholelithiasis. Left diaphragmatic hernia, stable. -Xeloda 1 week on/1 week off beginning 12/11/2015 -Restaging CTs of the neck and chest 02/07/2016 revealed enlargement of 2 left-sided lung nodules, no other evidence of disease progression 2. Indeterminate 15 mm right liver lesion on the CT scan 03/20/2013, MRI of the liver 06/09/2013 confirmed a right liver hemangioma  3. Anorexia/weight loss and solid dysphagia secondary to #1 -improved 4. History of a colon polyp, status post a polypectomy October 2013  5. history of Thrombocytopenia secondary chemotherapy-the carboplatin wase dose reduced with week 5 of chemotherapy chemotherapy  6. hoarseness secondary to right vocal cord paralysis-CT/PET scan imaging consistent with a malignant right paratracheal lymph node causing the vocal cord paralysis  -Status post a laryngeal plasty at [facility] on 09/02/2014 7. Esophageal stricture-status post dilation 06/08/2015 -Food  impaction removal 09/29/2015 -Esophageal stricture dilation 10/06/2015 8. Port-A-Cath placement 07/20/2015 9. Delayed nausea following cycle 1 FOLFOX. Aloxi and Emend added with cycle 2. 10. Thrombocytopenia secondary to chemotherapy  11. Emotional lability/depression-prophylactic Decadron discontinued; Remeron initiated 12. Progressive hoarseness November 2016, diagnosed with bilateral vocal cord paralysis, status post a tracheostomy and bilateral injection laryngoplasty at [facility] on 10/27/2015     Disposition:  He appears unchanged. He completed 2 months of Xeloda therapy. The CEA is higher and the restaging CT reveals enlargement of left-sided lung nodules. 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 They are interested in proceeding with an immunotherapy trial at [facility] if this is available. We also discussed the possibility of off protocol immunotherapy. They asked about SRS to the lung nodules. I told him it was unlikely treating the lung nodules with radiation would impact on his survival given the high likelihood of systemic disease.  I will contact Dr. Fanny Skates with the restaging information to discuss the treatment plan.  He will return for office visit here in one month. We will see him sooner as needed.   Betsy Coder, MD  02/08/2016  9:51 AM

## 2016-02-14 ENCOUNTER — Encounter (HOSPITAL_COMMUNITY): Payer: Self-pay

## 2016-02-14 ENCOUNTER — Encounter: Payer: Self-pay | Admitting: *Deleted

## 2016-02-14 NOTE — Progress Notes (Signed)
°  Oncology Nurse Navigator Documentation  Navigator Location: [facility] (02/14/16 1136) Navigator Encounter Type: Letter/Fax/Email (02/14/16 1136) : Per MD request, sent email to [facility] requesting [facility] Testing on the following case:  Patient: Joel Osborne, Joel Osborne Collected: 07/07/2013 Client: [facility] Accession: [id number] Received: 07/07/2013 Joel Osborne DOB: 09-14-1966 Age: 50 Gender: M Reported: 07/09/2013 [address] Patient Ph: [phone] MRN #: [id number] [place], [place] [address] Visit #: [id number] Chart #: Phone:  Fax

## 2016-02-14 NOTE — Progress Notes (Signed)
Per Dr. Benay Spice, the 07/07/13 case did not have enough tissue. Requested to try the 03/18/13 biopsy tissue. Email to [place]  Patient: Joel Osborne, Joel Osborne Collected: 03/18/2013 Client: [facility] [place] Accession: [id number] Received: 03/21/2013 Scarlette Shorts, MD DOB: 1966-09-04 Age: 50 Gender: M Reported: 03/25/2013 [place] Patient Ph: [phone] MRN #: [id number] [place], [place] [address] Client Acc#: [id number] Chart #: [id number] Phone: [phone] Fax: CC: [facility] CC CC: Leanna Battles, MD REPORT OF SURGICAL PATHOLOGY

## 2016-02-16 ENCOUNTER — Other Ambulatory Visit: Payer: Self-pay | Admitting: Oncology

## 2016-02-16 ENCOUNTER — Encounter: Payer: Self-pay | Admitting: Oncology

## 2016-02-16 ENCOUNTER — Telehealth: Payer: Self-pay | Admitting: *Deleted

## 2016-02-16 NOTE — Progress Notes (Signed)
Placed Beryle Flock app for dr. Benay Spice to sign

## 2016-02-16 NOTE — Telephone Encounter (Signed)
Called pt. Dr. Benay Spice discussed case with Dr. Fanny Skates: Trial is not open. Options are to try to get drug off protocol, or try immunotherapy with Keytruda or Nivolumab. Will review with Onc Pharmacist to begin process to obtain approval for this. Pt voiced understanding and appreciation for call.

## 2016-02-17 ENCOUNTER — Telehealth: Payer: Self-pay | Admitting: *Deleted

## 2016-02-17 ENCOUNTER — Other Ambulatory Visit: Payer: BLUE CROSS/BLUE SHIELD

## 2016-02-17 ENCOUNTER — Other Ambulatory Visit: Payer: Self-pay | Admitting: Oncology

## 2016-02-17 ENCOUNTER — Telehealth: Payer: Self-pay | Admitting: Oncology

## 2016-02-17 ENCOUNTER — Other Ambulatory Visit: Payer: Self-pay | Admitting: *Deleted

## 2016-02-17 NOTE — Telephone Encounter (Signed)
Per staff message and POF I have scheduled appts. Advised scheduler of appts. JMW  

## 2016-02-17 NOTE — Telephone Encounter (Signed)
perp of to sch trmt-sent to MW to sch trmt-will call pt after reply

## 2016-02-17 NOTE — Telephone Encounter (Signed)
cld & spoke to pt and gave pt time & date of appt

## 2016-02-17 NOTE — Telephone Encounter (Signed)
Informed pt that schedulers will contact him with MD and Keytruda infusion appt for next week. Managed care is working on obtaining pt assistance or replacement drug. He voiced understanding.

## 2016-02-22 ENCOUNTER — Other Ambulatory Visit: Payer: Self-pay | Admitting: Oncology

## 2016-02-22 ENCOUNTER — Other Ambulatory Visit: Payer: Self-pay | Admitting: *Deleted

## 2016-02-22 ENCOUNTER — Encounter: Payer: Self-pay | Admitting: Oncology

## 2016-02-22 NOTE — Progress Notes (Signed)
Spoke with wife and patient -he advised to call wife voula. She gave income info and they both gave ok to sign app for [employer] . Joel Osborne

## 2016-02-23 ENCOUNTER — Encounter: Payer: Self-pay | Admitting: Oncology

## 2016-02-23 NOTE — Progress Notes (Signed)
faxed [employer] [phone]

## 2016-02-23 NOTE — Progress Notes (Signed)
I left [employer] form for dr. Benay Spice for Joel Osborne

## 2016-02-24 ENCOUNTER — Other Ambulatory Visit: Payer: Self-pay | Admitting: *Deleted

## 2016-02-24 ENCOUNTER — Encounter: Payer: Self-pay | Admitting: Oncology

## 2016-02-24 ENCOUNTER — Telehealth: Payer: Self-pay | Admitting: *Deleted

## 2016-02-24 DIAGNOSIS — C155 Malignant neoplasm of lower third of esophagus: Secondary | ICD-10-CM

## 2016-02-24 DIAGNOSIS — C159 Malignant neoplasm of esophagus, unspecified: Secondary | ICD-10-CM

## 2016-02-24 NOTE — Telephone Encounter (Signed)
"  I need to know if they've contacted mi insurance about the [facility] medication.  If no confirmation for the [facility], I need to reschedule.  Very concerned about this  Mobile number [phone]." Voicemail left for Prior authorization specialist.

## 2016-02-24 NOTE — Telephone Encounter (Signed)
Mr. Migues notified that Beryle Flock will be covered under [employer] per R. Browning.  Pt appreciative of phone call.

## 2016-02-24 NOTE — Progress Notes (Signed)
Dr Benay Spice signed for off label use of keytruda. Application has been sent. We have to get a denial from insurance to get to [facility]. Drug replacement. I advised nurse he is ok for plan on 02/25/16

## 2016-02-25 ENCOUNTER — Ambulatory Visit (HOSPITAL_BASED_OUTPATIENT_CLINIC_OR_DEPARTMENT_OTHER): Payer: BLUE CROSS/BLUE SHIELD

## 2016-02-25 ENCOUNTER — Other Ambulatory Visit (HOSPITAL_BASED_OUTPATIENT_CLINIC_OR_DEPARTMENT_OTHER): Payer: BLUE CROSS/BLUE SHIELD

## 2016-02-25 ENCOUNTER — Ambulatory Visit (HOSPITAL_BASED_OUTPATIENT_CLINIC_OR_DEPARTMENT_OTHER): Payer: BLUE CROSS/BLUE SHIELD | Admitting: Oncology

## 2016-02-25 ENCOUNTER — Ambulatory Visit: Payer: BLUE CROSS/BLUE SHIELD

## 2016-02-25 VITALS — BP 112/66 | HR 92 | Temp 98.8°F | Resp 18 | Ht 72.0 in | Wt 197.8 lb

## 2016-02-25 DIAGNOSIS — C155 Malignant neoplasm of lower third of esophagus: Secondary | ICD-10-CM

## 2016-02-25 DIAGNOSIS — Z95828 Presence of other vascular implants and grafts: Secondary | ICD-10-CM

## 2016-02-25 DIAGNOSIS — Z5112 Encounter for antineoplastic immunotherapy: Secondary | ICD-10-CM | POA: Diagnosis not present

## 2016-02-25 DIAGNOSIS — C159 Malignant neoplasm of esophagus, unspecified: Secondary | ICD-10-CM

## 2016-02-25 LAB — COMPREHENSIVE METABOLIC PANEL
ALT: 11 U/L (ref 0–55)
AST: 15 U/L (ref 5–34)
Albumin: 4 g/dL (ref 3.5–5.0)
Alkaline Phosphatase: 112 U/L (ref 40–150)
Anion Gap: 9 mEq/L (ref 3–11)
BUN: 10 mg/dL (ref 7.0–26.0)
CO2: 27 meq/L (ref 22–29)
Calcium: 9.6 mg/dL (ref 8.4–10.4)
Chloride: 106 mEq/L (ref 98–109)
Creatinine: 0.8 mg/dL (ref 0.7–1.3)
GLUCOSE: 95 mg/dL (ref 70–140)
POTASSIUM: 4.1 meq/L (ref 3.5–5.1)
SODIUM: 141 meq/L (ref 136–145)
TOTAL PROTEIN: 7.8 g/dL (ref 6.4–8.3)
Total Bilirubin: 0.75 mg/dL (ref 0.20–1.20)

## 2016-02-25 LAB — CBC WITH DIFFERENTIAL/PLATELET
BASO%: 1.2 % (ref 0.0–2.0)
Basophils Absolute: 0.1 10*3/uL (ref 0.0–0.1)
EOS ABS: 0.2 10*3/uL (ref 0.0–0.5)
EOS%: 2.6 % (ref 0.0–7.0)
HCT: 43.4 % (ref 38.4–49.9)
HGB: 14.3 g/dL (ref 13.0–17.1)
LYMPH%: 13.3 % — AB (ref 14.0–49.0)
MCH: 29.8 pg (ref 27.2–33.4)
MCHC: 32.9 g/dL (ref 32.0–36.0)
MCV: 90.7 fL (ref 79.3–98.0)
MONO#: 0.8 10*3/uL (ref 0.1–0.9)
MONO%: 11.8 % (ref 0.0–14.0)
NEUT%: 71.1 % (ref 39.0–75.0)
NEUTROS ABS: 4.6 10*3/uL (ref 1.5–6.5)
Platelets: 184 10*3/uL (ref 140–400)
RBC: 4.79 10*6/uL (ref 4.20–5.82)
RDW: 15.6 % — ABNORMAL HIGH (ref 11.0–14.6)
WBC: 6.5 10*3/uL (ref 4.0–10.3)
lymph#: 0.9 10*3/uL (ref 0.9–3.3)

## 2016-02-25 MED ORDER — SODIUM CHLORIDE 0.9 % IV SOLN
200.0000 mg | Freq: Once | INTRAVENOUS | Status: AC
  Administered 2016-02-25: 200 mg via INTRAVENOUS
  Filled 2016-02-25: qty 8

## 2016-02-25 MED ORDER — SODIUM CHLORIDE 0.9 % IV SOLN
Freq: Once | INTRAVENOUS | Status: AC
  Administered 2016-02-25: 13:00:00 via INTRAVENOUS

## 2016-02-25 MED ORDER — SODIUM CHLORIDE 0.9% FLUSH
10.0000 mL | INTRAVENOUS | Status: DC | PRN
  Administered 2016-02-25: 10 mL via INTRAVENOUS
  Filled 2016-02-25: qty 10

## 2016-02-25 MED ORDER — SODIUM CHLORIDE 0.9% FLUSH
10.0000 mL | INTRAVENOUS | Status: DC | PRN
  Administered 2016-02-25: 10 mL
  Filled 2016-02-25: qty 10

## 2016-02-25 MED ORDER — HEPARIN SOD (PORK) LOCK FLUSH 100 UNIT/ML IV SOLN
500.0000 [IU] | Freq: Once | INTRAVENOUS | Status: AC | PRN
  Administered 2016-02-25: 500 [IU]
  Filled 2016-02-25: qty 5

## 2016-02-25 NOTE — Patient Instructions (Signed)
[facility] [facility] Discharge Instructions for Patients Receiving Chemotherapy  Today you received the following chemotherapy agents Beryle Flock  To help prevent nausea and vomiting after your treatment, we encourage you to take your nausea medication as directed   If you develop nausea and vomiting that is not controlled by your nausea medication, call the clinic.   BELOW ARE SYMPTOMS THAT SHOULD BE REPORTED IMMEDIATELY:  *FEVER GREATER THAN 100.5 F  *CHILLS WITH OR WITHOUT FEVER  NAUSEA AND VOMITING THAT IS NOT CONTROLLED WITH YOUR NAUSEA MEDICATION  *UNUSUAL SHORTNESS OF BREATH  *UNUSUAL BRUISING OR BLEEDING  TENDERNESS IN MOUTH AND THROAT WITH OR WITHOUT PRESENCE OF ULCERS  *URINARY PROBLEMS  *BOWEL PROBLEMS  UNUSUAL RASH Items with * indicate a potential emergency and should be followed up as soon as possible.  Feel free to call the clinic you have any questions or concerns. The clinic phone number is [phone].  Pembrolizumab injection What is this medicine? PEMBROLIZUMAB (pem broe liz ue mab) is a monoclonal antibody. It is used to treat melanoma and non-small cell lung cancer. This medicine may be used for other purposes; ask your health care provider or pharmacist if you have questions. What should I tell my health care provider before I take this medicine? They need to know if you have any of these conditions: -diabetes -immune system problems -inflammatory bowel disease -liver disease -lung or breathing disease -lupus -an unusual or allergic reaction to pembrolizumab, other medicines, foods, dyes, or preservatives -pregnant or trying to get pregnant -breast-feeding How should I use this medicine? This medicine is for infusion into a vein. It is given by a health care professional in a hospital or clinic setting. A special MedGuide will be given to you before each treatment. Be sure to read this information carefully each time. Talk to your  pediatrician regarding the use of this medicine in children. Special care may be needed. Overdosage: If you think you have taken too much of this medicine contact a poison control center or emergency room at once. NOTE: This medicine is only for you. Do not share this medicine with others. What if I miss a dose? It is important not to miss your dose. Call your doctor or health care professional if you are unable to keep an appointment. What may interact with this medicine? Interactions have not been studied. Give your health care provider a list of all the medicines, herbs, non-prescription drugs, or dietary supplements you use. Also tell them if you smoke, drink alcohol, or use illegal drugs. Some items may interact with your medicine. This list may not describe all possible interactions. Give your health care provider a list of all the medicines, herbs, non-prescription drugs, or dietary supplements you use. Also tell them if you smoke, drink alcohol, or use illegal drugs. Some items may interact with your medicine. What should I watch for while using this medicine? Your condition will be monitored carefully while you are receiving this medicine. You may need blood work done while you are taking this medicine. Do not become pregnant while taking this medicine or for 4 months after stopping it. Women should inform their doctor if they wish to become pregnant or think they might be pregnant. There is a potential for serious side effects to an unborn child. Talk to your health care professional or pharmacist for more information. Do not breast-feed an infant while taking this medicine or for 4 months after the last dose. What side effects may I  notice from receiving this medicine? Side effects that you should report to your doctor or health care professional as soon as possible: -allergic reactions like skin rash, itching or hives, swelling of the face, lips, or tongue -bloody or black, tarry  stools -breathing problems -change in the amount of urine -changes in vision -chest pain -chills -dark urine -dizziness or feeling faint or lightheaded -fast or irregular heartbeat -fever -flushing -hair loss -muscle pain -muscle weakness -persistent headache -signs and symptoms of high blood sugar such as dizziness; dry mouth; dry skin; fruity breath; nausea; stomach pain; increased hunger or thirst; increased urination -signs and symptoms of liver injury like dark urine, light-colored stools, loss of appetite, nausea, right upper belly pain, yellowing of the eyes or skin -stomach pain -weight loss Side effects that usually do not require medical attention (Report these to your doctor or health care professional if they continue or are bothersome.):constipation -cough -diarrhea -joint pain -tiredness This list may not describe all possible side effects. Call your doctor for medical advice about side effects. You may report side effects to FDA at 1-800-FDA-1088. Where should I keep my medicine? This drug is given in a hospital or clinic and will not be stored at home. NOTE: This sheet is a summary. It may not cover all possible information. If you have questions about this medicine, talk to your doctor, pharmacist, or health care provider.    2016, Elsevier/Gold Standard. (2015-01-26 17:24:19)

## 2016-02-25 NOTE — Patient Instructions (Signed)

## 2016-02-25 NOTE — Progress Notes (Signed)
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 He is not taking pain medication. He is working. He has communicated with Dr. Fanny Skates and would like to begin immunotherapy.   Objective:  Vital signs in last 24 hours:  Blood pressure 112/66, pulse 92, temperature 98.8 F (37.1 C), temperature source Oral, resp. rate 18, height 6' (1.829 m), weight 197 lb 12.8 oz (89.721 kg), SpO2 98 %.    HEENT:  tracheostomy, neck without mass Lymphatics:  no cervical or supraclavicular nodes Resp:  lungs clear bilaterally Cardio:  regular rate and rhythm GI:  no hepatomegaly, nontender Vascular:  no leg edema   Portacath/PICC-without erythema  Lab Results:  Lab Results  Component Value Date   WBC 6.5 02/25/2016   HGB 14.3 02/25/2016   HCT 43.4 02/25/2016   MCV 90.7 02/25/2016   PLT 184 02/25/2016   NEUTROABS 4.6 02/25/2016      Lab Results  Component Value Date   CEA1 290.5* 01/31/2016    Medications: I have reviewed the patient's current medications.  Assessment/Plan: 1.Adenocarcinoma of the distal esophagus/gastric cardia-status post an endoscopic biopsy on 03/18/2013 confirming adenocarcinoma, HER-2/neu not amplified  -Staging CT scans 03/20/2013 confirmed a distal esophageal/gastric cardia mass, distal paraesophageal lymph nodes and paratracheal nodes concerning for metastatic lymphadenopathy  -A PET scan 03/27/2013 with a multifocal area of hypermetabolic activity involving the thoracic esophagus extending into the gastric cardia and hypermetabolic mediastinal nodes, no distant metastatic disease  -Initiation of radiation 04/07/2013 and weekly Taxol/carboplatin 04/08/2013, radiation completed 05/15/2013  -Restaging PET scan 06/09/2013-no evidence of metastatic disease, no hypermetabolic lymphadenopathy  -Esophagogastrectomy 07/07/2013 revealed  a ypT1b,ypN0 tumor with negative surgical margins  -CT neck 06/09/2014 revealed a necrotic lymph node in the right tracheoesophageal groove  -PET scan 11/15/2014 revealed a single focus of hypermetabolism corresponding to the necrotic right upper mediastinum lymph node -EUS biopsy of the paraesophageal lymph node 06/25/2012 confirmed metastatic adenocarcinoma  -Radiation completed 08/11/2014 -Rising CEA May 2016 -PET scan 07/13/2015 with hypermetabolic right paratracheal and right hilar adenopathy -Cycle 1 FOLFOX 08/03/2015 -Cycle 2 FOLFOX 08/17/2015 - cycle 3 FOLFOX 08/31/2015 (oxaliplatin dose reduced secondary to thrombocytopenia)  -Cycle 4 FOLFOX 09/14/2015 -Cycle 5 FOLFOX 09/28/2015 - restaging CTs 09/30/2015 with stable disease involving neck/chest lymph nodes, a slight increase in size of left upper lobe lung nodule  - cycle 6 FOLFOX 10/12/2015  -FOLFOX discontinued secondary to development of bilateral vocal cord paralysis November 2016 - CT scan of the neck and chest on 11/22/15 revealed no definite disease in the neck. At the thoracic inlet region, there is questionably slight increase in prominence of soft tissue at the right tracheoesophageal groove. This could be an enlarging node or mass but could also relate to the esophagectomy and pull-through surgery.Enlarging lingular and left lower lobe nodules are worrisome for metastatic disease. Peribronchovascular nodular consolidation in the anterior right lower lobe, slightly more prominent, indeterminate. Cholelithiasis. Left diaphragmatic hernia, stable. -Xeloda 1 week on/1 week off beginning 12/11/2015 -Restaging CTs of the neck and chest 02/07/2016 revealed enlargement of 2 left-sided lung nodules, no other evidence of disease progression -Cycle 1  Pembrolizumab 02/25/2016  2. Indeterminate 15 mm right liver lesion on the CT scan 03/20/2013, MRI of the liver 06/09/2013 confirmed a right liver hemangioma  3. Anorexia/weight  loss and solid dysphagia secondary to #1 -improved 4. History of a colon polyp, status post a polypectomy October 2013  5. history of Thrombocytopenia secondary chemotherapy-the carboplatin wase dose reduced with week 5 of chemotherapy chemotherapy  6. hoarseness secondary to right vocal cord paralysis-CT/PET scan imaging consistent with a malignant right paratracheal lymph node causing the vocal cord paralysis  -Status post a laryngeal plasty at [facility] on 09/02/2014 7. Esophageal stricture-status post dilation 06/08/2015 -Food impaction removal 09/29/2015 -Esophageal stricture dilation 10/06/2015 8. Port-A-Cath placement 07/20/2015 9. Delayed nausea following cycle 1 FOLFOX. Aloxi and Emend added with cycle 2. 10. Thrombocytopenia secondary to chemotherapy  11. Emotional lability/depression-prophylactic Decadron discontinued; Remeron initiated 12. Progressive hoarseness November 2016, diagnosed with bilateral vocal cord paralysis, status post a tracheostomy and bilateral injection laryngoplasty at [facility] on 10/27/2015      Disposition:   He appears stable. He would like to begin a trial of pembrolizumab. We discussed the potential toxicities associated with this agent and he agrees to proceed. He understands the small chance of a clinical response.  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 He will contact us in the interim as needed. We obtained a baseline CEA and TSH today.  Betsy Coder, MD  02/25/2016  12:35 PM

## 2016-02-26 LAB — CEA: CEA1: 470.6 ng/mL — AB (ref 0.0–4.7)

## 2016-02-29 ENCOUNTER — Encounter: Payer: Self-pay | Admitting: Oncology

## 2016-02-29 NOTE — Progress Notes (Signed)
Per [employer]. Patient approved for keytruda drug replacment. 02/28/16-02/27/17.

## 2016-02-29 NOTE — Progress Notes (Signed)
Per wanda at [facility]. She is faxing script form to me again. She said have to get 3.17 reviewed by supervisor. She said they didn't get till 02/25/16-date of treatment. I advised her process started 02/21/16. She will be shipping the 03/17/16 dos 03/13/16 to be recd 03/14/16 in time for 03/17/16 treatment.

## 2016-02-29 NOTE — Progress Notes (Signed)
Left script for keytruda for dr. Benay Spice to sign and get bck to [facility]

## 2016-03-07 ENCOUNTER — Telehealth: Payer: Self-pay | Admitting: *Deleted

## 2016-03-07 ENCOUNTER — Ambulatory Visit: Payer: BLUE CROSS/BLUE SHIELD | Admitting: Oncology

## 2016-03-07 NOTE — Telephone Encounter (Signed)
Pt reports he received a "robo-call" about an appt for today. Informed him that was an old appt, it has been canceled. Confirmed 4/7 appts.

## 2016-03-12 ENCOUNTER — Other Ambulatory Visit: Payer: Self-pay | Admitting: Oncology

## 2016-03-13 ENCOUNTER — Encounter: Payer: Self-pay | Admitting: Oncology

## 2016-03-13 NOTE — Progress Notes (Signed)
I spoke with Joel Osborne at [facility] to advise of treatment Friday. Thy will ship 4/4 to be recd 4/5

## 2016-03-17 ENCOUNTER — Ambulatory Visit (HOSPITAL_BASED_OUTPATIENT_CLINIC_OR_DEPARTMENT_OTHER): Payer: BLUE CROSS/BLUE SHIELD

## 2016-03-17 ENCOUNTER — Telehealth: Payer: Self-pay | Admitting: *Deleted

## 2016-03-17 ENCOUNTER — Other Ambulatory Visit (HOSPITAL_BASED_OUTPATIENT_CLINIC_OR_DEPARTMENT_OTHER): Payer: BLUE CROSS/BLUE SHIELD

## 2016-03-17 ENCOUNTER — Ambulatory Visit (HOSPITAL_BASED_OUTPATIENT_CLINIC_OR_DEPARTMENT_OTHER): Payer: BLUE CROSS/BLUE SHIELD | Admitting: Nurse Practitioner

## 2016-03-17 ENCOUNTER — Telehealth: Payer: Self-pay | Admitting: Oncology

## 2016-03-17 ENCOUNTER — Encounter: Payer: Self-pay | Admitting: *Deleted

## 2016-03-17 VITALS — BP 126/71 | HR 92 | Temp 99.2°F | Resp 18 | Ht 72.0 in | Wt 196.0 lb

## 2016-03-17 DIAGNOSIS — C155 Malignant neoplasm of lower third of esophagus: Secondary | ICD-10-CM

## 2016-03-17 DIAGNOSIS — Z79899 Other long term (current) drug therapy: Secondary | ICD-10-CM

## 2016-03-17 DIAGNOSIS — R05 Cough: Secondary | ICD-10-CM | POA: Diagnosis not present

## 2016-03-17 DIAGNOSIS — Z5112 Encounter for antineoplastic immunotherapy: Secondary | ICD-10-CM

## 2016-03-17 LAB — CBC WITH DIFFERENTIAL/PLATELET
BASO%: 0.7 % (ref 0.0–2.0)
Basophils Absolute: 0.1 10*3/uL (ref 0.0–0.1)
EOS ABS: 0.1 10*3/uL (ref 0.0–0.5)
EOS%: 1 % (ref 0.0–7.0)
HCT: 42 % (ref 38.4–49.9)
HEMOGLOBIN: 13.7 g/dL (ref 13.0–17.1)
LYMPH#: 0.7 10*3/uL — AB (ref 0.9–3.3)
LYMPH%: 5.9 % — AB (ref 14.0–49.0)
MCH: 29.2 pg (ref 27.2–33.4)
MCHC: 32.5 g/dL (ref 32.0–36.0)
MCV: 89.8 fL (ref 79.3–98.0)
MONO#: 1 10*3/uL — AB (ref 0.1–0.9)
MONO%: 8.7 % (ref 0.0–14.0)
NEUT%: 83.7 % — ABNORMAL HIGH (ref 39.0–75.0)
NEUTROS ABS: 9.8 10*3/uL — AB (ref 1.5–6.5)
PLATELETS: 209 10*3/uL (ref 140–400)
RBC: 4.67 10*6/uL (ref 4.20–5.82)
RDW: 14.2 % (ref 11.0–14.6)
WBC: 11.7 10*3/uL — AB (ref 4.0–10.3)

## 2016-03-17 LAB — COMPREHENSIVE METABOLIC PANEL
ALBUMIN: 3.6 g/dL (ref 3.5–5.0)
ALK PHOS: 128 U/L (ref 40–150)
ANION GAP: 9 meq/L (ref 3–11)
AST: 12 U/L (ref 5–34)
BILIRUBIN TOTAL: 0.47 mg/dL (ref 0.20–1.20)
BUN: 10.2 mg/dL (ref 7.0–26.0)
CO2: 27 meq/L (ref 22–29)
CREATININE: 0.8 mg/dL (ref 0.7–1.3)
Calcium: 9.1 mg/dL (ref 8.4–10.4)
Chloride: 104 mEq/L (ref 98–109)
GLUCOSE: 85 mg/dL (ref 70–140)
Potassium: 4.2 mEq/L (ref 3.5–5.1)
Sodium: 140 mEq/L (ref 136–145)
TOTAL PROTEIN: 7.6 g/dL (ref 6.4–8.3)

## 2016-03-17 LAB — TSH: TSH: 1.442 m(IU)/L (ref 0.320–4.118)

## 2016-03-17 MED ORDER — SODIUM CHLORIDE 0.9 % IV SOLN
200.0000 mg | Freq: Once | INTRAVENOUS | Status: AC
  Administered 2016-03-17: 200 mg via INTRAVENOUS
  Filled 2016-03-17: qty 8

## 2016-03-17 MED ORDER — SODIUM CHLORIDE 0.9% FLUSH
10.0000 mL | INTRAVENOUS | Status: DC | PRN
  Administered 2016-03-17: 10 mL
  Filled 2016-03-17: qty 10

## 2016-03-17 MED ORDER — HEPARIN SOD (PORK) LOCK FLUSH 100 UNIT/ML IV SOLN
500.0000 [IU] | Freq: Once | INTRAVENOUS | Status: AC | PRN
  Administered 2016-03-17: 500 [IU]
  Filled 2016-03-17: qty 5

## 2016-03-17 MED ORDER — SODIUM CHLORIDE 0.9 % IV SOLN
Freq: Once | INTRAVENOUS | Status: AC
  Administered 2016-03-17: 12:00:00 via INTRAVENOUS

## 2016-03-17 NOTE — Telephone Encounter (Signed)
per pof to sch pt appt-sent [facility] email to sch trmt-pt opt get updated copy b4 leaving

## 2016-03-17 NOTE — Patient Instructions (Signed)
[facility] Discharge Instructions for Patients Receiving Chemotherapy  Today you received the following chemotherapy agents: Keytruda   To help prevent nausea and vomiting after your treatment, we encourage you to take your nausea medication as directed  If you develop nausea and vomiting that is not controlled by your nausea medication, call the clinic.   BELOW ARE SYMPTOMS THAT SHOULD BE REPORTED IMMEDIATELY:  *FEVER GREATER THAN 100.5 F  *CHILLS WITH OR WITHOUT FEVER  NAUSEA AND VOMITING THAT IS NOT CONTROLLED WITH YOUR NAUSEA MEDICATION  *UNUSUAL SHORTNESS OF BREATH  *UNUSUAL BRUISING OR BLEEDING  TENDERNESS IN MOUTH AND THROAT WITH OR WITHOUT PRESENCE OF ULCERS  *URINARY PROBLEMS  *BOWEL PROBLEMS  UNUSUAL RASH Items with * indicate a potential emergency and should be followed up as soon as possible.  Feel free to call the clinic you have any questions or concerns. The clinic phone number is [phone].  

## 2016-03-17 NOTE — Telephone Encounter (Signed)
Per staff message and POF I have scheduled appts. Advised scheduler of appts. JMW  

## 2016-03-17 NOTE — Progress Notes (Signed)
Oncology Nurse Navigator Documentation  Oncology Nurse Navigator Flowsheets 03/17/2016  Navigator Location [facility]-Med Onc  Navigator Encounter Type Treatment  Patient Visit Type MedOnc  Treatment Phase Active Tx--pembrolizumab #2  Barriers/Navigation Needs Education  Education  Symptom Management--cough  Interventions Education Method  Coordination of Care -  Education Method Verbal;Teach-back  Support Groups/Services -  Acuity Level 1  Time Spent with Patient 15  Instructed to monitor for any patterns to he cough-? Aspiration vs side effect of drug. NP instructed him to try OTC cough med. He denies any navigation needs.

## 2016-03-17 NOTE — Progress Notes (Signed)
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 He completed cycle 1 pembrolizumab on 02/25/2016. He denies nausea/vomiting. No mouth sores. No diarrhea. No rash. He has noted an increase in his cough over the past 2 weeks. At times the cough seems to occur after eating or drinking. He is waking up at night coughing, interfering with his sleep. He has some chest pain from the coughing. No shortness of breath or fever.  Objective:  Vital signs in last 24 hours:  Blood pressure 126/71, pulse 92, temperature 99.2 F (37.3 C), resp. rate 18, height 6' (1.829 m), weight 196 lb (88.905 kg), SpO2 98 %.    HEENT: No thrush or ulcers. Lymphatics: No palpable cervical or supra-clavicular lymph nodes. Resp: Lungs clear bilaterally. Cardio: Regular rate and rhythm. GI: Abdomen soft and nontender. No hepatomegaly. Vascular: No leg edema. Skin: No rash. Port-A-Cath without erythema.    Lab Results:  Lab Results  Component Value Date   WBC 11.7* 03/17/2016   HGB 13.7 03/17/2016   HCT 42.0 03/17/2016   MCV 89.8 03/17/2016   PLT 209 03/17/2016   NEUTROABS 9.8* 03/17/2016    Imaging:  No results found.  Medications: I have reviewed the patient's current medications.  Assessment/Plan: 1.Adenocarcinoma of the distal esophagus/gastric cardia-status post an endoscopic biopsy on 03/18/2013 confirming adenocarcinoma, HER-2/neu not amplified  -Staging CT scans 03/20/2013 confirmed a distal esophageal/gastric cardia mass, distal paraesophageal lymph nodes and paratracheal nodes concerning for metastatic lymphadenopathy  -A PET scan 03/27/2013 with a multifocal area of hypermetabolic activity involving the thoracic esophagus extending into the gastric cardia and hypermetabolic mediastinal nodes, no distant metastatic disease  -Initiation of radiation 04/07/2013 and weekly Taxol/carboplatin 04/08/2013,  radiation completed 05/15/2013  -Restaging PET scan 06/09/2013-no evidence of metastatic disease, no hypermetabolic lymphadenopathy  -Esophagogastrectomy 07/07/2013 revealed a ypT1b,ypN0 tumor with negative surgical margins  -CT neck 06/09/2014 revealed a necrotic lymph node in the right tracheoesophageal groove  -PET scan 11/15/2014 revealed a single focus of hypermetabolism corresponding to the necrotic right upper mediastinum lymph node -EUS biopsy of the paraesophageal lymph node 06/25/2012 confirmed metastatic adenocarcinoma  -Radiation completed 08/11/2014 -Rising CEA May 2016 -PET scan 07/13/2015 with hypermetabolic right paratracheal and right hilar adenopathy -Cycle 1 FOLFOX 08/03/2015 -Cycle 2 FOLFOX 08/17/2015 - cycle 3 FOLFOX 08/31/2015 (oxaliplatin dose reduced secondary to thrombocytopenia)  -Cycle 4 FOLFOX 09/14/2015 -Cycle 5 FOLFOX 09/28/2015 - restaging CTs 09/30/2015 with stable disease involving neck/chest lymph nodes, a slight increase in size of left upper lobe lung nodule  - cycle 6 FOLFOX 10/12/2015  -FOLFOX discontinued secondary to development of bilateral vocal cord paralysis November 2016 - CT scan of the neck and chest on 11/22/15 revealed no definite disease in the neck. At the thoracic inlet region, there is questionably slight increase in prominence of soft tissue at the right tracheoesophageal groove. This could be an enlarging node or mass but could also relate to the esophagectomy and pull-through surgery.Enlarging lingular and left lower lobe nodules are worrisome for metastatic disease. Peribronchovascular nodular consolidation in the anterior right lower lobe, slightly more prominent, indeterminate. Cholelithiasis. Left diaphragmatic hernia, stable. -Xeloda 1 week on/1 week off beginning 12/11/2015 -Restaging CTs of the neck and chest 02/07/2016 revealed enlargement of 2 left-sided lung nodules, no other evidence of disease progression -Cycle 1  Pembrolizumab 02/25/2016  -Cycle 2 Pembrolizumab 03/17/2016 2. Indeterminate 15 mm right liver lesion on the CT scan 03/20/2013, MRI   of the liver 06/09/2013 confirmed a right liver hemangioma  3. Anorexia/weight loss and solid dysphagia secondary to #1 -improved 4. History of a colon polyp, status post a polypectomy October 2013  5. history of Thrombocytopenia secondary chemotherapy-the carboplatin wase dose reduced with week 5 of chemotherapy chemotherapy  6. hoarseness secondary to right vocal cord paralysis-CT/PET scan imaging consistent with a malignant right paratracheal lymph node causing the vocal cord paralysis  -Status post a laryngeal plasty at [facility] on 09/02/2014 7. Esophageal stricture-status post dilation 06/08/2015 -Food impaction removal 09/29/2015 -Esophageal stricture dilation 10/06/2015 8. Port-A-Cath placement 07/20/2015 9. Delayed nausea following cycle 1 FOLFOX. Aloxi and Emend added with cycle 2. 10. Thrombocytopenia secondary to chemotherapy  11. Emotional lability/depression-prophylactic Decadron discontinued; Remeron initiated 12. 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 He has completed 1 cycle of Pembrolizumab. Plan to proceed with cycle 2 today as scheduled.  He has an increased cough, question etiology. He will try an over-the-counter cough medication. He understands to contact the office if the cough worsens or he develops other concerning symptoms such as shortness of breath, fever. We discussed the possibility of aspiration. He will monitor the cough and make note if it mainly occurs with oral intake.  He will return for a follow-up visit and cycle 3 pembrolizumab in 3 weeks. He will contact the office in the interim as outlined above or with any other problems.  Plan reviewed with  Dr. Benay Spice.    Joel, Osborne ANP/GNP-BC   03/17/2016  11:07 AM

## 2016-03-18 LAB — T4: T4 TOTAL: 8.9 ug/dL (ref 4.5–12.0)

## 2016-04-02 ENCOUNTER — Other Ambulatory Visit: Payer: Self-pay | Admitting: Oncology

## 2016-04-03 ENCOUNTER — Telehealth: Payer: Self-pay | Admitting: *Deleted

## 2016-04-03 NOTE — Telephone Encounter (Signed)
Patient called to confirm when his next appointments are.  Lab, F/U and treatement scheduled 04-07-2016 beginning at 0800.  Appointment information provided.

## 2016-04-07 ENCOUNTER — Other Ambulatory Visit (HOSPITAL_BASED_OUTPATIENT_CLINIC_OR_DEPARTMENT_OTHER): Payer: BLUE CROSS/BLUE SHIELD

## 2016-04-07 ENCOUNTER — Ambulatory Visit (HOSPITAL_BASED_OUTPATIENT_CLINIC_OR_DEPARTMENT_OTHER): Payer: BLUE CROSS/BLUE SHIELD | Admitting: Oncology

## 2016-04-07 ENCOUNTER — Telehealth: Payer: Self-pay | Admitting: *Deleted

## 2016-04-07 ENCOUNTER — Ambulatory Visit (HOSPITAL_BASED_OUTPATIENT_CLINIC_OR_DEPARTMENT_OTHER): Payer: BLUE CROSS/BLUE SHIELD

## 2016-04-07 VITALS — BP 114/69 | HR 94 | Temp 98.9°F | Resp 18 | Ht 72.0 in | Wt 186.0 lb

## 2016-04-07 DIAGNOSIS — C155 Malignant neoplasm of lower third of esophagus: Secondary | ICD-10-CM

## 2016-04-07 DIAGNOSIS — M2550 Pain in unspecified joint: Secondary | ICD-10-CM | POA: Diagnosis not present

## 2016-04-07 DIAGNOSIS — R634 Abnormal weight loss: Secondary | ICD-10-CM

## 2016-04-07 DIAGNOSIS — R630 Anorexia: Secondary | ICD-10-CM

## 2016-04-07 DIAGNOSIS — M791 Myalgia: Secondary | ICD-10-CM

## 2016-04-07 DIAGNOSIS — Z5112 Encounter for antineoplastic immunotherapy: Secondary | ICD-10-CM

## 2016-04-07 DIAGNOSIS — D696 Thrombocytopenia, unspecified: Secondary | ICD-10-CM

## 2016-04-07 LAB — CBC WITH DIFFERENTIAL/PLATELET
BASO%: 0.5 % (ref 0.0–2.0)
BASOS ABS: 0 10*3/uL (ref 0.0–0.1)
EOS%: 1.6 % (ref 0.0–7.0)
Eosinophils Absolute: 0.1 10*3/uL (ref 0.0–0.5)
HCT: 40.2 % (ref 38.4–49.9)
HEMOGLOBIN: 13.2 g/dL (ref 13.0–17.1)
LYMPH%: 9.6 % — AB (ref 14.0–49.0)
MCH: 29.1 pg (ref 27.2–33.4)
MCHC: 32.9 g/dL (ref 32.0–36.0)
MCV: 88.4 fL (ref 79.3–98.0)
MONO#: 0.5 10*3/uL (ref 0.1–0.9)
MONO%: 9.3 % (ref 0.0–14.0)
NEUT%: 79 % — ABNORMAL HIGH (ref 39.0–75.0)
NEUTROS ABS: 4.7 10*3/uL (ref 1.5–6.5)
Platelets: 200 10*3/uL (ref 140–400)
RBC: 4.54 10*6/uL (ref 4.20–5.82)
RDW: 14.1 % (ref 11.0–14.6)
WBC: 5.9 10*3/uL (ref 4.0–10.3)
lymph#: 0.6 10*3/uL — ABNORMAL LOW (ref 0.9–3.3)

## 2016-04-07 LAB — COMPREHENSIVE METABOLIC PANEL
ALBUMIN: 3.4 g/dL — AB (ref 3.5–5.0)
ALK PHOS: 153 U/L — AB (ref 40–150)
ALT: 11 U/L (ref 0–55)
AST: 10 U/L (ref 5–34)
Anion Gap: 8 mEq/L (ref 3–11)
BUN: 9.4 mg/dL (ref 7.0–26.0)
CO2: 26 meq/L (ref 22–29)
Calcium: 9.3 mg/dL (ref 8.4–10.4)
Chloride: 105 mEq/L (ref 98–109)
Creatinine: 0.8 mg/dL (ref 0.7–1.3)
GLUCOSE: 164 mg/dL — AB (ref 70–140)
POTASSIUM: 4.4 meq/L (ref 3.5–5.1)
SODIUM: 139 meq/L (ref 136–145)
Total Bilirubin: 0.6 mg/dL (ref 0.20–1.20)
Total Protein: 7.2 g/dL (ref 6.4–8.3)

## 2016-04-07 LAB — TSH: TSH: 1.052 m(IU)/L (ref 0.320–4.118)

## 2016-04-07 MED ORDER — HEPARIN SOD (PORK) LOCK FLUSH 100 UNIT/ML IV SOLN
500.0000 [IU] | Freq: Once | INTRAVENOUS | Status: AC | PRN
Start: 2016-04-07 — End: 2016-04-07
  Administered 2016-04-07: 500 [IU]
  Filled 2016-04-07: qty 5

## 2016-04-07 MED ORDER — SODIUM CHLORIDE 0.9 % IV SOLN
Freq: Once | INTRAVENOUS | Status: AC
  Administered 2016-04-07: 10:00:00 via INTRAVENOUS

## 2016-04-07 MED ORDER — SODIUM CHLORIDE 0.9 % IV SOLN
200.0000 mg | Freq: Once | INTRAVENOUS | Status: AC
  Administered 2016-04-07: 200 mg via INTRAVENOUS
  Filled 2016-04-07: qty 8

## 2016-04-07 MED ORDER — SODIUM CHLORIDE 0.9% FLUSH
10.0000 mL | INTRAVENOUS | Status: DC | PRN
Start: 2016-04-07 — End: 2016-04-07
  Administered 2016-04-07: 10 mL
  Filled 2016-04-07: qty 10

## 2016-04-07 NOTE — Telephone Encounter (Signed)
Per staff message and POF I have scheduled appts. Advised scheduler of appts. JMW  

## 2016-04-07 NOTE — Progress Notes (Signed)
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 He complains of arthralgias/myalgias and malaise this week. His appetite is poor. No dyspnea, rash, or diarrhea. He is working.   Objective:  Vital signs in last 24 hours:  Blood pressure 114/69, pulse 94, temperature 98.9 F (37.2 C), temperature source Oral, resp. rate 18, height 6' (1.829 m), weight 186 lb (84.369 kg), SpO2 98 %.    HEENT: No thrush Lymphatics: No cervical or supraclavicular nodes Resp: Lungs clear bilaterally Cardio: Regular rate and rhythm GI: No hepatomegaly, nontender Vascular: No leg edema  Skin: No rash   Portacath/PICC-without erythema  Lab Results:  Lab Results  Component Value Date   WBC 5.9 04/07/2016   HGB 13.2 04/07/2016   HCT 40.2 04/07/2016   MCV 88.4 04/07/2016   PLT 200 04/07/2016   NEUTROABS 4.7 04/07/2016   Creatinine 0.8, alk phosphatase 153, AST 10, ALT 11, bilirubin 0.6, albumin 3.4  Lab Results  Component Value Date   CEA1 470.6* 02/25/2016     Medications: I have reviewed the patient's current medications.  Assessment/Plan: 1.Adenocarcinoma of the distal esophagus/gastric cardia-status post an endoscopic biopsy on 03/18/2013 confirming adenocarcinoma, HER-2/neu not amplified  -Staging CT scans 03/20/2013 confirmed a distal esophageal/gastric cardia mass, distal paraesophageal lymph nodes and paratracheal nodes concerning for metastatic lymphadenopathy  -A PET scan 03/27/2013 with a multifocal area of hypermetabolic activity involving the thoracic esophagus extending into the gastric cardia and hypermetabolic mediastinal nodes, no distant metastatic disease  -Initiation of radiation 04/07/2013 and weekly Taxol/carboplatin 04/08/2013, radiation completed 05/15/2013  -Restaging PET scan 06/09/2013-no evidence of metastatic disease, no  hypermetabolic lymphadenopathy  -Esophagogastrectomy 07/07/2013 revealed a ypT1b,ypN0 tumor with negative surgical margins  -CT neck 06/09/2014 revealed a necrotic lymph node in the right tracheoesophageal groove  -PET scan 11/15/2014 revealed a single focus of hypermetabolism corresponding to the necrotic right upper mediastinum lymph node -EUS biopsy of the paraesophageal lymph node 06/25/2012 confirmed metastatic adenocarcinoma  -Radiation completed 08/11/2014 -Rising CEA May 2016 -PET scan 07/13/2015 with hypermetabolic right paratracheal and right hilar adenopathy -Cycle 1 FOLFOX 08/03/2015 -Cycle 2 FOLFOX 08/17/2015 - cycle 3 FOLFOX 08/31/2015 (oxaliplatin dose reduced secondary to thrombocytopenia)  -Cycle 4 FOLFOX 09/14/2015 -Cycle 5 FOLFOX 09/28/2015 - restaging CTs 09/30/2015 with stable disease involving neck/chest lymph nodes, a slight increase in size of left upper lobe lung nodule  - cycle 6 FOLFOX 10/12/2015  -FOLFOX discontinued secondary to development of bilateral vocal cord paralysis November 2016 - CT scan of the neck and chest on 11/22/15 revealed no definite disease in the neck. At the thoracic inlet region, there is questionably slight increase in prominence of soft tissue at the right tracheoesophageal groove. This could be an enlarging node or mass but could also relate to the esophagectomy and pull-through surgery.Enlarging lingular and left lower lobe nodules are worrisome for metastatic disease. Peribronchovascular nodular consolidation in the anterior right lower lobe, slightly more prominent, indeterminate. Cholelithiasis. Left diaphragmatic hernia, stable. -Xeloda 1 week on/1 week off beginning 12/11/2015 -Restaging CTs of the neck and chest 02/07/2016 revealed enlargement of 2 left-sided lung nodules, no other evidence of disease progression -Cycle 1 Pembrolizumab 02/25/2016  -Cycle 2  Pembrolizumab 03/17/2016 -Cycle 3  Pembrolizumab 04/07/2016  2.  Indeterminate 15 mm right liver lesion on the CT scan 03/20/2013, MRI of the liver 06/09/2013 confirmed a right liver hemangioma  3. Anorexia/weight loss and solid dysphagia secondary to #1 -  improved 4. History of a colon polyp, status post a polypectomy October 2013  5. history of Thrombocytopenia secondary chemotherapy-the carboplatin wase dose reduced with week 5 of chemotherapy chemotherapy  6. hoarseness secondary to right vocal cord paralysis-CT/PET scan imaging consistent with a malignant right paratracheal lymph node causing the vocal cord paralysis  -Status post a laryngeal plasty at [facility] on 09/02/2014 7. Esophageal stricture-status post dilation 06/08/2015 -Food impaction removal 09/29/2015 -Esophageal stricture dilation 10/06/2015 8. Port-A-Cath placement 07/20/2015 9. Delayed nausea following cycle 1 FOLFOX. Aloxi and Emend added with cycle 2. 10. Thrombocytopenia secondary to chemotherapy  11. Emotional lability/depression-prophylactic Decadron discontinued; Remeron initiated 12. 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 There is no evidence of acute toxicity. We will follow-up on the TSH from today.    I encouraged him to use nutrition supplements as he continues to lose weight. He will contact us for progressive arthralgias/myalgias.  He will return for an office visit and treatment in 3 weeks. We will check the CEA when he returns in 3 weeks.  Betsy Coder, MD  04/07/2016  8:43 AM

## 2016-04-07 NOTE — Patient Instructions (Signed)
[facility] Discharge Instructions for Patients Receiving Chemotherapy  Today you received the following chemotherapy agents:  Keytruda.  To help prevent nausea and vomiting after your treatment, we encourage you to take your nausea medication as prescribed.   If you develop nausea and vomiting that is not controlled by your nausea medication, call the clinic.   BELOW ARE SYMPTOMS THAT SHOULD BE REPORTED IMMEDIATELY:  *FEVER GREATER THAN 100.5 F  *CHILLS WITH OR WITHOUT FEVER  NAUSEA AND VOMITING THAT IS NOT CONTROLLED WITH YOUR NAUSEA MEDICATION  *UNUSUAL SHORTNESS OF BREATH  *UNUSUAL BRUISING OR BLEEDING  TENDERNESS IN MOUTH AND THROAT WITH OR WITHOUT PRESENCE OF ULCERS  *URINARY PROBLEMS  *BOWEL PROBLEMS  UNUSUAL RASH Items with * indicate a potential emergency and should be followed up as soon as possible.  Feel free to call the clinic you have any questions or concerns. The clinic phone number is [phone].  Please show the CHEMO ALERT CARD at check-in to the Emergency Department and triage nurse.   

## 2016-04-17 ENCOUNTER — Telehealth: Payer: Self-pay | Admitting: Internal Medicine

## 2016-04-17 NOTE — Telephone Encounter (Signed)
Pt having difficulty swallowing and thinks he needs another dilation. Pt scheduled to see Dr. Henrene Pastor 04/19/16'@10'$ :15am. Pt aware of appt.

## 2016-04-18 ENCOUNTER — Other Ambulatory Visit: Payer: Self-pay | Admitting: *Deleted

## 2016-04-18 ENCOUNTER — Ambulatory Visit (HOSPITAL_BASED_OUTPATIENT_CLINIC_OR_DEPARTMENT_OTHER): Payer: BLUE CROSS/BLUE SHIELD | Admitting: Nurse Practitioner

## 2016-04-18 ENCOUNTER — Telehealth: Payer: Self-pay | Admitting: Nurse Practitioner

## 2016-04-18 ENCOUNTER — Ambulatory Visit (HOSPITAL_COMMUNITY)
Admission: RE | Admit: 2016-04-18 | Discharge: 2016-04-18 | Disposition: A | Discharge status: Home / Self Care | Payer: BLUE CROSS/BLUE SHIELD | Source: Ambulatory Visit | Attending: Oncology | Admitting: Oncology

## 2016-04-18 VITALS — BP 117/88 | HR 129 | Temp 99.1°F | Resp 18 | Ht 72.0 in | Wt 185.5 lb

## 2016-04-18 DIAGNOSIS — R918 Other nonspecific abnormal finding of lung field: Secondary | ICD-10-CM | POA: Insufficient documentation

## 2016-04-18 DIAGNOSIS — R05 Cough: Secondary | ICD-10-CM | POA: Diagnosis not present

## 2016-04-18 DIAGNOSIS — C155 Malignant neoplasm of lower third of esophagus: Secondary | ICD-10-CM | POA: Diagnosis present

## 2016-04-18 DIAGNOSIS — I517 Cardiomegaly: Secondary | ICD-10-CM | POA: Diagnosis not present

## 2016-04-18 MED ORDER — LEVOFLOXACIN 25 MG/ML PO SOLN: 500.0000 mg | Freq: Every day | ORAL | Status: DC

## 2016-04-18 MED FILL — levoFLOXacin 25 MG/ML SOLN: 25 | 7 days supply | Qty: 140 | Fill #0

## 2016-04-18 NOTE — Progress Notes (Signed)
Vitals repeated, BP 117/88 siting.  resting HR is 129. While ambulating HR increased to 147. O2 sat dropped from 98% to 97% after ambulating. Marlynn Perking informed.

## 2016-04-18 NOTE — Progress Notes (Unsigned)
Called pt regarding after hr call from yesterday.  Pt states that he has had cough with clear sputum, chills over the weekend, chest soreness from coughing, decreased appetite, not being able to sleep x 2 days and no relief from Robitussin which he has taken twice a day for a week.  Dr. Benay Spice informed of pt.'s complaints and requests pt to come in today for visit with L. Tyan NP at 1:45PM.  Pt agreeable to time and will be here at 1:45PM.

## 2016-04-18 NOTE — Telephone Encounter (Signed)
spoke w/ pt confirmed 5/11 apt

## 2016-04-18 NOTE — Progress Notes (Addendum)
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 He completed cycle 3 Pembrolizumab 04/07/2016.  He reports a cough for the past 3 days. He is expectorating "white frothy phlegm". He has had some shaking chills. No documented fever. He notes mild dyspnea. The dyspnea worsens when he coughs. He reports "diffuse" chest pain with coughing.  Objective:  Vital signs in last 24 hours:  Blood pressure 127/96, pulse 136, temperature 99.1 F (37.3 C), temperature source Oral, resp. rate 18, height 6' (1.829 m), weight 185 lb 8 oz (84.142 kg), SpO2 98 %.    HEENT: No thrush or ulcers. Resp: Lungs with a few wheezes. No rales. No respiratory distress. Cardio: Heart is regular, tachycardic. Bilateral neck vein distention. GI: No hepatomegaly. Vascular: Trace lower leg edema bilaterally. Port-A-Cath without erythema.  Lab Results:  Lab Results  Component Value Date   WBC 5.9 04/07/2016   HGB 13.2 04/07/2016   HCT 40.2 04/07/2016   MCV 88.4 04/07/2016   PLT 200 04/07/2016   NEUTROABS 4.7 04/07/2016    Imaging:  Dg Chest 2 View  04/18/2016  CLINICAL DATA:  Esophageal cancer. Productive cough since Saturday. Shortness of breath. EXAM: CHEST  2 VIEW COMPARISON:  CT 02/07/2016 FINDINGS: Airspace opacity noted in both lower lobes concerning for pneumonia. Heart is mildly enlarged. Tracheostomy tube and right Port-A-Cath in place. No visible effusions. No acute bony abnormality. IMPRESSION: Bilateral lower lobe airspace opacities may reflect pneumonia. Mild cardiomegaly. Electronically Signed   By: Joel Osborne M.D.   On: 04/18/2016 13:07    Medications: I have reviewed the patient's current medications.  Assessment/Plan: 1.Adenocarcinoma of the distal esophagus/gastric cardia-status post an endoscopic biopsy on 03/18/2013 confirming adenocarcinoma, HER-2/neu not  amplified  -Staging CT scans 03/20/2013 confirmed a distal esophageal/gastric cardia mass, distal paraesophageal lymph nodes and paratracheal nodes concerning for metastatic lymphadenopathy  -A PET scan 03/27/2013 with a multifocal area of hypermetabolic activity involving the thoracic esophagus extending into the gastric cardia and hypermetabolic mediastinal nodes, no distant metastatic disease  -Initiation of radiation 04/07/2013 and weekly Taxol/carboplatin 04/08/2013, radiation completed 05/15/2013  -Restaging PET scan 06/09/2013-no evidence of metastatic disease, no hypermetabolic lymphadenopathy  -Esophagogastrectomy 07/07/2013 revealed a ypT1b,ypN0 tumor with negative surgical margins  -CT neck 06/09/2014 revealed a necrotic lymph node in the right tracheoesophageal groove  -PET scan 11/15/2014 revealed a single focus of hypermetabolism corresponding to the necrotic right upper mediastinum lymph node -EUS biopsy of the paraesophageal lymph node 06/25/2012 confirmed metastatic adenocarcinoma  -Radiation completed 08/11/2014 -Rising CEA May 2016 -PET scan 07/13/2015 with hypermetabolic right paratracheal and right hilar adenopathy -Cycle 1 FOLFOX 08/03/2015 -Cycle 2 FOLFOX 08/17/2015 - cycle 3 FOLFOX 08/31/2015 (oxaliplatin dose reduced secondary to thrombocytopenia)  -Cycle 4 FOLFOX 09/14/2015 -Cycle 5 FOLFOX 09/28/2015 - restaging CTs 09/30/2015 with stable disease involving neck/chest lymph nodes, a slight increase in size of left upper lobe lung nodule  - cycle 6 FOLFOX 10/12/2015  -FOLFOX discontinued secondary to development of bilateral vocal cord paralysis November 2016 - CT scan of the neck and chest on 11/22/15 revealed no definite disease in the neck. At the thoracic inlet region, there is questionably slight increase in prominence of soft tissue at the right tracheoesophageal groove. This could be an enlarging node or mass but could also relate to the esophagectomy  and pull-through surgery.Enlarging lingular and left lower lobe nodules are worrisome for metastatic  disease. Peribronchovascular nodular consolidation in the anterior right lower lobe, slightly more prominent, indeterminate. Cholelithiasis. Left diaphragmatic hernia, stable. -Xeloda 1 week on/1 week off beginning 12/11/2015 -Restaging CTs of the neck and chest 02/07/2016 revealed enlargement of 2 left-sided lung nodules, no other evidence of disease progression -Cycle 1 Pembrolizumab 02/25/2016  -Cycle 2 Pembrolizumab 03/17/2016 -Cycle 3 Pembrolizumab 04/07/2016  2. Indeterminate 15 mm right liver lesion on the CT scan 03/20/2013, MRI of the liver 06/09/2013 confirmed a right liver hemangioma  3. Anorexia/weight loss and solid dysphagia secondary to #1 -improved 4. History of a colon polyp, status post a polypectomy October 2013  5. history of Thrombocytopenia secondary chemotherapy-the carboplatin wase dose reduced with week 5 of chemotherapy chemotherapy  6. hoarseness secondary to right vocal cord paralysis-CT/PET scan imaging consistent with a malignant right paratracheal lymph node causing the vocal cord paralysis  -Status post a laryngeal plasty at [facility] on 09/02/2014 7. Esophageal stricture-status post dilation 06/08/2015 -Food impaction removal 09/29/2015 -Esophageal stricture dilation 10/06/2015 8. Port-A-Cath placement 07/20/2015 9. Delayed nausea following cycle 1 FOLFOX. Aloxi and Emend added with cycle 2. 10. Thrombocytopenia secondary to chemotherapy  11. Emotional lability/depression-prophylactic Decadron discontinued; Remeron initiated 12. Progressive hoarseness November 2016, diagnosed with bilateral vocal cord paralysis, status post a tracheostomy and bilateral injection laryngoplasty at [facility] on 10/27/2015 13. Three-day history of a cough. Chest x-ray 04/18/2016 with airspace opacity noted in both lower lobes. Heart mildly enlarged.   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 No documented fever but he has been having shaking chills. Chest x-ray concerning for pneumonia. He will complete a 7 day course of Levaquin. We also discussed the possibility of pneumonitis related to pembrolizumab. If symptoms do not improve over the next 48 hours we will obtain a chest CT. He will return for a follow-up visit on 04/20/2016. He understands to contact the office or seek emergency evaluation if symptoms are worsening.  Patient seen with Dr. Benay Spice. Chest x-ray image reviewed on the computer. 25 minutes were spent face-to-face at today's visit with the majority of that time involved in counseling/coordination of care.    Joel Osborne, Joel Osborne ANP/GNP-BC   04/18/2016  1:40 PM   this was a shared visit with Ned Card. 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  I reviewed the chest x-ray.The clinical presentation is most consistent with pneumonia, though the differential diagnosis includes pneumonitis and pulmonary edema. He will begin a course of antibiotics and return for a follow-up visit in 2 days. He will contact us for worsened symptoms in the interim.   Julieanne Manson, M.D.

## 2016-04-19 ENCOUNTER — Ambulatory Visit: Payer: BLUE CROSS/BLUE SHIELD | Admitting: Internal Medicine

## 2016-04-20 ENCOUNTER — Inpatient Hospital Stay (HOSPITAL_COMMUNITY): Payer: BLUE CROSS/BLUE SHIELD

## 2016-04-20 ENCOUNTER — Ambulatory Visit (HOSPITAL_BASED_OUTPATIENT_CLINIC_OR_DEPARTMENT_OTHER): Payer: BLUE CROSS/BLUE SHIELD | Admitting: Nurse Practitioner

## 2016-04-20 ENCOUNTER — Encounter: Payer: Self-pay | Admitting: *Deleted

## 2016-04-20 ENCOUNTER — Other Ambulatory Visit: Payer: Self-pay

## 2016-04-20 ENCOUNTER — Inpatient Hospital Stay (HOSPITAL_COMMUNITY)
Admission: AD | Admit: 2016-04-20 | Discharge: 2016-04-26 | DRG: 853 | Disposition: A | Discharge status: Home / Self Care | Payer: BLUE CROSS/BLUE SHIELD | Source: Ambulatory Visit | Attending: Internal Medicine | Admitting: Internal Medicine

## 2016-04-20 VITALS — BP 117/77 | HR 128 | Temp 99.5°F | Resp 19 | Ht 72.0 in | Wt 184.9 lb

## 2016-04-20 DIAGNOSIS — R599 Enlarged lymph nodes, unspecified: Secondary | ICD-10-CM | POA: Diagnosis not present

## 2016-04-20 DIAGNOSIS — M25569 Pain in unspecified knee: Secondary | ICD-10-CM | POA: Insufficient documentation

## 2016-04-20 DIAGNOSIS — J90 Pleural effusion, not elsewhere classified: Secondary | ICD-10-CM | POA: Diagnosis present

## 2016-04-20 DIAGNOSIS — R634 Abnormal weight loss: Secondary | ICD-10-CM

## 2016-04-20 DIAGNOSIS — A419 Sepsis, unspecified organism: Secondary | ICD-10-CM | POA: Diagnosis present

## 2016-04-20 DIAGNOSIS — Y95 Nosocomial condition: Secondary | ICD-10-CM | POA: Diagnosis present

## 2016-04-20 DIAGNOSIS — C771 Secondary and unspecified malignant neoplasm of intrathoracic lymph nodes: Secondary | ICD-10-CM | POA: Diagnosis present

## 2016-04-20 DIAGNOSIS — C7802 Secondary malignant neoplasm of left lung: Secondary | ICD-10-CM | POA: Diagnosis present

## 2016-04-20 DIAGNOSIS — R1010 Upper abdominal pain, unspecified: Secondary | ICD-10-CM

## 2016-04-20 DIAGNOSIS — D6481 Anemia due to antineoplastic chemotherapy: Secondary | ICD-10-CM | POA: Diagnosis present

## 2016-04-20 DIAGNOSIS — R05 Cough: Secondary | ICD-10-CM | POA: Diagnosis not present

## 2016-04-20 DIAGNOSIS — Z803 Family history of malignant neoplasm of breast: Secondary | ICD-10-CM

## 2016-04-20 DIAGNOSIS — I4891 Unspecified atrial fibrillation: Secondary | ICD-10-CM | POA: Diagnosis present

## 2016-04-20 DIAGNOSIS — Z930 Tracheostomy status: Secondary | ICD-10-CM | POA: Diagnosis not present

## 2016-04-20 DIAGNOSIS — I319 Disease of pericardium, unspecified: Secondary | ICD-10-CM | POA: Diagnosis not present

## 2016-04-20 DIAGNOSIS — Z6826 Body mass index (BMI) 26.0-26.9, adult: Secondary | ICD-10-CM | POA: Diagnosis not present

## 2016-04-20 DIAGNOSIS — R0600 Dyspnea, unspecified: Secondary | ICD-10-CM | POA: Diagnosis not present

## 2016-04-20 DIAGNOSIS — R110 Nausea: Secondary | ICD-10-CM | POA: Diagnosis not present

## 2016-04-20 DIAGNOSIS — C155 Malignant neoplasm of lower third of esophagus: Secondary | ICD-10-CM

## 2016-04-20 DIAGNOSIS — J189 Pneumonia, unspecified organism: Secondary | ICD-10-CM | POA: Diagnosis present

## 2016-04-20 DIAGNOSIS — M11262 Other chondrocalcinosis, left knee: Secondary | ICD-10-CM | POA: Diagnosis present

## 2016-04-20 DIAGNOSIS — Z800 Family history of malignant neoplasm of digestive organs: Secondary | ICD-10-CM

## 2016-04-20 DIAGNOSIS — M79605 Pain in left leg: Secondary | ICD-10-CM | POA: Diagnosis not present

## 2016-04-20 DIAGNOSIS — J3802 Paralysis of vocal cords and larynx, bilateral: Secondary | ICD-10-CM | POA: Diagnosis present

## 2016-04-20 DIAGNOSIS — I314 Cardiac tamponade: Secondary | ICD-10-CM | POA: Diagnosis present

## 2016-04-20 DIAGNOSIS — T451X5D Adverse effect of antineoplastic and immunosuppressive drugs, subsequent encounter: Secondary | ICD-10-CM

## 2016-04-20 DIAGNOSIS — Z7189 Other specified counseling: Secondary | ICD-10-CM

## 2016-04-20 DIAGNOSIS — R7303 Prediabetes: Secondary | ICD-10-CM | POA: Diagnosis present

## 2016-04-20 DIAGNOSIS — Z8249 Family history of ischemic heart disease and other diseases of the circulatory system: Secondary | ICD-10-CM

## 2016-04-20 DIAGNOSIS — Z923 Personal history of irradiation: Secondary | ICD-10-CM | POA: Diagnosis not present

## 2016-04-20 DIAGNOSIS — J811 Chronic pulmonary edema: Secondary | ICD-10-CM | POA: Diagnosis present

## 2016-04-20 DIAGNOSIS — D72829 Elevated white blood cell count, unspecified: Secondary | ICD-10-CM | POA: Diagnosis present

## 2016-04-20 DIAGNOSIS — D638 Anemia in other chronic diseases classified elsewhere: Secondary | ICD-10-CM | POA: Diagnosis not present

## 2016-04-20 DIAGNOSIS — R52 Pain, unspecified: Secondary | ICD-10-CM

## 2016-04-20 DIAGNOSIS — E440 Moderate protein-calorie malnutrition: Secondary | ICD-10-CM | POA: Diagnosis present

## 2016-04-20 DIAGNOSIS — I480 Paroxysmal atrial fibrillation: Secondary | ICD-10-CM | POA: Diagnosis present

## 2016-04-20 DIAGNOSIS — J9601 Acute respiratory failure with hypoxia: Secondary | ICD-10-CM | POA: Diagnosis present

## 2016-04-20 DIAGNOSIS — I313 Pericardial effusion (noninflammatory): Secondary | ICD-10-CM | POA: Diagnosis present

## 2016-04-20 DIAGNOSIS — R590 Localized enlarged lymph nodes: Secondary | ICD-10-CM | POA: Diagnosis present

## 2016-04-20 DIAGNOSIS — I890 Lymphedema, not elsewhere classified: Secondary | ICD-10-CM

## 2016-04-20 DIAGNOSIS — I3139 Other pericardial effusion (noninflammatory): Secondary | ICD-10-CM | POA: Diagnosis present

## 2016-04-20 DIAGNOSIS — R748 Abnormal levels of other serum enzymes: Secondary | ICD-10-CM | POA: Diagnosis not present

## 2016-04-20 DIAGNOSIS — Z4682 Encounter for fitting and adjustment of non-vascular catheter: Secondary | ICD-10-CM

## 2016-04-20 DIAGNOSIS — C7800 Secondary malignant neoplasm of unspecified lung: Secondary | ICD-10-CM | POA: Diagnosis not present

## 2016-04-20 DIAGNOSIS — R630 Anorexia: Secondary | ICD-10-CM

## 2016-04-20 DIAGNOSIS — D696 Thrombocytopenia, unspecified: Secondary | ICD-10-CM

## 2016-04-20 DIAGNOSIS — M79602 Pain in left arm: Secondary | ICD-10-CM | POA: Diagnosis not present

## 2016-04-20 LAB — COMPREHENSIVE METABOLIC PANEL
ALT: 918 U/L — AB (ref 17–63)
ANION GAP: 15 (ref 5–15)
AST: 898 U/L — ABNORMAL HIGH (ref 15–41)
Albumin: 3.5 g/dL (ref 3.5–5.0)
Alkaline Phosphatase: 145 U/L — ABNORMAL HIGH (ref 38–126)
BUN: 31 mg/dL — ABNORMAL HIGH (ref 6–20)
CHLORIDE: 99 mmol/L — AB (ref 101–111)
CO2: 23 mmol/L (ref 22–32)
CREATININE: 1.25 mg/dL — AB (ref 0.61–1.24)
Calcium: 8.9 mg/dL (ref 8.9–10.3)
Glucose, Bld: 184 mg/dL — ABNORMAL HIGH (ref 65–99)
POTASSIUM: 5 mmol/L (ref 3.5–5.1)
SODIUM: 137 mmol/L (ref 135–145)
Total Bilirubin: 2.1 mg/dL — ABNORMAL HIGH (ref 0.3–1.2)
Total Protein: 7.1 g/dL (ref 6.5–8.1)

## 2016-04-20 LAB — URINALYSIS, ROUTINE W REFLEX MICROSCOPIC
Glucose, UA: NEGATIVE mg/dL
Hgb urine dipstick: NEGATIVE
Ketones, ur: NEGATIVE mg/dL
Leukocytes, UA: NEGATIVE
NITRITE: NEGATIVE
PROTEIN: NEGATIVE mg/dL
Specific Gravity, Urine: 1.042 — ABNORMAL HIGH (ref 1.005–1.030)
pH: 5.5 (ref 5.0–8.0)

## 2016-04-20 LAB — EXPECTORATED SPUTUM ASSESSMENT W REFEX TO RESP CULTURE

## 2016-04-20 LAB — EXPECTORATED SPUTUM ASSESSMENT W GRAM STAIN, RFLX TO RESP C

## 2016-04-20 LAB — CBC
HCT: 38.2 % — ABNORMAL LOW (ref 39.0–52.0)
Hemoglobin: 12.7 g/dL — ABNORMAL LOW (ref 13.0–17.0)
MCH: 28.5 pg (ref 26.0–34.0)
MCHC: 33.2 g/dL (ref 30.0–36.0)
MCV: 85.8 fL (ref 78.0–100.0)
PLATELETS: 297 10*3/uL (ref 150–400)
RBC: 4.45 MIL/uL (ref 4.22–5.81)
RDW: 13.8 % (ref 11.5–15.5)
WBC: 13.5 10*3/uL — ABNORMAL HIGH (ref 4.0–10.5)

## 2016-04-20 LAB — ECHOCARDIOGRAM COMPLETE
HEIGHTINCHES: 72 in
WEIGHTICAEL: 2958.4 [oz_av]

## 2016-04-20 LAB — APTT: APTT: 31 s (ref 24–37)

## 2016-04-20 LAB — DIFFERENTIAL
BASOS PCT: 0 %
Basophils Absolute: 0 10*3/uL (ref 0.0–0.1)
EOS ABS: 0 10*3/uL (ref 0.0–0.7)
EOS PCT: 0 %
LYMPHS PCT: 8 %
Lymphs Abs: 1 10*3/uL (ref 0.7–4.0)
MONO ABS: 1.5 10*3/uL — AB (ref 0.1–1.0)
Monocytes Relative: 11 %
Neutro Abs: 11.2 10*3/uL — ABNORMAL HIGH (ref 1.7–7.7)
Neutrophils Relative %: 81 %

## 2016-04-20 LAB — LACTIC ACID, PLASMA
LACTIC ACID, VENOUS: 3.7 mmol/L — AB (ref 0.5–2.0)
LACTIC ACID, VENOUS: 3.2 mmol/L — AB (ref 0.5–2.0)
LACTIC ACID, VENOUS: 3.8 mmol/L — AB (ref 0.5–2.0)

## 2016-04-20 LAB — PROTIME-INR
INR: 2.05 — AB (ref 0.00–1.49)
Prothrombin Time: 23 seconds — ABNORMAL HIGH (ref 11.6–15.2)

## 2016-04-20 LAB — PROCALCITONIN

## 2016-04-20 MED ORDER — BENZONATATE 100 MG PO CAPS
100.0000 mg | ORAL_CAPSULE | Freq: Three times a day (TID) | ORAL | Status: DC | PRN
  Administered 2016-04-25 – 2016-04-26 (×2): 100 mg via ORAL
  Filled 2016-04-20 (×2): qty 1

## 2016-04-20 MED ORDER — SODIUM CHLORIDE 0.9 % IV BOLUS (SEPSIS)
1000.0000 mL | Freq: Once | INTRAVENOUS | Status: AC
  Administered 2016-04-20: 1000 mL via INTRAVENOUS

## 2016-04-20 MED ORDER — MIRTAZAPINE 15 MG PO TABS
15.0000 mg | ORAL_TABLET | Freq: Every day | ORAL | Status: DC
  Administered 2016-04-21 – 2016-04-25 (×5): 15 mg via ORAL
  Filled 2016-04-20 (×5): qty 1

## 2016-04-20 MED ORDER — HYDROMORPHONE HCL 1 MG/ML IJ SOLN
1.0000 mg | INTRAMUSCULAR | Status: DC | PRN
  Administered 2016-04-20 – 2016-04-25 (×10): 1 mg via INTRAVENOUS
  Filled 2016-04-20 (×11): qty 1

## 2016-04-20 MED ORDER — CHLORHEXIDINE GLUCONATE 0.12 % MT SOLN
15.0000 mL | Freq: Two times a day (BID) | OROMUCOSAL | Status: DC
  Administered 2016-04-21: 15 mL via OROMUCOSAL

## 2016-04-20 MED ORDER — LORAZEPAM 2 MG/ML IJ SOLN: 0.5000 mg | INTRAMUSCULAR | Status: DC | PRN

## 2016-04-20 MED ORDER — ACETAMINOPHEN 160 MG/5ML PO SOLN
650.0000 mg | Freq: Four times a day (QID) | ORAL | Status: DC | PRN
  Administered 2016-04-20: 650 mg via ORAL
  Filled 2016-04-20: qty 20.3

## 2016-04-20 MED ORDER — SODIUM CHLORIDE 0.9 % IV SOLN
INTRAVENOUS | Status: DC
  Administered 2016-04-20 – 2016-04-21 (×3): via INTRAVENOUS

## 2016-04-20 MED ORDER — DEXTROSE 5 % IV SOLN
1.0000 g | Freq: Three times a day (TID) | INTRAVENOUS | Status: DC
  Administered 2016-04-20 – 2016-04-25 (×13): 1 g via INTRAVENOUS
  Filled 2016-04-20 (×16): qty 1

## 2016-04-20 MED ORDER — LIDOCAINE-PRILOCAINE 2.5-2.5 % EX CREA
TOPICAL_CREAM | Freq: Once | CUTANEOUS | Status: AC
  Administered 2016-04-20: 18:00:00 via TOPICAL

## 2016-04-20 MED ORDER — OXYCODONE HCL 5 MG PO TABS
5.0000 mg | ORAL_TABLET | Freq: Four times a day (QID) | ORAL | Status: DC | PRN
  Administered 2016-04-24: 5 mg via ORAL
  Filled 2016-04-20 (×2): qty 1

## 2016-04-20 MED ORDER — VANCOMYCIN HCL IN DEXTROSE 1-5 GM/200ML-% IV SOLN
1000.0000 mg | Freq: Two times a day (BID) | INTRAVENOUS | Status: DC
  Administered 2016-04-21 – 2016-04-22 (×4): 1000 mg via INTRAVENOUS
  Filled 2016-04-20 (×7): qty 200

## 2016-04-20 MED ORDER — CETYLPYRIDINIUM CHLORIDE 0.05 % MT LIQD: 7.0000 mL | Freq: Two times a day (BID) | OROMUCOSAL | Status: DC

## 2016-04-20 MED ORDER — IOPAMIDOL (ISOVUE-300) INJECTION 61%
75.0000 mL | Freq: Once | INTRAVENOUS | Status: AC | PRN
  Administered 2016-04-20: 75 mL via INTRAVENOUS

## 2016-04-20 MED ORDER — ZOLPIDEM TARTRATE 5 MG PO TABS
5.0000 mg | ORAL_TABLET | Freq: Every evening | ORAL | Status: DC | PRN
  Administered 2016-04-21 – 2016-04-25 (×5): 5 mg via ORAL
  Filled 2016-04-20 (×5): qty 1

## 2016-04-20 MED ORDER — SODIUM CHLORIDE 0.9 % IV BOLUS (SEPSIS)
1000.0000 mL | Freq: Once | INTRAVENOUS | Status: AC
Start: 2016-04-20 — End: 2016-04-20
  Administered 2016-04-20: 1000 mL via INTRAVENOUS

## 2016-04-20 MED ORDER — SODIUM CHLORIDE 0.9 % IV BOLUS (SEPSIS): 1000.0000 mL | Freq: Once | INTRAVENOUS | Status: AC

## 2016-04-20 MED ORDER — SODIUM CHLORIDE 0.9% FLUSH
10.0000 mL | Freq: Two times a day (BID) | INTRAVENOUS | Status: DC
  Administered 2016-04-21 – 2016-04-24 (×4): 10 mL

## 2016-04-20 MED ORDER — ENOXAPARIN SODIUM 40 MG/0.4ML ~~LOC~~ SOLN
40.0000 mg | SUBCUTANEOUS | Status: DC
  Administered 2016-04-20 – 2016-04-25 (×6): 40 mg via SUBCUTANEOUS
  Filled 2016-04-20 (×6): qty 0.4

## 2016-04-20 MED ORDER — SODIUM CHLORIDE 0.9 % IV SOLN
1250.0000 mg | Freq: Once | INTRAVENOUS | Status: AC
  Administered 2016-04-20: 1250 mg via INTRAVENOUS
  Filled 2016-04-20: qty 1250

## 2016-04-20 MED ORDER — SODIUM CHLORIDE 0.9 % IV BOLUS (SEPSIS): 1000.0000 mL | Freq: Once | INTRAVENOUS | Status: DC

## 2016-04-20 MED ORDER — SODIUM CHLORIDE 0.9% FLUSH: 10.0000 mL | INTRAVENOUS | Status: DC | PRN

## 2016-04-20 MED ORDER — HYDROCODONE-HOMATROPINE 5-1.5 MG/5ML PO SYRP
5.0000 mL | ORAL_SOLUTION | ORAL | Status: DC | PRN
  Administered 2016-04-22 – 2016-04-25 (×4): 5 mL via ORAL
  Filled 2016-04-20 (×4): qty 5

## 2016-04-20 NOTE — Progress Notes (Signed)
Follow-up note  Labs reviewed, noted elevated transaminases with AST of 898 and ALT of 918. Total bilirubin also elevated at 2.1. Alkaline phosphatase 145. His white count was 13.5. Will request that radiology include abdomen and pelvis in CT scan to assess for the possibility of metastatic involvement of liver.

## 2016-04-20 NOTE — H&P (Signed)
History and Physical    Joel Osborne PZW:[id number] DOB: 02-18-1966 DOA: (Not on file)  Referring MD/NP/PA:  PCP: [name], MD  Outpatient Specialists: Dr Benay Spice; Medical Oncology Patient coming from: [place]  Chief Complaint: Cough  HPI: Joel Osborne is a 50 y.o. 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 He presented to the clinic today for follow-up visit. Over the past week he has had progressively worsening cough associated with clear/yellow sputum production, subjective fevers, chills, generalized weakness, malaise, fatigue, poor tolerance to physical exertion. He had a chest x-ray performed on 04/18/2016 that showed bilateral lower lobe airspace opacities consistent with pneumonia. On this date he was started on Levaquin by his primary oncologist. Despite taking Levaquin in the outpatient setting he presented today reporting feeling worse. His primary oncologist Dr.Sherrill requests he be admitted to the inpatient service to receive IV antibiotic therapy and further workup.  Review of Systems: As per HPI otherwise 10 point review of systems negative.   Past Medical History  Diagnosis Date  . Hyperlipemia   . Colon polyp   . Pre-diabetes   . GERD (gastroesophageal reflux disease)   . History of radiation therapy 04/07/13-05-15-13    Esopageal ca,50.4Gy/9f and [place] [facility]  . Sleep apnea     no c-pap at this time  . Anxiety   . Diabetes mellitus without complication ([facility]     patient denies.  . Esophageal cancer ([facility] 03/18/2013    Adenocarcinoma  .  Diverticulosis     Past Surgical History  Procedure Laterality Date  . Appendectomy    . Lipoma excision    . Edg  03/18/2013    Esophageal Mass  . Complete esophagectomy N/A 07/07/2013    Procedure: ESOPHAGECTOMY COMPLETE;  Surgeon: EGrace Isaac MD;  [facility]  Service: Thoracic;  Laterality: N/A;  transhiatel esophagectomy, feeding jejunostomy  . Video bronchoscopy N/A 07/07/2013    Procedure: VIDEO BRONCHOSCOPY;  Surgeon: EGrace Isaac MD;  [facility]  Service: Thoracic;  Laterality: N/A;  . Eus N/A 06/25/2014    Procedure: UPPER ENDOSCOPIC ULTRASOUND (EUS) LINEAR;  Surgeon: DMilus Banister MD;  [facility];  Service: Endoscopy;  Laterality: N/A;  . Esophagogastroduodenoscopy N/A 07/02/2014    Procedure: ESOPHAGOGASTRODUODENOSCOPY (EGD);  Surgeon: CGatha Mayer MD;  [facility];  Service: Endoscopy;  Laterality: N/A;  . Vocal chord surgery      pushed right vocal cord more centered- September 2015  . Esophagogastroduodenoscopy N/A 02/16/2015    Procedure: ESOPHAGOGASTRODUODENOSCOPY (EGD);  Surgeon: DLafayette Dragon MD;  [facility];  Service: Endoscopy;  Laterality: N/A;  . Upper gastrointestinal endoscopy    . Colonoscopy    . Esophagogastroduodenoscopy N/A 09/29/2015    Procedure: ESOPHAGOGASTRODUODENOSCOPY (EGD);  Surgeon: MLadene Artist MD;  [facility];  Service: Endoscopy;  Laterality: N/A;     reports that he has never smoked. He has never used smokeless tobacco. He reports that he drinks about 1.2 oz of alcohol per week. He reports that he does not use illicit drugs.  Allergies  Allergen Reactions  . Lactose Intolerance (Gi) Other (See Comments)  Upset stomach     Family History  Problem Relation Age of Onset  . Colon cancer Mother     dx in her 19s  . Diabetes Mother   . Breast cancer Maternal Aunt 81  . Diabetes Maternal Aunt   . Esophageal cancer Maternal Aunt 78  . Kidney failure Brother 75  .  Stomach cancer Neg Hx   . Rectal cancer Neg Hx   . Hypertension Brother 35    Prior to Admission medications   Medication Sig Start Date End Date Taking? Authorizing Provider  acetaminophen (TYLENOL) 500 MG tablet Take 1,000 mg by mouth every 6 (six) hours as needed for mild pain or moderate pain.    Historical Provider, MD  anti-nausea (EMETROL) solution Take 10 mLs by mouth every 15 (fifteen) minutes as needed for nausea or vomiting.    Historical Provider, MD  brompheniramine-pseudoephedrine (DIMETAPP) 1-15 MG/5ML ELIX Take by mouth 2 (two) times daily as needed for allergies.    Historical Provider, MD  dextromethorphan-guaiFENesin (MUCINEX DM) 30-600 MG 12hr tablet Take 1 tablet by mouth 2 (two) times daily. Reported on 04/20/2016    Historical Provider, MD  eszopiclone (LUNESTA) 1 MG TABS tablet TAKE 1-2 TABLETS BY MOUTH AT BEDTIME AS NEEDED FOR SLEEP 02/22/16   Ladell Pier, MD  guaiFENesin (ROBITUSSIN) 100 MG/5ML liquid Take 200 mg by mouth 3 (three) times daily as needed for cough.    Historical Provider, MD  ibuprofen (ADVIL,MOTRIN) 200 MG tablet Take 400 mg by mouth every 6 (six) hours as needed for moderate pain (soreness).    Historical Provider, MD  lactase (LACTAID) 3000 UNITS tablet Take 3,000 Units by mouth daily as needed (lactose intolerance). For lactose intolerance    Historical Provider, MD  levofloxacin (LEVAQUIN) 25 MG/ML solution Take 20 mLs (500 mg total) by mouth daily. 04/18/16   Owens Shark, NP  lidocaine-prilocaine (EMLA) cream Apply 1 application topically as needed. Apply tablespoon to PAC 2 hours prior to stick and cover with plastic wrap 11/09/15   Ladell Pier, MD  mirtazapine (REMERON) 15 MG tablet TAKE ONE TABLET BY MOUTH AT BEDTIME 02/16/16   Ladell Pier, MD  Multiple Vitamin (MULTIVITAMIN WITH MINERALS) TABS tablet Take 1 tablet by mouth daily.    Historical Provider, MD  OVER THE COUNTER MEDICATION Take 28 mg by mouth 2 (two) times daily. Hemp Extract     Historical Provider, MD    Physical Exam: There were no vitals filed for this visit.    Constitutional: Ill-appearing, although no acute distress. Breathing comfortably on room air There were no vitals filed for this visit. Eyes: PERRL, lids and conjunctivae normal ENMT: Mucous membranes are moist. Posterior pharynx clear of any exudate or lesions.Normal dentition.  Neck: normal, supple, no masses, no thyromegaly. Status post tracheostomy placement Respiratory: He has bibasilar crackles, rhonchi, no wheezing or rales. Cardiovascular: Tachycardic, Regular rate and rhythm, no murmurs / rubs / gallops. 2+ pedal pulses. No carotid bruits.  Abdomen:  no masses palpated. No hepatosplenomegaly. Bowel sounds positive. Has mild upper abdominal pain to palpation Musculoskeletal: no clubbing / cyanosis. No joint deformity upper and lower extremities. Good ROM, no contractures. Normal muscle tone.  Skin: no rashes, lesions, ulcers. No induration Neurologic: CN 2-12 grossly intact. Sensation intact, DTR normal. Strength 5/5 in all 4.  Psychiatric: Normal judgment and insight. Alert and oriented x 3. Normal mood.   Labs on Admission: I have personally reviewed following labs and imaging studies  CBC:  No results for input(s): WBC, NEUTROABS, HGB, HCT, MCV, PLT in the last 168 hours. Basic Metabolic Panel: No results for input(s): NA, K, CL, CO2, GLUCOSE, BUN, CREATININE, CALCIUM, MG, PHOS in the last 168 hours. GFR: Estimated Creatinine Clearance: 119.9 mL/min (by C-G formula based on Cr of 0.8). Liver Function Tests: No results for input(s): AST, ALT, ALKPHOS, BILITOT, PROT, ALBUMIN in the last 168 hours. No results for input(s): LIPASE, AMYLASE in the last 168 hours. No results for input(s): AMMONIA in the last 168 hours. Coagulation Profile: No results for input(s): INR, PROTIME in the last 168 hours. Cardiac Enzymes: No results for input(s): CKTOTAL, CKMB, CKMBINDEX, TROPONINI in the last  168 hours. BNP (last 3 results) No results for input(s): PROBNP in the last 8760 hours. HbA1C: No results for input(s): HGBA1C in the last 72 hours. CBG: No results for input(s): GLUCAP in the last 168 hours. Lipid Profile: No results for input(s): CHOL, HDL, LDLCALC, TRIG, CHOLHDL, LDLDIRECT in the last 72 hours. Thyroid Function Tests: No results for input(s): TSH, T4TOTAL, FREET4, T3FREE, THYROIDAB in the last 72 hours. Anemia Panel: No results for input(s): VITAMINB12, FOLATE, FERRITIN, TIBC, IRON, RETICCTPCT in the last 72 hours. Urine analysis:    Component Value Date/Time   COLORURINE YELLOW 07/03/2013 1000   APPEARANCEUR CLEAR 07/03/2013 1000   LABSPEC 1.038* 07/03/2013 1000   PHURINE 6.0 07/03/2013 1000   GLUCOSEU >1000* 07/03/2013 1000   HGBUR NEGATIVE 07/03/2013 1000   BILIRUBINUR NEGATIVE 07/03/2013 1000   KETONESUR NEGATIVE 07/03/2013 1000   PROTEINUR NEGATIVE 07/03/2013 1000   UROBILINOGEN 0.2 07/03/2013 1000   NITRITE NEGATIVE 07/03/2013 1000   LEUKOCYTESUR NEGATIVE 07/03/2013 1000   Sepsis Labs: '@LABRCNTIP'$ (procalcitonin:4,lacticidven:4) )No results found for this or any previous visit (from the past 240 hour(s)).   Radiological Exams on Admission: No results found.  EKG: Independently reviewed.   Assessment/Plan Principal Problem:   HCAP (healthcare-associated pneumonia) Active Problems:   Cancer of distal third of esophagus ([place])  1.  Suspected healthcare associated pneumonia. Mr Tapp having a history of esophageal cancer, currently receiving his care at the cancer center, developing a clinical signs symptoms consistent with pneumonia over the past week. 2 days ago he had a chest x-ray that showed bilateral lower lobe infiltrates. He was started on Levaquin by his oncologist. Despite this intervention he continued to worsen. Will admit Mr.Kosinski to telemetry, initiate broad-spectrum IV antibiotic therapy with vancomycin and cefepime for possible  healthcare associated pneumonia. Also given lack of improvement to Levaquin in the outpatient setting, will further workup with a CT scan of lungs with IV contrast to assess for possible progression of malignancy involving lung or even perhaps abscess. He will be placed on pneumonia protocol, obtain blood cultures from 2 different sites, provide IV fluid resuscitation, placed on continuous cardiac monitoring.  2.  History of esophageal cancer. Patient having a history of adenocarcinoma of distal esophagus/gastric cardia has been treated with chemoradiation therapy. Clinical course complicated by vocal cord paralysis lead to tracheostomy placement. Restaging CT scans of neck and chest performed on 02/07/2016 revealed enlargement of 2 left-sided lung nodules without other evidence of disease progression. Recently he has been treated with Pembrolizumab at the cancer center. Plan to obtain a CT scan of lungs today to assess for possible disease progression.   DVT prophylaxis: Lovenox Code Status: Full code Family Communication: Family not present Disposition Plan: Anticipate he will require greater than 2 nights hospitalization Consults called: Spoke with his primary oncologist Dr. Benay Spice  Admission status: Plan to admit to telemetry   Kelvin Cellar MD [facility] Pager [phone]  If 7PM-7AM, please contact night-coverage [email or web address] Password [facility]  04/20/2016, 3:46 PM

## 2016-04-20 NOTE — Progress Notes (Addendum)
Received page from Dr. Learta Codding at 7:43pm regarding Joel Osborne echocardiogram appeared to show a large pericardial effusion with significant risk of tamponade. He recommended consulting cardiothoracic surgery and need of transferred to [facility]. Paged Dr. Roxan Hockey at multiple times at the number provided on amion with no reply. Subsequently,  was given information to suggest calling [place] at [facility]  where I was given home phone number [phone] and was able to successfully reach Dr. Roxan Hockey at approximately 10pm. He recommended immediately transferring him to [facility] and to page him upon arrival of the patient. Updated family on need of transfer and they are okay with this.

## 2016-04-20 NOTE — Progress Notes (Signed)
Brought pt to rm 1329 via wheelchair.

## 2016-04-20 NOTE — Progress Notes (Signed)
Received pt approximately 1800.  BP 118/90 mmHg  Pulse 120  Temp(Src) 101.2 F (38.4 C) (Oral)  Resp 20  Ht 6' (1.829 m)  Wt 83.87 kg (184 lb 14.4 oz)  BMI 25.07 kg/m2  SpO2 99% room air.  Started sepsis protocol of IV abx, bolus IVF.  Report from [place], [place].

## 2016-04-20 NOTE — Progress Notes (Signed)
Notified by echo tech to read echo ordered by Dr. Melvenia Beam for large pericardial effusion noted on Chest CT done for PNA and known esophageal CA.  2D echo showed very large pericardial effusion, 4.4cm at greatest diameter, with evidence of RV collapse.  There is an 61mHg change in MV inflow with respirations.  The IVC is dilated and plethoric.  Findings consistent with at least early tamponade.  I have a call into the [facility] KClance Bollto [facility] patient.  Will also contact Dr. HRoxan Hockeywho is on for CVTS surgery as patient will likely need a pericardial window.

## 2016-04-20 NOTE — Progress Notes (Signed)
Report called to [employer] on [facility].  All questions answered at this time.  Pt to be admitted to room 1329.

## 2016-04-20 NOTE — Progress Notes (Signed)
This RN spoke with Dr. Radford Pax with Cardiology regarding plans for potential surgery for pt. Dr. Radford Pax said she would call Dr. Roxan Hockey directly. Will pass on to receiving RN at [facility] to page Dr. Roxan Hockey ASAP when patient arrives. Will continue to monitor pt closely. Carnella Guadalajara I

## 2016-04-20 NOTE — Progress Notes (Addendum)
Pt received from cancer center. Cardiac monitoring ordered and [facility] unable to provide cardiac monitoring. Request for telemetry bed placed. Pt to be transferred to [facility] RM24. Receiving RN Stanton Kidney to take report.  EMLA cream ordered for patient to have port a cath accessed. Will continue to monitor and give full report to [facility] RN.   Pt taken to CT by PCT. CT to take pt to 1424 after imaging is completed. [facility] RN Stanton Kidney and CT tech aware.  Full report given to [facility].   Critical value lactic acid 3.7. Value communicated to MD Coralyn Pear and receiving RN Stanton Kidney.

## 2016-04-20 NOTE — Progress Notes (Signed)
Utilization Review completed.  Kreston Ahrendt RN CM  Completed in [place]. 

## 2016-04-20 NOTE — Progress Notes (Signed)
Follow-up note  Lab showing lactic acid of 3.7. Will place patient on sepsis protocol, provide 1 L bolus of normal saline, continue maintenance fluids at 150 mL/hour, repeat lactic acid levels per protocol.

## 2016-04-20 NOTE — Progress Notes (Addendum)
Called by [facility] (Kirby[place]Graham NP) regarding this patient who has esophageal cancer and CT which showed a large pericardial effusion. I have called the echo tech on call Laverna Peace) who has agreed to perform a full 2-D echocardiogram with Doppler. I have also spoken with Dr. Fransico Him (cardiologist on call) who will review the images and then determine management.

## 2016-04-20 NOTE — Progress Notes (Addendum)
This is an unfortunate 50 yo male who has a known diagnosis of esophageal cancer 2014 s/p chemo and radiation last year at [facility]. He now sees Dr. Benay Spice at our [facility]. Mr. Gasparini was admitted today after failing outpt tx for PNA. Found to have persistent PNA on admission along with sepsis. CT chest, abd and pelvis was done this afternoon to check for progression/mets. Dr. Pascal Lux of radiology paged this NP due to abnormal CT chest. Per Dr. Pascal Lux, pt has the "largest pericardial effusion" he has ever seen. Also, noted progression of esophageal cancer.  Writer paged cardiology on call who will handle situation from here. NP to bedside to speak to pt about results and plan.  Clance Boll, NP [facility] Apparently, [facility] received a call from Dr. Benay Spice, oncology, who suggested CTS be called and pt transferred to [facility]. Dr. Roxan Hockey contacted by [facility] and is aware pt is coming to [facility]. This NP called cardiology to update them on plan. Echo has been completed by cardiology.  KJKG, NP [facility]

## 2016-04-20 NOTE — Progress Notes (Signed)
Echocardiogram 2D Echocardiogram has been performed.  Joel Osborne 04/20/2016, 9:22 PM

## 2016-04-20 NOTE — Progress Notes (Signed)
CRITICAL VALUE ALERT  Critical value received:  Lactic Acid 3.2  Date of notification:  04/20/2016  Time of notification:  2100  Critical value read back:Yes.    Nurse who received alert:  Carnella Guadalajara I   MD notified (1st page):  Tylene Fantasia  Time of first page:  2200

## 2016-04-20 NOTE — Progress Notes (Signed)
Pharmacy Antibiotic Note  Joel Osborne is a 50 y.o. male admitted on 04/20/2016 with pneumonia.  Patient's PMH significant for adenocarcinoma of the distal esophagus/gastric cardia, diagnosed in 2014.  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  Currently on Pembrolizumab per Dr. Benay Spice.  He has had progressively worsening cough associated with clear/yellow sputum production, subjective fevers, chest x-ray performed on 04/18/2016 showed bilateral lower lobe airspace opacities consistent with pneumonia.  Patient was started on Levaquin but after 2 days of treatment, was admitted due to lack of improvement.  Pharmacy has been consulted for Vancomycin dosing.  Cefepime started by MD.   Plan: Vancomycin 1250 mg x 1 then 1g IV q12h. Continue Cefepime 1g IV q8h as ordered. F/u SCr, culture results, trough levels as indicated.  Height: 6' (182.9 cm) Weight: 184 lb 14.4 oz (83.87 kg) IBW/kg (Calculated) : 77.6  Temp (24hrs), Avg:99.6 F (37.6 C), Min:99.5 F (37.5 C), Max:99.6 F (37.6 C)   Recent Labs Lab 04/20/16 1640  WBC 13.5*  CREATININE 1.25*  LATICACIDVEN 3.7*    Estimated Creatinine Clearance: 76.7 mL/min (by C-G formula based on Cr of 1.25).    Allergies  Allergen Reactions  . Lactose Intolerance (Gi) Other (See Comments)    Upset stomach     Antimicrobials this admission: 5/11 Vancomycin >>  5/11 Cefepime >>   Dose adjustments this admission: -  Microbiology results: 5/11 BCx: sent Strep pneumo ur ag: ordered Legionella ur ag: ordered Sputum: ordered   Thank you for allowing pharmacy to be a part of this patient's care.  Hershal Coria 04/20/2016 5:29 PM

## 2016-04-20 NOTE — Progress Notes (Addendum)
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 He has completed 2 doses of Levaquin. He notes no improvement in the dyspnea or cough. No fever or shaking chills. When he coughs he notes "numbness/tingling" in his face and arms. He has been nauseated. Last bowel movement 3 days ago. Upper abdominal pain over the past 2 days. Appetite is poor. He is having trouble sleeping.  Objective:  Vital signs in last 24 hours:  Blood pressure 117/77, pulse 128, temperature 99.5 F (37.5 C), temperature source Oral, resp. rate 19, height 6' (1.829 m), weight 184 lb 14.4 oz (83.87 kg), SpO2 98 %.    HEENT: No thrush or ulcers. He has a tracheostomy. Resp: Lungs are clear. Increased respiratory rate. Cardio: Regular, tachycardic. GI: Abdomen soft, nontender. No hepatomegaly. Vascular: Trace lower leg edema bilaterally. Calves soft and nontender. Port-A-Cath without erythema.    Lab Results:  Lab Results  Component Value Date   WBC 5.9 04/07/2016   HGB 13.2 04/07/2016   HCT 40.2 04/07/2016   MCV 88.4 04/07/2016   PLT 200 04/07/2016   NEUTROABS 4.7 04/07/2016    Imaging:  No results found.  Medications: I have reviewed the patient's current medications.  Assessment/Plan: 1.Adenocarcinoma of the distal esophagus/gastric cardia-status post an endoscopic biopsy on 03/18/2013 confirming adenocarcinoma, HER-2/neu not amplified  -Staging CT scans 03/20/2013 confirmed a distal esophageal/gastric cardia mass, distal paraesophageal lymph nodes and paratracheal nodes concerning for metastatic lymphadenopathy  -A PET scan 03/27/2013 with a multifocal area of hypermetabolic activity involving the thoracic esophagus extending into the gastric cardia and hypermetabolic mediastinal nodes, no distant metastatic disease  -Initiation of radiation 04/07/2013 and weekly Taxol/carboplatin 04/08/2013, radiation  completed 05/15/2013  -Restaging PET scan 06/09/2013-no evidence of metastatic disease, no hypermetabolic lymphadenopathy  -Esophagogastrectomy 07/07/2013 revealed a ypT1b,ypN0 tumor with negative surgical margins  -CT neck 06/09/2014 revealed a necrotic lymph node in the right tracheoesophageal groove  -PET scan 11/15/2014 revealed a single focus of hypermetabolism corresponding to the necrotic right upper mediastinum lymph node -EUS biopsy of the paraesophageal lymph node 06/25/2012 confirmed metastatic adenocarcinoma  -Radiation completed 08/11/2014 -Rising CEA May 2016 -PET scan 07/13/2015 with hypermetabolic right paratracheal and right hilar adenopathy -Cycle 1 FOLFOX 08/03/2015 -Cycle 2 FOLFOX 08/17/2015 - cycle 3 FOLFOX 08/31/2015 (oxaliplatin dose reduced secondary to thrombocytopenia)  -Cycle 4 FOLFOX 09/14/2015 -Cycle 5 FOLFOX 09/28/2015 - restaging CTs 09/30/2015 with stable disease involving neck/chest lymph nodes, a slight increase in size of left upper lobe lung nodule  - cycle 6 FOLFOX 10/12/2015  -FOLFOX discontinued secondary to development of bilateral vocal cord paralysis November 2016 - CT scan of the neck and chest on 11/22/15 revealed no definite disease in the neck. At the thoracic inlet region, there is questionably slight increase in prominence of soft tissue at the right tracheoesophageal groove. This could be an enlarging node or mass but could also relate to the esophagectomy and pull-through surgery.Enlarging lingular and left lower lobe nodules are worrisome for metastatic disease. Peribronchovascular nodular consolidation in the anterior right lower lobe, slightly more prominent, indeterminate. Cholelithiasis. Left diaphragmatic hernia, stable. -Xeloda 1 week on/1 week off beginning 12/11/2015 -Restaging CTs of the neck and chest 02/07/2016 revealed enlargement of 2 left-sided lung nodules, no other evidence of disease progression -Cycle 1 Pembrolizumab  02/25/2016  -Cycle 2 Pembrolizumab 03/17/2016 -Cycle 3 Pembrolizumab 04/07/2016  2. Indeterminate 15 mm right liver lesion on the CT scan 03/20/2013, MRI  of the liver 06/09/2013 confirmed a right liver hemangioma  3. Anorexia/weight loss and solid dysphagia secondary to #1 -improved 4. History of a colon polyp, status post a polypectomy October 2013  5. history of Thrombocytopenia secondary chemotherapy-the carboplatin wase dose reduced with week 5 of chemotherapy chemotherapy  6. hoarseness secondary to right vocal cord paralysis-CT/PET scan imaging consistent with a malignant right paratracheal lymph node causing the vocal cord paralysis  -Status post a laryngeal plasty at [facility] on 09/02/2014 7. Esophageal stricture-status post dilation 06/08/2015 -Food impaction removal 09/29/2015 -Esophageal stricture dilation 10/06/2015 8. Port-A-Cath placement 07/20/2015 9. Delayed nausea following cycle 1 FOLFOX. Aloxi and Emend added with cycle 2. 10. Thrombocytopenia secondary to chemotherapy  11. Emotional lability/depression-prophylactic Decadron discontinued; Remeron initiated 12. Progressive hoarseness November 2016, diagnosed with bilateral vocal cord paralysis, status post a tracheostomy and bilateral injection laryngoplasty at [facility] on 10/27/2015 13. Three-day history of a cough. Chest x-ray 04/18/2016 with airspace opacity noted in both lower lobes. Heart mildly enlarged.  Disposition: [name] has had no improvement in the respiratory symptoms despite 48 hours of antibiotic therapy. We are recommending hospitalization for additional evaluation/treatment. The differential diagnosis includes pneumonia (possibly atypical), pneumonitis, pulmonary embolus.   Dr. Benay Osborne has spoken with the hospitalist. [name] will be admitted to [facility].  Patient seen with Dr. Benay Osborne. 25 minutes were spent face-to-face at today's visit with the majority of that time involved in  counseling/coordination of care.   Joel Osborne, Joel Osborne ANP/GNP-BC   04/20/2016  2:03 PM  This was a shared visit with Joel Osborne. Dr.Pollinger was interviewed and examined. I discussed the case with Dr. Coralyn Osborne. He will be admitted for treatment and further evaluation. He may have pneumonia, drug-induced pneumonitis, or tumor spread in the lungs, or pulmonary edema. I recommend broad-spectrum antibiotics and a chest CT.  Joel Osborne, M.D.

## 2016-04-20 NOTE — Consult Note (Signed)
Reason for Consult:Pericardial effusion Referring Physician: Dr. Danton Sewer Joel Osborne is an 50 y.o. male.  HPI: 50 yo male with known metastatic adenocarcinoma who presents with a cc/o cough.  Joel Osborne is a 50 y.o. male with a past medical history significant for adenocarcinoma of the distal esophagus/gastric cardia, diagnosed in 2014. He had an esophagectomy by Dr. Servando Snare. In 2016 he was treated with 6 cycles of FOLFOX. 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 He presented to the clinic today for follow-up visit. Over the past week he has had progressively worsening cough associated with clear/yellow sputum production, subjective fevers, chills, generalized weakness, malaise, fatigue, poor tolerance to physical exertion. He had a chest x-ray performed on 04/18/2016 that showed bilateral lower lobe airspace opacities consistent with pneumonia and an enlarged cardiac silouette. He was started on Levaquin but did not improve.  He was admitted today and a CT chest showed a large pericardial effusion. There was also progression of his metastatic disease. He has some chest pain he attributes to frequent coughing. He says he feels better than he did on admission.  Past Medical History  Diagnosis Date  . Hyperlipemia   . Colon polyp   . Pre-diabetes   . GERD (gastroesophageal reflux disease)   . History of radiation therapy 04/07/13-05-15-13    [place]50.4Gy/73[place]  . Sleep apnea     no c-pap at this time  . Anxiety   . [place]     [place] denies.  . [place] 03/[place]    Adenocarcinoma  . Diverticulosis     Past Surgical History  Procedure Laterality Date  . Appendectomy    . Lipoma excision    . Edg  03/18/2013    Esophageal Mass  . Complete esophagectomy N/A 07/07/2013    Procedure: ESOPHAGECTOMY COMPLETE;  Surgeon: EGrace Isaac MD;  [place]  Service: Thoracic;  Laterality: N/A;  transhiatel esophagectomy, feeding jejunostomy  . Video bronchoscopy N/A 07/07/2013    Procedure: VIDEO BRONCHOSCOPY;  Surgeon: EGrace Isaac MD;  [place]  Service: Thoracic;  Laterality: N/A;  . Eus N/A 06/25/2014    Procedure: UPPER ENDOSCOPIC ULTRASOUND (EUS) LINEAR;  Surgeon: DMilus Banister MD;  [facility];  Service: Endoscopy;  Laterality: N/A;  . Esophagogastroduodenoscopy N/A 07/02/2014    Procedure: ESOPHAGOGASTRODUODENOSCOPY (EGD);  Surgeon: CGatha Mayer MD;  [facility];  Service: Endoscopy;  Laterality: N/A;  . Vocal chord surgery      pushed right vocal cord more centered- September 2015  . Esophagogastroduodenoscopy N/A 02/16/2015    Procedure: ESOPHAGOGASTRODUODENOSCOPY (EGD);  Surgeon: DLafayette Dragon MD;  [facility];  Service: Endoscopy;  Laterality: N/A;  . Upper gastrointestinal endoscopy    . Colonoscopy    . Esophagogastroduodenoscopy N/A 09/29/2015    Procedure: ESOPHAGOGASTRODUODENOSCOPY (EGD);  Surgeon: MLadene Artist MD;  [facility];  Service: Endoscopy;  Laterality: N/A;    Family History  Problem Relation Age of Onset  . Colon cancer Mother     dx in her 535s . Diabetes Mother   . Breast cancer Maternal Aunt 81  . Diabetes Maternal Aunt   . Esophageal cancer Maternal Aunt 78  . Kidney failure Brother 454 . Stomach cancer Neg Hx   . Rectal cancer Neg Hx   . Hypertension Brother 435  Social History:  reports that he has never smoked. He has never used smokeless tobacco. He reports that he drinks about 1.2 oz of alcohol per week. He reports that he does not use illicit drugs.  Allergies:  Allergies  Allergen Reactions  . Lactose Intolerance (Gi) Other (See Comments)    Upset stomach     Medications:  Prior to Admission:  Prescriptions prior to admission  Medication Sig Dispense Refill Last Dose  . acetaminophen  (TYLENOL) 500 MG tablet Take 1,000 mg by mouth every 6 (six) hours as needed for mild pain or moderate pain.   Unknown  . anti-nausea (EMETROL) solution Take 10 mLs by mouth every 15 (fifteen) minutes as needed for nausea or vomiting.   04/20/2016 at Unknown time  . brompheniramine-pseudoephedrine (DIMETAPP) 1-15 MG/5ML ELIX Take by mouth 2 (two) times daily as needed for allergies.   Past Week at Unknown time  . eszopiclone (LUNESTA) 1 MG TABS tablet TAKE 1-2 TABLETS BY MOUTH AT BEDTIME AS NEEDED FOR SLEEP 30 tablet 0 04/19/2016 at Unknown time  . guaiFENesin (ROBITUSSIN) 100 MG/5ML liquid Take 200 mg by mouth 3 (three) times daily as needed for cough.   Past Week at Unknown time  . ibuprofen (ADVIL,MOTRIN) 200 MG tablet Take 400 mg by mouth every 6 (six) hours as needed for moderate pain (soreness).   Past Week at Unknown time  . lactase (LACTAID) 3000 UNITS tablet Take 3,000 Units by mouth daily as needed (lactose intolerance). For lactose intolerance   Past Week at Unknown time  . levofloxacin (LEVAQUIN) 25 MG/ML solution Take 20 mLs (500 mg total) by mouth daily. 140 mL 0 04/19/2016 at Unknown time  . lidocaine-prilocaine (EMLA) cream Apply 1 application topically as needed. Apply tablespoon to PAC 2 hours prior to stick and cover with plastic wrap 30 g 5 04/07/2016  . mirtazapine (REMERON) 15 MG tablet TAKE ONE TABLET BY MOUTH AT BEDTIME 30 tablet 1 04/19/2016 at Unknown time  . Multiple Vitamin (MULTIVITAMIN WITH MINERALS) TABS tablet Take 1 tablet by mouth daily.   Past Week at Unknown time  . dextromethorphan-guaiFENesin (MUCINEX DM) 30-600 MG 12hr tablet Take 1 tablet by mouth 2 (two) times daily. Reported on 04/20/2016   Not Taking  . OVER THE COUNTER MEDICATION Take 28 mg by mouth 2 (two) times daily. Hemp Extract   Taking    Results for orders placed or performed during the hospital encounter of 04/20/16 (from the past 48 hour(s))  Culture, blood (routine x 2) Call MD if unable to obtain prior  to antibiotics being given     Status: None (Preliminary result)   Collection Time: 04/20/16  4:37 PM  Result Value Ref Range   Specimen Description BLOOD LEFT ANTECUBITAL    Special Requests BOTTLES DRAWN AEROBIC AND ANAEROBIC 5CC    Culture PENDING    Report Status PENDING   Culture, blood (routine x 2) Call MD if unable to obtain prior to antibiotics being given     Status: None (Preliminary result)   Collection Time: 04/20/16  4:37 PM  Result Value Ref Range   Specimen Description BLOOD RIGHT ARM    Special Requests BOTTLES DRAWN AEROBIC AND ANAEROBIC 10CC    Culture PENDING    Report Status PENDING   CBC     Status: Abnormal   Collection Time: 04/20/16  4:40 PM  Result Value Ref Range   WBC 13.5 (H) 4.0 - 10.5 K/uL   RBC 4.45 4.22 - 5.81 MIL/uL  Hemoglobin 12.7 (L) 13.0 - 17.0 g/dL   HCT 38.2 (L) 39.0 - 52.0 %   MCV 85.8 78.0 - 100.0 fL   MCH 28.5 26.0 - 34.0 pg   MCHC 33.2 30.0 - 36.0 g/dL   RDW 13.8 11.5 - 15.5 %   Platelets 297 150 - 400 K/uL  Comprehensive metabolic panel     Status: Abnormal   Collection Time: 04/20/16  4:40 PM  Result Value Ref Range   Sodium 137 135 - 145 mmol/L   Potassium 5.0 3.5 - 5.1 mmol/L   Chloride 99 (L) 101 - 111 mmol/L   CO2 23 22 - 32 mmol/L   Glucose, Bld 184 (H) 65 - 99 mg/dL   BUN 31 (H) 6 - 20 mg/dL   Creatinine, Ser 1.25 (H) 0.61 - 1.24 mg/dL   Calcium 8.9 8.9 - 10.3 mg/dL   Total Protein 7.1 6.5 - 8.1 g/dL   Albumin 3.5 3.5 - 5.0 g/dL   AST 898 (H) 15 - 41 U/L   ALT 918 (H) 17 - 63 U/L   Alkaline Phosphatase 145 (H) 38 - 126 U/L   Total Bilirubin 2.1 (H) 0.3 - 1.2 mg/dL   GFR calc non Af Amer >60 >60 mL/min   GFR calc Af Amer >60 >60 mL/min    Comment: (NOTE) The eGFR has been calculated using the CKD EPI equation. This calculation has not been validated in all clinical situations. eGFR's persistently <60 mL/min signify possible Chronic Kidney Disease.    Anion gap 15 5 - 15  Lactic acid, plasma     Status: Abnormal    Collection Time: 04/20/16  4:40 PM  Result Value Ref Range   Lactic Acid, Venous 3.7 (HH) 0.5 - 2.0 mmol/L    Comment: CRITICAL RESULT CALLED TO, READ BACK BY AND VERIFIED WITH: K STRUP,RN [id number] @ 1727 BY J SCOTTON   Differential     Status: Abnormal   Collection Time: 04/20/16  4:40 PM  Result Value Ref Range   Neutrophils Relative % 81 %   Neutro Abs 11.2 (H) 1.7 - 7.7 K/uL   Lymphocytes Relative 8 %   Lymphs Abs 1.0 0.7 - 4.0 K/uL   Monocytes Relative 11 %   Monocytes Absolute 1.5 (H) 0.1 - 1.0 K/uL   Eosinophils Relative 0 %   Eosinophils Absolute 0.0 0.0 - 0.7 K/uL   Basophils Relative 0 %   Basophils Absolute 0.0 0.0 - 0.1 K/uL  Procalcitonin     Status: None   Collection Time: 04/20/16  6:27 PM  Result Value Ref Range   Procalcitonin <0.10 ng/mL    Comment:        Interpretation: PCT (Procalcitonin) <= 0.5 ng/mL: Systemic infection (sepsis) is not likely. Local bacterial infection is possible. (NOTE)         ICU PCT Algorithm               Non ICU PCT Algorithm    ----------------------------     ------------------------------         PCT < 0.25 ng/mL                 PCT < 0.1 ng/mL     Stopping of antibiotics            Stopping of antibiotics       strongly encouraged.               strongly encouraged.    ----------------------------     ------------------------------         PCT level decrease by               PCT < 0.25 ng/mL       >= 80% from peak PCT       OR PCT 0.25 - 0.5 ng/mL          Stopping of antibiotics                                             encouraged.     Stopping of antibiotics           encouraged.    ----------------------------     ------------------------------       PCT level decrease by              PCT >= 0.25 ng/mL       < 80% from peak PCT        AND PCT >= 0.5 ng/mL            Continuin g antibiotics                                              encouraged.       Continuing antibiotics            encouraged.     ----------------------------     ------------------------------     PCT level increase compared          PCT > 0.5 ng/mL         with peak PCT AND          PCT >= 0.5 ng/mL             Escalation of antibiotics                                          strongly encouraged.      Escalation of antibiotics        strongly encouraged.   APTT     Status: None   Collection Time: 04/20/16  6:27 PM  Result Value Ref Range   aPTT 31 24 - 37 seconds  Protime-INR     Status: Abnormal   Collection Time: 04/20/16  6:27 PM  Result Value Ref Range   Prothrombin Time 23.0 (H) 11.6 - 15.2 seconds   INR 2.05 (H) 0.00 - 1.49  Lactic acid, plasma     Status: Abnormal   Collection Time: 04/20/16  8:30 PM  Result Value Ref Range   Lactic Acid, Venous 3.2 (HH) 0.5 - 2.0 mmol/L    Comment: CRITICAL RESULT CALLED TO, READ BACK BY AND VERIFIED WITH: JESSICA ASARO,RN [id number] @ 2114 BY J SCOTTON   Culture, sputum-assessment     Status: None   Collection Time: 04/20/16  8:32 PM  Result Value Ref Range   Specimen Description SPU    Special Requests NONE    Sputum evaluation THIS SPECIMEN IS ACCEPTABLE FOR SPUTUM CULTURE    Report Status 04/20/2016 FINAL   Urinalysis, Routine w reflex microscopic (not at [facility])     Status: Abnormal   Collection Time: 04/20/16  8:33 PM  Result Value Ref Range   Color,   Urine AMBER (A) YELLOW    Comment: BIOCHEMICALS MAY BE AFFECTED BY COLOR   APPearance CLEAR CLEAR   Specific Gravity, Urine 1.042 (H) 1.005 - 1.030   pH 5.5 5.0 - 8.0   Glucose, UA NEGATIVE NEGATIVE mg/dL   Hgb urine dipstick NEGATIVE NEGATIVE   Bilirubin Urine SMALL (A) NEGATIVE   Ketones, ur NEGATIVE NEGATIVE mg/dL   Protein, ur NEGATIVE NEGATIVE mg/dL   Nitrite NEGATIVE NEGATIVE   Leukocytes, UA NEGATIVE NEGATIVE    Comment: MICROSCOPIC NOT DONE ON URINES WITH NEGATIVE PROTEIN, BLOOD, LEUKOCYTES, NITRITE, OR GLUCOSE <1000 mg/dL.    Ct Abdomen Pelvis Wo Contrast  04/20/2016  CLINICAL DATA:  History of  esophageal carcinoma cough productive fevers chills, elevated liver enzymes EXAM: CT ABDOMEN AND PELVIS WITHOUT CONTRAST TECHNIQUE: Multidetector CT imaging of the abdomen and pelvis was performed following the standard protocol without IV contrast. COMPARISON:  04/20/2016 CT thorax FINDINGS: Small left and moderate right pleural effusion. Large pericardial effusion partially visualized. Gastric pull-up noted consistent with history of esophageal carcinoma. Partially visualized right lower lobe consolidation. Liver appears normal, as does the pancreas and as does spleen given limited evaluation without contrast. There are several small gallstones. No adrenal masses. No significant renal abnormalities. 1 cm cyst upper pole left kidney and 4 mm cyst lower pole right kidney. No hydronephrosis. IV contrast concentrated within the dependent portion of the bladder. Nonobstructive bowel gas pattern.  Small and large bowel normal. Reproductive organs normal. No significant free fluid. No significant vascular abnormalities. No significant adenopathy. No acute musculoskeletal findings. IMPRESSION: Partially visualized large pericardial effusion. Right lower lobe consolidation. Bilateral pleural effusions. Cholelithiasis. Electronically Signed   By: Skipper Cliche M.D.   On: 04/20/2016 19:49   Ct Chest W Contrast  04/20/2016  CLINICAL DATA:  Healthcare associated pneumonia. EXAM: CT CHEST WITH CONTRAST TECHNIQUE: Multidetector CT imaging of the chest was performed during intravenous contrast administration. CONTRAST:  81m ISOVUE-300 IOPAMIDOL (ISOVUE-300) INJECTION 61% COMPARISON:  02/07/2016 FINDINGS: THORACIC INLET/BODY WALL: Porta catheter on the right with tip in good position at the upper cavoatrial junction. There is tracheostomy tube which is well seated. No thoracic inlet adenopathy. MEDIASTINUM: Normal heart size. New and massive pericardial effusion measuring up to 4 cm in thickness. There is smooth mild serosal  enhancement. Pleural fluid has developed fairly rapidly, new since 02/07/2016. Changes of gastric pull-through. There is ill-defined adenopathy in the right hilum with right middle lobe bronchus obstruction and narrowing of right lower lobe pulmonary artery. This has a malignant appearance. Hilar adenopathy on the left is milder. Isolated index precarinal node has heterogeneous low-density appearance. This node measures 10 mm short axis compared 8 mm previously. LUNG WINDOWS: Septal thickening on the right which is likely from lymphatic obstruction. This appears fairly smooth there is no definitive parenchymal lymphangitic tumor. Nearly collapsed right middle lobe. Lobulated pulmonary metastases have enlarged, left lower lobe 5:81 measuring up to 23 mm compared to 19 mm previously. A lingular lobulated metastasis measures 14 mm compared to 11 previously. Small layering pleural effusions. UPPER ABDOMEN: Cholelithiasis. There is mild haziness of fat around the gallbladder but no over distention or definitive cholecystitis. Left diaphragmatic hernia containing colon and pancreatic tail. OSSEOUS: No acute fracture.  No suspicious lytic or blastic lesions. These results were called by telephone at the time of interpretation on 04/20/2016 at 7:37 pm to Dr. KBaltazar Najjarwho verbally acknowledged these results. IMPRESSION: 1. Massive low-density pericardial effusion that has developed since 02/07/2016. No pericardial mass noted.  2. 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 3. Right middle lobe airway obstruction and collapse due to right hilar adenopathy. Right lung edema attributed to malignant lymphatic obstruction. Electronically Signed   By: Jonathon  Watts M.D.   On: 04/20/2016 19:38    Review of Systems  Constitutional: Positive for fever, weight loss and malaise/fatigue.  Respiratory: Positive for cough and shortness of breath.        Trach  Cardiovascular: Positive for chest pain  (mild, pleuritic) and leg swelling (minimal).   Blood pressure 118/82, pulse 117, temperature 98.4 F (36.9 C), temperature source Oral, resp. rate 18, height 6' (1.829 m), weight 184 lb 14.4 oz (83.87 kg), SpO2 94 %. Physical Exam  Vitals reviewed. Constitutional: He is oriented to person, place, and time. He appears distressed (mild).  HENT:  Head: Normocephalic and atraumatic.  Eyes: Conjunctivae and EOM are normal. No scleral icterus.  Neck: Neck supple. JVD present. No tracheal deviation present.  Tracheostomy in place  Cardiovascular: Normal heart sounds and intact distal pulses.  Exam reveals no gallop.   No murmur heard. tachycardic at 117  Respiratory: Effort normal. He has no wheezes. He has no rales.  GI: Soft. He exhibits no distension. There is no tenderness.  Musculoskeletal: He exhibits edema (trace LE edema).  Neurological: He is alert and oriented to person, place, and time. No cranial nerve deficit.  Skin: Skin is warm and dry.    Assessment/Plan: Joel Osborne is a 49 yo man with metastatic adenocarcinoma of the esophagus. He presents with a cough, subjective fever, weakness and malaise. He has been found to have a large pericardial effusion with echocardiographic evidence of early tamponade. He needs a pericardial window to drain the effusion.  I discussed the general nature of the procedure, the need for general anesthesia, and the incision to be used with Joel Osborne and his family. I discussed the expected hospital stay, overall recovery and short and long term outcomes. I reviewed the indications, risks, benefits and alternatives. They understand the risks include, but are not limited to death, stroke, MI, DVT/PE, bleeding, possible need for transfusion, infections, other organ system dysfunction including respiratory, renal, or GI complications.   He accepts the risks and agrees to proceed.  OR has been notified  Unfortunately there was a significant delay in  contacting me. I received multiple pages to incomplete numbers. I checked with the answering service and no calls were made to them. I was available at all times. I was called at home around 10PM and requested immediate transfer to [facility], but patient refused until now.  Reegan Bouffard C Tyshana Nishida 04/20/2016, 11:25 PM      

## 2016-04-21 ENCOUNTER — Inpatient Hospital Stay (HOSPITAL_COMMUNITY): Payer: BLUE CROSS/BLUE SHIELD | Admitting: Anesthesiology

## 2016-04-21 ENCOUNTER — Inpatient Hospital Stay (HOSPITAL_COMMUNITY): Payer: BLUE CROSS/BLUE SHIELD

## 2016-04-21 ENCOUNTER — Encounter (HOSPITAL_COMMUNITY)
Admission: AD | Disposition: A | Discharge status: Home / Self Care | Payer: Self-pay | Source: Ambulatory Visit | Attending: Internal Medicine

## 2016-04-21 ENCOUNTER — Encounter (HOSPITAL_COMMUNITY): Payer: Self-pay | Admitting: Certified Registered Nurse Anesthetist

## 2016-04-21 DIAGNOSIS — D72829 Elevated white blood cell count, unspecified: Secondary | ICD-10-CM | POA: Diagnosis present

## 2016-04-21 DIAGNOSIS — R590 Localized enlarged lymph nodes: Secondary | ICD-10-CM | POA: Diagnosis present

## 2016-04-21 DIAGNOSIS — I3139 Other pericardial effusion (noninflammatory): Secondary | ICD-10-CM | POA: Diagnosis present

## 2016-04-21 DIAGNOSIS — D638 Anemia in other chronic diseases classified elsewhere: Secondary | ICD-10-CM | POA: Diagnosis present

## 2016-04-21 DIAGNOSIS — J189 Pneumonia, unspecified organism: Secondary | ICD-10-CM

## 2016-04-21 DIAGNOSIS — C7800 Secondary malignant neoplasm of unspecified lung: Secondary | ICD-10-CM

## 2016-04-21 DIAGNOSIS — I313 Pericardial effusion (noninflammatory): Secondary | ICD-10-CM

## 2016-04-21 DIAGNOSIS — A419 Sepsis, unspecified organism: Secondary | ICD-10-CM | POA: Diagnosis present

## 2016-04-21 DIAGNOSIS — J9601 Acute respiratory failure with hypoxia: Secondary | ICD-10-CM | POA: Diagnosis present

## 2016-04-21 DIAGNOSIS — I314 Cardiac tamponade: Secondary | ICD-10-CM

## 2016-04-21 DIAGNOSIS — I890 Lymphedema, not elsewhere classified: Secondary | ICD-10-CM

## 2016-04-21 DIAGNOSIS — C155 Malignant neoplasm of lower third of esophagus: Secondary | ICD-10-CM

## 2016-04-21 HISTORY — PX: SUBXYPHOID PERICARDIAL WINDOW: SHX5075

## 2016-04-21 LAB — COMPREHENSIVE METABOLIC PANEL
ALT: 1872 U/L — ABNORMAL HIGH (ref 17–63)
ANION GAP: 13 (ref 5–15)
AST: 2014 U/L — ABNORMAL HIGH (ref 15–41)
Albumin: 2.9 g/dL — ABNORMAL LOW (ref 3.5–5.0)
Alkaline Phosphatase: 126 U/L (ref 38–126)
BUN: 28 mg/dL — AB (ref 6–20)
CHLORIDE: 104 mmol/L (ref 101–111)
CO2: 22 mmol/L (ref 22–32)
CREATININE: 1.07 mg/dL (ref 0.61–1.24)
Calcium: 8.5 mg/dL — ABNORMAL LOW (ref 8.9–10.3)
Glucose, Bld: 158 mg/dL — ABNORMAL HIGH (ref 65–99)
POTASSIUM: 4.7 mmol/L (ref 3.5–5.1)
SODIUM: 139 mmol/L (ref 135–145)
TOTAL PROTEIN: 6.2 g/dL — AB (ref 6.5–8.1)
Total Bilirubin: 1.8 mg/dL — ABNORMAL HIGH (ref 0.3–1.2)

## 2016-04-21 LAB — BLOOD GAS, ARTERIAL
Acid-base deficit: 5.6 mmol/L — ABNORMAL HIGH (ref 0.0–2.0)
BICARBONATE: 17.7 meq/L — AB (ref 20.0–24.0)
Drawn by: 225631
FIO2: 0.21
O2 Saturation: 94.9 %
PATIENT TEMPERATURE: 98.6
PH ART: 7.447 (ref 7.350–7.450)
PO2 ART: 80.1 mmHg (ref 80.0–100.0)
TCO2: 18.5 mmol/L (ref 0–100)
pCO2 arterial: 26.1 mmHg — ABNORMAL LOW (ref 35.0–45.0)

## 2016-04-21 LAB — STREP PNEUMONIAE URINARY ANTIGEN: Strep Pneumo Urinary Antigen: NEGATIVE

## 2016-04-21 LAB — GLUCOSE, CAPILLARY
GLUCOSE-CAPILLARY: 149 mg/dL — AB (ref 65–99)
GLUCOSE-CAPILLARY: 109 mg/dL — AB (ref 65–99)
GLUCOSE-CAPILLARY: 108 mg/dL — AB (ref 65–99)
Glucose-Capillary: 142 mg/dL — ABNORMAL HIGH (ref 65–99)

## 2016-04-21 LAB — SURGICAL PCR SCREEN
MRSA, PCR: NEGATIVE
STAPHYLOCOCCUS AUREUS: POSITIVE — AB

## 2016-04-21 LAB — CBC WITH DIFFERENTIAL/PLATELET
Basophils Absolute: 0 10*3/uL (ref 0.0–0.1)
Basophils Relative: 0 %
EOS ABS: 0 10*3/uL (ref 0.0–0.7)
EOS PCT: 0 %
HCT: 37.3 % — ABNORMAL LOW (ref 39.0–52.0)
Hemoglobin: 11.8 g/dL — ABNORMAL LOW (ref 13.0–17.0)
LYMPHS ABS: 0.9 10*3/uL (ref 0.7–4.0)
LYMPHS PCT: 6 %
MCH: 28 pg (ref 26.0–34.0)
MCHC: 31.6 g/dL (ref 30.0–36.0)
MCV: 88.4 fL (ref 78.0–100.0)
MONO ABS: 2 10*3/uL — AB (ref 0.1–1.0)
Monocytes Relative: 13 %
Neutro Abs: 12.5 10*3/uL — ABNORMAL HIGH (ref 1.7–7.7)
Neutrophils Relative %: 81 %
PLATELETS: 219 10*3/uL (ref 150–400)
RBC: 4.22 MIL/uL (ref 4.22–5.81)
RDW: 14 % (ref 11.5–15.5)
WBC: 15.4 10*3/uL — AB (ref 4.0–10.5)

## 2016-04-21 LAB — GRAM STAIN

## 2016-04-21 LAB — HIV ANTIBODY (ROUTINE TESTING W REFLEX): HIV Screen 4th Generation wRfx: NONREACTIVE

## 2016-04-21 LAB — TYPE AND SCREEN
ABO/RH(D): A POS
Antibody Screen: NEGATIVE

## 2016-04-21 SURGERY — CREATION, PERICARDIAL WINDOW, SUBXIPHOID APPROACH
Anesthesia: General | Site: Chest

## 2016-04-21 MED ORDER — EPHEDRINE 5 MG/ML INJ
INTRAVENOUS | Status: AC
  Filled 2016-04-21: qty 10

## 2016-04-21 MED ORDER — SUGAMMADEX SODIUM 500 MG/5ML IV SOLN
INTRAVENOUS | Status: DC | PRN
  Administered 2016-04-21: 350 mg via INTRAVENOUS

## 2016-04-21 MED ORDER — FENTANYL CITRATE (PF) 250 MCG/5ML IJ SOLN
INTRAMUSCULAR | Status: AC
  Filled 2016-04-21: qty 5

## 2016-04-21 MED ORDER — ACETAMINOPHEN 500 MG PO TABS
1000.0000 mg | ORAL_TABLET | Freq: Four times a day (QID) | ORAL | Status: AC
  Filled 2016-04-21: qty 2

## 2016-04-21 MED ORDER — POTASSIUM CHLORIDE IN NACL 20-0.45 MEQ/L-% IV SOLN
INTRAVENOUS | Status: DC
  Administered 2016-04-21 – 2016-04-24 (×3): via INTRAVENOUS
  Filled 2016-04-21 (×10): qty 1000

## 2016-04-21 MED ORDER — LACTATED RINGERS IV SOLN
INTRAVENOUS | Status: DC | PRN
  Administered 2016-04-21 (×2): via INTRAVENOUS

## 2016-04-21 MED ORDER — ETOMIDATE 2 MG/ML IV SOLN
INTRAVENOUS | Status: AC
Start: 2016-04-21 — End: 2016-04-21
  Filled 2016-04-21: qty 10

## 2016-04-21 MED ORDER — POTASSIUM CHLORIDE 10 MEQ/50ML IV SOLN: 10.0000 meq | Freq: Every day | INTRAVENOUS | Status: DC | PRN

## 2016-04-21 MED ORDER — ONDANSETRON HCL 4 MG/2ML IJ SOLN
4.0000 mg | Freq: Four times a day (QID) | INTRAMUSCULAR | Status: DC | PRN
Start: 2016-04-21 — End: 2016-04-26

## 2016-04-21 MED ORDER — ONDANSETRON HCL 4 MG/2ML IJ SOLN
INTRAMUSCULAR | Status: AC
  Filled 2016-04-21: qty 2

## 2016-04-21 MED ORDER — HYDROMORPHONE HCL 1 MG/ML IJ SOLN
0.2500 mg | INTRAMUSCULAR | Status: DC | PRN
  Administered 2016-04-21 (×4): 0.5 mg via INTRAVENOUS

## 2016-04-21 MED ORDER — FENTANYL CITRATE (PF) 100 MCG/2ML IJ SOLN
INTRAMUSCULAR | Status: DC | PRN
  Administered 2016-04-21 (×3): 50 ug via INTRAVENOUS

## 2016-04-21 MED ORDER — ROCURONIUM BROMIDE 50 MG/5ML IV SOLN
INTRAVENOUS | Status: AC
  Filled 2016-04-21: qty 1

## 2016-04-21 MED ORDER — ALBUTEROL SULFATE HFA 108 (90 BASE) MCG/ACT IN AERS
INHALATION_SPRAY | RESPIRATORY_TRACT | Status: DC | PRN
  Administered 2016-04-21: 4 via RESPIRATORY_TRACT

## 2016-04-21 MED ORDER — KETAMINE HCL 100 MG/ML IJ SOLN
INTRAMUSCULAR | Status: AC
  Filled 2016-04-21: qty 1

## 2016-04-21 MED ORDER — 0.9 % SODIUM CHLORIDE (POUR BTL) OPTIME
TOPICAL | Status: DC | PRN
  Administered 2016-04-21: 2000 mL

## 2016-04-21 MED ORDER — MIDAZOLAM HCL 2 MG/2ML IJ SOLN
INTRAMUSCULAR | Status: AC
  Filled 2016-04-21: qty 2

## 2016-04-21 MED ORDER — SENNOSIDES-DOCUSATE SODIUM 8.6-50 MG PO TABS
1.0000 | ORAL_TABLET | Freq: Every day | ORAL | Status: DC
  Administered 2016-04-22 – 2016-04-25 (×3): 1 via ORAL
  Filled 2016-04-21 (×4): qty 1

## 2016-04-21 MED ORDER — HYDROMORPHONE HCL 1 MG/ML IJ SOLN
INTRAMUSCULAR | Status: AC
  Filled 2016-04-21: qty 1

## 2016-04-21 MED ORDER — ACETAMINOPHEN 160 MG/5ML PO SOLN
1000.0000 mg | Freq: Four times a day (QID) | ORAL | Status: AC
  Administered 2016-04-21 – 2016-04-26 (×18): 1000 mg via ORAL
  Filled 2016-04-21 (×18): qty 40.6

## 2016-04-21 MED ORDER — HYDROMORPHONE HCL 1 MG/ML IJ SOLN
INTRAMUSCULAR | Status: AC
  Administered 2016-04-21: 0.5 mg via INTRAVENOUS
  Filled 2016-04-21: qty 1

## 2016-04-21 MED ORDER — PROPOFOL 10 MG/ML IV BOLUS
INTRAVENOUS | Status: AC
  Filled 2016-04-21: qty 20

## 2016-04-21 MED ORDER — MUPIROCIN 2 % EX OINT
1.0000 "application " | TOPICAL_OINTMENT | Freq: Two times a day (BID) | CUTANEOUS | Status: AC
  Administered 2016-04-21 – 2016-04-25 (×9): 1 via NASAL
  Filled 2016-04-21 (×2): qty 22

## 2016-04-21 MED ORDER — SUGAMMADEX SODIUM 500 MG/5ML IV SOLN
INTRAVENOUS | Status: AC
  Filled 2016-04-21: qty 5

## 2016-04-21 MED ORDER — CHLORHEXIDINE GLUCONATE CLOTH 2 % EX PADS: 6.0000 | MEDICATED_PAD | Freq: Every day | CUTANEOUS | Status: DC

## 2016-04-21 MED ORDER — ETOMIDATE 2 MG/ML IV SOLN
INTRAVENOUS | Status: DC | PRN
  Administered 2016-04-21: 8 mg via INTRAVENOUS
  Administered 2016-04-21: 12 mg via INTRAVENOUS

## 2016-04-21 MED ORDER — LIDOCAINE HCL (CARDIAC) 20 MG/ML IV SOLN
INTRAVENOUS | Status: DC | PRN
  Administered 2016-04-21: 80 mg via INTRAVENOUS

## 2016-04-21 MED ORDER — PHENYLEPHRINE 40 MCG/ML (10ML) SYRINGE FOR IV PUSH (FOR BLOOD PRESSURE SUPPORT)
PREFILLED_SYRINGE | INTRAVENOUS | Status: AC
  Filled 2016-04-21: qty 10

## 2016-04-21 MED ORDER — ROCURONIUM BROMIDE 100 MG/10ML IV SOLN
INTRAVENOUS | Status: DC | PRN
  Administered 2016-04-21: 40 mg via INTRAVENOUS

## 2016-04-21 MED ORDER — MIDAZOLAM HCL 5 MG/5ML IJ SOLN
INTRAMUSCULAR | Status: DC | PRN
  Administered 2016-04-21: 2 mg via INTRAVENOUS

## 2016-04-21 MED ORDER — LIDOCAINE 2% (20 MG/ML) 5 ML SYRINGE
INTRAMUSCULAR | Status: AC
  Filled 2016-04-21: qty 5

## 2016-04-21 MED ORDER — ONDANSETRON HCL 4 MG/2ML IJ SOLN
INTRAMUSCULAR | Status: DC | PRN
  Administered 2016-04-21: 4 mg via INTRAVENOUS

## 2016-04-21 MED ORDER — BISACODYL 5 MG PO TBEC
10.0000 mg | DELAYED_RELEASE_TABLET | Freq: Every day | ORAL | Status: DC
  Administered 2016-04-21 – 2016-04-25 (×2): 10 mg via ORAL
  Filled 2016-04-21 (×4): qty 2

## 2016-04-21 SURGICAL SUPPLY — 39 items
CANISTER SUCTION 2500CC (MISCELLANEOUS) ×3 IMPLANT
CONN ST 1/4X3/8  BEN (MISCELLANEOUS) ×2
CONN ST 1/4X3/8 BEN (MISCELLANEOUS) IMPLANT
CONT SPEC 4OZ CLIKSEAL STRL BL (MISCELLANEOUS) ×8 IMPLANT
DRAIN CHANNEL 28F RND 3/8 FF (WOUND CARE) ×2 IMPLANT
DRAPE LAPAROSCOPIC ABDOMINAL (DRAPES) ×3 IMPLANT
ELECT REM PT RETURN 9FT ADLT (ELECTROSURGICAL) ×3
ELECTRODE REM PT RTRN 9FT ADLT (ELECTROSURGICAL) ×1 IMPLANT
GAUZE SPONGE 4X4 12PLY STRL (GAUZE/BANDAGES/DRESSINGS) ×6 IMPLANT
GLOVE BIO SURGEON STRL SZ 6 (GLOVE) ×6 IMPLANT
GLOVE BIOGEL PI IND STRL 6.5 (GLOVE) IMPLANT
GLOVE BIOGEL PI IND STRL 7.0 (GLOVE) IMPLANT
GLOVE BIOGEL PI INDICATOR 6.5 (GLOVE) ×4
GLOVE BIOGEL PI INDICATOR 7.0 (GLOVE) ×4
GLOVE SURG SIGNA 7.5 PF LTX (GLOVE) ×6 IMPLANT
GOWN STRL REUS W/ TWL LRG LVL3 (GOWN DISPOSABLE) ×1 IMPLANT
GOWN STRL REUS W/ TWL XL LVL3 (GOWN DISPOSABLE) ×1 IMPLANT
GOWN STRL REUS W/TWL LRG LVL3 (GOWN DISPOSABLE) ×6
GOWN STRL REUS W/TWL XL LVL3 (GOWN DISPOSABLE) ×3
KIT BASIN OR (CUSTOM PROCEDURE TRAY) ×3 IMPLANT
KIT ROOM TURNOVER OR (KITS) ×3 IMPLANT
NS IRRIG 1000ML POUR BTL (IV SOLUTION) ×6 IMPLANT
PACK CHEST (CUSTOM PROCEDURE TRAY) ×3 IMPLANT
PAD ARMBOARD 7.5X6 YLW CONV (MISCELLANEOUS) ×6 IMPLANT
PAD ELECT DEFIB RADIOL ZOLL (MISCELLANEOUS) ×3 IMPLANT
SPONGE GAUZE 4X4 12PLY STER LF (GAUZE/BANDAGES/DRESSINGS) ×4 IMPLANT
SUT SILK  1 MH (SUTURE) ×2
SUT SILK 1 MH (SUTURE) ×1 IMPLANT
SUT VIC AB 1 CTX 36 (SUTURE) ×3
SUT VIC AB 1 CTX36XBRD ANBCTR (SUTURE) ×1 IMPLANT
SUT VIC AB 2-0 CTX 36 (SUTURE) ×3 IMPLANT
SUT VIC AB 3-0 X1 27 (SUTURE) ×3 IMPLANT
SYSTEM SAHARA CHEST DRAIN ATS (WOUND CARE) ×3 IMPLANT
TAPE CLOTH SURG 4X10 WHT LF (GAUZE/BANDAGES/DRESSINGS) ×4 IMPLANT
TOWEL OR 17X24 6PK STRL BLUE (TOWEL DISPOSABLE) ×3 IMPLANT
TOWEL OR 17X26 10 PK STRL BLUE (TOWEL DISPOSABLE) ×6 IMPLANT
TRAP SPECIMEN MUCOUS 40CC (MISCELLANEOUS) ×8 IMPLANT
TRAY FOLEY SILVER 16FR TEMP (SET/KITS/TRAYS/PACK) ×2 IMPLANT
WATER STERILE IRR 1000ML POUR (IV SOLUTION) ×6 IMPLANT

## 2016-04-21 NOTE — Progress Notes (Addendum)
IP PROGRESS NOTE  Subjective:   He was admitted yesterday with a cough, dyspnea, and fever. He was transferred to [facility] last night after a CT revealed a large pericardial effusion. He underwent a pericardial window procedure earlier today.    Objective: Vital signs in last 24 hours: Blood pressure 95/72, pulse 88, temperature 97.6 F (36.4 C), temperature source Oral, resp. rate 16, height 6' (1.829 m), weight 192 lb 0.3 oz (87.1 kg), SpO2 96 %.  Intake/Output from previous day: 05/11 0701 - 05/12 0700 In: 1337.5 [I.V.:1037.5; IV Piggyback:300] Out: 525 [Urine:525]  Physical Exam:  HEENT: Tracheostomy in place Lungs: Loud upper airway sounds, no respiratory distress Cardiac: Regular rate and rhythm Abdomen: Subxiphoid chest tube, no hepatomegaly, nontender Extremities: No leg edema   Portacath/PICC-without erythema  Lab Results:  Recent Labs  04/20/16 1640 04/21/16 0440  WBC 13.5* 15.4*  HGB 12.7* 11.8*  HCT 38.2* 37.3*  PLT 297 219    BMET  Recent Labs  04/20/16 1640 04/21/16 0440  NA 137 139  K 5.0 4.7  CL 99* 104  CO2 23 22  GLUCOSE 184* 158*  BUN 31* 28*  CREATININE 1.25* 1.07  CALCIUM 8.9 8.5*    Studies/Results: Ct Abdomen Pelvis Wo Contrast  04/20/2016  CLINICAL DATA:  History of esophageal carcinoma cough productive fevers chills, elevated liver enzymes EXAM: CT ABDOMEN AND PELVIS WITHOUT CONTRAST TECHNIQUE: Multidetector CT imaging of the abdomen and pelvis was performed following the standard protocol without IV contrast. COMPARISON:  04/20/2016 CT thorax FINDINGS: Small left and moderate right pleural effusion. Large pericardial effusion partially visualized. Gastric pull-up noted consistent with history of esophageal carcinoma. Partially visualized right lower lobe consolidation. Liver appears normal, as does the pancreas and as does spleen given limited evaluation without contrast. There are several small gallstones. No adrenal masses. No  significant renal abnormalities. 1 cm cyst upper pole left kidney and 4 mm cyst lower pole right kidney. No hydronephrosis. IV contrast concentrated within the dependent portion of the bladder. Nonobstructive bowel gas pattern.  Small and large bowel normal. Reproductive organs normal. No significant free fluid. No significant vascular abnormalities. No significant adenopathy. No acute musculoskeletal findings. IMPRESSION: Partially visualized large pericardial effusion. Right lower lobe consolidation. Bilateral pleural effusions. Cholelithiasis. Electronically Signed   By: Skipper Cliche M.D.   On: 04/20/2016 19:49   Ct Chest W Contrast  04/20/2016  CLINICAL DATA:  Healthcare associated pneumonia. EXAM: CT CHEST WITH CONTRAST TECHNIQUE: Multidetector CT imaging of the chest was performed during intravenous contrast administration. CONTRAST:  20m ISOVUE-300 IOPAMIDOL (ISOVUE-300) INJECTION 61% COMPARISON:  02/07/2016 FINDINGS: THORACIC INLET/BODY WALL: Porta catheter on the right with tip in good position at the upper cavoatrial junction. There is tracheostomy tube which is well seated. No thoracic inlet adenopathy. MEDIASTINUM: Normal heart size. New and massive pericardial effusion measuring up to 4 cm in thickness. There is smooth mild serosal enhancement. Pleural fluid has developed fairly rapidly, new since 02/07/2016. Changes of gastric pull-through. There is ill-defined adenopathy in the right hilum with right middle lobe bronchus obstruction and narrowing of right lower lobe pulmonary artery. This has a malignant appearance. Hilar adenopathy on the left is milder. Isolated index precarinal node has heterogeneous low-density appearance. This node measures 10 mm short axis compared 8 mm previously. LUNG WINDOWS: Septal thickening on the right which is likely from lymphatic obstruction. This appears fairly smooth there is no definitive parenchymal lymphangitic tumor. Nearly collapsed right middle lobe.  Lobulated pulmonary metastases have enlarged, left  lower lobe 5:81 measuring up to 23 mm compared to 19 mm previously. A lingular lobulated metastasis measures 14 mm compared to 11 previously. Small layering pleural effusions. UPPER ABDOMEN: Cholelithiasis. There is mild haziness of fat around the gallbladder but no over distention or definitive cholecystitis. Left diaphragmatic hernia containing colon and pancreatic tail. OSSEOUS: No acute fracture.  No suspicious lytic or blastic lesions. These results were called by telephone at the time of interpretation on 04/20/2016 at 7:37 pm to Dr. Baltazar Najjar who verbally acknowledged these results. IMPRESSION: 1. Massive low-density pericardial effusion that has developed since 02/07/2016. No pericardial mass noted. 2. 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 3. Right middle lobe airway obstruction and collapse due to right hilar adenopathy. Right lung edema attributed to malignant lymphatic obstruction. Electronically Signed   By: Monte Fantasia M.D.   On: 04/20/2016 19:38   Dg Chest Port 1 View  04/21/2016  CLINICAL DATA:  Postop chest, pericardial effusion EXAM: PORTABLE CHEST 1 VIEW COMPARISON:  CT chest dated 04/20/2016 FINDINGS: Patient is rotated. Tracheostomy in satisfactory position. The heart is top-normal in size. Mild perihilar edema. Small left pleural effusion. Mild right infrahilar opacity, likely atelectasis. Right chest port terminates the cavoatrial junction. IMPRESSION: Mild perihilar edema with small left pleural effusion. Electronically Signed   By: Julian Hy M.D.   On: 04/21/2016 09:43    Medications: I have reviewed the patient's current medications.  Assessment/Plan:  1. 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  Metastatic lung nodules confirmed on CT in December 2016  Currently being treated with pembrolizumab, last  cycle given 04/07/2016  CT 04/20/2016 consistent with progressive disease involving a large pericardial effusion, bilateral pleural effusions, enlarged mediastinal lymph nodes and lung nodules  2. Respiratory failure secondary to a large pericardial effusion, right middle lobe collapse, and bilateral pleural effusions   Status post a pericardial window procedure 04/21/2016  3.  History of bilateral vocal cord paralysis, status post a tracheostomy and bilateral injection laryngoplasty at [facility] on 10/27/2015  4.  Elevated liver enzymes-likely secondary to cardiac tamponade, less likely toxicity from pembrolizumab  5.  Fever/cough-most likely secondary to the pericardial effusion and tumor burden, now maintained on vancomycin and cefepime   I saw Dr.Plant is morning prior to surgery and again this afternoon. I reviewed the CT images. 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 He asked me to contact his wife. I will call her.  We will discuss Hospice care versus a trial of salvage chemotherapy if a malignant pericardial effusion is confirmed.  I appreciate the care from Dr. Roxan Hockey.  Recommendations:  1. postoperative care per Dr. Roxan Hockey  2. Follow-up liver enzymes  3. I will follow-up on the pathology and see Dr. Verline Lema on 04/24/2016, please call oncology over the weekend as needed    LOS: 1 day   Betsy Coder, MD   04/21/2016, 3:49 PM

## 2016-04-21 NOTE — H&P (View-Only) (Signed)
Reason for Consult:Pericardial effusion Referring Physician: Dr. Danton Osborne Joel Osborne is an 50 y.o. male.  HPI: 50 yo male with known metastatic adenocarcinoma who presents with a cc/o cough.  Joel Osborne is a 50 y.o. male with a past medical history significant for adenocarcinoma of the distal esophagus/gastric cardia, diagnosed in 2014. He had an esophagectomy by Joel Osborne. In 2016 he was treated with 6 cycles of FOLFOX. 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 He presented to the clinic today for follow-up visit. Over the past week he has had progressively worsening cough associated with clear/yellow sputum production, subjective fevers, chills, generalized weakness, malaise, fatigue, poor tolerance to physical exertion. He had a chest x-ray performed on 04/18/2016 that showed bilateral lower lobe airspace opacities consistent with pneumonia and an enlarged cardiac silouette. He was started on Levaquin but did not improve.  He was admitted today and a CT chest showed a large pericardial effusion. There was also progression of his metastatic disease. He has some chest pain he attributes to frequent coughing. He says he feels better than he did on admission.  Past Medical History  Diagnosis Date  . Hyperlipemia   . Colon polyp   . Pre-diabetes   . GERD (gastroesophageal reflux disease)   . History of radiation therapy 04/07/13-05-15-13    Esopageal ca,50.4Gy/8f and [facility]  . Sleep apnea     no c-pap at this time  . Anxiety   . Diabetes mellitus without complication ([name]     patient denies.  . Esophageal cancer ([facility] 03/18/2013    Adenocarcinoma  . Diverticulosis     Past Surgical History  Procedure Laterality Date  . Appendectomy    . Lipoma excision    . Edg  03/18/2013    Esophageal Mass  . Complete esophagectomy N/A 07/07/2013    Procedure: ESOPHAGECTOMY COMPLETE;  Surgeon: Joel Osborne;  [facility]  Service: Thoracic;  Laterality: N/A;  transhiatel esophagectomy, feeding jejunostomy  . Video bronchoscopy N/A 07/07/2013    Procedure: VIDEO BRONCHOSCOPY;  Surgeon: Joel Osborne;  [facility]  Service: Thoracic;  Laterality: N/A;  . Eus N/A 06/25/2014    Procedure: UPPER ENDOSCOPIC ULTRASOUND (EUS) LINEAR;  Surgeon: Joel Osborne;  [facility];  Service: Endoscopy;  Laterality: N/A;  . Esophagogastroduodenoscopy N/A 07/02/2014    Procedure: ESOPHAGOGASTRODUODENOSCOPY (EGD);  Surgeon: Joel Osborne;  [facility];  Service: Endoscopy;  Laterality: N/A;  . Vocal chord surgery      pushed right vocal cord more centered- September 2015  . Esophagogastroduodenoscopy N/A 02/16/2015    Procedure: ESOPHAGOGASTRODUODENOSCOPY (EGD);  Surgeon: Joel Osborne;  [facility];  Service: Endoscopy;  Laterality: N/A;  . Upper gastrointestinal endoscopy    . Colonoscopy    . Esophagogastroduodenoscopy N/A 09/29/2015    Procedure: ESOPHAGOGASTRODUODENOSCOPY (EGD);  Surgeon: Joel Osborne;  [facility];  Service: Endoscopy;  Laterality: N/A;    Family History  Problem Relation Age of Onset  . Colon cancer Mother     dx in her 597s . Diabetes Mother   . Breast cancer Maternal Aunt 81  . Diabetes Maternal Aunt   . Esophageal cancer Maternal Aunt 78  . Kidney failure Brother 482 . Stomach cancer Neg Hx   . Rectal cancer Neg Hx   . Hypertension Brother 456  Social History:  reports that he has never smoked. He has never used smokeless tobacco. He reports that he drinks about 1.2 oz of alcohol per week. He reports that he does not use illicit drugs.  Allergies:  Allergies  Allergen Reactions  . Lactose Intolerance (Gi) Other (See Comments)    Upset stomach     Medications:  Prior to Admission:  Prescriptions prior to admission  Medication Sig Dispense Refill Last Dose  . acetaminophen  (TYLENOL) 500 MG tablet Take 1,000 mg by mouth every 6 (six) hours as needed for mild pain or moderate pain.   Unknown  . anti-nausea (EMETROL) solution Take 10 mLs by mouth every 15 (fifteen) minutes as needed for nausea or vomiting.   04/20/2016 at Unknown time  . brompheniramine-pseudoephedrine (DIMETAPP) 1-15 MG/5ML ELIX Take by mouth 2 (two) times daily as needed for allergies.   Past Week at Unknown time  . eszopiclone (LUNESTA) 1 MG TABS tablet TAKE 1-2 TABLETS BY MOUTH AT BEDTIME AS NEEDED FOR SLEEP 30 tablet 0 04/19/2016 at Unknown time  . guaiFENesin (ROBITUSSIN) 100 MG/5ML liquid Take 200 mg by mouth 3 (three) times daily as needed for cough.   Past Week at Unknown time  . ibuprofen (ADVIL,MOTRIN) 200 MG tablet Take 400 mg by mouth every 6 (six) hours as needed for moderate pain (soreness).   Past Week at Unknown time  . lactase (LACTAID) 3000 UNITS tablet Take 3,000 Units by mouth daily as needed (lactose intolerance). For lactose intolerance   Past Week at Unknown time  . levofloxacin (LEVAQUIN) 25 MG/ML solution Take 20 mLs (500 mg total) by mouth daily. 140 mL 0 04/19/2016 at Unknown time  . lidocaine-prilocaine (EMLA) cream Apply 1 application topically as needed. Apply tablespoon to PAC 2 hours prior to stick and cover with plastic wrap 30 g 5 04/07/2016  . mirtazapine (REMERON) 15 MG tablet TAKE ONE TABLET BY MOUTH AT BEDTIME 30 tablet 1 04/19/2016 at Unknown time  . Multiple Vitamin (MULTIVITAMIN WITH MINERALS) TABS tablet Take 1 tablet by mouth daily.   Past Week at Unknown time  . dextromethorphan-guaiFENesin (MUCINEX DM) 30-600 MG 12hr tablet Take 1 tablet by mouth 2 (two) times daily. Reported on 04/20/2016   Not Taking  . OVER THE COUNTER MEDICATION Take 28 mg by mouth 2 (two) times daily. Hemp Extract   Taking    Results for orders placed or performed during the hospital encounter of 04/20/16 (from the past 48 hour(s))  Culture, blood (routine x 2) Call Osborne if unable to obtain prior  to antibiotics being given     Status: None (Preliminary result)   Collection Time: 04/20/16  4:37 PM  Result Value Ref Range   Specimen Description BLOOD LEFT ANTECUBITAL    Special Requests BOTTLES DRAWN AEROBIC AND ANAEROBIC 5CC    Culture PENDING    Report Status PENDING   Culture, blood (routine x 2) Call Osborne if unable to obtain prior to antibiotics being given     Status: None (Preliminary result)   Collection Time: 04/20/16  4:37 PM  Result Value Ref Range   Specimen Description BLOOD RIGHT ARM    Special Requests BOTTLES DRAWN AEROBIC AND ANAEROBIC 10CC    Culture PENDING    Report Status PENDING   CBC     Status: Abnormal   Collection Time: 04/20/16  4:40 PM  Result Value Ref Range   WBC 13.5 (H) 4.0 - 10.5 K/uL   RBC 4.45 4.22 - 5.81 MIL/uL  Hemoglobin 12.7 (L) 13.0 - 17.0 g/dL   HCT 38.2 (L) 39.0 - 52.0 %   MCV 85.8 78.0 - 100.0 fL   MCH 28.5 26.0 - 34.0 pg   MCHC 33.2 30.0 - 36.0 g/dL   RDW 13.8 11.5 - 15.5 %   Platelets 297 150 - 400 K/uL  Comprehensive metabolic panel     Status: Abnormal   Collection Time: 04/20/16  4:40 PM  Result Value Ref Range   Sodium 137 135 - 145 mmol/L   Potassium 5.0 3.5 - 5.1 mmol/L   Chloride 99 (L) 101 - 111 mmol/L   CO2 23 22 - 32 mmol/L   Glucose, Bld 184 (H) 65 - 99 mg/dL   BUN 31 (H) 6 - 20 mg/dL   Creatinine, Ser 1.25 (H) 0.61 - 1.24 mg/dL   Calcium 8.9 8.9 - 10.3 mg/dL   Total Protein 7.1 6.5 - 8.1 g/dL   Albumin 3.5 3.5 - 5.0 g/dL   AST 898 (H) 15 - 41 U/L   ALT 918 (H) 17 - 63 U/L   Alkaline Phosphatase 145 (H) 38 - 126 U/L   Total Bilirubin 2.1 (H) 0.3 - 1.2 mg/dL   GFR calc non Af Amer >60 >60 mL/min   GFR calc Af Amer >60 >60 mL/min    Comment: (NOTE) The eGFR has been calculated using the CKD EPI equation. This calculation has not been validated in all clinical situations. eGFR's persistently <60 mL/min signify possible Chronic Kidney Disease.    Anion gap 15 5 - 15  Lactic acid, plasma     Status: Abnormal    Collection Time: 04/20/16  4:40 PM  Result Value Ref Range   Lactic Acid, Venous 3.7 (HH) 0.5 - 2.0 mmol/L    Comment: CRITICAL RESULT CALLED TO, READ BACK BY AND VERIFIED WITH: Kirtland Bouchard [id number] @ 1727 BY J SCOTTON   Differential     Status: Abnormal   Collection Time: 04/20/16  4:40 PM  Result Value Ref Range   Neutrophils Relative % 81 %   Neutro Abs 11.2 (H) 1.7 - 7.7 K/uL   Lymphocytes Relative 8 %   Lymphs Abs 1.0 0.7 - 4.0 K/uL   Monocytes Relative 11 %   Monocytes Absolute 1.5 (H) 0.1 - 1.0 K/uL   Eosinophils Relative 0 %   Eosinophils Absolute 0.0 0.0 - 0.7 K/uL   Basophils Relative 0 %   Basophils Absolute 0.0 0.0 - 0.1 K/uL  Procalcitonin     Status: None   Collection Time: 04/20/16  6:27 PM  Result Value Ref Range   Procalcitonin <0.10 ng/mL    Comment:        Interpretation: PCT (Procalcitonin) <= 0.5 ng/mL: Systemic infection (sepsis) is not likely. Local bacterial infection is possible. (NOTE)         ICU PCT Algorithm               Non ICU PCT Algorithm    ----------------------------     ------------------------------         PCT < 0.25 ng/mL                 PCT < 0.1 ng/mL     Stopping of antibiotics            Stopping of antibiotics       strongly encouraged.               strongly encouraged.    ----------------------------     ------------------------------  PCT level decrease by               PCT < 0.25 ng/mL       >= 80% from peak PCT       OR PCT 0.25 - 0.5 ng/mL          Stopping of antibiotics                                             encouraged.     Stopping of antibiotics           encouraged.    ----------------------------     ------------------------------       PCT level decrease by              PCT >= 0.25 ng/mL       < 80% from peak PCT        AND PCT >= 0.5 ng/mL            Continuin g antibiotics                                              encouraged.       Continuing antibiotics            encouraged.     ----------------------------     ------------------------------     PCT level increase compared          PCT > 0.5 ng/mL         with peak PCT AND          PCT >= 0.5 ng/mL             Escalation of antibiotics                                          strongly encouraged.      Escalation of antibiotics        strongly encouraged.   APTT     Status: None   Collection Time: 04/20/16  6:27 PM  Result Value Ref Range   aPTT 31 24 - 37 seconds  Protime-INR     Status: Abnormal   Collection Time: 04/20/16  6:27 PM  Result Value Ref Range   Prothrombin Time 23.0 (H) 11.6 - 15.2 seconds   INR 2.05 (H) 0.00 - 1.49  Lactic acid, plasma     Status: Abnormal   Collection Time: 04/20/16  8:30 PM  Result Value Ref Range   Lactic Acid, Venous 3.2 (HH) 0.5 - 2.0 mmol/L    Comment: CRITICAL RESULT CALLED TO, READ BACK BY AND VERIFIED WITH: Charleston Poot [id number] @ 2114 BY J SCOTTON   Culture, sputum-assessment     Status: None   Collection Time: 04/20/16  8:32 PM  Result Value Ref Range   Specimen Description SPU    Special Requests NONE    Sputum evaluation THIS SPECIMEN IS ACCEPTABLE FOR SPUTUM CULTURE    Report Status 04/20/2016 FINAL   Urinalysis, Routine w reflex microscopic (not at [facility])     Status: Abnormal   Collection Time: 04/20/16  8:33 PM  Result Value Ref Range   Color,  Urine AMBER (A) YELLOW    Comment: BIOCHEMICALS MAY BE AFFECTED BY COLOR   APPearance CLEAR CLEAR   Specific Gravity, Urine 1.042 (H) 1.005 - 1.030   pH 5.5 5.0 - 8.0   Glucose, UA NEGATIVE NEGATIVE mg/dL   Hgb urine dipstick NEGATIVE NEGATIVE   Bilirubin Urine SMALL (A) NEGATIVE   Ketones, ur NEGATIVE NEGATIVE mg/dL   Protein, ur NEGATIVE NEGATIVE mg/dL   Nitrite NEGATIVE NEGATIVE   Leukocytes, UA NEGATIVE NEGATIVE    Comment: MICROSCOPIC NOT DONE ON URINES WITH NEGATIVE PROTEIN, BLOOD, LEUKOCYTES, NITRITE, OR GLUCOSE <1000 mg/dL.    Ct Abdomen Pelvis Wo Contrast  04/20/2016  CLINICAL DATA:  History of  esophageal carcinoma cough productive fevers chills, elevated liver enzymes EXAM: CT ABDOMEN AND PELVIS WITHOUT CONTRAST TECHNIQUE: Multidetector CT imaging of the abdomen and pelvis was performed following the standard protocol without IV contrast. COMPARISON:  04/20/2016 CT thorax FINDINGS: Small left and moderate right pleural effusion. Large pericardial effusion partially visualized. Gastric pull-up noted consistent with history of esophageal carcinoma. Partially visualized right lower lobe consolidation. Liver appears normal, as does the pancreas and as does spleen given limited evaluation without contrast. There are several small gallstones. No adrenal masses. No significant renal abnormalities. 1 cm cyst upper pole left kidney and 4 mm cyst lower pole right kidney. No hydronephrosis. IV contrast concentrated within the dependent portion of the bladder. Nonobstructive bowel gas pattern.  Small and large bowel normal. Reproductive organs normal. No significant free fluid. No significant vascular abnormalities. No significant adenopathy. No acute musculoskeletal findings. IMPRESSION: Partially visualized large pericardial effusion. Right lower lobe consolidation. Bilateral pleural effusions. Cholelithiasis. Electronically Signed   By: Skipper Cliche M.D.   On: 04/20/2016 19:49   Ct Chest W Contrast  04/20/2016  CLINICAL DATA:  Healthcare associated pneumonia. EXAM: CT CHEST WITH CONTRAST TECHNIQUE: Multidetector CT imaging of the chest was performed during intravenous contrast administration. CONTRAST:  41m ISOVUE-300 IOPAMIDOL (ISOVUE-300) INJECTION 61% COMPARISON:  02/07/2016 FINDINGS: THORACIC INLET/BODY WALL: Porta catheter on the right with tip in good position at the upper cavoatrial junction. There is tracheostomy tube which is well seated. No thoracic inlet adenopathy. MEDIASTINUM: Normal heart size. New and massive pericardial effusion measuring up to 4 cm in thickness. There is smooth mild serosal  enhancement. Pleural fluid has developed fairly rapidly, new since 02/07/2016. Changes of gastric pull-through. There is ill-defined adenopathy in the right hilum with right middle lobe bronchus obstruction and narrowing of right lower lobe pulmonary artery. This has a malignant appearance. Hilar adenopathy on the left is milder. Isolated index precarinal node has heterogeneous low-density appearance. This node measures 10 mm short axis compared 8 mm previously. LUNG WINDOWS: Septal thickening on the right which is likely from lymphatic obstruction. This appears fairly smooth there is no definitive parenchymal lymphangitic tumor. Nearly collapsed right middle lobe. Lobulated pulmonary metastases have enlarged, left lower lobe 5:81 measuring up to 23 mm compared to 19 mm previously. A lingular lobulated metastasis measures 14 mm compared to 11 previously. Small layering pleural effusions. UPPER ABDOMEN: Cholelithiasis. There is mild haziness of fat around the gallbladder but no over distention or definitive cholecystitis. Left diaphragmatic hernia containing colon and pancreatic tail. OSSEOUS: No acute fracture.  No suspicious lytic or blastic lesions. These results were called by telephone at the time of interpretation on 04/20/2016 at 7:37 pm to Dr. KBaltazar Najjarwho verbally acknowledged these results. IMPRESSION: 1. Massive low-density pericardial effusion that has developed since 02/07/2016. No pericardial mass noted.  2. 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 3. Right middle lobe airway obstruction and collapse due to right hilar adenopathy. Right lung edema attributed to malignant lymphatic obstruction. Electronically Signed   By: Monte Fantasia M.D.   On: 04/20/2016 19:38    Review of Systems  Constitutional: Positive for fever, weight loss and malaise/fatigue.  Respiratory: Positive for cough and shortness of breath.        Trach  Cardiovascular: Positive for chest pain  (mild, pleuritic) and leg swelling (minimal).   Blood pressure 118/82, pulse 117, temperature 98.4 F (36.9 C), temperature source Oral, resp. rate 18, height 6' (1.829 m), weight 184 lb 14.4 oz (83.87 kg), SpO2 94 %. Physical Exam  Vitals reviewed. Constitutional: He is oriented to person, place, and time. He appears distressed (mild).  HENT:  Head: Normocephalic and atraumatic.  Eyes: Conjunctivae and EOM are normal. No scleral icterus.  Neck: Neck supple. JVD present. No tracheal deviation present.  Tracheostomy in place  Cardiovascular: Normal heart sounds and intact distal pulses.  Exam reveals no gallop.   No murmur heard. tachycardic at 117  Respiratory: Effort normal. He has no wheezes. He has no rales.  GI: Soft. He exhibits no distension. There is no tenderness.  Musculoskeletal: He exhibits edema (trace LE edema).  Neurological: He is alert and oriented to person, place, and time. No cranial nerve deficit.  Skin: Skin is warm and dry.    Assessment/Plan: Joel Osborne is a 50 yo man with metastatic adenocarcinoma of the esophagus. He presents with a cough, subjective fever, weakness and malaise. He has been found to have a large pericardial effusion with echocardiographic evidence of early tamponade. He needs a pericardial window to drain the effusion.  I discussed the general nature of the procedure, the need for general anesthesia, and the incision to be used with Joel Osborne and his family. I discussed the expected hospital stay, overall recovery and short and long term outcomes. I reviewed the indications, risks, benefits and alternatives. They understand the risks include, but are not limited to death, stroke, MI, DVT/PE, bleeding, possible need for transfusion, infections, other organ system dysfunction including respiratory, renal, or GI complications.   He accepts the risks and agrees to proceed.  OR has been notified  Unfortunately there was a significant delay in  contacting me. I received multiple pages to incomplete numbers. I checked with the answering service and no calls were made to them. I was available at all times. I was called at home around 10PM and requested immediate transfer to [facility], but patient refused until now.  Melrose Nakayama 04/20/2016, 11:25 PM

## 2016-04-21 NOTE — Progress Notes (Signed)
Nutrition Brief Note  Patient identified on the Malnutrition Screening Tool (MST) Report. Weight loss is insignificant.   Wt Readings from Last 15 Encounters:  04/21/16 192 lb 0.3 oz (87.1 kg)  04/20/16 184 lb 14.4 oz (83.87 kg)  04/18/16 185 lb 8 oz (84.142 kg)  04/07/16 186 lb (84.369 kg)  03/17/16 196 lb (88.905 kg)  02/25/16 197 lb 12.8 oz (89.721 kg)  02/08/16 202 lb 11.2 oz (91.944 kg)  02/03/16 204 lb 1.6 oz (92.579 kg)  01/06/16 202 lb 14.4 oz (92.035 kg)  11/23/15 197 lb 11.2 oz (89.676 kg)  11/09/15 196 lb 9.6 oz (89.177 kg)  10/12/15 200 lb 8 oz (90.946 kg)  10/06/15 206 lb (93.441 kg)  09/28/15 206 lb (93.441 kg)  09/14/15 204 lb 12.8 oz (92.897 kg)    Body mass index is 26.04 kg/(m^2). Patient meets criteria for overweight based on current BMI.   Diet just advanced clear liquids, S/P subxyphoid pericardial window this AM. Labs and medications reviewed.   No nutrition interventions warranted at this time. If nutrition issues arise, please consult RD.   Molli Barrows, RD, LDN, [place] Pager [phone] After Hours Pager [phone]

## 2016-04-21 NOTE — Anesthesia Postprocedure Evaluation (Signed)
Anesthesia Post Note  Patient: Joel Osborne  Procedure(s) Performed: Procedure(s) (LRB): SUBXYPHOID PERICARDIAL WINDOW (N/A)  Patient location during evaluation: PACU Anesthesia Type: General Level of consciousness: sedated Pain management: pain level controlled Vital Signs Assessment: post-procedure vital signs reviewed and stable Respiratory status: spontaneous breathing and respiratory function stable Cardiovascular status: stable Anesthetic complications: no    Last Vitals:  Filed Vitals:   04/21/16 0922 04/21/16 0930  BP: 111/83   Pulse:    Temp:    Resp: 8 33    Last Pain:  Filed Vitals:   04/21/16 1000  PainSc: 5                  Analy Bassford DANIEL

## 2016-04-21 NOTE — Transfer of Care (Signed)
Immediate Anesthesia Transfer of Care Note  Patient: Joel Osborne  Procedure(s) Performed: Procedure(s): SUBXYPHOID PERICARDIAL WINDOW (N/A)  Patient Location: PACU  Anesthesia Type:General  Level of Consciousness: awake, alert  and oriented  Airway & Oxygen Therapy: Patient Spontanous Breathing and Patient connected to tracheostomy mask oxygen  Post-op Assessment: Report given to RN and Post -op Vital signs reviewed and stable  Post vital signs: Reviewed and stable  Last Vitals:  Filed Vitals:   04/21/16 0611 04/21/16 0908  BP: 122/90 112/86  Pulse: 110 99  Temp: 37.6 C 36.8 C  Resp: 24 14    Last Pain:  Filed Vitals:   04/21/16 0914  PainSc: 0-No pain      Patients Stated Pain Goal: 4 (15/94/58 5929)  Complications: No apparent anesthesia complications

## 2016-04-21 NOTE — Progress Notes (Signed)
Patient ID: Joel Osborne, male   DOB: [birth year], 50 y.o.   MRN: [id number]  PROGRESS NOTE    Joel Osborne  WOE:[id number] DOB: [birth year] DOA: 04/20/2016  PCP: [name], MD  Oncology: Dr. Benay Spice   Brief Narrative:  50 y.o. male with a past medical history significant for adenocarcinoma of the distal esophagus/gastric cardia, diagnosed in 2014, s/p esophagectomy by Dr. Servando Snare. 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   Patient presented with progressively worsening cough associated with clear/yellow sputum production, subjective fevers, chills, generalized weakness, malaise, fatigue, poor tolerance to physical exertion. Chest x-ray performed on 04/18/2016 showed bilateral lower lobe airspace opacities consistent with pneumonia and an enlarged cardiac silouette. He was started on Levaquin but did not improve. CT chest subsequently showed a large pericardial effusion and progression of his metastatic disease. ECHO confirmed large pleural effusion and findings consistent with tamponade physiology.   Assessment & Plan:   Principal Problems:   Acute respiratory failure with hypoxia (HCC) / HCAP (healthcare-associated pneumonia) / Right middle lobe airway obstruction and collapse due to right hilar adenopathy / Right lung edema secondary to malignant lymphatic obstruction - Stable resp status - Has trach and chest tube   Pericardial effusion with cardiac tamponade - S/P pericardial window today by Dr. Roxan Hockey - No subsequent complications - Monitor in SDU - Continue supportive care, analgesia as needed  Active Problems:   Sepsis due to pneumonia ([facility]) / Leukocytosis - Sepsis criteria met with fever, tachycardia, tachypnea, hypoxia (O2 saturation 92% on Eros oxygen) support), leukocytosis, lactic acidosis - Presumed source of infections HCAP -  Continue vanco and cefepime  - Blood cultures negative so far - HIV non reactive  - Strep pneumonia ag is negative  - Resp culture with final reprot still pending, multiple GNR, GPR GPC so far on prelim study    Cancer of distal third of esophagus (HCC) - Appreciate Dr. Benay Spice following - Last chemo 04/07/2016    Anemia of chronic disease - Due to sequela of chemotherapy  - Hgb is 11.8, stable     Moderate protein calorie malnutrition - In the context of chronic illness - Seen by nutritionist     DVT prophylaxis: Lovenox subQ Code Status: full code  Family Communication: family not at the bedside Disposition Plan: not yet stable for discharge, just had pericardial window, needs close monitoring in SDU.   Consultants:   Hematology, Dr. Betsy Coder   Cardiothoracic surgery, Dr. Roxan Hockey   Procedures:   Pericardial Window 04/21/2016   ECHO 04/20/2016 - EF 60%; Pericardium, extracardiac: A large, free-flowing pericardial effusion was identified circumferential to the heart. The fluid had no internal echoes. There was moderateright ventricular chamber collapse for more than 50% of the cardiac cycle. There was evidence for moderately increased RV-LV interaction demonstrated by respirophasic changes in transmitral velocities. Features were consistent with tamponade physiology.   Antimicrobials:   Vanco 04/20/2016 -->  Cefepime 04/20/2016 -->   Subjective: Feels okay, came back from OR, stable.   Objective: Filed Vitals:   04/21/16 1030 04/21/16 1037 04/21/16 1045 04/21/16 1151  BP:  95/72    Pulse: 104 98 100 103  Temp: 97.3 F (36.3 C)   98.3 F (36.8 C)  TempSrc:    Oral  Resp: _0 Height:      Weight:      SpO2: 100% 99%  98% 95%    Intake/Output Summary (Last 24 hours) at 04/21/16 1353 Last data filed at 04/21/16 1050  Gross per 24 hour  Intake 1937.5 ml  Output   2065 ml  Net -127.5 ml   Filed Weights   04/20/16 1558 04/21/16 0050    Weight: 83.87 kg (184 lb 14.4 oz) 87.1 kg (192 lb 0.3 oz)    Examination:  General exam: Appears calm and comfortable  Respiratory system: Diminished breath sounds, has trach n place. Cardiovascular system: S1 & S2 heard, Rate controlled. No JVD. (+) chest tube Gastrointestinal system: Abdomen is nondistended, soft and nontender. No organomegaly or masses felt. Normal bowel sounds heard. Central nervous system: Alert and oriented. No focal neurological deficits. Extremities: Symmetric 5 x 5 power. Skin: No rashes, lesions or ulcers Psychiatry: Judgement and insight appear normal. Mood & affect appropriate.   Data Reviewed: I have personally reviewed following labs and imaging studies  CBC:  Recent Labs Lab 04/20/16 1640 04/21/16 0440  WBC 13.5* 15.4*  NEUTROABS 11.2* 12.5*  HGB 12.7* 11.8*  HCT 38.2* 37.3*  MCV 85.8 88.4  PLT 297 027   Basic Metabolic Panel:  Recent Labs Lab 04/20/16 1640 04/21/16 0440  NA 137 139  K 5.0 4.7  CL 99* 104  CO2 23 22  GLUCOSE 184* 158*  BUN 31* 28*  CREATININE 1.25* 1.07  CALCIUM 8.9 8.5*   GFR: Estimated Creatinine Clearance: 89.6 mL/min (by C-G formula based on Cr of 1.07). Liver Function Tests:  Recent Labs Lab 04/20/16 1640 04/21/16 0440  AST 898* 2014*  ALT 918* 1872*  ALKPHOS 145* 126  BILITOT 2.1* 1.8*  PROT 7.1 6.2*  ALBUMIN 3.5 2.9*   No results for input(s): LIPASE, AMYLASE in the last 168 hours. No results for input(s): AMMONIA in the last 168 hours. Coagulation Profile:  Recent Labs Lab 04/20/16 1827  INR 2.05*   Cardiac Enzymes: No results for input(s): CKTOTAL, CKMB, CKMBINDEX, TROPONINI in the last 168 hours. BNP (last 3 results) No results for input(s): PROBNP in the last 8760 hours. HbA1C: No results for input(s): HGBA1C in the last 72 hours. CBG:  Recent Labs Lab 04/21/16 0549 04/21/16 0911 04/21/16 1138  GLUCAP 142* 149* 109*   Lipid Profile: No results for input(s): CHOL, HDL,  LDLCALC, TRIG, CHOLHDL, LDLDIRECT in the last 72 hours. Thyroid Function Tests: No results for input(s): TSH, T4TOTAL, FREET4, T3FREE, THYROIDAB in the last 72 hours. Anemia Panel: No results for input(s): VITAMINB12, FOLATE, FERRITIN, TIBC, IRON, RETICCTPCT in the last 72 hours. Urine analysis:    Component Value Date/Time   COLORURINE AMBER* 04/20/2016 2033   APPEARANCEUR CLEAR 04/20/2016 2033   LABSPEC 1.042* 04/20/2016 2033   PHURINE 5.5 04/20/2016 2033   GLUCOSEU NEGATIVE 04/20/2016 2033   HGBUR NEGATIVE 04/20/2016 2033   BILIRUBINUR SMALL* 04/20/2016 2033   KETONESUR NEGATIVE 04/20/2016 2033   PROTEINUR NEGATIVE 04/20/2016 2033   UROBILINOGEN 0.2 07/03/2013 1000   NITRITE NEGATIVE 04/20/2016 2033   LEUKOCYTESUR NEGATIVE 04/20/2016 2033   Sepsis Labs: _0 (procalcitonin:4,lacticidven:4)   Culture, blood (routine x 2) Call MD if unable to obtain prior to antibiotics being given     Status: None (Preliminary result)   Collection Time: 04/20/16  4:37 PM  Result Value Ref Range Status   Specimen Description BLOOD LEFT ANTECUBITAL  Final   Special Requests BOTTLES DRAWN AEROBIC AND ANAEROBIC 5CC  Final   Culture PENDING  Incomplete   Report Status PENDING  Incomplete  Culture, blood (routine x  2) Call MD if unable to obtain prior to antibiotics being given     Status: None (Preliminary result)   Collection Time: 04/20/16  4:37 PM  Result Value Ref Range Status   Specimen Description BLOOD RIGHT ARM  Final   Special Requests BOTTLES DRAWN AEROBIC AND ANAEROBIC 10CC  Final   Culture PENDING  Incomplete   Report Status PENDING  Incomplete  Culture, sputum-assessment     Status: None   Collection Time: 04/20/16  8:32 PM  Result Value Ref Range Status   Specimen Description SPU  Final   Special Requests NONE  Final   Sputum evaluation THIS SPECIMEN IS ACCEPTABLE FOR SPUTUM CULTURE  Final   Report Status 04/20/2016 FINAL  Final  Culture, respiratory (NON-Expectorated)      Status: None (Preliminary result)   Collection Time: 04/20/16  8:32 PM  Result Value Ref Range Status   Specimen Description SPUTUM  Final   Special Requests NONE  Final   Gram Stain   Final    MODERATE WBC PRESENT, PREDOMINANTLY PMN FEW SQUAMOUS EPITHELIAL CELLS PRESENT FEW GRAM NEGATIVE RODS FEW GRAM POSITIVE RODS FEW GRAM POSITIVE COCCI IN PAIRS IN CHAINS IN CLUSTERS THIS SPECIMEN IS ACCEPTABLE FOR SPUTUM CULTURE Performed at [facility]    Culture PENDING  Incomplete   Report Status PENDING  Incomplete  Surgical PCR screen     Status: Abnormal   Collection Time: 04/21/16  1:00 AM  Result Value Ref Range Status   MRSA, PCR NEGATIVE NEGATIVE Final   Staphylococcus aureus POSITIVE (A) NEGATIVE Final  Gram stain     Status: None   Collection Time: 04/21/16  8:21 AM  Result Value Ref Range Status   Specimen Description FLUID PERICARDIAL  Final   Special Requests NONE  Final   Gram Stain   Final    MODERATE WBC PRESENT,BOTH PMN AND MONONUCLEAR NO ORGANISMS SEEN    Report Status 04/21/2016 FINAL  Final      Radiology Studies: Ct Abdomen Pelvis Wo Contrast 04/20/2016   Partially visualized large pericardial effusion. Right lower lobe consolidation. Bilateral pleural effusions. Cholelithiasis.  Dg Chest 2 View 04/18/2016   Bilateral lower lobe airspace opacities may reflect pneumonia. Mild cardiomegaly. Electronically Signed   By: Rolm Baptise M.D.   On: 04/18/2016 13:07   Ct Chest W Contrast 04/20/2016 1. Massive low-density pericardial effusion that has developed since 02/07/2016. No pericardial mass noted. 2. 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 3. Right middle lobe airway obstruction and collapse due to right hilar adenopathy. Right lung edema attributed to malignant lymphatic obstruction.   Dg Chest Port 1 View 04/21/2016  Mild perihilar edema with small left pleural effusion.    Scheduled Meds: . acetaminophen  1,000 mg  Oral Q6H   Or  . acetaminophen (TYLENOL) oral liquid 160 mg/5 mL  1,000 mg Oral Q6H  . bisacodyl  10 mg Oral Daily  . ceFEPime (MAXIPIME) IV  1 g Intravenous Q8H  . enoxaparin (LOVENOX) injection  40 mg Subcutaneous Q24H  . HYDROmorphone      . mirtazapine  15 mg Oral QHS  . mupirocin ointment  1 application Nasal BID  . senna-docusate  1 tablet Oral QHS  . sodium chloride flush  10-40 mL Intracatheter Q12H  . vancomycin  1,000 mg Intravenous Q12H   Continuous Infusions: . 0.45 % NaCl with KCl 20 mEq / L       LOS: 1 day    Time  spent: 25 minutes  Greater than 50% of the time spent on counseling and coordinating the care.   Leisa Lenz, MD [facility] Pager [phone]  If 7PM-7AM, please contact night-coverage [email or web address] Password [email or web address] 04/21/2016, 1:53 PM

## 2016-04-21 NOTE — Anesthesia Procedure Notes (Signed)
Procedure Name: Intubation Date/Time: 04/21/2016 8:04 AM Performed by: [place] Pink Pre-anesthesia Checklist: Patient identified, Emergency Drugs available, Suction available, Patient being monitored and Timeout performed Patient Re-evaluated:Patient Re-evaluated prior to inductionOxygen Delivery Method: Circle system utilized Preoxygenation: Pre-oxygenation with 100% oxygen Intubation Type: IV induction Tube type: Reinforced Tube size: 7.0 mm Placement Confirmation: positive ETCO2 and breath sounds checked- equal and bilateral Tube secured with: Tape Dental Injury: Teeth and Oropharynx as per pre-operative assessment  Comments: Patients trach removed by Tamela Gammon, MD and replaced with reinforced oral endotracheal tube. Cuff of tracheal tube inflated, positive pressure ventilation with end tidal CO2 and bilateral breath sounds.

## 2016-04-21 NOTE — Interval H&P Note (Signed)
History and Physical Interval Note:  04/21/2016 6:48 AM  Joel Osborne  has presented today for surgery, with the diagnosis of fluid around heart  The various methods of treatment have been discussed with the patient and family. After consideration of risks, benefits and other options for treatment, the patient has consented to  Procedure(s): SUBXYPHOID PERICARDIAL WINDOW (N/A) as a surgical intervention .  The patient's history has been reviewed, patient examined, no change in status, stable for surgery.  I have reviewed the patient's chart and labs.  Questions were answered to the patient's satisfaction.     Melrose Nakayama

## 2016-04-21 NOTE — Brief Op Note (Signed)
04/20/2016 - 04/21/2016  8:44 AM  PATIENT:  Elvera Bicker  50 y.o. male  PRE-OPERATIVE DIAGNOSIS: Pericardial effusion  POST-OPERATIVE DIAGNOSIS:  Pericardial effusion  PROCEDURE:  Procedure(s): SUBXYPHOID PERICARDIAL WINDOW (N/A)  SURGEON:  Surgeon(s) and Role:    * Melrose Nakayama, MD - Primary  ASSISTANTS: Vernie Murders, RNFA  ANESTHESIA:   general  EBL:  Total I/O In: -  Out: 1250 [Other:1200; Blood:50]  BLOOD ADMINISTERED:none  DRAINS: 96 F Blake in pericardium  LOCAL MEDICATIONS USED:  NONE  SPECIMEN:  Source of Specimen:  pericardium and fluid  DISPOSITION OF SPECIMEN:  PATHOLOGY  COUNTS:  YES  PLAN OF CARE: Admit to inpatient   PATIENT DISPOSITION:  PACU - hemodynamically stable.   Delay start of Pharmacological VTE agent (>24hrs) due to surgical blood loss or risk of bleeding: no  FINDINGS: 1 liter of bloody fluid

## 2016-04-21 NOTE — Anesthesia Preprocedure Evaluation (Addendum)
Anesthesia Evaluation  Patient identified by MRN, date of birth, ID band Patient awake  General Assessment Comment:COMPLETE ESOPHAGECTOMY 07/07/2013   Reviewed: Allergy & Precautions, H&P , NPO status , Patient's Chart, lab work & pertinent test results  History of Anesthesia Complications (+) PONV  Airway Mallampati: Trach       Dental no notable dental hx. (+) Teeth Intact   Pulmonary sleep apnea ,  Tracheostomy   breath sounds clear to auscultation       Cardiovascular negative cardio ROS   Rhythm:Regular Rate:Tachycardia  Study Conclusions  - Left ventricle: The cavity size was normal. Systolic function was normal. The estimated ejection fraction was in the range of 60% to 65%. Wall motion was normal; there were no regional wall motion abnormalities. Left ventricular diastolic function parameters were normal. - Pericardium, extracardiac: A large, free-flowing pericardial effusion was identified circumferential to the heart. The fluid had no internal echoes. There was moderateright ventricular chamber collapse for more than 50% of the cardiac cycle. There was evidence for moderately increased RV-LV interaction demonstrated by respirophasic changes in transmitral velocities. Features were consistent with tamponade physiology.    Neuro/Psych PSYCHIATRIC DISORDERS Anxiety negative neurological ROS     GI/Hepatic Neg liver ROS, Esophagectomy   Endo/Other  diabetes  Renal/GU negative Renal ROS  negative genitourinary   Musculoskeletal negative musculoskeletal ROS (+)   Abdominal   Peds negative pediatric ROS (+)  Hematology negative hematology ROS (+)   Anesthesia Other Findings   Reproductive/Obstetrics negative OB ROS                            Anesthesia Physical  Anesthesia Plan  ASA: IV  Anesthesia Plan: General   Post-op Pain Management:    Induction:  Intravenous  Airway Management Planned: Oral ETT  Additional Equipment: Arterial line  Intra-op Plan:   Post-operative Plan: Possible Post-op intubation/ventilation  Informed Consent: I have reviewed the patients History and Physical, chart, labs and discussed the procedure including the risks, benefits and alternatives for the proposed anesthesia with the patient or authorized representative who has indicated his/her understanding and acceptance.     Plan Discussed with: CRNA, Anesthesiologist and Surgeon  Anesthesia Plan Comments: (Possible TEE, will carefully attempt.  Right IJ portacath tunnelled to Right chest, left IJ in previous esophageal surgical site.  Will proceed with PIV only.)       Anesthesia Quick Evaluation

## 2016-04-21 NOTE — Progress Notes (Signed)
Patient returned from surgery s/p POD#0 subxyphoid pericardial window. Patient alert and oriented x4. Zeroed arterial line, surgical dressing CDI, and patient SaO2  >93% on trach collar.

## 2016-04-22 ENCOUNTER — Inpatient Hospital Stay (HOSPITAL_COMMUNITY): Payer: BLUE CROSS/BLUE SHIELD

## 2016-04-22 LAB — BASIC METABOLIC PANEL
Anion gap: 8 (ref 5–15)
BUN: 19 mg/dL (ref 6–20)
CALCIUM: 7.6 mg/dL — AB (ref 8.9–10.3)
CO2: 23 mmol/L (ref 22–32)
CREATININE: 0.71 mg/dL (ref 0.61–1.24)
Chloride: 107 mmol/L (ref 101–111)
GFR calc Af Amer: >60 mL/min (ref >60)
Glucose, Bld: 88 mg/dL (ref 65–99)
Potassium: 3.6 mmol/L (ref 3.5–5.1)
SODIUM: 138 mmol/L (ref 135–145)

## 2016-04-22 LAB — CBC
HCT: 33.2 % — ABNORMAL LOW (ref 39.0–52.0)
Hemoglobin: 10.4 g/dL — ABNORMAL LOW (ref 13.0–17.0)
MCH: 27.9 pg (ref 26.0–34.0)
MCHC: 31.3 g/dL (ref 30.0–36.0)
MCV: 89 fL (ref 78.0–100.0)
PLATELETS: 153 10*3/uL (ref 150–400)
RBC: 3.73 MIL/uL — ABNORMAL LOW (ref 4.22–5.81)
RDW: 13.9 % (ref 11.5–15.5)
WBC: 7.6 10*3/uL (ref 4.0–10.5)

## 2016-04-22 LAB — HEPATIC FUNCTION PANEL
ALK PHOS: 90 U/L (ref 38–126)
ALT: 1035 U/L — ABNORMAL HIGH (ref 17–63)
AST: 525 U/L — ABNORMAL HIGH (ref 15–41)
Albumin: 2.1 g/dL — ABNORMAL LOW (ref 3.5–5.0)
BILIRUBIN INDIRECT: 0.6 mg/dL (ref 0.3–0.9)
Bilirubin, Direct: 0.4 mg/dL (ref 0.1–0.5)
TOTAL PROTEIN: 4.7 g/dL — AB (ref 6.5–8.1)
Total Bilirubin: 1 mg/dL (ref 0.3–1.2)

## 2016-04-22 MED ORDER — SODIUM CHLORIDE 0.9 % IV BOLUS (SEPSIS)
500.0000 mL | Freq: Once | INTRAVENOUS | Status: AC
  Administered 2016-04-22: 500 mL via INTRAVENOUS

## 2016-04-22 NOTE — Op Note (Signed)
Joel Osborne, Joel Osborne NO.:  [id number]  MEDICAL RECORD NO.:  [id number]  LOCATION:  3S05C                        FACILITY:  [facility]  PHYSICIAN:  Revonda Standard. Roxan Hockey, M.D.DATE OF BIRTH:  1966-10-17  DATE OF PROCEDURE:  04/21/2016 DATE OF DISCHARGE:                              OPERATIVE REPORT   PREOPERATIVE DIAGNOSIS:  Pericardial effusion with early tamponade.  POSTOPERATIVE DIAGNOSIS:  Pericardial effusion with early tamponade.  PROCEDURE:  Subxiphoid pericardial window.  SURGEON:  Revonda Standard. Roxan Hockey, M.D.  ASSISTANT:  Vernie Murders, RNFA  ANESTHESIA:  General.  FINDINGS:  Approximately 1 L of bloody fluid evacuated from pericardium. Improved hemodynamics postdrainage.  CLINICAL NOTE:  Joel Osborne is a 50 year old gentleman with metastatic esophageal cancer.  He presented to [facility] with complaints of cough, shortness of breath, chest discomfort, general malaise and fatigue.  CT of the chest showed a large pericardial effusion and an echocardiogram showed a large circumferential pericardial effusion with early signs of tamponade.  The patient was advised to undergo subxiphoid pericardial window to drain the effusion.  The indications, risks, benefits, and alternatives were discussed in detail with the patient. He understood and accepted the risks and agreed to proceed.  The patient was transferred to [facility].  DESCRIPTION OF PROCEDURE:  On the morning of Apr 21, 2016, Joel Osborne was brought to the preoperative holding area.  Anesthesia established peripheral IV access and placed an arterial blood pressure monitoring line. He was taken to the operating room, anesthetized, and an endotracheal tube was placed via his tracheostomy.  A Foley catheter was placed.  The chest and abdomen were prepped and draped in the usual sterile fashion. Because of the patient's previous history of esophageal cancer, the decision was made not to do  transesophageal echocardiography unless intraoperative findings dictated necessity.  An incision was made centered over the xiphoid process. It was carried through the skin and subcutaneous tissue.  The distal tip of the xiphoid was removed. Traction was applied to the underside of the sternum.  The fat overlying the pericardium was dissected off, the pericardium was identified and incised.  Approximately 1 L of bloody fluid was evacuated.  Part of the fluid was sent for cultures, the remainder was sent for cytology.  A window was created removing a 2 cm2 piece of pericardium.  The sucker was advanced into the pericardium and moved around to all different quadrants to ensure that there were no retained or loculated areas of fluid.  A 28-French Blake drain was placed through a separate stab incision and tunneled into the pericardial space along the diaphragm. It was secured to the skin with a #1 silk suture.  The wound was copiously irrigated with warm saline.  The fascia was closed with a running #1 Vicryl fascial suture, a 2-0 Vicryl running suture was used to close the subcutaneous tissue, and the skin was closed with the 3-0 Vicryl subcuticular suture.  Of note, the patient was tachycardic and had a mild pulsus paradoxus, but was not hypotensive on induction.  After drainage of the fluid, the tachycardia resolved as did the paradoxical pulse.  The patient had his normal tracheostomy tube replaced and was taken  to the postanesthetic care unit in good condition.     Revonda Standard Roxan Hockey, M.D.     SCH/MEDQ  D:  04/21/2016  T:  04/22/2016  Job:  [id number]

## 2016-04-22 NOTE — Progress Notes (Addendum)
      [address]       [place] [address]             [phone]      1 Day Post-Op Procedure(s) (LRB): SUBXYPHOID PERICARDIAL WINDOW (N/A)   Subjective:  Joel Osborne has no complaints.  He is coughing up a lot of sputum.  Objective: Vital signs in last 24 hours: Temp:  [97.3 F (36.3 C)-98.3 F (36.8 C)] 98.3 F (36.8 C) (05/13 0805) Pulse Rate:  [88-104] 88 (05/13 0805) Cardiac Rhythm:  [-] Normal sinus rhythm (05/13 0700) Resp:  [11-30] 18 (05/13 0805) BP: (86-109)/(63-85) 107/85 mmHg (05/13 0335) SpO2:  [91 %-100 %] 93 % (05/13 0805) Arterial Line BP: (99-120)/(54-68) 104/60 mmHg (05/12 1045) FiO2 (%):  [28 %] 28 % (05/13 0805)  Intake/Output from previous day: 05/12 0701 - 05/13 0700 In: 2336.7 [I.V.:1836.7; IV Piggyback:500] Out: 2547 [Urine:975; Blood:50; Chest Tube:322] Intake/Output this shift: Total I/O In: -  Out: 150 [Urine:150]  General appearance: alert, cooperative and no distress Heart: regular rate and rhythm Lungs: diminished breath sounds bilaterally and trach in place  Wound: clean and dry   Lab Results:  Recent Labs  04/21/16 0440 04/22/16 0307  WBC 15.4* 7.6  HGB 11.8* 10.4*  HCT 37.3* 33.2*  PLT 219 153   BMET:  Recent Labs  04/21/16 0440 04/22/16 0307  NA 139 138  K 4.7 3.6  CL 104 107  CO2 22 23  GLUCOSE 158* 88  BUN 28* 19  CREATININE 1.07 0.71  CALCIUM 8.5* 7.6*    PT/INR:  Recent Labs  04/20/16 1827  LABPROT 23.0*  INR 2.05*   ABG    Component Value Date/Time   PHART 7.447 04/21/2016 0550   HCO3 17.7* 04/21/2016 0550   TCO2 18.5 04/21/2016 0550   ACIDBASEDEF 5.6* 04/21/2016 0550   O2SAT 94.9 04/21/2016 0550   CBG (last 3)   Recent Labs  04/21/16 0911 04/21/16 1138 04/21/16 1509  GLUCAP 149* 109* 108*    Assessment/Plan: S/P Procedure(s) (LRB): SUBXYPHOID PERICARDIAL WINDOW (N/A)  1. Chest tube- 370 cc output since placed- leave in place for now 2. Metastaic  Adenocarcinoma of the esophage 3. Pulm- has trach, continue care 4. ID- HCAP, Sepsis continue ABX 5. Dispo- care per primary, leave chest tube today   LOS: 2 days    Ellwood Handler 04/22/2016  Feels better Leave pericardial tube today to monitor output I have seen and examined Elvera Bicker and agree with the above assessment  and plan.  [name] Beeper [phone] Office [phone] 04/22/2016 10:09 AM

## 2016-04-22 NOTE — Procedures (Signed)
Asked by RN to check A-line due to poor wave form.  Successful flush of Aline, however unable to draw back blood sample.  Dressing removed and A-line repositioned. Additionally A-line was disconnected from hub: no blood flow present. A-line redressed, RN made aware that A-line is no long functioning.

## 2016-04-22 NOTE — Progress Notes (Signed)
BP 86/66 with MAP in the 70s. Pt asymptomatic. NP on call made aware and no new orders given at this time. Will continue to monitor

## 2016-04-22 NOTE — Progress Notes (Addendum)
Patient ID: Joel Osborne, male   DOB: 09/22/1966, 50 y.o.   MRN: [id number]  PROGRESS NOTE    Edon Hoadley  JXB:[id number] DOB: October 21, 1966 DOA: 04/20/2016  PCP: [name], MD  Oncology: Dr. Benay Spice   Brief Narrative:  50 y.o. male with a past medical history significant for adenocarcinoma of the distal esophagus/gastric cardia, diagnosed in 2014, s/p esophagectomy by Dr. Servando Snare. 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   Patient presented with progressively worsening cough associated with clear/yellow sputum production, subjective fevers, chills, generalized weakness, malaise, fatigue, poor tolerance to physical exertion. Chest x-ray performed on 04/18/2016 showed bilateral lower lobe airspace opacities consistent with pneumonia and an enlarged cardiac silouette. He was started on Levaquin but did not improve. CT chest subsequently showed a large pericardial effusion and progression of his metastatic disease. ECHO confirmed large pleural effusion and findings consistent with tamponade physiology. Status post subxiphoid pericardial window on 04/21/16.  Assessment & Plan:   Principal Problems:   Acute respiratory failure with hypoxia (HCC) / HCAP (healthcare-associated pneumonia) / Right middle lobe airway obstruction and collapse due to right hilar adenopathy / Right lung edema secondary to malignant lymphatic obstruction - Stable resp status - Has trach and chest tube  - Aggressive pulmonary toilet.    Pericardial effusion with cardiac tamponade - S/P pericardial window on 5/12 - Management per thoracic surgery. Chest tube: 370 mL output-leaving in place for now.  Active Problems:   Sepsis due to pneumonia ([place]) / Leukocytosis - Sepsis criteria met with fever, tachycardia, tachypnea, hypoxia (O2 saturation 92% on [place] oxygen) support), leukocytosis,  lactic acidosis - Presumed source of infections HCAP - Continue vanco and cefepime  - Blood cultures 2: Negative to date.  - HIV non reactive  - Strep pneumonia ag is negative  - Resp culture with final reprot still pending, multiple GNR, GPR GPC so far on prelim study - Sepsis physiology resolved. - If no indication from thoracic surgery, consider deescalating antibiotic spectrum from 5/14 i.e. discontinue vancomycin and change cefepime to oral antibiotics.    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 Benay Spice following - Last chemo 04/07/2016    Anemia of chronic disease - Due to sequela of chemotherapy  - Mild hemoglobin dropped from 11.8 > 10.4. Follow CBCs.    Moderate protein calorie malnutrition - In the context of chronic illness - Seen by nutritionist   History of bilateral vocal cord paralysis, status post tracheostomy and bilateral injection laryngoplasty at [facility] 10/27/15  Abnormal LFTs - May be secondary to cardiac tamponade. As per oncology, less likely due to chemotherapy. Better compared to 5/12. Follow LFTs closely. No GI symptoms reported    DVT prophylaxis: Lovenox subQ Code Status: full code  Family Communication: Discussed with patient. Discussed with patient's spouse, updated care and answered questions. Disposition Plan: not yet stable for discharge, just had pericardial window, needs close monitoring in SDU.   Consultants:   Hematology, Dr. Betsy Coder   Cardiothoracic surgery, Dr. Roxan Hockey   Procedures:   Pericardial Window 04/21/2016   ECHO 04/20/2016 - EF 60%; Pericardium, extracardiac: A large, free-flowing pericardial effusion was identified circumferential to the heart. The fluid had no internal echoes. There was moderateright ventricular chamber collapse for more than 50% of the cardiac cycle. There was evidence for moderately increased RV-LV interaction demonstrated by respirophasic changes in  transmitral  velocities. Features were consistent with tamponade physiology.  Foley catheter   Antimicrobials:   Vanco 04/20/2016 -->  Cefepime 04/20/2016 -->   Subjective: Cough with mild increased production through tracheostomy. Denies dyspnea. No significant pain reported. As per RN, no acute issues. Complaining of discomfort at Foley catheter site-DC at discretion of thoracic surgery.  Objective: Filed Vitals:   04/22/16 0335 04/22/16 0616 04/22/16 0805 04/22/16 1141  BP: 107/85     Pulse: 98 95 88 99  Temp: 97.9 F (36.6 C)  98.3 F (36.8 C) 98.5 F (36.9 C)  TempSrc: Oral  Oral Oral  Resp: '17 17 18 17  '$ Height:      Weight:      SpO2: 97% 98% 93% 96%    Intake/Output Summary (Last 24 hours) at 04/22/16 1414 Last data filed at 04/22/16 1000  Gross per 24 hour  Intake 1876.67 ml  Output   1031 ml  Net 845.67 ml   Filed Weights   04/20/16 1558 04/21/16 0050  Weight: 83.87 kg (184 lb 14.4 oz) 87.1 kg (192 lb 0.3 oz)    Examination:  General exam: Appears calm and comfortable. Lying comfortably propped up in bed.  Respiratory system: Diminished breath sounds in bases. Rest of lung fields clear to auscultation. Has trach in place. Cardiovascular system: S1 & S2 heard, Rate controlled. No JVD. (+) chest tube. Telemetry: Sinus rhythm. Gastrointestinal system: Abdomen is nondistended, soft and nontender. No organomegaly or masses felt. Normal bowel sounds heard. Central nervous system: Alert and oriented. No focal neurological deficits. Extremities: Symmetric 5 x 5 power. Skin: No rashes, lesions or ulcers Psychiatry: Judgement and insight appear normal. Mood & affect appropriate.   Data Reviewed: I have personally reviewed following labs and imaging studies  CBC:  Recent Labs Lab 04/20/16 1640 04/21/16 0440 04/22/16 0307  WBC 13.5* 15.4* 7.6  NEUTROABS 11.2* 12.5*  --   HGB 12.7* 11.8* 10.4*  HCT 38.2* 37.3* 33.2*  MCV 85.8 88.4 89.0  PLT 297 219 915    Basic Metabolic Panel:  Recent Labs Lab 04/20/16 1640 04/21/16 0440 04/22/16 0307  NA 137 139 138  K 5.0 4.7 3.6  CL 99* 104 107  CO2 '23 22 23  '$ GLUCOSE 184* 158* 88  BUN 31* 28* 19  CREATININE 1.25* 1.07 0.71  CALCIUM 8.9 8.5* 7.6*   GFR: Estimated Creatinine Clearance: 119.9 mL/min (by C-G formula based on Cr of 0.71). Liver Function Tests:  Recent Labs Lab 04/20/16 1640 04/21/16 0440 04/22/16 0307  AST 898* 2014* 525*  ALT 918* 1872* 1035*  ALKPHOS 145* 126 90  BILITOT 2.1* 1.8* 1.0  PROT 7.1 6.2* 4.7*  ALBUMIN 3.5 2.9* 2.1*   No results for input(s): LIPASE, AMYLASE in the last 168 hours. No results for input(s): AMMONIA in the last 168 hours. Coagulation Profile:  Recent Labs Lab 04/20/16 1827  INR 2.05*   Cardiac Enzymes: No results for input(s): CKTOTAL, CKMB, CKMBINDEX, TROPONINI in the last 168 hours. BNP (last 3 results) No results for input(s): PROBNP in the last 8760 hours. HbA1C: No results for input(s): HGBA1C in the last 72 hours. CBG:  Recent Labs Lab 04/21/16 0549 04/21/16 0911 04/21/16 1138 04/21/16 1509  GLUCAP 142* 149* 109* 108*   Lipid Profile: No results for input(s): CHOL, HDL, LDLCALC, TRIG, CHOLHDL, LDLDIRECT in the last 72 hours. Thyroid Function Tests: No results for input(s): TSH, T4TOTAL, FREET4, T3FREE, THYROIDAB in the last 72 hours. Anemia Panel: No results for input(s): VITAMINB12, FOLATE, FERRITIN,  TIBC, IRON, RETICCTPCT in the last 72 hours. Urine analysis:    Component Value Date/Time   COLORURINE AMBER* 04/20/2016 2033   APPEARANCEUR CLEAR 04/20/2016 2033   LABSPEC 1.042* 04/20/2016 2033   PHURINE 5.5 04/20/2016 2033   GLUCOSEU NEGATIVE 04/20/2016 2033   HGBUR NEGATIVE 04/20/2016 2033   BILIRUBINUR SMALL* 04/20/2016 2033   [place] NEGATIVE 04/20/2016 2033   PROTEINUR NEGATIVE 04/20/2016 2033   UROBILINOGEN 0.2 07/03/2013 1000   NITRITE NEGATIVE 04/20/2016 2033   LEUKOCYTESUR NEGATIVE 04/20/2016  2033   Sepsis Labs: '@LABRCNTIP'$ (procalcitonin:4,lacticidven:4)   Culture, blood (routine x 2) Call MD if unable to obtain prior to antibiotics being given     Status: None (Preliminary result)   Collection Time: 04/20/16  4:37 PM  Result Value Ref Range Status   Specimen Description BLOOD LEFT ANTECUBITAL  Final   Special Requests BOTTLES DRAWN AEROBIC AND ANAEROBIC 5CC  Final   Culture PENDING  Incomplete   Report Status PENDING  Incomplete  Culture, blood (routine x 2) Call MD if unable to obtain prior to antibiotics being given     Status: None (Preliminary result)   Collection Time: 04/20/16  4:37 PM  Result Value Ref Range Status   Specimen Description BLOOD RIGHT ARM  Final   Special Requests BOTTLES DRAWN AEROBIC AND ANAEROBIC 10CC  Final   Culture PENDING  Incomplete   Report Status PENDING  Incomplete  Culture, sputum-assessment     Status: None   Collection Time: 04/20/16  8:32 PM  Result Value Ref Range Status   Specimen Description SPU  Final   Special Requests NONE  Final   Sputum evaluation THIS SPECIMEN IS ACCEPTABLE FOR SPUTUM CULTURE  Final   Report Status 04/20/2016 FINAL  Final  Culture, respiratory (NON-Expectorated)     Status: None (Preliminary result)   Collection Time: 04/20/16  8:32 PM  Result Value Ref Range Status   Specimen Description SPUTUM  Final   Special Requests NONE  Final   Gram Stain   Final    MODERATE WBC PRESENT, PREDOMINANTLY PMN FEW SQUAMOUS EPITHELIAL CELLS PRESENT FEW GRAM NEGATIVE RODS FEW GRAM POSITIVE RODS FEW GRAM POSITIVE COCCI IN PAIRS IN CHAINS IN CLUSTERS THIS SPECIMEN IS ACCEPTABLE FOR SPUTUM CULTURE Performed at [facility]    Culture PENDING  Incomplete   Report Status PENDING  Incomplete  Surgical PCR screen     Status: Abnormal   Collection Time: 04/21/16  1:00 AM  Result Value Ref Range Status   MRSA, PCR NEGATIVE NEGATIVE Final   Staphylococcus aureus POSITIVE (A) NEGATIVE Final  Gram stain     Status:  None   Collection Time: 04/21/16  8:21 AM  Result Value Ref Range Status   Specimen Description FLUID PERICARDIAL  Final   Special Requests NONE  Final   Gram Stain   Final    MODERATE WBC PRESENT,BOTH PMN AND MONONUCLEAR NO ORGANISMS SEEN    Report Status 04/21/2016 FINAL  Final      Radiology Studies: Ct Abdomen Pelvis Wo Contrast 04/20/2016   Partially visualized large pericardial effusion. Right lower lobe consolidation. Bilateral pleural effusions. Cholelithiasis.  Dg Chest 2 View 04/18/2016   Bilateral lower lobe airspace opacities may reflect pneumonia. Mild cardiomegaly. Electronically Signed   By: Rolm Baptise M.D.   On: 04/18/2016 13:07   Ct Chest W Contrast 04/20/2016 1. Massive low-density pericardial effusion that has developed since 02/07/2016. No pericardial mass noted. 2. 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  3. Right middle lobe airway obstruction and collapse due to right hilar adenopathy. Right lung edema attributed to malignant lymphatic obstruction.   Dg Chest Port 1 View 04/21/2016  Mild perihilar edema with small left pleural effusion.   DG Chest XRay IMPRESSION: 1. Increasing rounded opacity projected over the right infrahilar region correlates with adenopathy and atelectasis on a recent CT scan. 2. Mildly worsened layering effusions, right greater than left. 3. Mild edema.   Scheduled Meds: . acetaminophen  1,000 mg Oral Q6H   Or  . acetaminophen (TYLENOL) oral liquid 160 mg/5 mL  1,000 mg Oral Q6H  . bisacodyl  10 mg Oral Daily  . ceFEPime (MAXIPIME) IV  1 g Intravenous Q8H  . enoxaparin (LOVENOX) injection  40 mg Subcutaneous Q24H  . mirtazapine  15 mg Oral QHS  . mupirocin ointment  1 application Nasal BID  . senna-docusate  1 tablet Oral QHS  . sodium chloride flush  10-40 mL Intracatheter Q12H  . vancomycin  1,000 mg Intravenous Q12H   Continuous Infusions: . 0.45 % NaCl with KCl 20 mEq / L 100 mL/hr at  04/22/16 0642     LOS: 2 days    Time spent: 25 minutes  Greater than 50% of the time spent on counseling and coordinating the care.   [facility], MD [facility] Pager [phone]  If 7PM-7AM, please contact night-coverage [email or web address] Password [facility] 04/22/2016, 2:14 PM

## 2016-04-23 ENCOUNTER — Other Ambulatory Visit: Payer: Self-pay | Admitting: Oncology

## 2016-04-23 LAB — VANCOMYCIN, TROUGH: Vancomycin Tr: 7 ug/mL — ABNORMAL LOW (ref 10.0–20.0)

## 2016-04-23 LAB — HEPATIC FUNCTION PANEL
ALT: 629 U/L — AB (ref 17–63)
AST: 143 U/L — ABNORMAL HIGH (ref 15–41)
Albumin: 2 g/dL — ABNORMAL LOW (ref 3.5–5.0)
Alkaline Phosphatase: 81 U/L (ref 38–126)
BILIRUBIN DIRECT: 0.3 mg/dL (ref 0.1–0.5)
Indirect Bilirubin: 0.7 mg/dL (ref 0.3–0.9)
TOTAL PROTEIN: 4.7 g/dL — AB (ref 6.5–8.1)
Total Bilirubin: 1 mg/dL (ref 0.3–1.2)

## 2016-04-23 LAB — CBC
HCT: 33.4 % — ABNORMAL LOW (ref 39.0–52.0)
Hemoglobin: 10.8 g/dL — ABNORMAL LOW (ref 13.0–17.0)
MCH: 28.5 pg (ref 26.0–34.0)
MCHC: 32.3 g/dL (ref 30.0–36.0)
MCV: 88.1 fL (ref 78.0–100.0)
PLATELETS: 139 10*3/uL — AB (ref 150–400)
RBC: 3.79 MIL/uL — AB (ref 4.22–5.81)
RDW: 14.1 % (ref 11.5–15.5)
WBC: 7 10*3/uL (ref 4.0–10.5)

## 2016-04-23 LAB — CULTURE, RESPIRATORY W GRAM STAIN

## 2016-04-23 LAB — SEDIMENTATION RATE: Sed Rate: 18 mm/hr — ABNORMAL HIGH (ref 0–16)

## 2016-04-23 LAB — CULTURE, RESPIRATORY: CULTURE: NORMAL

## 2016-04-23 LAB — LEGIONELLA PNEUMOPHILA SEROGP 1 UR AG: L. pneumophila Serogp 1 Ur Ag: NEGATIVE

## 2016-04-23 LAB — C-REACTIVE PROTEIN: CRP: 5.4 mg/dL — AB (ref <1.0)

## 2016-04-23 LAB — URIC ACID: URIC ACID, SERUM: 6.1 mg/dL (ref 4.4–7.6)

## 2016-04-23 MED ORDER — VANCOMYCIN HCL IN DEXTROSE 1-5 GM/200ML-% IV SOLN
1000.0000 mg | Freq: Three times a day (TID) | INTRAVENOUS | Status: DC
  Administered 2016-04-23 – 2016-04-25 (×6): 1000 mg via INTRAVENOUS
  Filled 2016-04-23 (×7): qty 200

## 2016-04-23 NOTE — Progress Notes (Addendum)
Pharmacy Antibiotic Note  Joel Osborne is a 50 y.o. male admitted on 04/20/2016 with pneumonia, bilateral lower lobe airspace opacities on 5/09 CXR. Levofloxacin initiated without improvement. Also found to have large pleural effusion now s/p pericardial window on 5/12. Pharmacy consulted for vancomycin dosing.    Vanc trough subtherapeutic at 7, drawn at steady state 11 post dose on 1000 mg IV q12h regimen. Continues to be afebrile, WBC now wnl, Cr 0.71 yesterday. Also on cefepime.  Plan: Increase vancomycin to 1000 mg IV q8h VT at new dose steady state Monitor renal function F/u culture results, duration of abx therapy   Height: 6' (182.9 cm) Weight: 192 lb 0.3 oz (87.1 kg) IBW/kg (Calculated) : 77.6  Temp (24hrs), Avg:98.3 F (36.8 C), Min:98 F (36.7 C), Max:98.6 F (37 C)   Recent Labs Lab 04/20/16 1640 04/20/16 2030 04/20/16 2310 04/21/16 0440 04/22/16 0307 04/23/16 0530 04/23/16 0548  WBC 13.5*  --   --  15.4* 7.6 7.0  --   CREATININE 1.25*  --   --  1.07 0.71  --   --   LATICACIDVEN 3.7* 3.2* 3.8*  --   --   --   --   VANCOTROUGH  --   --   --   --   --   --  7*    Estimated Creatinine Clearance: 119.9 mL/min (by C-G formula based on Cr of 0.71).    Allergies  Allergen Reactions  . Lactose Intolerance (Gi) Other (See Comments)    Upset stomach     Antimicrobials this admission: 5/11 Vancomycin >>  5/11 Cefepime >>   Dose adjustments this admission: 5/14 vancomycin 1000 mg IV q12h increased to 1000 mg q8h  Microbiology results: 5/11 BCx: ngtd 5/11 Strep pneumo ur ag: negative 5/11 Legionella ur ag: neg 5/11 Sputum: pending 5/12 pleural fluid: pending MRSA PCR positive   Thank you for allowing pharmacy to be a part of this patient's care.  Joel Osborne 04/23/2016 8:01 AM

## 2016-04-23 NOTE — Progress Notes (Signed)
[facility]                                                                              Patient Demographics  Joel Osborne, is a 50 y.o. male, DOB - [birth year], OEU:[id number]  Admit date - 04/20/2016   Admitting Physician Kelvin Cellar, MD  Outpatient Primary MD for the patient is [name], MD  Outpatient specialists:   LOS - 3  days    No chief complaint on file.      Brief summary   50 y.o. male with a past medical history significant for adenocarcinoma of the distal esophagus/gastric cardia, diagnosed in 2014, s/p esophagectomy by Dr. Servando Snare. 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   Patient presented with progressively worsening cough associated with clear/yellow sputum production, subjective fevers, chills, generalized weakness, malaise, fatigue, poor tolerance to physical exertion. Chest x-ray performed on 04/18/2016 showed bilateral lower lobe airspace opacities consistent with pneumonia and an enlarged cardiac silouette. He was started on Levaquin but did not improve. CT chest subsequently showed a large pericardial effusion and progression of his metastatic disease. ECHO confirmed large pleural effusion and findings consistent with tamponade physiology. Status post subxiphoid pericardial window on 04/21/16.   Assessment & Plan   Principal Problems:  Acute respiratory failure with hypoxia (HCC) / HCAP (healthcare-associated pneumonia) / Right middle lobe airway obstruction and collapse due to right hilar adenopathy / Right lung edema secondary to malignant lymphatic obstruction - Stable resp status, O2 sats 94% on 40% FiO2 - Has trach and chest tube  - Aggressive pulmonary toilet. - Continue IV vancomycin and cefepime   Pericardial effusion with cardiac tamponade - S/P pericardial window on 5/12 - Management  per thoracic surgery. Chest tube: 370 mL output-leaving in place for now.   Sepsis due to pneumonia ([place]) / Leukocytosis - Sepsis criteria met with fever, tachycardia, tachypnea, hypoxia (O2 saturation 92% on [place] oxygen) support), leukocytosis, lactic acidosis. Sepsis physiology resolved - Presumed source of infections HCAP, Continue vanco and cefepime  - Blood cultures 2: Negative to date. HIV non reactive  - Strep pneumonia ag is negative  - Resp culture in process, multiple GNR, GPR GPC so far on prelim study    Adenocarcinoma of distal third of esophagus/gastric cardia [facility]) - Appreciate Dr. Benay Spice following - Last chemo 04/07/2016   Anemia of chronic disease - Due to sequela of chemotherapy  - Mild hemoglobin dropped from 11.8 > 10.4. Follow CBCs.   Moderate protein calorie malnutrition - In the context of chronic illness - Seen by nutritionist   History of bilateral vocal cord paralysis, status post tracheostomy and bilateral injection laryngoplasty at [facility] 10/27/15  Abnormal LFTs: Possibly due to tamponad versus shock liver from sepsis  - per oncology, less likely due to chemotherapy, Improving - Follow LFTs closely. No GI symptoms reported  Left knee pain - Obtain ESR, CRP, uric acid  Code Status: Full code  DVT Prophylaxis:  Lovenox    Family Communication: Discussed in detail with the patient, all imaging  results, lab results explained to the patient  Disposition Plan: Continue monitoring in stepdown unit  Time Spent in minutes  25 minutes  Consultants:   Hematology, Dr. Thornton Papas   Cardiothoracic surgery, Dr. Dorris Fetch  Procedures:   Pericardial Window 04/21/2016   ECHO 04/20/2016 - EF 60%; Pericardium, extracardiac: A large, free-flowing pericardial effusion was identified circumferential to the heart. The fluid had no internal echoes. There was moderateright ventricular chamber collapse for more than 50% of the cardiac cycle. There  was evidence for moderately increased RV-LV interaction demonstrated by respirophasic changes in transmitral velocities. Features were consistent with tamponade physiology.  Foley catheter   Antimicrobials:   Vanco 04/20/2016 -->  Cefepime 04/20/2016 -->    Medications  Scheduled Meds: . acetaminophen  1,000 mg Oral Q6H   Or  . acetaminophen (TYLENOL) oral liquid 160 mg/5 mL  1,000 mg Oral Q6H  . bisacodyl  10 mg Oral Daily  . ceFEPime (MAXIPIME) IV  1 g Intravenous Q8H  . enoxaparin (LOVENOX) injection  40 mg Subcutaneous Q24H  . mirtazapine  15 mg Oral QHS  . mupirocin ointment  1 application Nasal BID  . senna-docusate  1 tablet Oral QHS  . sodium chloride flush  10-40 mL Intracatheter Q12H  . vancomycin  1,000 mg Intravenous Q8H   Continuous Infusions: . 0.45 % NaCl with KCl 20 mEq / L 100 mL/hr at 04/22/16 1509   PRN Meds:.benzonatate, HYDROcodone-homatropine, HYDROmorphone (DILAUDID) injection, LORazepam, ondansetron (ZOFRAN) IV, oxyCODONE, potassium chloride, sodium chloride flush, zolpidem   Antibiotics   Anti-infectives    Start     Dose/Rate Route Frequency Ordered Stop   04/23/16 1335  vancomycin (VANCOCIN) IVPB 1000 mg/200 mL premix     1,000 mg 200 mL/hr over 60 Minutes Intravenous Every 8 hours 04/23/16 0812     04/21/16 0600  vancomycin (VANCOCIN) IVPB 1000 mg/200 mL premix  Status:  Discontinued     1,000 mg 200 mL/hr over 60 Minutes Intravenous Every 12 hours 04/20/16 1731 04/23/16 0812   04/20/16 1630  ceFEPIme (MAXIPIME) 1 g in dextrose 5 % 50 mL IVPB     1 g 100 mL/hr over 30 Minutes Intravenous Every 8 hours 04/20/16 1615 04/28/16 1759   04/20/16 1630  vancomycin (VANCOCIN) 1,250 mg in sodium chloride 0.9 % 250 mL IVPB     1,250 mg 166.7 mL/hr over 90 Minutes Intravenous  Once 04/20/16 1623 04/20/16 2019        Subjective:   Joel Osborne was seen and examined today. Per patient had a rough night with respiratory issues. However currently  feeling better. Patient denies dizziness, chest pain, abdominal pain, N/V/D/C, new weakness, numbess, tingling.   Objective:   Filed Vitals:   04/23/16 0407 04/23/16 0418 04/23/16 0745 04/23/16 0943  BP: 97/66     Pulse: 93 95    Temp: 98.1 F (36.7 C)  98.4 F (36.9 C)   TempSrc: Oral  Oral   Resp: 19 32    Height:      Weight:      SpO2: 98% 96%  94%    Intake/Output Summary (Last 24 hours) at 04/23/16 0953 Last data filed at 04/23/16 0600  Gross per 24 hour  Intake   2900 ml  Output    775 ml  Net   2125 ml     Wt Readings from Last 3 Encounters:  04/21/16 87.1 kg (192 lb 0.3 oz)  04/20/16 83.87 kg (184 lb 14.4 oz)  04/18/16 84.142  kg (185 lb 8 oz)     Exam  General: Alert and oriented x 3, NADUncomfortable   HEENT:    Neck:Trach   Cardiovascular: S1 S2 auscultated, no rubs, murmurs or gallops. Regular rate and rhythm.+ Chest tube   Respiratory:Diminished lung sounds at the bases   Gastrointestinal: Soft, nontender, nondistended, + bowel sounds  Ext: no cyanosis clubbing or edema  Neuro: AAOx3, Cr N's II- XII. Strength 5/5 upper and lower extremities bilaterally  Skin: No rashes  Psych: Normal affect and demeanor, alert and oriented x3    Data Reviewed:  I have personally reviewed following labs and imaging studies  Micro Results Recent Results (from the past 240 hour(s))  Culture, blood (routine x 2) Call MD if unable to obtain prior to antibiotics being given     Status: None (Preliminary result)   Collection Time: 04/20/16  4:37 PM  Result Value Ref Range Status   Specimen Description BLOOD LEFT ANTECUBITAL  Final   Special Requests BOTTLES DRAWN AEROBIC AND ANAEROBIC 5CC  Final   Culture   Final    NO GROWTH 2 DAYS Performed at [facility]    Report Status PENDING  Incomplete  Culture, blood (routine x 2) Call MD if unable to obtain prior to antibiotics being given     Status: None (Preliminary result)   Collection Time: 04/20/16   4:37 PM  Result Value Ref Range Status   Specimen Description BLOOD RIGHT ARM  Final   Special Requests BOTTLES DRAWN AEROBIC AND ANAEROBIC 10CC  Final   Culture   Final    NO GROWTH 2 DAYS Performed at [facility]    Report Status PENDING  Incomplete  Culture, sputum-assessment     Status: None   Collection Time: 04/20/16  8:32 PM  Result Value Ref Range Status   Specimen Description SPU  Final   Special Requests NONE  Final   Sputum evaluation THIS SPECIMEN IS ACCEPTABLE FOR SPUTUM CULTURE  Final   Report Status 04/20/2016 FINAL  Final  Culture, respiratory (NON-Expectorated)     Status: None (Preliminary result)   Collection Time: 04/20/16  8:32 PM  Result Value Ref Range Status   Specimen Description SPUTUM  Final   Special Requests NONE  Final   Gram Stain   Final    MODERATE WBC PRESENT, PREDOMINANTLY PMN FEW SQUAMOUS EPITHELIAL CELLS PRESENT FEW GRAM NEGATIVE RODS FEW GRAM POSITIVE RODS FEW GRAM POSITIVE COCCI IN PAIRS IN CHAINS IN CLUSTERS THIS SPECIMEN IS ACCEPTABLE FOR SPUTUM CULTURE Performed at [facility]    Culture   Final    Culture reincubated for better growth Performed at [facility]    Report Status PENDING  Incomplete  Surgical PCR screen     Status: Abnormal   Collection Time: 04/21/16  1:00 AM  Result Value Ref Range Status   MRSA, PCR NEGATIVE NEGATIVE Final   Staphylococcus aureus POSITIVE (A) NEGATIVE Final    Comment:        The Xpert SA Assay (FDA approved for NASAL specimens in patients over 58 years of age), is one component of a comprehensive surveillance program.  Test performance has been validated by [facility] for patients greater than or equal to 7 year old. It is not intended to diagnose infection nor to guide or monitor treatment.   Culture, body fluid-bottle     Status: None (Preliminary result)   Collection Time: 04/21/16  8:21 AM  Result Value Ref Range  Status   Specimen Description FLUID  PERICARDIAL  Final   Special Requests NONE  Final   Culture NO GROWTH 1 DAY  Final   Report Status PENDING  Incomplete  Gram stain     Status: None   Collection Time: 04/21/16  8:21 AM  Result Value Ref Range Status   Specimen Description FLUID PERICARDIAL  Final   Special Requests NONE  Final   Gram Stain   Final    MODERATE WBC PRESENT,BOTH PMN AND MONONUCLEAR NO ORGANISMS SEEN    Report Status 04/21/2016 FINAL  Final    Radiology Reports Ct Abdomen Pelvis Wo Contrast  04/20/2016  CLINICAL DATA:  History of esophageal carcinoma cough productive fevers chills, elevated liver enzymes EXAM: CT ABDOMEN AND PELVIS WITHOUT CONTRAST TECHNIQUE: Multidetector CT imaging of the abdomen and pelvis was performed following the standard protocol without IV contrast. COMPARISON:  04/20/2016 CT thorax FINDINGS: Small left and moderate right pleural effusion. Large pericardial effusion partially visualized. Gastric pull-up noted consistent with history of esophageal carcinoma. Partially visualized right lower lobe consolidation. Liver appears normal, as does the pancreas and as does spleen given limited evaluation without contrast. There are several small gallstones. No adrenal masses. No significant renal abnormalities. 1 cm cyst upper pole left kidney and 4 mm cyst lower pole right kidney. No hydronephrosis. IV contrast concentrated within the dependent portion of the bladder. Nonobstructive bowel gas pattern.  Small and large bowel normal. Reproductive organs normal. No significant free fluid. No significant vascular abnormalities. No significant adenopathy. No acute musculoskeletal findings. IMPRESSION: Partially visualized large pericardial effusion. Right lower lobe consolidation. Bilateral pleural effusions. Cholelithiasis. Electronically Signed   By: Esperanza Heir M.D.   On: 04/20/2016 19:49   Dg Chest 2 View  04/18/2016  CLINICAL DATA:  Esophageal cancer. Productive cough since Saturday. Shortness of  breath. EXAM: CHEST  2 VIEW COMPARISON:  CT 02/07/2016 FINDINGS: Airspace opacity noted in both lower lobes concerning for pneumonia. Heart is mildly enlarged. Tracheostomy tube and right Port-A-Cath in place. No visible effusions. No acute bony abnormality. IMPRESSION: Bilateral lower lobe airspace opacities may reflect pneumonia. Mild cardiomegaly. Electronically Signed   By: Charlett Nose M.D.   On: 04/18/2016 13:07   Ct Chest W Contrast  04/20/2016  CLINICAL DATA:  Healthcare associated pneumonia. EXAM: CT CHEST WITH CONTRAST TECHNIQUE: Multidetector CT imaging of the chest was performed during intravenous contrast administration. CONTRAST:  24mL ISOVUE-300 IOPAMIDOL (ISOVUE-300) INJECTION 61% COMPARISON:  02/07/2016 FINDINGS: THORACIC INLET/BODY WALL: Porta catheter on the right with tip in good position at the upper cavoatrial junction. There is tracheostomy tube which is well seated. No thoracic inlet adenopathy. MEDIASTINUM: Normal heart size. New and massive pericardial effusion measuring up to 4 cm in thickness. There is smooth mild serosal enhancement. Pleural fluid has developed fairly rapidly, new since 02/07/2016. Changes of gastric pull-through. There is ill-defined adenopathy in the right hilum with right middle lobe bronchus obstruction and narrowing of right lower lobe pulmonary artery. This has a malignant appearance. Hilar adenopathy on the left is milder. Isolated index precarinal node has heterogeneous low-density appearance. This node measures 10 mm short axis compared 8 mm previously. LUNG WINDOWS: Septal thickening on the right which is likely from lymphatic obstruction. This appears fairly smooth there is no definitive parenchymal lymphangitic tumor. Nearly collapsed right middle lobe. Lobulated pulmonary metastases have enlarged, left lower lobe 5:81 measuring up to 23 mm compared to 19 mm previously. A lingular lobulated metastasis measures 14 mm compared to  11 previously. Small  layering pleural effusions. UPPER ABDOMEN: Cholelithiasis. There is mild haziness of fat around the gallbladder but no over distention or definitive cholecystitis. Left diaphragmatic hernia containing colon and pancreatic tail. OSSEOUS: No acute fracture.  No suspicious lytic or blastic lesions. These results were called by telephone at the time of interpretation on 04/20/2016 at 7:37 pm to Dr. Baltazar Najjar who verbally acknowledged these results. IMPRESSION: 1. Massive low-density pericardial effusion that has developed since 02/07/2016. No pericardial mass noted. 2. 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 3. Right middle lobe airway obstruction and collapse due to right hilar adenopathy. Right lung edema attributed to malignant lymphatic obstruction. Electronically Signed   By: Monte Fantasia M.D.   On: 04/20/2016 19:38   Dg Chest Port 1 View  04/22/2016  CLINICAL DATA:  Evaluate effusion. EXAM: PORTABLE CHEST 1 VIEW COMPARISON:  Apr 18, 2016, CT scan Apr 20, 2016, and chest x-ray Apr 21, 2016 FINDINGS: A tracheostomy tube and right Port-A-Cath are stable. No pneumothorax. There is rounded opacity projected over the right hilum, mildly increased in the interval. This was not seen on Apr 18, 2016 and there was atelectasis in this region on the recent CT scan. Adenopathy was also seen in the right hilum on the recent CT scan. Layering bilateral effusions are again seen. Underlying compressive atelectasis. No change in the cardiomediastinal silhouette. Suggested mild edema. IMPRESSION: 1. Increasing rounded opacity projected over the right infrahilar region correlates with adenopathy and atelectasis on a recent CT scan. 2. Mildly worsened layering effusions, right greater than left. 3. Mild edema. Electronically Signed   By: Dorise Bullion III M.D   On: 04/22/2016 07:24   Dg Chest Port 1 View  04/21/2016  CLINICAL DATA:  Postop chest, pericardial effusion EXAM: PORTABLE CHEST 1 VIEW  COMPARISON:  CT chest dated 04/20/2016 FINDINGS: Patient is rotated. Tracheostomy in satisfactory position. The heart is top-normal in size. Mild perihilar edema. Small left pleural effusion. Mild right infrahilar opacity, likely atelectasis. Right chest port terminates the cavoatrial junction. IMPRESSION: Mild perihilar edema with small left pleural effusion. Electronically Signed   By: Julian Hy M.D.   On: 04/21/2016 09:43    Lab Data:  CBC:  Recent Labs Lab 04/20/16 1640 04/21/16 0440 04/22/16 0307 04/23/16 0530  WBC 13.5* 15.4* 7.6 7.0  NEUTROABS 11.2* 12.5*  --   --   HGB 12.7* 11.8* 10.4* 10.8*  HCT 38.2* 37.3* 33.2* 33.4*  MCV 85.8 88.4 89.0 88.1  PLT 297 219 153 601*   Basic Metabolic Panel:  Recent Labs Lab 04/20/16 1640 04/21/16 0440 04/22/16 0307  NA 137 139 138  K 5.0 4.7 3.6  CL 99* 104 107  CO2 '23 22 23  '$ GLUCOSE 184* 158* 88  BUN 31* 28* 19  CREATININE 1.25* 1.07 0.71  CALCIUM 8.9 8.5* 7.6*   GFR: Estimated Creatinine Clearance: 119.9 mL/min (by C-G formula based on Cr of 0.71). Liver Function Tests:  Recent Labs Lab 04/20/16 1640 04/21/16 0440 04/22/16 0307 04/23/16 0530  AST 898* 2014* 525* 143*  ALT 918* 1872* 1035* 629*  ALKPHOS 145* 126 90 81  BILITOT 2.1* 1.8* 1.0 1.0  PROT 7.1 6.2* 4.7* 4.7*  ALBUMIN 3.5 2.9* 2.1* 2.0*   No results for input(s): LIPASE, AMYLASE in the last 168 hours. No results for input(s): AMMONIA in the last 168 hours. Coagulation Profile:  Recent Labs Lab 04/20/16 1827  INR 2.05*   Cardiac Enzymes: No results for input(s): CKTOTAL, CKMB,  CKMBINDEX, TROPONINI in the last 168 hours. BNP (last 3 results) No results for input(s): PROBNP in the last 8760 hours. HbA1C: No results for input(s): HGBA1C in the last 72 hours. CBG:  Recent Labs Lab 04/21/16 0549 04/21/16 0911 04/21/16 1138 04/21/16 1509  GLUCAP 142* 149* 109* 108*   Lipid Profile: No results for input(s): CHOL, HDL, LDLCALC, TRIG,  CHOLHDL, LDLDIRECT in the last 72 hours. Thyroid Function Tests: No results for input(s): TSH, T4TOTAL, FREET4, T3FREE, THYROIDAB in the last 72 hours. Anemia Panel: No results for input(s): VITAMINB12, FOLATE, FERRITIN, TIBC, IRON, RETICCTPCT in the last 72 hours. Urine analysis:    Component Value Date/Time   COLORURINE AMBER* 04/20/2016 2033   APPEARANCEUR CLEAR 04/20/2016 2033   LABSPEC 1.042* 04/20/2016 2033   PHURINE 5.5 04/20/2016 2033   GLUCOSEU NEGATIVE 04/20/2016 2033   HGBUR NEGATIVE 04/20/2016 2033   BILIRUBINUR SMALL* 04/20/2016 2033   KETONESUR NEGATIVE 04/20/2016 2033   PROTEINUR NEGATIVE 04/20/2016 2033   UROBILINOGEN 0.2 07/03/2013 1000   NITRITE NEGATIVE 04/20/2016 2033   LEUKOCYTESUR NEGATIVE 04/20/2016 2033     Mariavictoria Nottingham M.D. [facility] 04/23/2016, 9:53 AM  Pager: [phone] Between 7am to 7pm - call Pager - [phone]  After 7pm go to [email or web address] - password [email or web address]  Call night coverage person covering after 7pm

## 2016-04-23 NOTE — Progress Notes (Addendum)
      [address]       [place] [address]             [phone]      2 Days Post-Op Procedure(s) (LRB): SUBXYPHOID PERICARDIAL WINDOW (N/A)   Subjective:  Mr. Joel Osborne had a tough night.  He had some respiratory issues, but is doing better this morning.  He wants his foley removed.  He also states his left knee is tender and swollen  Objective: Vital signs in last 24 hours: Temp:  [98 F (36.7 C)-98.6 F (37 C)] 98.4 F (36.9 C) (05/14 0745) Pulse Rate:  [92-101] 95 (05/14 0418) Cardiac Rhythm:  [-] Normal sinus rhythm (05/14 0700) Resp:  [17-32] 32 (05/14 0418) BP: (82-97)/(55-67) 97/66 mmHg (05/14 0407) SpO2:  [88 %-100 %] 94 % (05/14 0943) FiO2 (%):  [28 %-60 %] 40 % (05/14 0943)  Intake/Output from previous day: 05/13 0701 - 05/14 0700 In: 2900 [P.O.:500; I.V.:2300; IV Piggyback:100] Out: 925 [Urine:875; Chest Tube:50]  General appearance: alert, cooperative and no distress Heart: regular rate and rhythm Lungs: clear to auscultation bilaterally Abdomen: soft, non-tender; bowel sounds normal; no masses,  no organomegaly Extremities: edema mild left knee, non erythematous Wound: clean and dry  Lab Results:  Recent Labs  04/22/16 0307 04/23/16 0530  WBC 7.6 7.0  HGB 10.4* 10.8*  HCT 33.2* 33.4*  PLT 153 139*   BMET:  Recent Labs  04/21/16 0440 04/22/16 0307  NA 139 138  K 4.7 3.6  CL 104 107  CO2 22 23  GLUCOSE 158* 88  BUN 28* 19  CREATININE 1.07 0.71  CALCIUM 8.5* 7.6*    PT/INR:  Recent Labs  04/20/16 1827  LABPROT 23.0*  INR 2.05*   ABG    Component Value Date/Time   PHART 7.447 04/21/2016 0550   HCO3 17.7* 04/21/2016 0550   TCO2 18.5 04/21/2016 0550   ACIDBASEDEF 5.6* 04/21/2016 0550   O2SAT 94.9 04/21/2016 0550   CBG (last 3)   Recent Labs  04/21/16 0911 04/21/16 1138 04/21/16 1509  GLUCAP 149* 109* 108*    Assessment/Plan: S/P Procedure(s) (LRB): SUBXYPHOID PERICARDIAL WINDOW (N/A)  1. Chest  tube-30 cc output yesterday- possibly remove today 2. Pulm- on trach, continue respiratory care 3. Left Knee pain, edema- doesn't appear to be Gout and patient denies history, likely small joint effusion 4. ID- HCAP, continue ABX 5. Dispo- care per primary, ok to remove foley from our standpoint   LOS: 3 days    BARRETT, ERIN 04/23/2016  Some knee pain, leg not swollen or tender Pericardial tube out today I have seen and examined Elvera Bicker and agree with the above assessment  and plan.  [name] Beeper [phone] Office [phone] 04/23/2016 7:05 PM

## 2016-04-24 ENCOUNTER — Inpatient Hospital Stay (HOSPITAL_COMMUNITY): Payer: BLUE CROSS/BLUE SHIELD

## 2016-04-24 ENCOUNTER — Encounter (HOSPITAL_COMMUNITY): Payer: Self-pay | Admitting: Thoracic Surgery (Cardiothoracic Vascular Surgery)

## 2016-04-24 DIAGNOSIS — M79605 Pain in left leg: Secondary | ICD-10-CM

## 2016-04-24 DIAGNOSIS — Z7189 Other specified counseling: Secondary | ICD-10-CM

## 2016-04-24 DIAGNOSIS — R748 Abnormal levels of other serum enzymes: Secondary | ICD-10-CM | POA: Diagnosis present

## 2016-04-24 DIAGNOSIS — Z4682 Encounter for fitting and adjustment of non-vascular catheter: Secondary | ICD-10-CM

## 2016-04-24 DIAGNOSIS — M79602 Pain in left arm: Secondary | ICD-10-CM

## 2016-04-24 LAB — CBC
HCT: 37.1 % — ABNORMAL LOW (ref 39.0–52.0)
Hemoglobin: 11.8 g/dL — ABNORMAL LOW (ref 13.0–17.0)
MCH: 27.8 pg (ref 26.0–34.0)
MCHC: 31.8 g/dL (ref 30.0–36.0)
MCV: 87.3 fL (ref 78.0–100.0)
PLATELETS: 138 10*3/uL — AB (ref 150–400)
RBC: 4.25 MIL/uL (ref 4.22–5.81)
RDW: 14.1 % (ref 11.5–15.5)
WBC: 6.7 10*3/uL (ref 4.0–10.5)

## 2016-04-24 LAB — COMPREHENSIVE METABOLIC PANEL
ALT: 512 U/L — ABNORMAL HIGH (ref 17–63)
ANION GAP: 5 (ref 5–15)
AST: 103 U/L — ABNORMAL HIGH (ref 15–41)
Albumin: 2.2 g/dL — ABNORMAL LOW (ref 3.5–5.0)
Alkaline Phosphatase: 93 U/L (ref 38–126)
CALCIUM: 7.8 mg/dL — AB (ref 8.9–10.3)
CHLORIDE: 105 mmol/L (ref 101–111)
CO2: 25 mmol/L (ref 22–32)
Creatinine, Ser: 0.51 mg/dL — ABNORMAL LOW (ref 0.61–1.24)
GLUCOSE: 117 mg/dL — AB (ref 65–99)
POTASSIUM: 3.9 mmol/L (ref 3.5–5.1)
SODIUM: 135 mmol/L (ref 135–145)
TOTAL PROTEIN: 5.6 g/dL — AB (ref 6.5–8.1)
Total Bilirubin: 0.9 mg/dL (ref 0.3–1.2)

## 2016-04-24 MED ORDER — PREDNISONE 20 MG PO TABS
40.0000 mg | ORAL_TABLET | Freq: Every day | ORAL | Status: AC
  Administered 2016-04-24 – 2016-04-26 (×3): 40 mg via ORAL
  Filled 2016-04-24 (×3): qty 2

## 2016-04-24 NOTE — Progress Notes (Signed)
*  PRELIMINARY RESULTS* Vascular Ultrasound Lower extremity venous duplex has been completed.  Preliminary findings: No evidence of DVT or baker's cyst.   Landry Mellow, RDMS, RVT  04/24/2016, 9:56 AM

## 2016-04-24 NOTE — Progress Notes (Signed)
3 Days Post-Op Procedure(s) (LRB): SUBXYPHOID PERICARDIAL WINDOW (N/A) Subjective: Still having left knee and elbow pain  Objective: Vital signs in last 24 hours: Temp:  [98.1 F (36.7 C)-99 F (37.2 C)] 99 F (37.2 C) (05/15 0816) Pulse Rate:  [96-116] 99 (05/15 0755) Cardiac Rhythm:  [-] Normal sinus rhythm (05/15 0700) Resp:  [16-35] 18 (05/15 0755) BP: (89-107)/(65-79) 98/79 mmHg (05/15 0737) SpO2:  [93 %-97 %] 96 % (05/15 0755) FiO2 (%):  [35 %-40 %] 35 % (05/15 0755)  Hemodynamic parameters for last 24 hours:    Intake/Output from previous day: 05/14 0701 - 05/15 0700 In: 2438.3 [I.V.:1988.3; IV Piggyback:450] Out: 451 [Urine:450; Stool:1] Intake/Output this shift: Total I/O In: 120 [P.O.:120] Out: 450 [Urine:450]  General appearance: alert, cooperative and no distress Neurologic: intact Heart: regular rate and rhythm Lungs: diminished breath sounds bibasilar Abdomen: normal findings: soft, non-tender and wound clean and dry Extremities: tenderness and effusion left knee. no redness or warmth  Lab Results:  Recent Labs  04/23/16 0530 04/24/16 0500  WBC 7.0 6.7  HGB 10.8* 11.8*  HCT 33.4* 37.1*  PLT 139* 138*   BMET:  Recent Labs  04/22/16 0307 04/24/16 0500  NA 138 135  K 3.6 3.9  CL 107 105  CO2 23 25  GLUCOSE 88 117*  BUN 19 <5*  CREATININE 0.71 0.51*  CALCIUM 7.6* 7.8*    PT/INR: No results for input(s): LABPROT, INR in the last 72 hours. ABG    Component Value Date/Time   PHART 7.447 04/21/2016 0550   HCO3 17.7* 04/21/2016 0550   TCO2 18.5 04/21/2016 0550   ACIDBASEDEF 5.6* 04/21/2016 0550   O2SAT 94.9 04/21/2016 0550   CBG (last 3)   Recent Labs  04/21/16 0911 04/21/16 1138 04/21/16 1509  GLUCAP 149* 109* 108*    Assessment/Plan: S/P Procedure(s) (LRB): SUBXYPHOID PERICARDIAL WINDOW (N/A) -  S/p pericardial window- CT is out, hemodynamics stable Wean O2 C/o left knee and elbow pain He is scheduled for a duplex  although I don't think his pain is from a DVT Will check left knee films Possibly home in AM if ambulatory and off O2   LOS: 4 days    Melrose Nakayama 04/24/2016

## 2016-04-24 NOTE — Progress Notes (Signed)
IP PROGRESS NOTE  Subjective:   He reports feeling better compared to hospital admission. He complains of discomfort at the knees and elbows. He appears more comfortable from a respiratory standpoint.   Objective: Vital signs in last 24 hours: Blood pressure 98/79, pulse 99, temperature 98.1 F (36.7 C), temperature source Oral, resp. rate 18, height 6' (1.829 m), weight 192 lb 0.3 oz (87.1 kg), SpO2 96 %.  Intake/Output from previous day: 05/14 0701 - 05/15 0700 In: 2438.3 [I.V.:1988.3; IV Piggyback:450] Out: 451 [Urine:450; Stool:1]  Physical Exam:  HEENT: Tracheostomy in place Lungs: Good air movement bilaterally, no respiratory distress Cardiac: Regular rate and rhythm Abdomen: Subxiphoid gauze dressing Extremities: No leg edema Musculoskeletal: Knees and elbows without erythema or edema   Portacath/PICC-without erythema  Lab Results:  Recent Labs  04/23/16 0530 04/24/16 0500  WBC 7.0 6.7  HGB 10.8* 11.8*  HCT 33.4* 37.1*  PLT 139* 138*    BMET  Recent Labs  04/22/16 0307 04/24/16 0500  NA 138 135  K 3.6 3.9  CL 107 105  CO2 23 25  GLUCOSE 88 117*  BUN 19 <5*  CREATININE 0.71 0.51*  CALCIUM 7.6* 7.8*   AST 103, ALT 512, bilirubin 0.9 Studies/Results: Dg Chest 2 View  04/24/2016  CLINICAL DATA:  Shortness of breath, weakness, esophageal malignancy, acute respiratory failure, tracheostomy patient. EXAM: CHEST  2 VIEW COMPARISON:  Portable chest x-ray of Apr 22, 2016 FINDINGS: The lungs are hypoinflated. There is bibasilar atelectasis. There small bilateral pleural effusions. The cardiac silhouette remains enlarged. The pulmonary vascularity is less engorged. The tracheostomy appliance tip projects between the clavicular heads. The power port appliance tip projects over the distal third of the SVC. IMPRESSION: Decreased pulmonary interstitial edema today. Persistent bibasilar atelectasis with small bilateral pleural effusions. No pneumothorax. The support  tubes are in stable position. Electronically Signed   By: David  Martinique M.D.   On: 04/24/2016 07:30    Medications: I have reviewed the patient's current medications.  Assessment/Plan:  1. 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  Metastatic lung nodules confirmed on CT in December 2016  Currently being treated with pembrolizumab, last cycle given 04/07/2016  CT 04/20/2016 consistent with progressive disease involving a large pericardial effusion, bilateral pleural effusions, enlarged mediastinal lymph nodes and lung nodules  2. Respiratory failure secondary to a large pericardial effusion, right middle lobe collapse, and bilateral pleural effusions   Status post a pericardial window procedure 04/21/2016  3.  History of bilateral vocal cord paralysis, status post a tracheostomy and bilateral injection laryngoplasty at [facility] on 10/27/2015  4.  Elevated liver enzymes-likely secondary to cardiac tamponade-improved  5.  Fever/cough-most likely secondary to the pericardial effusion and tumor burden, cultures negative   His overall condition appears much improved compared to hospital admission. The pathology from the pericardial fluid and pericardium is pending. I discussed options of comfort care versus salvage chemotherapy if a diagnosis of progressive gastroesophageal carcinoma is confirmed. He indicates he would like to consider salvage chemotherapy despite the low chance of a clinical response.  I discussed the situation with his wife by telephone on 04/21/2016.  Recommendations:  1. Continue postoperative care per Dr. Roxan Hockey  2. I will follow-up on the pathology results and return to see Dr.Tsdelides on 04/25/2016 3.  Outpatient follow-up will be scheduled at the [facility] for the week of 05/01/2016.   LOS: 4 days   Betsy Coder, MD   04/24/2016, 8:12  AM   

## 2016-04-24 NOTE — Progress Notes (Signed)
[facility]                                                                              Patient Demographics  Joel Osborne, is a 50 y.o. male, DOB - [birth year], XLE:[id number]  Admit date - 04/20/2016   Admitting Physician Kelvin Cellar, MD  Outpatient Primary MD for the patient is [name], MD  Outpatient specialists:   LOS - 4  days    No chief complaint on file.      Brief summary   50 y.o. male with a past medical history significant for adenocarcinoma of the distal esophagus/gastric cardia, diagnosed in 2014, s/p esophagectomy by Dr. Servando Snare. 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   Patient presented with progressively worsening cough associated with clear/yellow sputum production, subjective fevers, chills, generalized weakness, malaise, fatigue, poor tolerance to physical exertion. Chest x-ray performed on 04/18/2016 showed bilateral lower lobe airspace opacities consistent with pneumonia and an enlarged cardiac silouette. He was started on Levaquin but did not improve. CT chest subsequently showed a large pericardial effusion and progression of his metastatic disease. ECHO confirmed large pleural effusion and findings consistent with tamponade physiology. Status post subxiphoid pericardial window on 04/21/16.   Assessment & Plan   Principal Problems:  Acute respiratory failure with hypoxia (HCC) / HCAP (healthcare-associated pneumonia) / Right middle lobe airway obstruction and collapse due to right hilar adenopathy / Right lung edema secondary to malignant lymphatic obstruction - Stable resp status, O2 sats 94% on 35% FiO2 - Has trach and chest tube  - Aggressive pulmonary toilet. - Continue IV vancomycin and cefepime, will change to oral antibiotics in a.m.   Pericardial effusion with cardiac tamponade - S/P  pericardial window on 5/12 - Management per thoracic surgery. Chest tube out.    Sepsis due to pneumonia ([place]) / Leukocytosis-resolved - Sepsis criteria met with fever, tachycardia, tachypnea, hypoxia (O2 saturation 92% on [place] oxygen) support), leukocytosis, lactic acidosis. Sepsis physiology resolved - Presumed source of infections HCAP, Continue vanco and cefepime  - Blood cultures 2: Negative to date. HIV non reactive  - Strep pneumonia ag is negative  - Resp culture in process, multiple GNR, GPR GPC so far on prelim study    Adenocarcinoma of distal third of esophagus/gastric cardia [facility]) - Appreciate Dr. Benay Spice following, will follow outpatient on 5/22. - Last chemo 04/07/2016   Anemia of chronic disease - Due to sequela of chemotherapy  - Currently stable   Moderate protein calorie malnutrition - In the context of chronic illness, malignancy - Seen by nutritionist   History of bilateral vocal cord paralysis, status post tracheostomy and bilateral injection laryngoplasty at [facility] 10/27/15  Abnormal LFTs: Possibly due to tamponad versus shock liver from sepsis  - per oncology, less likely due to chemotherapy, Improving -Improving. No GI symptoms reported  Left knee pain - Doppler ultrasound negative for DVT, uric acid normal, possibly due to gout/pseudogout, will give prednisone trial. If no significant improvement, will discuss with orthopedics.  Code Status: Full code  DVT Prophylaxis:  Lovenox    Family Communication: Discussed in detail with the patient, all imaging results, lab results explained to the patient  Disposition Plan: . If stable in a.m., ambulatory, will DC home tomorrow. Patient prefers to stay in stepdown unit then to transfer.  Time Spent in minutes  25 minutes  Consultants:   Hematology, Dr. Betsy Coder   Cardiothoracic surgery, Dr. Roxan Hockey  Procedures:   Pericardial Window 04/21/2016   ECHO 04/20/2016 - EF 60%;  Pericardium, extracardiac: A large, free-flowing pericardial effusion was identified circumferential to the heart. The fluid had no internal echoes. There was moderateright ventricular chamber collapse for more than 50% of the cardiac cycle. There was evidence for moderately increased RV-LV interaction demonstrated by respirophasic changes in transmitral velocities. Features were consistent with tamponade physiology.  Foley catheter   Antimicrobials:   Vanco 04/20/2016 -->  Cefepime 04/20/2016 -->    Medications  Scheduled Meds: . acetaminophen  1,000 mg Oral Q6H   Or  . acetaminophen (TYLENOL) oral liquid 160 mg/5 mL  1,000 mg Oral Q6H  . bisacodyl  10 mg Oral Daily  . ceFEPime (MAXIPIME) IV  1 g Intravenous Q8H  . enoxaparin (LOVENOX) injection  40 mg Subcutaneous Q24H  . mirtazapine  15 mg Oral QHS  . mupirocin ointment  1 application Nasal BID  . senna-docusate  1 tablet Oral QHS  . sodium chloride flush  10-40 mL Intracatheter Q12H  . vancomycin  1,000 mg Intravenous Q8H   Continuous Infusions: . 0.45 % NaCl with KCl 20 mEq / L 10 mL/hr at 04/24/16 0903   PRN Meds:.benzonatate, HYDROcodone-homatropine, HYDROmorphone (DILAUDID) injection, LORazepam, ondansetron (ZOFRAN) IV, oxyCODONE, potassium chloride, sodium chloride flush, zolpidem   Antibiotics   Anti-infectives    Start     Dose/Rate Route Frequency Ordered Stop   04/23/16 1335  vancomycin (VANCOCIN) IVPB 1000 mg/200 mL premix     1,000 mg 200 mL/hr over 60 Minutes Intravenous Every 8 hours 04/23/16 0812     04/21/16 0600  vancomycin (VANCOCIN) IVPB 1000 mg/200 mL premix  Status:  Discontinued     1,000 mg 200 mL/hr over 60 Minutes Intravenous Every 12 hours 04/20/16 1731 04/23/16 0812   04/20/16 1630  ceFEPIme (MAXIPIME) 1 g in dextrose 5 % 50 mL IVPB     1 g 100 mL/hr over 30 Minutes Intravenous Every 8 hours 04/20/16 1615 04/28/16 1759   04/20/16 1630  vancomycin (VANCOCIN) 1,250 mg in sodium chloride 0.9 %  250 mL IVPB     1,250 mg 166.7 mL/hr over 90 Minutes Intravenous  Once 04/20/16 1623 04/20/16 2019        Subjective:   Joel Osborne was seen and examined today. States left knee pain is improving. Feeling a lot better today.  Patient denies dizziness, chest pain, abdominal pain, N/V/D/C, new weakness, numbess, tingling.   Objective:   Filed Vitals:   04/24/16 0335 04/24/16 0737 04/24/16 0755 04/24/16 0816  BP: 107/65 98/79    Pulse: 96 109 99   Temp:    99 F (37.2 C)  TempSrc:    Oral  Resp: _0 Height:      Weight:      SpO2: 97% 95% 96%     Intake/Output Summary (Last 24 hours) at 04/24/16 1040 Last data filed at 04/24/16 1000  Gross per 24 hour  Intake 2952.08 ml  Output    451 ml  Net 2501.08 ml     Wt  Readings from Last 3 Encounters:  04/21/16 87.1 kg (192 lb 0.3 oz)  04/20/16 83.87 kg (184 lb 14.4 oz)  04/18/16 84.142 kg (185 lb 8 oz)     Exam  General: Alert and oriented x 3, NAD  HEENT:    Neck: Trach   Cardiovascular: S1 S2 clear, RRR  Respiratory: Diminished lung sounds at the bases   Gastrointestinal: Soft, nontender, nondistended, + bowel sounds  Ext: no cyanosis clubbing or edema  Neuro: no new deficits  Skin: No rashes  Psych: Normal affect and demeanor, alert and oriented x3    Data Reviewed:  I have personally reviewed following labs and imaging studies  Micro Results Recent Results (from the past 240 hour(s))  Culture, blood (routine x 2) Call MD if unable to obtain prior to antibiotics being given     Status: None (Preliminary result)   Collection Time: 04/20/16  4:37 PM  Result Value Ref Range Status   Specimen Description BLOOD LEFT ANTECUBITAL  Final   Special Requests BOTTLES DRAWN AEROBIC AND ANAEROBIC 5CC  Final   Culture   Final    NO GROWTH 3 DAYS Performed at [facility]    Report Status PENDING  Incomplete  Culture, blood (routine x 2) Call MD if unable to obtain prior to antibiotics being  given     Status: None (Preliminary result)   Collection Time: 04/20/16  4:37 PM  Result Value Ref Range Status   Specimen Description BLOOD RIGHT ARM  Final   Special Requests BOTTLES DRAWN AEROBIC AND ANAEROBIC 10CC  Final   Culture   Final    NO GROWTH 3 DAYS Performed at [facility]    Report Status PENDING  Incomplete  Culture, sputum-assessment     Status: None   Collection Time: 04/20/16  8:32 PM  Result Value Ref Range Status   Specimen Description SPU  Final   Special Requests NONE  Final   Sputum evaluation THIS SPECIMEN IS ACCEPTABLE FOR SPUTUM CULTURE  Final   Report Status 04/20/2016 FINAL  Final  Culture, respiratory (NON-Expectorated)     Status: None   Collection Time: 04/20/16  8:32 PM  Result Value Ref Range Status   Specimen Description SPUTUM  Final   Special Requests NONE  Final   Gram Stain   Final    MODERATE WBC PRESENT, PREDOMINANTLY PMN FEW SQUAMOUS EPITHELIAL CELLS PRESENT FEW GRAM NEGATIVE RODS FEW GRAM POSITIVE RODS FEW GRAM POSITIVE COCCI IN PAIRS IN CHAINS IN CLUSTERS THIS SPECIMEN IS ACCEPTABLE FOR SPUTUM CULTURE Performed at [facility]    Culture   Final    NORMAL OROPHARYNGEAL FLORA Performed at [facility]    Report Status 04/23/2016 FINAL  Final  Surgical PCR screen     Status: Abnormal   Collection Time: 04/21/16  1:00 AM  Result Value Ref Range Status   MRSA, PCR NEGATIVE NEGATIVE Final   Staphylococcus aureus POSITIVE (A) NEGATIVE Final    Comment:        The Xpert SA Assay (FDA approved for NASAL specimens in patients over 40 years of age), is one component of a comprehensive surveillance program.  Test performance has been validated by [facility] for patients greater than or equal to 76 year old. It is not intended to diagnose infection nor to guide or monitor treatment.   Culture, body fluid-bottle     Status: None (Preliminary result)   Collection Time: 04/21/16  8:21 AM  Result Value  Ref  Range Status   Specimen Description FLUID PERICARDIAL  Final   Special Requests NONE  Final   Culture NO GROWTH 2 DAYS  Final   Report Status PENDING  Incomplete  Gram stain     Status: None   Collection Time: 04/21/16  8:21 AM  Result Value Ref Range Status   Specimen Description FLUID PERICARDIAL  Final   Special Requests NONE  Final   Gram Stain   Final    MODERATE WBC PRESENT,BOTH PMN AND MONONUCLEAR NO ORGANISMS SEEN    Report Status 04/21/2016 FINAL  Final    Radiology Reports Ct Abdomen Pelvis Wo Contrast  04/20/2016  CLINICAL DATA:  History of esophageal carcinoma cough productive fevers chills, elevated liver enzymes EXAM: CT ABDOMEN AND PELVIS WITHOUT CONTRAST TECHNIQUE: Multidetector CT imaging of the abdomen and pelvis was performed following the standard protocol without IV contrast. COMPARISON:  04/20/2016 CT thorax FINDINGS: Small left and moderate right pleural effusion. Large pericardial effusion partially visualized. Gastric pull-up noted consistent with history of esophageal carcinoma. Partially visualized right lower lobe consolidation. Liver appears normal, as does the pancreas and as does spleen given limited evaluation without contrast. There are several small gallstones. No adrenal masses. No significant renal abnormalities. 1 cm cyst upper pole left kidney and 4 mm cyst lower pole right kidney. No hydronephrosis. IV contrast concentrated within the dependent portion of the bladder. Nonobstructive bowel gas pattern.  Small and large bowel normal. Reproductive organs normal. No significant free fluid. No significant vascular abnormalities. No significant adenopathy. No acute musculoskeletal findings. IMPRESSION: Partially visualized large pericardial effusion. Right lower lobe consolidation. Bilateral pleural effusions. Cholelithiasis. Electronically Signed   By: Skipper Cliche M.D.   On: 04/20/2016 19:49   Dg Chest 2 View  04/24/2016  CLINICAL DATA:  Shortness of  breath, weakness, esophageal malignancy, acute respiratory failure, tracheostomy patient. EXAM: CHEST  2 VIEW COMPARISON:  Portable chest x-ray of Apr 22, 2016 FINDINGS: The lungs are hypoinflated. There is bibasilar atelectasis. There small bilateral pleural effusions. The cardiac silhouette remains enlarged. The pulmonary vascularity is less engorged. The tracheostomy appliance tip projects between the clavicular heads. The power port appliance tip projects over the distal third of the SVC. IMPRESSION: Decreased pulmonary interstitial edema today. Persistent bibasilar atelectasis with small bilateral pleural effusions. No pneumothorax. The support tubes are in stable position. Electronically Signed   By: David  Martinique M.D.   On: 04/24/2016 07:30   Dg Chest 2 View  04/18/2016  CLINICAL DATA:  Esophageal cancer. Productive cough since Saturday. Shortness of breath. EXAM: CHEST  2 VIEW COMPARISON:  CT 02/07/2016 FINDINGS: Airspace opacity noted in both lower lobes concerning for pneumonia. Heart is mildly enlarged. Tracheostomy tube and right Port-A-Cath in place. No visible effusions. No acute bony abnormality. IMPRESSION: Bilateral lower lobe airspace opacities may reflect pneumonia. Mild cardiomegaly. Electronically Signed   By: Rolm Baptise M.D.   On: 04/18/2016 13:07   Ct Chest W Contrast  04/20/2016  CLINICAL DATA:  Healthcare associated pneumonia. EXAM: CT CHEST WITH CONTRAST TECHNIQUE: Multidetector CT imaging of the chest was performed during intravenous contrast administration. CONTRAST:  78m ISOVUE-300 IOPAMIDOL (ISOVUE-300) INJECTION 61% COMPARISON:  02/07/2016 FINDINGS: THORACIC INLET/BODY WALL: Porta catheter on the right with tip in good position at the upper cavoatrial junction. There is tracheostomy tube which is well seated. No thoracic inlet adenopathy. MEDIASTINUM: Normal heart size. New and massive pericardial effusion measuring up to 4 cm in thickness. There is smooth mild serosal  enhancement. Pleural fluid has developed fairly rapidly, new since 02/07/2016. Changes of gastric pull-through. There is ill-defined adenopathy in the right hilum with right middle lobe bronchus obstruction and narrowing of right lower lobe pulmonary artery. This has a malignant appearance. Hilar adenopathy on the left is milder. Isolated index precarinal node has heterogeneous low-density appearance. This node measures 10 mm short axis compared 8 mm previously. LUNG WINDOWS: Septal thickening on the right which is likely from lymphatic obstruction. This appears fairly smooth there is no definitive parenchymal lymphangitic tumor. Nearly collapsed right middle lobe. Lobulated pulmonary metastases have enlarged, left lower lobe 5:81 measuring up to 23 mm compared to 19 mm previously. A lingular lobulated metastasis measures 14 mm compared to 11 previously. Small layering pleural effusions. UPPER ABDOMEN: Cholelithiasis. There is mild haziness of fat around the gallbladder but no over distention or definitive cholecystitis. Left diaphragmatic hernia containing colon and pancreatic tail. OSSEOUS: No acute fracture.  No suspicious lytic or blastic lesions. These results were called by telephone at the time of interpretation on 04/20/2016 at 7:37 pm to Dr. Baltazar Najjar who verbally acknowledged these results. IMPRESSION: 1. Massive low-density pericardial effusion that has developed since 02/07/2016. No pericardial mass noted. 2. 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 3. Right middle lobe airway obstruction and collapse due to right hilar adenopathy. Right lung edema attributed to malignant lymphatic obstruction. Electronically Signed   By: Monte Fantasia M.D.   On: 04/20/2016 19:38   Dg Chest Port 1 View  04/22/2016  CLINICAL DATA:  Evaluate effusion. EXAM: PORTABLE CHEST 1 VIEW COMPARISON:  Apr 18, 2016, CT scan Apr 20, 2016, and chest x-ray Apr 21, 2016 FINDINGS: A tracheostomy tube and  right Port-A-Cath are stable. No pneumothorax. There is rounded opacity projected over the right hilum, mildly increased in the interval. This was not seen on Apr 18, 2016 and there was atelectasis in this region on the recent CT scan. Adenopathy was also seen in the right hilum on the recent CT scan. Layering bilateral effusions are again seen. Underlying compressive atelectasis. No change in the cardiomediastinal silhouette. Suggested mild edema. IMPRESSION: 1. Increasing rounded opacity projected over the right infrahilar region correlates with adenopathy and atelectasis on a recent CT scan. 2. Mildly worsened layering effusions, right greater than left. 3. Mild edema. Electronically Signed   By: Dorise Bullion III M.D   On: 04/22/2016 07:24   Dg Chest Port 1 View  04/21/2016  CLINICAL DATA:  Postop chest, pericardial effusion EXAM: PORTABLE CHEST 1 VIEW COMPARISON:  CT chest dated 04/20/2016 FINDINGS: Patient is rotated. Tracheostomy in satisfactory position. The heart is top-normal in size. Mild perihilar edema. Small left pleural effusion. Mild right infrahilar opacity, likely atelectasis. Right chest port terminates the cavoatrial junction. IMPRESSION: Mild perihilar edema with small left pleural effusion. Electronically Signed   By: Julian Hy M.D.   On: 04/21/2016 09:43    Lab Data:  CBC:  Recent Labs Lab 04/20/16 1640 04/21/16 0440 04/22/16 0307 04/23/16 0530 04/24/16 0500  WBC 13.5* 15.4* 7.6 7.0 6.7  NEUTROABS 11.2* 12.5*  --   --   --   HGB 12.7* 11.8* 10.4* 10.8* 11.8*  HCT 38.2* 37.3* 33.2* 33.4* 37.1*  MCV 85.8 88.4 89.0 88.1 87.3  PLT 297 219 153 139* 341*   Basic Metabolic Panel:  Recent Labs Lab 04/20/16 1640 04/21/16 0440 04/22/16 0307 04/24/16 0500  NA 137 139 138 135  K 5.0 4.7 3.6 3.9  CL 99* 104  107 105  CO2 _0 GLUCOSE 184* 158* 88 117*  BUN 31* 28* 19 <5*  CREATININE 1.25* 1.07 0.71 0.51*  CALCIUM 8.9 8.5* 7.6* 7.8*   GFR: Estimated  Creatinine Clearance: 119.9 mL/min (by C-G formula based on Cr of 0.51). Liver Function Tests:  Recent Labs Lab 04/20/16 1640 04/21/16 0440 04/22/16 0307 04/23/16 0530 04/24/16 0500  AST 898* 2014* 525* 143* 103*  ALT 918* 1872* 1035* 629* 512*  ALKPHOS 145* 126 90 81 93  BILITOT 2.1* 1.8* 1.0 1.0 0.9  PROT 7.1 6.2* 4.7* 4.7* 5.6*  ALBUMIN 3.5 2.9* 2.1* 2.0* 2.2*   No results for input(s): LIPASE, AMYLASE in the last 168 hours. No results for input(s): AMMONIA in the last 168 hours. Coagulation Profile:  Recent Labs Lab 04/20/16 1827  INR 2.05*   Cardiac Enzymes: No results for input(s): CKTOTAL, CKMB, CKMBINDEX, TROPONINI in the last 168 hours. BNP (last 3 results) No results for input(s): PROBNP in the last 8760 hours. HbA1C: No results for input(s): HGBA1C in the last 72 hours. CBG:  Recent Labs Lab 04/21/16 0549 04/21/16 0911 04/21/16 1138 04/21/16 1509  GLUCAP 142* 149* 109* 108*   Lipid Profile: No results for input(s): CHOL, HDL, LDLCALC, TRIG, CHOLHDL, LDLDIRECT in the last 72 hours. Thyroid Function Tests: No results for input(s): TSH, T4TOTAL, FREET4, T3FREE, THYROIDAB in the last 72 hours. Anemia Panel: No results for input(s): VITAMINB12, FOLATE, FERRITIN, TIBC, IRON, RETICCTPCT in the last 72 hours. Urine analysis:    Component Value Date/Time   COLORURINE AMBER* 04/20/2016 2033   APPEARANCEUR CLEAR 04/20/2016 2033   LABSPEC 1.042* 04/20/2016 2033   PHURINE 5.5 04/20/2016 2033   GLUCOSEU NEGATIVE 04/20/2016 2033   HGBUR NEGATIVE 04/20/2016 2033   BILIRUBINUR SMALL* 04/20/2016 2033   KETONESUR NEGATIVE 04/20/2016 2033   PROTEINUR NEGATIVE 04/20/2016 2033   UROBILINOGEN 0.2 07/03/2013 1000   NITRITE NEGATIVE 04/20/2016 2033   LEUKOCYTESUR NEGATIVE 04/20/2016 2033     RAI,RIPUDEEP M.D. [facility] 04/24/2016, 10:40 AM  Pager: [phone] Between 7am to 7pm - call Pager - [phone]  After 7pm go to [email or web address] - password  [email or web address]  Call night coverage person covering after 7pm

## 2016-04-25 ENCOUNTER — Inpatient Hospital Stay (HOSPITAL_COMMUNITY): Payer: BLUE CROSS/BLUE SHIELD

## 2016-04-25 ENCOUNTER — Other Ambulatory Visit: Payer: Self-pay | Admitting: *Deleted

## 2016-04-25 DIAGNOSIS — I480 Paroxysmal atrial fibrillation: Secondary | ICD-10-CM

## 2016-04-25 DIAGNOSIS — I4891 Unspecified atrial fibrillation: Secondary | ICD-10-CM | POA: Diagnosis present

## 2016-04-25 DIAGNOSIS — I319 Disease of pericardium, unspecified: Secondary | ICD-10-CM

## 2016-04-25 LAB — ECHOCARDIOGRAM LIMITED
HEIGHTINCHES: 72 in
Weight: 3072.33 oz

## 2016-04-25 LAB — COMPREHENSIVE METABOLIC PANEL
ALBUMIN: 2 g/dL — AB (ref 3.5–5.0)
ALK PHOS: 95 U/L (ref 38–126)
ALT: 365 U/L — ABNORMAL HIGH (ref 17–63)
ANION GAP: 8 (ref 5–15)
AST: 81 U/L — ABNORMAL HIGH (ref 15–41)
BUN: 5 mg/dL — ABNORMAL LOW (ref 6–20)
CHLORIDE: 106 mmol/L (ref 101–111)
CO2: 26 mmol/L (ref 22–32)
Calcium: 8.3 mg/dL — ABNORMAL LOW (ref 8.9–10.3)
Creatinine, Ser: 0.52 mg/dL — ABNORMAL LOW (ref 0.61–1.24)
GFR calc Af Amer: >60 mL/min (ref >60)
GFR calc non Af Amer: >60 mL/min (ref >60)
GLUCOSE: 133 mg/dL — AB (ref 65–99)
POTASSIUM: 4.4 mmol/L (ref 3.5–5.1)
SODIUM: 140 mmol/L (ref 135–145)
Total Bilirubin: 0.7 mg/dL (ref 0.3–1.2)
Total Protein: 5 g/dL — ABNORMAL LOW (ref 6.5–8.1)

## 2016-04-25 LAB — CBC
HEMATOCRIT: 35.2 % — AB (ref 39.0–52.0)
Hemoglobin: 11.2 g/dL — ABNORMAL LOW (ref 13.0–17.0)
MCH: 27.9 pg (ref 26.0–34.0)
MCHC: 31.8 g/dL (ref 30.0–36.0)
MCV: 87.6 fL (ref 78.0–100.0)
PLATELETS: 178 10*3/uL (ref 150–400)
RBC: 4.02 MIL/uL — AB (ref 4.22–5.81)
RDW: 14 % (ref 11.5–15.5)
WBC: 9.8 10*3/uL (ref 4.0–10.5)

## 2016-04-25 LAB — CULTURE, BLOOD (ROUTINE X 2)
CULTURE: NO GROWTH
Culture: NO GROWTH

## 2016-04-25 MED ORDER — AMOXICILLIN-POT CLAVULANATE 875-125 MG PO TABS
1.0000 | ORAL_TABLET | Freq: Two times a day (BID) | ORAL | Status: DC
Start: 2016-04-25 — End: 2016-04-26
  Administered 2016-04-25: 1 via ORAL
  Filled 2016-04-25 (×3): qty 1

## 2016-04-25 NOTE — Progress Notes (Signed)
*  PRELIMINARY RESULTS* Echocardiogram 2D Echocardiogram limited has been performed.  Leavy Cella 04/25/2016, 4:50 PM

## 2016-04-25 NOTE — Progress Notes (Signed)
Visited patients room to perform trach ck.  Lurline Idol is secure and in place no issues to report at this time.  Pt suctioning himself and has a great cough as well.  Advised patient if he needs assistance with trach sx etc to have Rn call RT.  Will continue to monitor throughout the night.

## 2016-04-25 NOTE — Progress Notes (Addendum)
CCMD notified nurse that pt had converted to AFIB with HR in the 150's. Pt is currently sleeping and resting comfortably. Nurse notified K. Schorr.

## 2016-04-25 NOTE — Progress Notes (Signed)
Patient ID: Joel Osborne, male   DOB: [birth year], 50 y.o.   MRN: [id number]      [address]       [place] [address]             [phone]                 4 Days Post-Op Procedure(s) (LRB): SUBXYPHOID PERICARDIAL WINDOW (N/A)  LOS: 5 days   Subjective: Wants to go home today, rapid afib last pm, sinus now  Objective: Vital signs in last 24 hours: Patient Vitals for the past 24 hrs:  BP Temp Temp src Pulse Resp SpO2  04/25/16 1209 99/65 mmHg - - 92 16 94 %  04/25/16 1148 99/65 mmHg 98.8 F (37.1 C) Oral 93 20 96 %  04/25/16 0819 (!) 89/55 mmHg - - 86 20 95 %  04/25/16 0717 (!) 89/59 mmHg 97.9 F (36.6 C) Oral 90 17 93 %  04/25/16 0445 107/69 mmHg - - 89 (!) 23 97 %  04/25/16 0427 - - - 86 18 94 %  04/25/16 0421 - 97.9 F (36.6 C) Oral - - -  04/25/16 0102 - - - 98 18 96 %  04/24/16 2335 106/68 mmHg - - (!) 102 (!) 26 95 %  04/24/16 2327 - 98.1 F (36.7 C) Oral - - -  04/24/16 2105 - - - 97 18 97 %  04/24/16 1925 - 98.6 F (37 C) Oral - - -  04/24/16 1912 103/69 mmHg - - 93 - 96 %  04/24/16 1531 - - - (!) 110 (!) 26 96 %  04/24/16 1459 113/73 mmHg 98.3 F (36.8 C) Oral (!) 101 (!) 29 92 %    Filed Weights   04/20/16 1558 04/21/16 0050  Weight: 83.87 kg (184 lb 14.4 oz) 87.1 kg (192 lb 0.3 oz)    Hemodynamic parameters for last 24 hours:    Intake/Output from previous day: 05/15 0701 - 05/16 0700 In: 1793.3 [P.O.:720; I.V.:323.3; IV Piggyback:750] Out: 1950 [Urine:1950] Intake/Output this shift:    Scheduled Meds: . acetaminophen  1,000 mg Oral Q6H   Or  . acetaminophen (TYLENOL) oral liquid 160 mg/5 mL  1,000 mg Oral Q6H  . amoxicillin-clavulanate  1 tablet Oral Q12H  . bisacodyl  10 mg Oral Daily  . enoxaparin (LOVENOX) injection  40 mg Subcutaneous Q24H  . mirtazapine  15 mg Oral QHS  . mupirocin ointment  1 application Nasal BID  . predniSONE  40 mg Oral Q breakfast  . senna-docusate  1 tablet Oral QHS  . sodium chloride  flush  10-40 mL Intracatheter Q12H   Continuous Infusions: . 0.45 % NaCl with KCl 20 mEq / L 10 mL/hr at 04/24/16 2125   PRN Meds:.benzonatate, HYDROcodone-homatropine, HYDROmorphone (DILAUDID) injection, LORazepam, ondansetron (ZOFRAN) IV, oxyCODONE, potassium chloride, sodium chloride flush, zolpidem Trach in place General appearance: alert, cooperative and no distress Neurologic: intact Heart: regular rate and rhythm, S1, S2 normal, no murmur, click, rub or gallop Lungs: diminished breath sounds bibasilar Abdomen: soft, non-tender; bowel sounds normal; no masses,  no organomegaly Extremities: extremities normal, atraumatic, no cyanosis or edema and Homans sign is negative, no sign of DVT  Lab Results: CBC: Recent Labs  04/24/16 0500 04/25/16 0438  WBC 6.7 9.8  HGB 11.8* 11.2*  HCT 37.1* 35.2*  PLT 138* 178   BMET:  Recent Labs  04/24/16 0500 04/25/16 0438  NA 135 140  K 3.9 4.4  CL 105 106  CO2 25 26  GLUCOSE 117* 133*  BUN <5* 5*  CREATININE 0.51* 0.52*  CALCIUM 7.8* 8.3*    PT/INR: No results for input(s): LABPROT, INR in the last 72 hours.   Radiology Dg Chest 2 View  04/24/2016  CLINICAL DATA:  Shortness of breath, weakness, esophageal malignancy, acute respiratory failure, tracheostomy patient. EXAM: CHEST  2 VIEW COMPARISON:  Portable chest x-ray of Apr 22, 2016 FINDINGS: The lungs are hypoinflated. There is bibasilar atelectasis. There small bilateral pleural effusions. The cardiac silhouette remains enlarged. The pulmonary vascularity is less engorged. The tracheostomy appliance tip projects between the clavicular heads. The power port appliance tip projects over the distal third of the SVC. IMPRESSION: Decreased pulmonary interstitial edema today. Persistent bibasilar atelectasis with small bilateral pleural effusions. No pneumothorax. The support tubes are in stable position. Electronically Signed   By: David  Martinique M.D.   On: 04/24/2016 07:30   Dg Knee  Complete 4 Views Left  04/24/2016  CLINICAL DATA:  Anterior left knee pain when bearing weight. No known injuries. EXAM: LEFT KNEE - COMPLETE 4+ VIEW COMPARISON:  None. FINDINGS: Irregular periosteal reaction involving the anterior, lateral and medial (predominantly anterior) aspect of the distal femoral metaphysis. No evidence of acute or subacute fracture or dislocation. Joint spaces well preserved. Bone mineral density well-preserved. No visible joint effusion. IMPRESSION: Periostitis involving the distal femur. Differential diagnosis might include osteomyelitis, neoplasm, and hypertrophic pulmonary osteoarthropathy. Electronically Signed   By: Evangeline Dakin M.D.   On: 04/24/2016 11:11     Assessment/Plan: S/P Procedure(s) (LRB): SUBXYPHOID PERICARDIAL WINDOW (N/A) Mobilize Home soon pending decision by cardiology , repeat echo, patient wants to go home today path still pending on cytology of pericardial fluid   [name] 04/25/2016 12:39 PM

## 2016-04-25 NOTE — Evaluation (Signed)
Physical Therapy Evaluation Patient Details Name: Joel Osborne MRN: [id number] DOB: 1966-02-08 Today's Date: 04/25/2016   History of Present Illness  50 y.o. male with a past medical history significant for adenocarcinoma of the distal esophagus/gastric cardia, diagnosed in 2014, s/p esophagectomy by Dr. Servando Snare.  Patient was admitted with acute respiratory failure and found to have pericaridal effuction due to obstruction from malignant lymph node and is s/p pericardial window on 04/21/16.  Clinical Impression  Patient presents with decreased mobility due to deficits listed in PT problem list.  He will benefit from skilled PT in the acute setting to allow return home with intermittent family support.  No current follow up needs.  Have discussed importance of lighting and foot wear for safety due to neuropathy.      Follow Up Recommendations No PT follow up    Equipment Recommendations  None recommended by PT    Recommendations for Other Services       Precautions / Restrictions Precautions Precaution Comments: trach on trach collar      Mobility  Bed Mobility Overal bed mobility: Modified Independent                Transfers Overall transfer level: Modified independent Equipment used: None                Ambulation/Gait Ambulation/Gait assistance: Supervision;Min guard Ambulation Distance (Feet): 200 Feet Assistive device: None Gait Pattern/deviations: Step-through pattern;Wide base of support     General Gait Details: increased lateray sway with wider BOS, no LOB  Stairs            Wheelchair Mobility    Modified Rankin (Stroke Patients Only)       Balance Overall balance assessment: No apparent balance deficits (not formally assessed)                                           Pertinent Vitals/Pain Pain Assessment: No/denies pain    Home Living Family/patient expects to be discharged to:: Private residence Living  Arrangements: Spouse/significant other Available Help at Discharge: Family Type of Home: House Home Access: Stairs to enter   Technical brewer of Steps: 2 Home Layout: Two level;Bed/bath upstairs Home Equipment: None      Prior Function Level of Independence: Independent               Hand Dominance        Extremity/Trunk Assessment   Upper Extremity Assessment: Overall WFL for tasks assessed           Lower Extremity Assessment: RLE deficits/detail;LLE deficits/detail RLE Deficits / Details: AROM and strength WFL LLE Deficits / Details: AROM and strength WFL  Cervical / Trunk Assessment: Kyphotic  Communication   Communication: No difficulties  Cognition Arousal/Alertness: Awake/alert Behavior During Therapy: Flat affect Overall Cognitive Status: Within Functional Limits for tasks assessed                      General Comments General comments (skin integrity, edema, etc.): patient with violent cough productive of sputum throughout session.   Had just awoken and reports normal for him to have lots of secretions in the morning.    Exercises        Assessment/Plan    PT Assessment Patient needs continued PT services  PT Diagnosis Generalized weakness   PT Problem List Decreased balance;Decreased activity tolerance  PT Treatment Interventions Gait training;Stair training;Balance training;Functional mobility training;Patient/family education;Therapeutic activities   PT Goals (Current goals can be found in the Care Plan section) Acute Rehab PT Goals Patient Stated Goal: To go home today PT Goal Formulation: With patient Time For Goal Achievement: 04/28/16 Potential to Achieve Goals: Good    Frequency Min 3X/week   Barriers to discharge        Co-evaluation               End of Session Equipment Utilized During Treatment: Gait belt;Oxygen Activity Tolerance: Patient tolerated treatment well Patient left: in chair;with call  bell/phone within reach           Time: 5790-3833 PT Time Calculation (min) (ACUTE ONLY): 19 min   Charges:   PT Evaluation $PT Eval Moderate Complexity: 1 Procedure     PT G Codes:        Reginia Naas 29-Apr-2016, 12:00 PM  Magda Kiel, [place] 04-29-16

## 2016-04-25 NOTE — Progress Notes (Signed)
Pt converted back to sinus rhythm with Hr in the 60's. K. Schorr responded to page and no new orders received at the time. Will notify if pt converts back to Afib and sustains.

## 2016-04-25 NOTE — Consult Note (Signed)
Reason for Consult:   Atrial fibrilation  Requesting Physician: Dr Tana Coast Primary Cardiologist New  HPI:   50 y.o. male with a past medical history significant for adenocarcinoma of the distal esophagus diagnosed in 2014, s/p esophagectomy by Dr. Servando Snare. 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   Patient presented 04/20/16 with progressively worsening cough associated with clear/yellow sputum production, subjective fevers, chills, generalized weakness, malaise, fatigue, poor tolerance to physical exertion. Chest x-ray performed on 04/18/2016 showed bilateral lower lobe airspace opacities consistent with pneumonia and an enlarged cardiac silouette. He was started on Levaquin but did not improve.   CT chest showed a large pericardial effusion and progression of his metastatic disease. ECHO confirmed large pleural effusion and findings consistent with tamponade physiology. The patient was transferred to [facility], status post subxiphoid pericardial window on 04/21/16.  He has been recovering and was actually being considered for discharge today.  Early this am he began having PAF. He is currently in NSR -92, (he received no treatment for AF). He denies any prior history of PAF.   PMHx:  Past Medical History  Diagnosis Date  . Hyperlipemia   . Colon polyp   . Pre-diabetes   . GERD (gastroesophageal reflux disease)   . History of radiation therapy 04/07/13-05-15-13    Esopageal ca,50.4Gy/27f and again [place]  . Sleep apnea     no c-pap at this time  . Anxiety   . Diabetes mellitus without complication [place] [place]     patient denies.  . [place]    Adenocarcinoma  . Diverticulosis     Past Surgical History  Procedure Laterality Date  . Appendectomy    . Lipoma excision    . Edg  03/18/2013    Esophageal Mass  .  Complete esophagectomy N/A 07/07/2013    Procedure: ESOPHAGECTOMY COMPLETE;  Surgeon: EGrace Isaac MD;  [place]  Service: Thoracic;  Laterality: N/A;  transhiatel esophagectomy, feeding jejunostomy  . Video bronchoscopy N/A 07/07/2013    Procedure: VIDEO BRONCHOSCOPY;  Surgeon: EGrace Isaac MD;  [place]  Service: Thoracic;  Laterality: N/A;  . Eus N/A 06/25/2014    Procedure: UPPER ENDOSCOPIC ULTRASOUND (EUS) LINEAR;  Surgeon: DMilus Banister MD;  [facility];  Service: Endoscopy;  Laterality: N/A;  . Esophagogastroduodenoscopy N/A 07/02/2014    Procedure: ESOPHAGOGASTRODUODENOSCOPY (EGD);  Surgeon: CGatha Mayer MD;  [facility];  Service: Endoscopy;  Laterality: N/A;  . Vocal chord surgery      pushed right vocal cord more centered- September 2015  . Esophagogastroduodenoscopy N/A 02/16/2015    Procedure: ESOPHAGOGASTRODUODENOSCOPY (EGD);  Surgeon: DLafayette Dragon MD;  [facility];  Service: Endoscopy;  Laterality: N/A;  . Upper gastrointestinal endoscopy    . Colonoscopy    . Esophagogastroduodenoscopy N/A 09/29/2015    Procedure: ESOPHAGOGASTRODUODENOSCOPY (EGD);  Surgeon: MLadene Artist MD;  [facility];  Service: Endoscopy;  Laterality: N/A;  . Subxyphoid pericardial window N/A 04/21/2016    Procedure: SUBXYPHOID PERICARDIAL WINDOW;  Surgeon: SMelrose Nakayama MD;  [place]  Service: Thoracic;  Laterality: N/A;    SOCHx:  reports that he has never smoked. He has never used smokeless tobacco. He reports that he drinks about 1.2 oz of alcohol per week. He reports that  he does not use illicit drugs.  FAMHx: Family History  Problem Relation Age of Onset  . Colon cancer Mother     dx in her 75s  . Diabetes Mother   . Breast cancer Maternal Aunt 81  . Diabetes Maternal Aunt   . Esophageal cancer Maternal Aunt 78  . Kidney failure Brother 43  . Stomach cancer Neg Hx   . Rectal cancer Neg Hx   .  Hypertension Brother 13    ALLERGIES: Allergies  Allergen Reactions  . Lactose Intolerance (Gi) Other (See Comments)    Upset stomach     ROS: Review of Systems: General: See H&P  Cardiovascular: negative for chest pain, edema, orthopnea HEENT: negative for any visual disturbances, blindness, glaucoma Dermatological: negative for rash Respiratory: See H&P Urologic: negative for hematuria or dysuria Abdominal: negative for nausea, vomiting, diarrhea, bright red blood per rectum, melena, or hematemesis Neurologic: negative for visual changes, syncope, or dizziness Musculoskeletal: negative for back pain, joint pain, or swelling Psych: cooperative and appropriate All other systems reviewed and are otherwise negative except as noted above.   HOME MEDICATIONS: Prior to Admission medications   Medication Sig Start Date End Date Taking? Authorizing Provider  acetaminophen (TYLENOL) 500 MG tablet Take 1,000 mg by mouth every 6 (six) hours as needed for mild pain or moderate pain.   Yes Historical Provider, MD  anti-nausea (EMETROL) solution Take 10 mLs by mouth every 15 (fifteen) minutes as needed for nausea or vomiting.   Yes Historical Provider, MD  brompheniramine-pseudoephedrine (DIMETAPP) 1-15 MG/5ML ELIX Take by mouth 2 (two) times daily as needed for allergies.   Yes Historical Provider, MD  eszopiclone (LUNESTA) 1 MG TABS tablet TAKE 1-2 TABLETS BY MOUTH AT BEDTIME AS NEEDED FOR SLEEP 02/22/16  Yes Ladell Pier, MD  guaiFENesin (ROBITUSSIN) 100 MG/5ML liquid Take 200 mg by mouth 3 (three) times daily as needed for cough.   Yes Historical Provider, MD  ibuprofen (ADVIL,MOTRIN) 200 MG tablet Take 400 mg by mouth every 6 (six) hours as needed for moderate pain (soreness).   Yes Historical Provider, MD  lactase (LACTAID) 3000 UNITS tablet Take 3,000 Units by mouth daily as needed (lactose intolerance). For lactose intolerance   Yes Historical Provider, MD  levofloxacin (LEVAQUIN) 25  MG/ML solution Take 20 mLs (500 mg total) by mouth daily. 04/18/16  Yes Owens Shark, NP  lidocaine-prilocaine (EMLA) cream Apply 1 application topically as needed. Apply tablespoon to PAC 2 hours prior to stick and cover with plastic wrap 11/09/15  Yes Ladell Pier, MD  mirtazapine (REMERON) 15 MG tablet TAKE ONE TABLET BY MOUTH AT BEDTIME 02/16/16  Yes Ladell Pier, MD  Multiple Vitamin (MULTIVITAMIN WITH MINERALS) TABS tablet Take 1 tablet by mouth daily.   Yes Historical Provider, MD  dextromethorphan-guaiFENesin (MUCINEX DM) 30-600 MG 12hr tablet Take 1 tablet by mouth 2 (two) times daily. Reported on 04/20/2016    Historical Provider, MD  OVER THE COUNTER MEDICATION Take 28 mg by mouth 2 (two) times daily. Hemp Extract    Historical Provider, MD    HOSPITAL MEDICATIONS: I have reviewed the patient's current medications.  VITALS: Blood pressure 89/55, pulse 86, temperature 97.9 F (36.6 C), temperature source Oral, resp. rate 20, height 6' (1.829 m), weight 192 lb 0.3 oz (87.1 kg), SpO2 95 %.  PHYSICAL EXAM: General appearance: alert, cooperative and no distress Neck: no carotid bruit, no JVD and tracheostomy in place Lungs: decreased, scattered rhonchi Heart: irregularly irregular rhythm  and no rub Abdomen: soft, non-tender; bowel sounds normal; no masses,  no organomegaly Extremities: no edema Skin: cool, pale, dry Neurologic: Grossly normal  LABS: Results for orders placed or performed during the hospital encounter of 04/20/16 (from the past 24 hour(s))  CBC     Status: Abnormal   Collection Time: 04/25/16  4:38 AM  Result Value Ref Range   WBC 9.8 4.0 - 10.5 K/uL   RBC 4.02 (L) 4.22 - 5.81 MIL/uL   Hemoglobin 11.2 (L) 13.0 - 17.0 g/dL   HCT 35.2 (L) 39.0 - 52.0 %   MCV 87.6 78.0 - 100.0 fL   MCH 27.9 26.0 - 34.0 pg   MCHC 31.8 30.0 - 36.0 g/dL   RDW 14.0 11.5 - 15.5 %   Platelets 178 150 - 400 K/uL  Comprehensive metabolic panel     Status: Abnormal   Collection  Time: 04/25/16  4:38 AM  Result Value Ref Range   Sodium 140 135 - 145 mmol/L   Potassium 4.4 3.5 - 5.1 mmol/L   Chloride 106 101 - 111 mmol/L   CO2 26 22 - 32 mmol/L   Glucose, Bld 133 (H) 65 - 99 mg/dL   BUN 5 (L) 6 - 20 mg/dL   Creatinine, Ser 0.52 (L) 0.61 - 1.24 mg/dL   Calcium 8.3 (L) 8.9 - 10.3 mg/dL   Total Protein 5.0 (L) 6.5 - 8.1 g/dL   Albumin 2.0 (L) 3.5 - 5.0 g/dL   AST 81 (H) 15 - 41 U/L   ALT 365 (H) 17 - 63 U/L   Alkaline Phosphatase 95 38 - 126 U/L   Total Bilirubin 0.7 0.3 - 1.2 mg/dL   GFR calc non Af Amer >60 >60 mL/min   GFR calc Af Amer >60 >60 mL/min   Anion gap 8 5 - 15    EKG: NSR on 5/11 with low voltage  IMAGING: Dg Chest 2 View  04/24/2016  CLINICAL DATA:  Shortness of breath, weakness, esophageal malignancy, acute respiratory failure, tracheostomy patient. EXAM: CHEST  2 VIEW COMPARISON:  Portable chest x-ray of Apr 22, 2016 FINDINGS: The lungs are hypoinflated. There is bibasilar atelectasis. There small bilateral pleural effusions. The cardiac silhouette remains enlarged. The pulmonary vascularity is less engorged. The tracheostomy appliance tip projects between the clavicular heads. The power port appliance tip projects over the distal third of the SVC. IMPRESSION: Decreased pulmonary interstitial edema today. Persistent bibasilar atelectasis with small bilateral pleural effusions. No pneumothorax. The support tubes are in stable position. Electronically Signed   By: David  Martinique M.D.   On: 04/24/2016 07:30   Dg Knee Complete 4 Views Left  04/24/2016  CLINICAL DATA:  Anterior left knee pain when bearing weight. No known injuries. EXAM: LEFT KNEE - COMPLETE 4+ VIEW COMPARISON:  None. FINDINGS: Irregular periosteal reaction involving the anterior, lateral and medial (predominantly anterior) aspect of the distal femoral metaphysis. No evidence of acute or subacute fracture or dislocation. Joint spaces well preserved. Bone mineral density well-preserved. No  visible joint effusion. IMPRESSION: Periostitis involving the distal femur. Differential diagnosis might include osteomyelitis, neoplasm, and hypertrophic pulmonary osteoarthropathy. Electronically Signed   By: Evangeline Dakin M.D.   On: 04/24/2016 11:11    IMPRESSION: Principal Problem:   Acute respiratory failure with hypoxia (HCC) Active Problems:   HCAP (healthcare-associated pneumonia)   Pericardial effusion with cardiac tamponade   Atrial fibrillation, new onset ([place])   Cancer of distal third of esophagus (HCC)   Anemia of chronic disease  Elevated liver enzymes   Leukocytosis   Hilar adenopathy   Lymphatic obstruction   Encounter for chest tube removal   RECOMMENDATION: Low dose beta blocker if his B/P will tolerate (currently systolic B/P in the 41'U and he is in NSR so I did not order). Will discuss if echo needed before discharge with MD.  Time Spent Directly with Patient: 40 minutes  Kerin Ransom, [place] beeper 04/25/2016, 11:33 AM   I have seen and examined the patient along with Kerin Ransom, [place] .  I have reviewed the chart, notes and new data.  I agree with PA's note.  Key new complaints: weak and uncomfortable, but wants to go home Key examination changes: tachycardic, no rub, clear lungs Key new findings / data: follow up echo shows a small to moderate residual effusion without tamponade  PLAN: Brief paroxysmal atrial fibrillation related to pericarditis. This could be simply traumatic pericarditis related to the surgery, but may also be neoplastic pericarditis (cytology pending). Embolic risk is low: CHADSVasc score is zero.  I do not think he will tolerate beta blockers due to low BP. Recommend outpatient event monitor to look for arrhythmia recurrence (if simply a postop arrhythmia, should be less likely to occur as time goes by; if neoplastic, anticipate increasing frequency, in which case may need to consider antiarrhythmic therapy, for symptom  palliation).  Sanda Klein, MD, [place] [phone] 04/25/2016, 6:12 PM

## 2016-04-25 NOTE — Progress Notes (Signed)
[facility]                                                                              Patient Demographics  Joel Osborne, is a 50 y.o. male, DOB - [birth year], FHQ:[id number]  Admit date - 04/20/2016   Admitting Physician Kelvin Cellar, MD  Outpatient Primary MD for the patient is [name], MD  Outpatient specialists:   LOS - 5  days    No chief complaint on file.      Brief summary   50 y.o. male with a past medical history significant for adenocarcinoma of the distal esophagus/gastric cardia, diagnosed in 2014, s/p esophagectomy by Dr. Servando Snare. 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   Patient presented with progressively worsening cough associated with clear/yellow sputum production, subjective fevers, chills, generalized weakness, malaise, fatigue, poor tolerance to physical exertion. Chest x-ray performed on 04/18/2016 showed bilateral lower lobe airspace opacities consistent with pneumonia and an enlarged cardiac silouette. He was started on Levaquin but did not improve.   CT chest showed a large pericardial effusion and progression of his metastatic disease. ECHO confirmed large pleural effusion and findings consistent with tamponade physiology. The patient was transferred to [facility], status post subxiphoid pericardial window on 04/21/16. Overnight 5/16 a.m., patient went into rapid atrial fibrillation with heart rate in 150s   Assessment & Plan   Principal Problems:  Acute respiratory failure with hypoxia (HCC) / HCAP (healthcare-associated pneumonia) / Right middle lobe airway obstruction and collapse due to right hilar adenopathy / Right lung edema secondary to malignant lymphatic obstruction - Stable resp status, O2 sats 95%, has trach  - Aggressive pulmonary toilet. - Discontinued iV vancomycin and cefepime,  transitioned to oral Augmentin    Pericardial effusion with cardiac tamponade - S/P pericardial window on 5/12 - Management per thoracic surgery. Chest tube out.  Atrial fibrillation with RVR new diagnosis, paroxysmal Now converted back in normal sinus rhythm - Cardiology consult called, patient had a 2-D echo on 5/11 which showed EF of 60% however had shown large pericardial effusion. ? Repeat 2-D echo, Due to large cardiac tamponade and pericardial window, may not be a candidate for anticoagulation, will await cardiology recommendations.   Sepsis due to pneumonia ([place]) / Leukocytosis-resolved - Sepsis criteria met with fever, tachycardia, tachypnea, hypoxia (O2 saturation 92% on [place] oxygen) support), leukocytosis, lactic acidosis. Sepsis physiology resolved - Presumed source of infections HCAP, discontinued IV vancomycin, cefepime (received 5 days), started on Augmentin - Blood cultures 2: Negative to date. HIV non reactive  - Strep pneumonia ag is negative, Resp culture with normal flora    Adenocarcinoma of distal third of esophagus/gastric cardia [facility]) - Appreciate Dr. Benay Spice following, will follow outpatient on 5/22. - Last chemo 04/07/2016   Anemia of chronic disease - Due to sequela of chemotherapy  - Currently stable   Moderate protein calorie malnutrition - In the context of chronic illness, malignancy - Seen by nutritionist   History of bilateral vocal cord paralysis, status post tracheostomy and bilateral  injection laryngoplasty at [facility] 10/27/15  Abnormal LFTs: Possibly due to tamponad versus shock liver from sepsis  - per oncology, less likely due to chemotherapy, Improving -Improving. No GI symptoms reported  Left knee pain-improving  - Doppler ultrasound negative for DVT, uric acid normal, possibly due to gout/pseudogout, will continue prednisone trial, taper quickly .  Code Status: Full code  DVT Prophylaxis:  Lovenox    Family Communication:  Discussed in detail with the patient, all imaging results, lab results explained to the patient  Disposition Plan: Hopefully DC next 24-48 hours   Time Spent in minutes  25 minutes  Consultants:   Hematology, Dr. Betsy Coder   Cardiothoracic surgery, Dr. Roxan Hockey  Procedures:   Pericardial Window 04/21/2016   ECHO 04/20/2016 - EF 60%; Pericardium, extracardiac: A large, free-flowing pericardial effusion was identified circumferential to the heart. The fluid had no internal echoes. There was moderateright ventricular chamber collapse for more than 50% of the cardiac cycle. There was evidence for moderately increased RV-LV interaction demonstrated by respirophasic changes in transmitral velocities. Features were consistent with tamponade physiology.  Foley catheter   Antimicrobials:   Vanco 04/20/2016 -->5/15  Cefepime 04/20/2016 -->5/15   Augmentin 5/16>  Medications  Scheduled Meds: . acetaminophen  1,000 mg Oral Q6H   Or  . acetaminophen (TYLENOL) oral liquid 160 mg/5 mL  1,000 mg Oral Q6H  . amoxicillin-clavulanate  1 tablet Oral Q12H  . bisacodyl  10 mg Oral Daily  . enoxaparin (LOVENOX) injection  40 mg Subcutaneous Q24H  . mirtazapine  15 mg Oral QHS  . mupirocin ointment  1 application Nasal BID  . predniSONE  40 mg Oral Q breakfast  . senna-docusate  1 tablet Oral QHS  . sodium chloride flush  10-40 mL Intracatheter Q12H   Continuous Infusions: . 0.45 % NaCl with KCl 20 mEq / L 10 mL/hr at 04/24/16 2125   PRN Meds:.benzonatate, HYDROcodone-homatropine, HYDROmorphone (DILAUDID) injection, LORazepam, ondansetron (ZOFRAN) IV, oxyCODONE, potassium chloride, sodium chloride flush, zolpidem   Antibiotics   Anti-infectives    Start     Dose/Rate Route Frequency Ordered Stop   04/25/16 1000  amoxicillin-clavulanate (AUGMENTIN) 875-125 MG per tablet 1 tablet     1 tablet Oral Every 12 hours 04/25/16 0750     04/23/16 1335  vancomycin (VANCOCIN) IVPB 1000  mg/200 mL premix  Status:  Discontinued     1,000 mg 200 mL/hr over 60 Minutes Intravenous Every 8 hours 04/23/16 0812 04/25/16 0750   04/21/16 0600  vancomycin (VANCOCIN) IVPB 1000 mg/200 mL premix  Status:  Discontinued     1,000 mg 200 mL/hr over 60 Minutes Intravenous Every 12 hours 04/20/16 1731 04/23/16 0812   04/20/16 1630  ceFEPIme (MAXIPIME) 1 g in dextrose 5 % 50 mL IVPB  Status:  Discontinued     1 g 100 mL/hr over 30 Minutes Intravenous Every 8 hours 04/20/16 1615 04/25/16 0750   04/20/16 1630  vancomycin (VANCOCIN) 1,250 mg in sodium chloride 0.9 % 250 mL IVPB     1,250 mg 166.7 mL/hr over 90 Minutes Intravenous  Once 04/20/16 1623 04/20/16 2019        Subjective:   Joel Osborne was seen and examined today. Pain in the left knee improving. Overnight went into rapid atrial fibrillation with heart rate in 150s., Now normal sinus rhythm with tachycardia. No chest pain. No dizziness or palpitations. Denies any  abdominal pain, N/V/D/C, new weakness, numbess, tingling.   Objective:   Filed Vitals:  04/25/16 0427 04/25/16 0445 04/25/16 0717 04/25/16 0819  BP:  1'07/69 89/59 89/55 '$  Pulse: 86 89 90 86  Temp:   97.9 F (36.6 C)   TempSrc:   Oral   Resp: '18 23 17 20  '$ Height:      Weight:      SpO2: 94% 97% 93% 95%    Intake/Output Summary (Last 24 hours) at 04/25/16 1107 Last data filed at 04/25/16 0700  Gross per 24 hour  Intake  979.5 ml  Output   1500 ml  Net -520.5 ml     Wt Readings from Last 3 Encounters:  04/21/16 87.1 kg (192 lb 0.3 oz)  04/20/16 83.87 kg (184 lb 14.4 oz)  04/18/16 84.142 kg (185 lb 8 oz)     Exam  General: Alert and oriented x 3, NAD  HEENT:    Neck: Trach   Cardiovascular: S1 S2 clear, Regular rhythm   Respiratory: Diminished lung sounds at the bases   Gastrointestinal: Soft, nontender, nondistended, + bowel sounds  Ext: no cyanosis clubbing or edema  Neuro: no new deficits  Skin: No rashes  Psych: Normal affect  and demeanor, alert and oriented x3    Data Reviewed:  I have personally reviewed following labs and imaging studies  Micro Results Recent Results (from the past 240 hour(s))  Culture, blood (routine x 2) Call MD if unable to obtain prior to antibiotics being given     Status: None (Preliminary result)   Collection Time: 04/20/16  4:37 PM  Result Value Ref Range Status   Specimen Description BLOOD LEFT ANTECUBITAL  Final   Special Requests BOTTLES DRAWN AEROBIC AND ANAEROBIC 5CC  Final   Culture   Final    NO GROWTH 3 DAYS Performed at [facility]    Report Status PENDING  Incomplete  Culture, blood (routine x 2) Call MD if unable to obtain prior to antibiotics being given     Status: None (Preliminary result)   Collection Time: 04/20/16  4:37 PM  Result Value Ref Range Status   Specimen Description BLOOD RIGHT ARM  Final   Special Requests BOTTLES DRAWN AEROBIC AND ANAEROBIC 10CC  Final   Culture   Final    NO GROWTH 3 DAYS Performed at [facility]    Report Status PENDING  Incomplete  Culture, sputum-assessment     Status: None   Collection Time: 04/20/16  8:32 PM  Result Value Ref Range Status   Specimen Description SPU  Final   Special Requests NONE  Final   Sputum evaluation THIS SPECIMEN IS ACCEPTABLE FOR SPUTUM CULTURE  Final   Report Status 04/20/2016 FINAL  Final  Culture, respiratory (NON-Expectorated)     Status: None   Collection Time: 04/20/16  8:32 PM  Result Value Ref Range Status   Specimen Description SPUTUM  Final   Special Requests NONE  Final   Gram Stain   Final    MODERATE WBC PRESENT, PREDOMINANTLY PMN FEW SQUAMOUS EPITHELIAL CELLS PRESENT FEW GRAM NEGATIVE RODS FEW GRAM POSITIVE RODS FEW GRAM POSITIVE COCCI IN PAIRS IN CHAINS IN CLUSTERS THIS SPECIMEN IS ACCEPTABLE FOR SPUTUM CULTURE Performed at [facility]    Culture   Final    NORMAL OROPHARYNGEAL FLORA Performed at [facility]    Report Status 04/23/2016  FINAL  Final  Surgical PCR screen     Status: Abnormal   Collection Time: 04/21/16  1:00 AM  Result Value Ref Range Status  MRSA, PCR NEGATIVE NEGATIVE Final   Staphylococcus aureus POSITIVE (A) NEGATIVE Final    Comment:        The Xpert SA Assay (FDA approved for NASAL specimens in patients over 25 years of age), is one component of a comprehensive surveillance program.  Test performance has been validated by [facility] for patients greater than or equal to 35 year old. It is not intended to diagnose infection nor to guide or monitor treatment.   Culture, body fluid-bottle     Status: None (Preliminary result)   Collection Time: 04/21/16  8:21 AM  Result Value Ref Range Status   Specimen Description FLUID PERICARDIAL  Final   Special Requests NONE  Final   Culture NO GROWTH 2 DAYS  Final   Report Status PENDING  Incomplete  Gram stain     Status: None   Collection Time: 04/21/16  8:21 AM  Result Value Ref Range Status   Specimen Description FLUID PERICARDIAL  Final   Special Requests NONE  Final   Gram Stain   Final    MODERATE WBC PRESENT,BOTH PMN AND MONONUCLEAR NO ORGANISMS SEEN    Report Status 04/21/2016 FINAL  Final    Radiology Reports Ct Abdomen Pelvis Wo Contrast  04/20/2016  CLINICAL DATA:  History of esophageal carcinoma cough productive fevers chills, elevated liver enzymes EXAM: CT ABDOMEN AND PELVIS WITHOUT CONTRAST TECHNIQUE: Multidetector CT imaging of the abdomen and pelvis was performed following the standard protocol without IV contrast. COMPARISON:  04/20/2016 CT thorax FINDINGS: Small left and moderate right pleural effusion. Large pericardial effusion partially visualized. Gastric pull-up noted consistent with history of esophageal carcinoma. Partially visualized right lower lobe consolidation. Liver appears normal, as does the pancreas and as does spleen given limited evaluation without contrast. There are several small gallstones. No adrenal masses.  No significant renal abnormalities. 1 cm cyst upper pole left kidney and 4 mm cyst lower pole right kidney. No hydronephrosis. IV contrast concentrated within the dependent portion of the bladder. Nonobstructive bowel gas pattern.  Small and large bowel normal. Reproductive organs normal. No significant free fluid. No significant vascular abnormalities. No significant adenopathy. No acute musculoskeletal findings. IMPRESSION: Partially visualized large pericardial effusion. Right lower lobe consolidation. Bilateral pleural effusions. Cholelithiasis. Electronically Signed   By: Skipper Cliche M.D.   On: 04/20/2016 19:49   Dg Chest 2 View  04/24/2016  CLINICAL DATA:  Shortness of breath, weakness, esophageal malignancy, acute respiratory failure, tracheostomy patient. EXAM: CHEST  2 VIEW COMPARISON:  Portable chest x-ray of Apr 22, 2016 FINDINGS: The lungs are hypoinflated. There is bibasilar atelectasis. There small bilateral pleural effusions. The cardiac silhouette remains enlarged. The pulmonary vascularity is less engorged. The tracheostomy appliance tip projects between the clavicular heads. The power port appliance tip projects over the distal third of the SVC. IMPRESSION: Decreased pulmonary interstitial edema today. Persistent bibasilar atelectasis with small bilateral pleural effusions. No pneumothorax. The support tubes are in stable position. Electronically Signed   By: David  Martinique M.D.   On: 04/24/2016 07:30   Dg Chest 2 View  04/18/2016  CLINICAL DATA:  Esophageal cancer. Productive cough since Saturday. Shortness of breath. EXAM: CHEST  2 VIEW COMPARISON:  CT 02/07/2016 FINDINGS: Airspace opacity noted in both lower lobes concerning for pneumonia. Heart is mildly enlarged. Tracheostomy tube and right Port-A-Cath in place. No visible effusions. No acute bony abnormality. IMPRESSION: Bilateral lower lobe airspace opacities may reflect pneumonia. Mild cardiomegaly. Electronically Signed   By: Lennette Bihari  [place] M.D.   On: 04/18/2016 13:07   Ct Chest W Contrast  04/20/2016  CLINICAL DATA:  Healthcare associated pneumonia. EXAM: CT CHEST WITH CONTRAST TECHNIQUE: Multidetector CT imaging of the chest was performed during intravenous contrast administration. CONTRAST:  34m ISOVUE-300 IOPAMIDOL (ISOVUE-300) INJECTION 61% COMPARISON:  02/07/2016 FINDINGS: THORACIC INLET/BODY WALL: Porta catheter on the right with tip in good position at the upper cavoatrial junction. There is tracheostomy tube which is well seated. No thoracic inlet adenopathy. MEDIASTINUM: Normal heart size. New and massive pericardial effusion measuring up to 4 cm in thickness. There is smooth mild serosal enhancement. Pleural fluid has developed fairly rapidly, new since 02/07/2016. Changes of gastric pull-through. There is ill-defined adenopathy in the right hilum with right middle lobe bronchus obstruction and narrowing of right lower lobe pulmonary artery. This has a malignant appearance. Hilar adenopathy on the left is milder. Isolated index precarinal node has heterogeneous low-density appearance. This node measures 10 mm short axis compared 8 mm previously. LUNG WINDOWS: Septal thickening on the right which is likely from lymphatic obstruction. This appears fairly smooth there is no definitive parenchymal lymphangitic tumor. Nearly collapsed right middle lobe. Lobulated pulmonary metastases have enlarged, left lower lobe 5:81 measuring up to 23 mm compared to 19 mm previously. A lingular lobulated metastasis measures 14 mm compared to 11 previously. Small layering pleural effusions. UPPER ABDOMEN: Cholelithiasis. There is mild haziness of fat around the gallbladder but no over distention or definitive cholecystitis. Left diaphragmatic hernia containing colon and pancreatic tail. OSSEOUS: No acute fracture.  No suspicious lytic or blastic lesions. These results were called by telephone at the time of interpretation on 04/20/2016 at 7:37 pm to  Dr. KBaltazar Najjarwho verbally acknowledged these results. IMPRESSION: 1. Massive low-density pericardial effusion that has developed since 02/07/2016. No pericardial mass noted. 2. 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 3. Right middle lobe airway obstruction and collapse due to right hilar adenopathy. Right lung edema attributed to malignant lymphatic obstruction. Electronically Signed   By: JMonte FantasiaM.D.   On: 04/20/2016 19:38   Dg Chest Port 1 View  04/22/2016  CLINICAL DATA:  Evaluate effusion. EXAM: PORTABLE CHEST 1 VIEW COMPARISON:  Apr 18, 2016, CT scan Apr 20, 2016, and chest x-ray Apr 21, 2016 FINDINGS: A tracheostomy tube and right Port-A-Cath are stable. No pneumothorax. There is rounded opacity projected over the right hilum, mildly increased in the interval. This was not seen on Apr 18, 2016 and there was atelectasis in this region on the recent CT scan. Adenopathy was also seen in the right hilum on the recent CT scan. Layering bilateral effusions are again seen. Underlying compressive atelectasis. No change in the cardiomediastinal silhouette. Suggested mild edema. IMPRESSION: 1. Increasing rounded opacity projected over the right infrahilar region correlates with adenopathy and atelectasis on a recent CT scan. 2. Mildly worsened layering effusions, right greater than left. 3. Mild edema. Electronically Signed   By: DDorise BullionIII M.D   On: 04/22/2016 07:24   Dg Chest Port 1 View  04/21/2016  CLINICAL DATA:  Postop chest, pericardial effusion EXAM: PORTABLE CHEST 1 VIEW COMPARISON:  CT chest dated 04/20/2016 FINDINGS: Patient is rotated. Tracheostomy in satisfactory position. The heart is top-normal in size. Mild perihilar edema. Small left pleural effusion. Mild right infrahilar opacity, likely atelectasis. Right chest port terminates the cavoatrial junction. IMPRESSION: Mild perihilar edema with small left pleural effusion. Electronically Signed   By:  SJulian HyM.D.   On:  04/21/2016 09:43   Dg Knee Complete 4 Views Left  04/24/2016  CLINICAL DATA:  Anterior left knee pain when bearing weight. No known injuries. EXAM: LEFT KNEE - COMPLETE 4+ VIEW COMPARISON:  None. FINDINGS: Irregular periosteal reaction involving the anterior, lateral and medial (predominantly anterior) aspect of the distal femoral metaphysis. No evidence of acute or subacute fracture or dislocation. Joint spaces well preserved. Bone mineral density well-preserved. No visible joint effusion. IMPRESSION: Periostitis involving the distal femur. Differential diagnosis might include osteomyelitis, neoplasm, and hypertrophic pulmonary osteoarthropathy. Electronically Signed   By: Evangeline Dakin M.D.   On: 04/24/2016 11:11    Lab Data:  CBC:  Recent Labs Lab 04/20/16 1640 04/21/16 0440 04/22/16 0307 04/23/16 0530 04/24/16 0500 04/25/16 0438  WBC 13.5* 15.4* 7.6 7.0 6.7 9.8  NEUTROABS 11.2* 12.5*  --   --   --   --   HGB 12.7* 11.8* 10.4* 10.8* 11.8* 11.2*  HCT 38.2* 37.3* 33.2* 33.4* 37.1* 35.2*  MCV 85.8 88.4 89.0 88.1 87.3 87.6  PLT 297 219 153 139* 138* 707   Basic Metabolic Panel:  Recent Labs Lab 04/20/16 1640 04/21/16 0440 04/22/16 0307 04/24/16 0500 04/25/16 0438  NA 137 139 138 135 140  K 5.0 4.7 3.6 3.9 4.4  CL 99* 104 107 105 106  CO2 '23 22 23 25 26  '$ GLUCOSE 184* 158* 88 117* 133*  BUN 31* 28* 19 <5* 5*  CREATININE 1.25* 1.07 0.71 0.51* 0.52*  CALCIUM 8.9 8.5* 7.6* 7.8* 8.3*   GFR: Estimated Creatinine Clearance: 119.9 mL/min (by C-G formula based on Cr of 0.52). Liver Function Tests:  Recent Labs Lab 04/21/16 0440 04/22/16 0307 04/23/16 0530 04/24/16 0500 04/25/16 0438  AST 2014* 525* 143* 103* 81*  ALT 1872* 1035* 629* 512* 365*  ALKPHOS 126 90 81 93 95  BILITOT 1.8* 1.0 1.0 0.9 0.7  PROT 6.2* 4.7* 4.7* 5.6* 5.0*  ALBUMIN 2.9* 2.1* 2.0* 2.2* 2.0*   No results for input(s): LIPASE, AMYLASE in the last 168 hours. No  results for input(s): AMMONIA in the last 168 hours. Coagulation Profile:  Recent Labs Lab 04/20/16 1827  INR 2.05*   Cardiac Enzymes: No results for input(s): CKTOTAL, CKMB, CKMBINDEX, TROPONINI in the last 168 hours. BNP (last 3 results) No results for input(s): PROBNP in the last 8760 hours. HbA1C: No results for input(s): HGBA1C in the last 72 hours. CBG:  Recent Labs Lab 04/21/16 0549 04/21/16 0911 04/21/16 1138 04/21/16 1509  GLUCAP 142* 149* 109* 108*   Lipid Profile: No results for input(s): CHOL, HDL, LDLCALC, TRIG, CHOLHDL, LDLDIRECT in the last 72 hours. Thyroid Function Tests: No results for input(s): TSH, T4TOTAL, FREET4, T3FREE, THYROIDAB in the last 72 hours. Anemia Panel: No results for input(s): VITAMINB12, FOLATE, FERRITIN, TIBC, IRON, RETICCTPCT in the last 72 hours. Urine analysis:    Component Value Date/Time   COLORURINE AMBER* 04/20/2016 2033   APPEARANCEUR CLEAR 04/20/2016 2033   LABSPEC 1.042* 04/20/2016 2033   PHURINE 5.5 04/20/2016 2033   GLUCOSEU NEGATIVE 04/20/2016 2033   HGBUR NEGATIVE 04/20/2016 2033   BILIRUBINUR SMALL* 04/20/2016 2033   KETONESUR NEGATIVE 04/20/2016 2033   PROTEINUR NEGATIVE 04/20/2016 2033   UROBILINOGEN 0.2 07/03/2013 1000   NITRITE NEGATIVE 04/20/2016 2033   LEUKOCYTESUR NEGATIVE 04/20/2016 2033     Joel Osborne M.D. [facility] 04/25/2016, 11:07 AM  Pager: [phone] Between 7am to 7pm - call Pager - [phone]  After 7pm go to [email or web address] - password [email or web address]  Call night coverage person  covering after 7pm

## 2016-04-26 DIAGNOSIS — I4891 Unspecified atrial fibrillation: Secondary | ICD-10-CM

## 2016-04-26 DIAGNOSIS — M25569 Pain in unspecified knee: Secondary | ICD-10-CM | POA: Insufficient documentation

## 2016-04-26 DIAGNOSIS — R748 Abnormal levels of other serum enzymes: Secondary | ICD-10-CM

## 2016-04-26 DIAGNOSIS — R52 Pain, unspecified: Secondary | ICD-10-CM

## 2016-04-26 DIAGNOSIS — I314 Cardiac tamponade: Secondary | ICD-10-CM

## 2016-04-26 DIAGNOSIS — A419 Sepsis, unspecified organism: Principal | ICD-10-CM

## 2016-04-26 DIAGNOSIS — R599 Enlarged lymph nodes, unspecified: Secondary | ICD-10-CM

## 2016-04-26 DIAGNOSIS — I319 Disease of pericardium, unspecified: Secondary | ICD-10-CM

## 2016-04-26 DIAGNOSIS — J189 Pneumonia, unspecified organism: Secondary | ICD-10-CM

## 2016-04-26 DIAGNOSIS — D638 Anemia in other chronic diseases classified elsewhere: Secondary | ICD-10-CM

## 2016-04-26 DIAGNOSIS — D72829 Elevated white blood cell count, unspecified: Secondary | ICD-10-CM

## 2016-04-26 LAB — COMPREHENSIVE METABOLIC PANEL
ALBUMIN: 2.1 g/dL — AB (ref 3.5–5.0)
ALK PHOS: 93 U/L (ref 38–126)
ALT: 285 U/L — ABNORMAL HIGH (ref 17–63)
AST: 61 U/L — AB (ref 15–41)
Anion gap: 8 (ref 5–15)
BILIRUBIN TOTAL: 0.6 mg/dL (ref 0.3–1.2)
BUN: 7 mg/dL (ref 6–20)
CALCIUM: 8.1 mg/dL — AB (ref 8.9–10.3)
CO2: 27 mmol/L (ref 22–32)
Chloride: 106 mmol/L (ref 101–111)
Creatinine, Ser: 0.55 mg/dL — ABNORMAL LOW (ref 0.61–1.24)
GFR calc Af Amer: >60 mL/min (ref >60)
GFR calc non Af Amer: >60 mL/min (ref >60)
GLUCOSE: 102 mg/dL — AB (ref 65–99)
POTASSIUM: 3.9 mmol/L (ref 3.5–5.1)
SODIUM: 141 mmol/L (ref 135–145)
TOTAL PROTEIN: 4.9 g/dL — AB (ref 6.5–8.1)

## 2016-04-26 LAB — CBC
HEMATOCRIT: 33.1 % — AB (ref 39.0–52.0)
HEMOGLOBIN: 10.6 g/dL — AB (ref 13.0–17.0)
MCH: 28 pg (ref 26.0–34.0)
MCHC: 32 g/dL (ref 30.0–36.0)
MCV: 87.6 fL (ref 78.0–100.0)
Platelets: 171 10*3/uL (ref 150–400)
RBC: 3.78 MIL/uL — ABNORMAL LOW (ref 4.22–5.81)
RDW: 14.3 % (ref 11.5–15.5)
WBC: 9.8 10*3/uL (ref 4.0–10.5)

## 2016-04-26 LAB — CULTURE, BODY FLUID-BOTTLE: CULTURE: NO GROWTH

## 2016-04-26 LAB — CULTURE, BODY FLUID W GRAM STAIN -BOTTLE

## 2016-04-26 MED ORDER — AMOXICILLIN-POT CLAVULANATE 875-125 MG PO TABS: 1.0000 | ORAL_TABLET | Freq: Two times a day (BID) | ORAL | Status: DC

## 2016-04-26 MED ORDER — HEPARIN SOD (PORK) LOCK FLUSH 100 UNIT/ML IV SOLN
500.0000 [IU] | INTRAVENOUS | Status: AC | PRN
  Administered 2016-04-26: 500 [IU]

## 2016-04-26 MED ORDER — BISACODYL 5 MG PO TBEC: 10.0000 mg | DELAYED_RELEASE_TABLET | Freq: Every day | ORAL | Status: DC

## 2016-04-26 MED ORDER — BENZONATATE 100 MG PO CAPS: 100.0000 mg | ORAL_CAPSULE | Freq: Three times a day (TID) | ORAL | Status: DC | PRN

## 2016-04-26 MED ORDER — CEFDINIR 300 MG PO CAPS: 300.0000 mg | ORAL_CAPSULE | Freq: Two times a day (BID) | ORAL | Status: DC

## 2016-04-26 NOTE — Care Management Note (Signed)
Case Management Note  Patient Details  Name: Joel Osborne MRN: [id number] Date of Birth: [birth year]  Subjective/Objective:   Patient for dc today, from  Home with spouse, cards cleared for dc .  Per pt eval no f/u needed.                 Action/Plan:   Expected Discharge Date:  04/22/16               Expected Discharge Plan:  Home/Self Care  In-House Referral:     Discharge planning Services  CM Consult  Post Acute Care Choice:    Choice offered to:     DME Arranged:    DME Agency:     HH Arranged:    HH Agency:     Status of Service:  Completed, signed off  Medicare Important Message Given:    Date Medicare IM Given:    Medicare IM give by:    Date Additional Medicare IM Given:    Additional Medicare Important Message give by:     If discussed at [place] of Stay Meetings, dates discussed:    Additional Comments:  Zenon Mayo, RN 04/26/2016, 4:13 PM

## 2016-04-26 NOTE — Progress Notes (Signed)
Pt refused this am does of augmentin and does not want to take it at home either, educations given regarding use and need for medication but pt continues to refuse. Dr. Ree Kida notified and will continue to monitor

## 2016-04-26 NOTE — Progress Notes (Signed)
PT Cancellation Note  Patient Details Name: Devaughn Savant MRN: [id number] DOB: [birth year]   Cancelled Treatment:    Reason Eval/Treat Not Completed: Other (comment); checked on pt who was dressed and anticipating d/c.  Reports feels he can traverse steps at home with rail and son to assist.  Will cancel due to imminent d/c.   Reginia Naas 04/26/2016, 3:16 PM  Magda Kiel, [facility] 04/26/2016

## 2016-04-26 NOTE — Progress Notes (Signed)
Pt discharged home per DM order. Discharge instructions provided and all questions answered. Prior to discharge pt stated "I will take a liquid antibiotic" MD called and new order received.

## 2016-04-26 NOTE — Progress Notes (Signed)
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  Breathing stable.  Wants to go home.  Not interested in event monitoring as outpt.  Inpatient Medications    . acetaminophen  1,000 mg Oral Q6H   Or  . acetaminophen (TYLENOL) oral liquid 160 mg/5 mL  1,000 mg Oral Q6H  . amoxicillin-clavulanate  1 tablet Oral Q12H  . bisacodyl  10 mg Oral Daily  . enoxaparin (LOVENOX) injection  40 mg Subcutaneous Q24H  . mirtazapine  15 mg Oral QHS  . mupirocin ointment  1 application Nasal BID  . senna-docusate  1 tablet Oral QHS  . sodium chloride flush  10-40 mL Intracatheter Q12H    Vital Signs    Filed Vitals:   04/26/16 0328 04/26/16 0332 04/26/16 0431 04/26/16 0803  BP:  116/75  118/89  Pulse:  95  92  Temp: 99.1 F (37.3 C)   98.8 F (37.1 C)  TempSrc: Oral   Oral  Resp:  18  34  Height:      Weight:      SpO2:  97% 98% 95%    Intake/Output Summary (Last 24 hours) at 04/26/16 0948 Last data filed at 04/25/16 1921  Gross per 24 hour  Intake    120 ml  Output    850 ml  Net   -730 ml   Filed Weights   04/20/16 1558 04/21/16 0050  Weight: 184 lb 14.4 oz (83.87 kg) 192 lb 0.3 oz (87.1 kg)    Physical Exam    General: Pleasant, NAD. Neuro: Alert and oriented X 3. Moves all extremities spontaneously. Psych: Flat affect. HEENT:  Normal  Neck: Supple without bruits or JVD.  Trach present. Lungs:  Resp regular and unlabored, diminished breath sounds bilat bases. Heart: RRR no s3,  s4, or murmurs.  No rub. Abdomen: Soft, non-tender, non-distended, BS + x 4.  Extremities: No clubbing, cyanosis.  Trace bilat LE edema. DP/PT/Radials 2+ and equal bilaterally.  Labs    CBC  Recent Labs  04/25/16 0438 04/26/16 0400  WBC 9.8 9.8  HGB 11.2* 10.6*  HCT 35.2* 33.1*  MCV 87.6 87.6  PLT 178 740   Basic Metabolic Panel  Recent Labs  04/25/16 0438 04/26/16 0400  NA 140 141  K 4.4 3.9  CL 106 106  CO2 26 27  GLUCOSE 133* 102*  BUN 5* 7  CREATININE 0.52* 0.55*  CALCIUM 8.3* 8.1*   Liver Function Tests  Recent Labs  04/25/16 0438 04/26/16 0400  AST 81* 61*  ALT 365* 285*  ALKPHOS 95 93  BILITOT 0.7 0.6  PROT 5.0* 4.9*  ALBUMIN 2.0* 2.1*    Telemetry    RSR, no additional AF.  Radiology    Ct Abdomen Pelvis Wo Contrast  04/20/2016  CLINICAL DATA:  History of esophageal carcinoma cough productive fevers chills, elevated liver enzymes EXAM: CT ABDOMEN AND PELVIS WITHOUT CONTRAST TECHNIQUE: Multidetector CT imaging of the abdomen and pelvis was performed following the  standard protocol without IV contrast. COMPARISON:  04/20/2016 CT thorax FINDINGS: Small left and moderate right pleural effusion. Large pericardial effusion partially visualized. Gastric pull-up noted consistent with history of esophageal carcinoma. Partially visualized right lower lobe consolidation. Liver appears normal, as does the pancreas and as does spleen given limited evaluation without contrast. There are several small gallstones. No adrenal masses. No significant renal abnormalities. 1 cm cyst upper pole left kidney and 4 mm cyst lower pole right kidney. No hydronephrosis. IV contrast concentrated within the dependent portion of the bladder. Nonobstructive bowel gas pattern.  Small and large bowel normal. Reproductive organs normal. No significant free fluid. No significant vascular abnormalities. No significant adenopathy. No acute musculoskeletal findings. IMPRESSION: Partially  visualized large pericardial effusion. Right lower lobe consolidation. Bilateral pleural effusions. Cholelithiasis. Electronically Signed   By: Skipper Cliche M.D.   On: 04/20/2016 19:49   Dg Chest 2 View  04/24/2016  CLINICAL DATA:  Shortness of breath, weakness, esophageal malignancy, acute respiratory failure, tracheostomy patient. EXAM: CHEST  2 VIEW COMPARISON:  Portable chest x-ray of Apr 22, 2016 FINDINGS: The lungs are hypoinflated. There is bibasilar atelectasis. There small bilateral pleural effusions. The cardiac silhouette remains enlarged. The pulmonary vascularity is less engorged. The tracheostomy appliance tip projects between the clavicular heads. The power port appliance tip projects over the distal third of the SVC. IMPRESSION: Decreased pulmonary interstitial edema today. Persistent bibasilar atelectasis with small bilateral pleural effusions. No pneumothorax. The support tubes are in stable position. Electronically Signed   By: David  Martinique M.D.   On: 04/24/2016 07:30   Dg Chest 2 View  04/18/2016  CLINICAL DATA:  Esophageal cancer. Productive cough since Saturday. Shortness of breath. EXAM: CHEST  2 VIEW COMPARISON:  CT 02/07/2016 FINDINGS: Airspace opacity noted in both lower lobes concerning for pneumonia. Heart is mildly enlarged. Tracheostomy tube and right Port-A-Cath in place. No visible effusions. No acute bony abnormality. IMPRESSION: Bilateral lower lobe airspace opacities may reflect pneumonia. Mild cardiomegaly. Electronically Signed   By: Rolm Baptise M.D.   On: 04/18/2016 13:07   Ct Chest W Contrast  04/20/2016  CLINICAL DATA:  Healthcare associated pneumonia. EXAM: CT CHEST WITH CONTRAST TECHNIQUE: Multidetector CT imaging of the chest was performed during intravenous contrast administration. CONTRAST:  59m ISOVUE-300 IOPAMIDOL (ISOVUE-300) INJECTION 61% COMPARISON:  02/07/2016 FINDINGS: THORACIC INLET/BODY WALL: Porta catheter on the right with tip in good position at  the upper cavoatrial junction. There is tracheostomy tube which is well seated. No thoracic inlet adenopathy. MEDIASTINUM: Normal heart size. New and massive pericardial effusion measuring up to 4 cm in thickness. There is smooth mild serosal enhancement. Pleural fluid has developed fairly rapidly, new since 02/07/2016. Changes of gastric pull-through. There is ill-defined adenopathy in the right hilum with right middle lobe bronchus obstruction and narrowing of right lower lobe pulmonary artery. This has a malignant appearance. Hilar adenopathy on the left is milder. Isolated index precarinal node has heterogeneous low-density appearance. This node measures 10 mm short axis compared 8 mm previously. LUNG WINDOWS: Septal thickening on the right which is likely from lymphatic obstruction. This appears fairly smooth there is no definitive parenchymal lymphangitic tumor. Nearly collapsed right middle lobe. Lobulated pulmonary metastases have enlarged, left lower lobe 5:81 measuring up to 23 mm compared to 19 mm previously. A lingular lobulated metastasis measures 14 mm compared to 11 previously. Small layering pleural effusions. UPPER ABDOMEN: Cholelithiasis. There is mild haziness of fat around the gallbladder but no over distention or definitive  cholecystitis. Left diaphragmatic hernia containing colon and pancreatic tail. OSSEOUS: No acute fracture.  No suspicious lytic or blastic lesions. These results were called by telephone at the time of interpretation on 04/20/2016 at 7:37 pm to Dr. Baltazar Najjar who verbally acknowledged these results. IMPRESSION: 1. Massive low-density pericardial effusion that has developed since 02/07/2016. No pericardial mass noted. 2. 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 3. Right middle lobe airway obstruction and collapse due to right hilar adenopathy. Right lung edema attributed to malignant lymphatic obstruction. Electronically Signed   By: Monte Fantasia M.D.   On: 04/20/2016 19:38   Dg Chest Port 1 View  04/22/2016  CLINICAL DATA:  Evaluate effusion. EXAM: PORTABLE CHEST 1 VIEW COMPARISON:  Apr 18, 2016, CT scan Apr 20, 2016, and chest x-ray Apr 21, 2016 FINDINGS: A tracheostomy tube and right Port-A-Cath are stable. No pneumothorax. There is rounded opacity projected over the right hilum, mildly increased in the interval. This was not seen on Apr 18, 2016 and there was atelectasis in this region on the recent CT scan. Adenopathy was also seen in the right hilum on the recent CT scan. Layering bilateral effusions are again seen. Underlying compressive atelectasis. No change in the cardiomediastinal silhouette. Suggested mild edema. IMPRESSION: 1. Increasing rounded opacity projected over the right infrahilar region correlates with adenopathy and atelectasis on a recent CT scan. 2. Mildly worsened layering effusions, right greater than left. 3. Mild edema. Electronically Signed   By: Dorise Bullion III M.D   On: 04/22/2016 07:24   Dg Chest Port 1 View  04/21/2016  CLINICAL DATA:  Postop chest, pericardial effusion EXAM: PORTABLE CHEST 1 VIEW COMPARISON:  CT chest dated 04/20/2016 FINDINGS: Patient is rotated. Tracheostomy in satisfactory position. The heart is top-normal in size. Mild perihilar edema. Small left pleural effusion. Mild right infrahilar opacity, likely atelectasis. Right chest port terminates the cavoatrial junction. IMPRESSION: Mild perihilar edema with small left pleural effusion. Electronically Signed   By: Julian Hy M.D.   On: 04/21/2016 09:43   Dg Knee Complete 4 Views Left  04/24/2016  CLINICAL DATA:  Anterior left knee pain when bearing weight. No known injuries. EXAM: LEFT KNEE - COMPLETE 4+ VIEW COMPARISON:  None. FINDINGS: Irregular periosteal reaction involving the anterior, lateral and medial (predominantly anterior) aspect of the distal femoral metaphysis. No evidence of acute or subacute fracture or dislocation.  Joint spaces well preserved. Bone mineral density well-preserved. No visible joint effusion. IMPRESSION: Periostitis involving the distal femur. Differential diagnosis might include osteomyelitis, neoplasm, and hypertrophic pulmonary osteoarthropathy. Electronically Signed   By: Evangeline Dakin M.D.   On: 04/24/2016 11:11   2D Echocardiogram 5.16.2017  Study Conclusions  - Left ventricle: The cavity size was normal. Systolic function was  normal. The estimated ejection fraction was in the range of 60%  to 65%. The study is not technically sufficient to allow  evaluation of LV diastolic function. - Pericardium, extracardiac: Small to moderate residual ? loculated  posterior and lateral pericardial effusion  Assessment & Plan    1.  Large pericardial effusion with tamponade:  S/p subxyphoid pericardial window on 5/12.  Echo yesterday showed nl LV fxn with small to moderate posterior and lateral effusion.  Hemodynamically stable.  Pathology/culture pending.  2.  PAF:  Maintaining sinus since 5 AM on 5/16.  Not on av nodal blocking agents 2/2 previously soft bp's - has been in 1-teen overnight/this AM.  Could consider low dose metoprolol.  CHA2DS2VASc = 0,  thus no OAC.  He has declined being set up for a 30 day event monitor.  Will arrange for cardiology f/u within the next few wks.  3.  HCAP/sepsis:  Afebrile this AM.  WBC nl. Abx per IM.  4.  Esophageal CA/RML airway obstruction & collabse due to right hilar adn/malilgnant lymphatic obstruction:  Pathology from pericardial fluid pending.  Signed, Murray Hodgkins NP  I have seen and examined the patient along with Murray Hodgkins NP.  I have reviewed the chart, notes and new data.  I agree with NP's note.   PLAN: He seems to be withdrawing and shows little interest in any medical intervention or evaluation. His prognosis is poor and his wishes are to be respected, but he can still benefit from medical care. Suggest palliative  care consult.  Sanda Klein, MD, [place] [phone] 04/26/2016, 1:17 PM

## 2016-04-26 NOTE — Discharge Summary (Addendum)
Physician Discharge Summary  Joel Osborne HDQ:[id number] DOB: 1966-08-15 DOA: 04/20/2016  PCP: [name], MD  Admit date: 04/20/2016 Discharge date: 04/26/2016  Time spent: 45 minutes  Recommendations for Outpatient Follow-up:  Patient will be discharged to home.  Patient will need to follow up with primary care provider within one week of discharge.  Follow up with with cardiology and cardiothoracic surgery as scheduled.  Patient should continue medications as prescribed.  Patient should follow a Soft diet.   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 Kelvin Cellar Joel Osborne is a 50 y.o. 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 He presented to the clinic today for follow-up visit. Over the past week he has had progressively worsening cough associated with clear/yellow sputum production, subjective fevers, chills, generalized weakness, malaise, fatigue, poor  tolerance to physical exertion. He had a chest x-ray performed on 04/18/2016 that showed bilateral lower lobe airspace opacities consistent with pneumonia. On this date he was started on Levaquin by his primary oncologist. Despite taking Levaquin in the outpatient setting he presented today reporting feeling worse. His primary oncologist Dr.Sherrill requests he be admitted to the inpatient service to receive IV antibiotic therapy and further workup.  Hospital Course:  Acute respiratory failure with hypoxia ([place]) / HCAP (healthcare-associated pneumonia)/ Right middle lobe airway obstruction and collapse due to right hilar adenopathy / Right lung edema secondary to malignant lymphatic obstruction -Stable resp status, O2 sats 95%, has trach  -Aggressive pulmonary toilet. -Discontinued IV vancomycin and cefepime, transitioned to oral Augmentin -Patient refused Augmentin would like liquid form.  Will discharge with omnicef liquid form  Pericardial effusion with cardiac tamponade -S/P pericardial window on 5/12 -Management per thoracic surgery. Chest tube out. -Follow up with Dr. Roxan Hockey -Fluid culture shows no growth to date  Atrial fibrillation with RVR new diagnosis, paroxysmal -Now converted back in normal sinus rhythm -Cardiology consulted and appreciated -patient had a 2-D echo on 5/11 which showed EF of 60% however had shown large pericardial effusion.  -Repeat 2-D echo 5/16: EF 60-65%, small to moderate residual ? Loculated posterior and lateral pericardial effusion -Due to large cardiac tamponade and pericardial window, may not be a candidate for anticoagulation, will await cardiology recommendations. -CHADSVASC 0, thus no oral AC -Cardiology wanted to discharge patient with 30day event monitor- but patient refused -Follow up with cardiology in a few weeks  Sepsis due to pneumonia ([place]) / Leukocytosis-resolved -Sepsis criteria met with fever, tachycardia, tachypnea, hypoxia (O2  saturation 92% on [employer] oxygen)  support), leukocytosis, lactic acidosis. Sepsis physiology resolved -Presumed source of infections HCAP, discontinued IV vancomycin, cefepime (received 5 days), Continue on Augmentin -Blood cultures 2: Negative to date. HIV non reactive  -Strep pneumonia ag is negative, Resp culture with normal flora  Adenocarcinoma of distal third of esophagus/gastric cardia [facility]) -Appreciate Dr. Benay Spice following, will follow outpatient on 5/22. -Last chemo 04/07/2016  Anemia of chronic disease -Due to sequela of chemotherapy  -Currently stable  Moderate protein calorie malnutrition -In the context of chronic illness, malignancy -Seen by nutritionist   History of bilateral vocal cord paralysis, status post tracheostomy and bilateral injection laryngoplasty at [facility] 10/27/15  Abnormal LFTs: Possibly due to tamponad versus shock liver from sepsis  -per oncology, less likely due to chemotherapy, Improving -Improving. No GI symptoms reported  Left knee pain-improving  -Doppler ultrasound negative for DVT, uric acid normal, possibly due to gout/pseudogout, will continue prednisone trial, taper quickly .  Procedures:  Pericardial Window 04/21/2016   ECHO 04/20/2016 - EF 60%; Pericardium, extracardiac: A large, free-flowing pericardial effusion was identified circumferential to the heart. The fluid had no internal echoes. There was moderateright ventricular chamber collapse for more than 50% of the cardiac cycle. There was evidence for moderately increased RV-LV interaction demonstrated by respirophasic changes in transmitral velocities. Features were consistent with tamponade physiology.  Foley catheter  Consultations:  Hematology, Dr. Betsy Coder   Cardiothoracic surgery, Dr. Roxan Hockey  Cardiology   Discharge Exam: Filed Vitals:   04/26/16 0803 04/26/16 1217  BP: 118/89   Pulse: 92   Temp: 98.8 F (37.1 C) 98.8 F (37.1 C)  Resp: 34      General:  Well developed, well nourished, NAD, appears stated age  HEENT: NCAT, mucous membranes moist.  Neck: +trach  Cardiovascular: S1 S2 auscultated, no rubs, murmurs or gallops. Regular rate and rhythm.  Respiratory:Diminished but clear  Abdomen: Soft, nontender, nondistended, + bowel sounds  Extremities: warm dry without cyanosis clubbing, trace LE edema B/L  Neuro: AAOx3, nonfocal  Psych: Normal affect and demeanor  Discharge Instructions      Discharge Instructions    Discharge instructions    Complete by:  As directed   Patient will be discharged to home.  Patient will need to follow up with primary care provider within one week of discharge.  Follow up with with cardiology and cardiothoracic surgery as scheduled.  Patient should continue medications as prescribed.  Patient should follow a Soft diet.            Medication List    STOP taking these medications        ibuprofen 200 MG tablet  Commonly known as:  ADVIL,MOTRIN     levofloxacin 25 MG/ML solution  Commonly known as:  LEVAQUIN     OVER THE COUNTER MEDICATION      TAKE these medications        acetaminophen 500 MG tablet  Commonly known as:  TYLENOL  Take 1,000 mg by mouth every 6 (six) hours as needed for mild pain or moderate pain.     anti-nausea solution  Take 10 mLs by mouth every 15 (fifteen) minutes as needed for nausea or vomiting.     benzonatate 100 MG capsule  Commonly known as:  TESSALON  Take 1 capsule (100 mg total) by mouth 3 (three) times daily as needed for cough.     bisacodyl 5 MG EC tablet  Commonly known as:  DULCOLAX  Take 2 tablets (10 mg total) by mouth daily.  brompheniramine-pseudoephedrine 1-15 MG/5ML Elix  Commonly known as:  DIMETAPP  Take by mouth 2 (two) times daily as needed for allergies.     cefdinir 300 MG capsule  Commonly known as:  OMNICEF  Take 1 capsule (300 mg total) by mouth 2 (two) times daily. Dispense in liquid form      dextromethorphan-guaiFENesin 30-600 MG 12hr tablet  Commonly known as:  MUCINEX DM  Take 1 tablet by mouth 2 (two) times daily. Reported on 04/20/2016     eszopiclone 1 MG Tabs tablet  Commonly known as:  LUNESTA  TAKE 1-2 TABLETS BY MOUTH AT BEDTIME AS NEEDED FOR SLEEP     guaiFENesin 100 MG/5ML liquid  Commonly known as:  ROBITUSSIN  Take 200 mg by mouth 3 (three) times daily as needed for cough.     lactase 3000 units tablet  Commonly known as:  LACTAID  Take 3,000 Units by mouth daily as needed (lactose intolerance). For lactose intolerance     lidocaine-prilocaine cream  Commonly known as:  EMLA  Apply 1 application topically as needed. Apply tablespoon to PAC 2 hours prior to stick and cover with plastic wrap     mirtazapine 15 MG tablet  Commonly known as:  REMERON  TAKE ONE TABLET BY MOUTH AT BEDTIME     multivitamin with minerals Tabs tablet  Take 1 tablet by mouth daily.       Allergies  Allergen Reactions  . Lactose Intolerance (Gi) Other (See Comments)    Upset stomach    Follow-up Information    Follow up with Melrose Nakayama, MD On 05/09/2016.   Specialty:  Cardiothoracic Surgery   Why:  Appointment is at 1:00, please get CXR at [facility] located on first floor of [place] center.. 30 min prior to your appointment with Dr. Ollen Gross information:   [place] [phone]       Follow up with Kerin Ransom, PA-C On 05/15/2016.   Specialties:  Cardiology, Radiology   Why:  9:30 AM - Dr. Victorino December PA   Contact information:   [address] [place] [address] [phone]       Follow up with [name], MD. Schedule an appointment as soon as possible for a visit in 1 week.   Specialty:  Internal Medicine   Why:  Hospital follow up   Contact information:   [address] [place] [address] [phone]        The results of significant diagnostics  from this hospitalization (including imaging, microbiology, ancillary and laboratory) are listed below for reference.    Significant Diagnostic Studies: Ct Abdomen Pelvis Wo Contrast  04/20/2016  CLINICAL DATA:  History of esophageal carcinoma cough productive fevers chills, elevated liver enzymes EXAM: CT ABDOMEN AND PELVIS WITHOUT CONTRAST TECHNIQUE: Multidetector CT imaging of the abdomen and pelvis was performed following the standard protocol without IV contrast. COMPARISON:  04/20/2016 CT thorax FINDINGS: Small left and moderate right pleural effusion. Large pericardial effusion partially visualized. Gastric pull-up noted consistent with history of esophageal carcinoma. Partially visualized right lower lobe consolidation. Liver appears normal, as does the pancreas and as does spleen given limited evaluation without contrast. There are several small gallstones. No adrenal masses. No significant renal abnormalities. 1 cm cyst upper pole left kidney and 4 mm cyst lower pole right kidney. No hydronephrosis. IV contrast concentrated within the dependent portion of the bladder. Nonobstructive bowel gas pattern.  Small  and large bowel normal. Reproductive organs normal. No significant free fluid. No significant vascular abnormalities. No significant adenopathy. No acute musculoskeletal findings. IMPRESSION: Partially visualized large pericardial effusion. Right lower lobe consolidation. Bilateral pleural effusions. Cholelithiasis. Electronically Signed   By: Skipper Cliche M.D.   On: 04/20/2016 19:49   Dg Chest 2 View  04/24/2016  CLINICAL DATA:  Shortness of breath, weakness, esophageal malignancy, acute respiratory failure, tracheostomy patient. EXAM: CHEST  2 VIEW COMPARISON:  Portable chest x-ray of Apr 22, 2016 FINDINGS: The lungs are hypoinflated. There is bibasilar atelectasis. There small bilateral pleural effusions. The cardiac silhouette remains enlarged. The pulmonary vascularity is less engorged.  The tracheostomy appliance tip projects between the clavicular heads. The power port appliance tip projects over the distal third of the SVC. IMPRESSION: Decreased pulmonary interstitial edema today. Persistent bibasilar atelectasis with small bilateral pleural effusions. No pneumothorax. The support tubes are in stable position. Electronically Signed   By: David  Martinique M.D.   On: 04/24/2016 07:30   Dg Chest 2 View  04/18/2016  CLINICAL DATA:  Esophageal cancer. Productive cough since Saturday. Shortness of breath. EXAM: CHEST  2 VIEW COMPARISON:  CT 02/07/2016 FINDINGS: Airspace opacity noted in both lower lobes concerning for pneumonia. Heart is mildly enlarged. Tracheostomy tube and right Port-A-Cath in place. No visible effusions. No acute bony abnormality. IMPRESSION: Bilateral lower lobe airspace opacities may reflect pneumonia. Mild cardiomegaly. Electronically Signed   By: Rolm Baptise M.D.   On: 04/18/2016 13:07   Ct Chest W Contrast  04/20/2016  CLINICAL DATA:  Healthcare associated pneumonia. EXAM: CT CHEST WITH CONTRAST TECHNIQUE: Multidetector CT imaging of the chest was performed during intravenous contrast administration. CONTRAST:  71m ISOVUE-300 IOPAMIDOL (ISOVUE-300) INJECTION 61% COMPARISON:  02/07/2016 FINDINGS: THORACIC INLET/BODY WALL: Porta catheter on the right with tip in good position at the upper cavoatrial junction. There is tracheostomy tube which is well seated. No thoracic inlet adenopathy. MEDIASTINUM: Normal heart size. New and massive pericardial effusion measuring up to 4 cm in thickness. There is smooth mild serosal enhancement. Pleural fluid has developed fairly rapidly, new since 02/07/2016. Changes of gastric pull-through. There is ill-defined adenopathy in the right hilum with right middle lobe bronchus obstruction and narrowing of right lower lobe pulmonary artery. This has a malignant appearance. Hilar adenopathy on the left is milder. Isolated index precarinal node  has heterogeneous low-density appearance. This node measures 10 mm short axis compared 8 mm previously. LUNG WINDOWS: Septal thickening on the right which is likely from lymphatic obstruction. This appears fairly smooth there is no definitive parenchymal lymphangitic tumor. Nearly collapsed right middle lobe. Lobulated pulmonary metastases have enlarged, left lower lobe 5:81 measuring up to 23 mm compared to 19 mm previously. A lingular lobulated metastasis measures 14 mm compared to 11 previously. Small layering pleural effusions. UPPER ABDOMEN: Cholelithiasis. There is mild haziness of fat around the gallbladder but no over distention or definitive cholecystitis. Left diaphragmatic hernia containing colon and pancreatic tail. OSSEOUS: No acute fracture.  No suspicious lytic or blastic lesions. These results were called by telephone at the time of interpretation on 04/20/2016 at 7:37 pm to Dr. KBaltazar Najjarwho verbally acknowledged these results. IMPRESSION: 1. Massive low-density pericardial effusion that has developed since 02/07/2016. No pericardial mass noted. 2. 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 3. Right middle lobe airway obstruction and collapse due to right hilar adenopathy. Right lung edema attributed to malignant lymphatic obstruction. Electronically Signed   By: JNeva SeatD.  On: 04/20/2016 19:38   Dg Chest Port 1 View  04/22/2016  CLINICAL DATA:  Evaluate effusion. EXAM: PORTABLE CHEST 1 VIEW COMPARISON:  Apr 18, 2016, CT scan Apr 20, 2016, and chest x-ray Apr 21, 2016 FINDINGS: A tracheostomy tube and right Port-A-Cath are stable. No pneumothorax. There is rounded opacity projected over the right hilum, mildly increased in the interval. This was not seen on Apr 18, 2016 and there was atelectasis in this region on the recent CT scan. Adenopathy was also seen in the right hilum on the recent CT scan. Layering bilateral effusions are again seen. Underlying  compressive atelectasis. No change in the cardiomediastinal silhouette. Suggested mild edema. IMPRESSION: 1. Increasing rounded opacity projected over the right infrahilar region correlates with adenopathy and atelectasis on a recent CT scan. 2. Mildly worsened layering effusions, right greater than left. 3. Mild edema. Electronically Signed   By: Dorise Bullion III M.D   On: 04/22/2016 07:24   Dg Chest Port 1 View  04/21/2016  CLINICAL DATA:  Postop chest, pericardial effusion EXAM: PORTABLE CHEST 1 VIEW COMPARISON:  CT chest dated 04/20/2016 FINDINGS: Patient is rotated. Tracheostomy in satisfactory position. The heart is top-normal in size. Mild perihilar edema. Small left pleural effusion. Mild right infrahilar opacity, likely atelectasis. Right chest port terminates the cavoatrial junction. IMPRESSION: Mild perihilar edema with small left pleural effusion. Electronically Signed   By: Julian Hy M.D.   On: 04/21/2016 09:43   Dg Knee Complete 4 Views Left  04/24/2016  CLINICAL DATA:  Anterior left knee pain when bearing weight. No known injuries. EXAM: LEFT KNEE - COMPLETE 4+ VIEW COMPARISON:  None. FINDINGS: Irregular periosteal reaction involving the anterior, lateral and medial (predominantly anterior) aspect of the distal femoral metaphysis. No evidence of acute or subacute fracture or dislocation. Joint spaces well preserved. Bone mineral density well-preserved. No visible joint effusion. IMPRESSION: Periostitis involving the distal femur. Differential diagnosis might include osteomyelitis, neoplasm, and hypertrophic pulmonary osteoarthropathy. Electronically Signed   By: Evangeline Dakin M.D.   On: 04/24/2016 11:11    Microbiology: Recent Results (from the past 240 hour(s))  Culture, blood (routine x 2) Call MD if unable to obtain prior to antibiotics being given     Status: None   Collection Time: 04/20/16  4:37 PM  Result Value Ref Range Status   Specimen Description BLOOD LEFT  ANTECUBITAL  Final   Special Requests BOTTLES DRAWN AEROBIC AND ANAEROBIC 5CC  Final   Culture   Final    NO GROWTH 5 DAYS Performed at [facility]    Report Status 04/25/2016 FINAL  Final  Culture, blood (routine x 2) Call MD if unable to obtain prior to antibiotics being given     Status: None   Collection Time: 04/20/16  4:37 PM  Result Value Ref Range Status   Specimen Description BLOOD RIGHT ARM  Final   Special Requests BOTTLES DRAWN AEROBIC AND ANAEROBIC 10CC  Final   Culture   Final    NO GROWTH 5 DAYS Performed at [facility]    Report Status 04/25/2016 FINAL  Final  Culture, sputum-assessment     Status: None   Collection Time: 04/20/16  8:32 PM  Result Value Ref Range Status   Specimen Description SPU  Final   Special Requests NONE  Final   Sputum evaluation THIS SPECIMEN IS ACCEPTABLE FOR SPUTUM CULTURE  Final   Report Status 04/20/2016 FINAL  Final  Culture, respiratory (NON-Expectorated)  Status: None   Collection Time: 04/20/16  8:32 PM  Result Value Ref Range Status   Specimen Description SPUTUM  Final   Special Requests NONE  Final   Gram Stain   Final    MODERATE WBC PRESENT, PREDOMINANTLY PMN FEW SQUAMOUS EPITHELIAL CELLS PRESENT FEW GRAM NEGATIVE RODS FEW GRAM POSITIVE RODS FEW GRAM POSITIVE COCCI IN PAIRS IN CHAINS IN CLUSTERS THIS SPECIMEN IS ACCEPTABLE FOR SPUTUM CULTURE Performed at [facility]    Culture   Final    NORMAL OROPHARYNGEAL FLORA Performed at [facility]    Report Status 04/23/2016 FINAL  Final  Surgical PCR screen     Status: Abnormal   Collection Time: 04/21/16  1:00 AM  Result Value Ref Range Status   MRSA, PCR NEGATIVE NEGATIVE Final   Staphylococcus aureus POSITIVE (A) NEGATIVE Final    Comment:        The Xpert SA Assay (FDA approved for NASAL specimens in patients over 85 years of age), is one component of a comprehensive surveillance program.  Test performance has been validated by  [facility] for patients greater than or equal to 58 year old. It is not intended to diagnose infection nor to guide or monitor treatment.   Culture, body fluid-bottle     Status: None (Preliminary result)   Collection Time: 04/21/16  8:21 AM  Result Value Ref Range Status   Specimen Description FLUID PERICARDIAL  Final   Special Requests NONE  Final   Culture NO GROWTH 4 DAYS  Final   Report Status PENDING  Incomplete  Gram stain     Status: None   Collection Time: 04/21/16  8:21 AM  Result Value Ref Range Status   Specimen Description FLUID PERICARDIAL  Final   Special Requests NONE  Final   Gram Stain   Final    MODERATE WBC PRESENT,BOTH PMN AND MONONUCLEAR NO ORGANISMS SEEN    Report Status 04/21/2016 FINAL  Final     Labs: Basic Metabolic Panel:  Recent Labs Lab 04/21/16 0440 04/22/16 0307 04/24/16 0500 04/25/16 0438 04/26/16 0400  NA 139 138 135 140 141  K 4.7 3.6 3.9 4.4 3.9  CL 104 107 105 106 106  CO2 '22 23 25 26 27  '$ GLUCOSE 158* 88 117* 133* 102*  BUN 28* 19 <5* 5* 7  CREATININE 1.07 0.71 0.51* 0.52* 0.55*  CALCIUM 8.5* 7.6* 7.8* 8.3* 8.1*   Liver Function Tests:  Recent Labs Lab 04/22/16 0307 04/23/16 0530 04/24/16 0500 04/25/16 0438 04/26/16 0400  AST 525* 143* 103* 81* 61*  ALT 1035* 629* 512* 365* 285*  ALKPHOS 90 81 93 95 93  BILITOT 1.0 1.0 0.9 0.7 0.6  PROT 4.7* 4.7* 5.6* 5.0* 4.9*  ALBUMIN 2.1* 2.0* 2.2* 2.0* 2.1*   No results for input(s): LIPASE, AMYLASE in the last 168 hours. No results for input(s): AMMONIA in the last 168 hours. CBC:  Recent Labs Lab 04/20/16 1640 04/21/16 0440 04/22/16 0307 04/23/16 0530 04/24/16 0500 04/25/16 0438 04/26/16 0400  WBC 13.5* 15.4* 7.6 7.0 6.7 9.8 9.8  NEUTROABS 11.2* 12.5*  --   --   --   --   --   HGB 12.7* 11.8* 10.4* 10.8* 11.8* 11.2* 10.6*  HCT 38.2* 37.3* 33.2* 33.4* 37.1* 35.2* 33.1*  MCV 85.8 88.4 89.0 88.1 87.3 87.6 87.6  PLT 297 219 153 139* 138* 178 171   Cardiac  Enzymes: No results for input(s): CKTOTAL, CKMB, CKMBINDEX, TROPONINI in the last 168  hours. BNP: BNP (last 3 results) No results for input(s): BNP in the last 8760 hours.  ProBNP (last 3 results) No results for input(s): PROBNP in the last 8760 hours.  CBG:  Recent Labs Lab 04/21/16 0549 04/21/16 0911 04/21/16 1138 04/21/16 1509  GLUCAP 142* 149* 109* 108*       Signed:  Alexarae Oliva  [facility] 04/26/2016, 12:53 PM

## 2016-04-26 NOTE — Discharge Instructions (Signed)
Respiratory failure is when your lungs are not working well and your breathing (respiratory) system fails. When respiratory failure occurs, it is difficult for your lungs to get enough oxygen, get rid of carbon dioxide, or both. Respiratory failure can be life threatening.  °Respiratory failure can be acute or chronic. Acute respiratory failure is sudden, severe, and requires emergency medical treatment. Chronic respiratory failure is less severe, happens over time, and requires ongoing treatment.  °WHAT ARE THE CAUSES OF ACUTE RESPIRATORY FAILURE?  °Any problem affecting the heart or lungs can cause acute respiratory failure. Some of these causes include the following: °· Chronic bronchitis and emphysema (COPD).   °· Blood clot going to a lung (pulmonary embolism).   °· Having water in the lungs caused by heart failure, lung injury, or infection (pulmonary edema).   °· Collapsed lung (pneumothorax).   °· Pneumonia.   °· Pulmonary fibrosis.   °· Obesity.   °· Asthma.   °· Heart failure.   °· Any type of trauma to the chest that can make breathing difficult.   °· Nerve or muscle diseases making chest movements difficult. °HOW WILL MY ACUTE RESPIRATORY FAILURE BE TREATED?  °Treatment of acute respiratory failure depends on the cause of the respiratory failure. Usually, you will stay in the intensive care unit so your breathing can be watched closely. Treatment can include the following: °· Oxygen. Oxygen can be delivered through the following: °¨ Nasal cannula. This is small tubing that goes in your nose to give you oxygen. °¨ Face mask. A face mask covers your nose and mouth to give you oxygen. °· Medicine. Different medicines can be given to help with breathing. These can include: °¨ Nebulizers. Nebulizers deliver medicines to open the air passages (bronchodilators). These medicines help to open or relax the airways in the lungs so you can breathe better. They can also help loosen mucus from your  lungs. °¨ Diuretics. Diuretic medicines can help you breathe better by getting rid of extra water in your body. °¨ Steroids. Steroid medicines can help decrease swelling (inflammation) in your lungs. °¨ Antibiotics. °· Chest tube. If you have a collapsed lung (pneumothorax), a chest tube is placed to help reinflate the lung. °· Noninvasive positive pressure ventilation (NPPV). This is a tight-fitting mask that goes over your nose and mouth. The mask has tubing that is attached to a machine. The machine blows air into the tubing, which helps to keep the tiny air sacs (alveoli) in your lungs open. This machine allows you to breathe on your own. °· Ventilator. A ventilator is a breathing machine. When on a ventilator, a breathing tube is put into the lungs. A ventilator is used when you can no longer breathe well enough on your own. You may have low oxygen levels or high carbon dioxide (CO2) levels in your blood. When you are on a ventilator, sedation and pain medicines are given to make you sleep so your lungs can heal. °SEEK IMMEDIATE MEDICAL CARE IF: °· You have shortness of breath (dyspnea) with or without activity. °· You have rapid breathing (tachypnea). °· You are wheezing. °· You are unable to say more than a few words without having to catch your breath. °· You find it very difficult to function normally. °· You have a fast heart rate. °· You have a bluish color to your finger or toe nail beds. °· You have confusion or drowsiness or both. °  °This information is not intended to replace advice given to you by your health care provider. Make sure you discuss   any questions you have with your health care provider. °  °Document Released: 12/02/2013 Document Revised: 08/18/2015 Document Reviewed: 12/02/2013 °Elsevier Interactive Patient Education ©2016 [employer] ° °

## 2016-04-27 ENCOUNTER — Other Ambulatory Visit: Payer: BLUE CROSS/BLUE SHIELD

## 2016-04-27 ENCOUNTER — Ambulatory Visit: Payer: BLUE CROSS/BLUE SHIELD | Admitting: Nurse Practitioner

## 2016-04-27 ENCOUNTER — Ambulatory Visit: Payer: BLUE CROSS/BLUE SHIELD

## 2016-05-01 ENCOUNTER — Inpatient Hospital Stay (HOSPITAL_COMMUNITY)
Admission: EM | Admit: 2016-05-01 | Discharge: 2016-05-02 | DRG: 181 | Disposition: A | Discharge status: Home / Self Care | Payer: BLUE CROSS/BLUE SHIELD | Attending: Family Medicine | Admitting: Family Medicine

## 2016-05-01 ENCOUNTER — Telehealth: Payer: Self-pay | Admitting: *Deleted

## 2016-05-01 ENCOUNTER — Emergency Department (HOSPITAL_COMMUNITY): Payer: BLUE CROSS/BLUE SHIELD

## 2016-05-01 ENCOUNTER — Encounter (HOSPITAL_COMMUNITY): Payer: Self-pay | Admitting: Emergency Medicine

## 2016-05-01 DIAGNOSIS — R222 Localized swelling, mass and lump, trunk: Secondary | ICD-10-CM | POA: Diagnosis present

## 2016-05-01 DIAGNOSIS — Z9049 Acquired absence of other specified parts of digestive tract: Secondary | ICD-10-CM

## 2016-05-01 DIAGNOSIS — R6510 Systemic inflammatory response syndrome (SIRS) of non-infectious origin without acute organ dysfunction: Secondary | ICD-10-CM

## 2016-05-01 DIAGNOSIS — Z803 Family history of malignant neoplasm of breast: Secondary | ICD-10-CM

## 2016-05-01 DIAGNOSIS — C7802 Secondary malignant neoplasm of left lung: Principal | ICD-10-CM | POA: Diagnosis present

## 2016-05-01 DIAGNOSIS — G4700 Insomnia, unspecified: Secondary | ICD-10-CM | POA: Diagnosis present

## 2016-05-01 DIAGNOSIS — K219 Gastro-esophageal reflux disease without esophagitis: Secondary | ICD-10-CM | POA: Diagnosis present

## 2016-05-01 DIAGNOSIS — I480 Paroxysmal atrial fibrillation: Secondary | ICD-10-CM | POA: Diagnosis present

## 2016-05-01 DIAGNOSIS — R590 Localized enlarged lymph nodes: Secondary | ICD-10-CM | POA: Diagnosis present

## 2016-05-01 DIAGNOSIS — C155 Malignant neoplasm of lower third of esophagus: Secondary | ICD-10-CM | POA: Diagnosis present

## 2016-05-01 DIAGNOSIS — R079 Chest pain, unspecified: Secondary | ICD-10-CM | POA: Diagnosis present

## 2016-05-01 DIAGNOSIS — Z841 Family history of disorders of kidney and ureter: Secondary | ICD-10-CM

## 2016-05-01 DIAGNOSIS — D638 Anemia in other chronic diseases classified elsewhere: Secondary | ICD-10-CM | POA: Diagnosis present

## 2016-05-01 DIAGNOSIS — Z8501 Personal history of malignant neoplasm of esophagus: Secondary | ICD-10-CM

## 2016-05-01 DIAGNOSIS — Z923 Personal history of irradiation: Secondary | ICD-10-CM

## 2016-05-01 DIAGNOSIS — G4730 Sleep apnea, unspecified: Secondary | ICD-10-CM | POA: Diagnosis present

## 2016-05-01 DIAGNOSIS — E119 Type 2 diabetes mellitus without complications: Secondary | ICD-10-CM | POA: Diagnosis present

## 2016-05-01 DIAGNOSIS — Z79899 Other long term (current) drug therapy: Secondary | ICD-10-CM

## 2016-05-01 DIAGNOSIS — J9809 Other diseases of bronchus, not elsewhere classified: Secondary | ICD-10-CM | POA: Diagnosis present

## 2016-05-01 DIAGNOSIS — Z86010 Personal history of colonic polyps: Secondary | ICD-10-CM

## 2016-05-01 DIAGNOSIS — Z833 Family history of diabetes mellitus: Secondary | ICD-10-CM

## 2016-05-01 DIAGNOSIS — R0600 Dyspnea, unspecified: Secondary | ICD-10-CM

## 2016-05-01 DIAGNOSIS — Z8249 Family history of ischemic heart disease and other diseases of the circulatory system: Secondary | ICD-10-CM

## 2016-05-01 DIAGNOSIS — C159 Malignant neoplasm of esophagus, unspecified: Secondary | ICD-10-CM

## 2016-05-01 DIAGNOSIS — I890 Lymphedema, not elsewhere classified: Secondary | ICD-10-CM

## 2016-05-01 DIAGNOSIS — Z800 Family history of malignant neoplasm of digestive organs: Secondary | ICD-10-CM

## 2016-05-01 DIAGNOSIS — Z9889 Other specified postprocedural states: Secondary | ICD-10-CM

## 2016-05-01 DIAGNOSIS — F329 Major depressive disorder, single episode, unspecified: Secondary | ICD-10-CM | POA: Diagnosis present

## 2016-05-01 DIAGNOSIS — F419 Anxiety disorder, unspecified: Secondary | ICD-10-CM | POA: Diagnosis present

## 2016-05-01 LAB — BASIC METABOLIC PANEL
ANION GAP: 8 (ref 5–15)
BUN: 7 mg/dL (ref 6–20)
CALCIUM: 8.9 mg/dL (ref 8.9–10.3)
CHLORIDE: 104 mmol/L (ref 101–111)
CO2: 25 mmol/L (ref 22–32)
Creatinine, Ser: 0.73 mg/dL (ref 0.61–1.24)
GFR calc non Af Amer: >60 mL/min (ref >60)
Glucose, Bld: 111 mg/dL — ABNORMAL HIGH (ref 65–99)
POTASSIUM: 4 mmol/L (ref 3.5–5.1)
Sodium: 137 mmol/L (ref 135–145)

## 2016-05-01 LAB — I-STAT TROPONIN, ED: TROPONIN I, POC: 0.01 ng/mL (ref 0.00–0.08)

## 2016-05-01 LAB — CBC
HCT: 40.5 % (ref 39.0–52.0)
HEMOGLOBIN: 12.6 g/dL — AB (ref 13.0–17.0)
MCH: 27 pg (ref 26.0–34.0)
MCHC: 31.1 g/dL (ref 30.0–36.0)
MCV: 86.7 fL (ref 78.0–100.0)
Platelets: 366 10*3/uL (ref 150–400)
RBC: 4.67 MIL/uL (ref 4.22–5.81)
RDW: 14.8 % (ref 11.5–15.5)
WBC: 10 10*3/uL (ref 4.0–10.5)

## 2016-05-01 LAB — URINALYSIS, ROUTINE W REFLEX MICROSCOPIC
BILIRUBIN URINE: NEGATIVE
Glucose, UA: NEGATIVE mg/dL
Hgb urine dipstick: NEGATIVE
KETONES UR: 15 mg/dL — AB
LEUKOCYTES UA: NEGATIVE
NITRITE: NEGATIVE
PH: 8 (ref 5.0–8.0)
PROTEIN: NEGATIVE mg/dL
Specific Gravity, Urine: 1.028 (ref 1.005–1.030)

## 2016-05-01 MED ORDER — SODIUM CHLORIDE 0.9 % IV BOLUS (SEPSIS)
1000.0000 mL | Freq: Once | INTRAVENOUS | Status: AC
  Administered 2016-05-01: 1000 mL via INTRAVENOUS

## 2016-05-01 MED ORDER — IOPAMIDOL (ISOVUE-370) INJECTION 76%
INTRAVENOUS | Status: AC
  Administered 2016-05-01: 100 mL
  Filled 2016-05-01: qty 100

## 2016-05-01 MED ORDER — PIPERACILLIN-TAZOBACTAM 3.375 G IVPB 30 MIN
3.3750 g | INTRAVENOUS | Status: AC
  Administered 2016-05-02: 3.375 g via INTRAVENOUS
  Filled 2016-05-01: qty 50

## 2016-05-01 MED ORDER — LORAZEPAM 1 MG PO TABS
1.0000 mg | ORAL_TABLET | Freq: Once | ORAL | Status: AC
  Administered 2016-05-01: 1 mg via ORAL
  Filled 2016-05-01: qty 1

## 2016-05-01 MED ORDER — SODIUM CHLORIDE 0.9 % IV BOLUS (SEPSIS)
1000.0000 mL | Freq: Once | INTRAVENOUS | Status: AC
  Administered 2016-05-02: 1000 mL via INTRAVENOUS

## 2016-05-01 MED ORDER — BENZONATATE 100 MG PO CAPS
100.0000 mg | ORAL_CAPSULE | Freq: Once | ORAL | Status: AC
  Administered 2016-05-01: 100 mg via ORAL
  Filled 2016-05-01: qty 1

## 2016-05-01 MED ORDER — PIPERACILLIN-TAZOBACTAM 4.5 G IVPB: 4.5000 g | Freq: Once | INTRAVENOUS | Status: DC

## 2016-05-01 NOTE — Progress Notes (Signed)
Placed patient on 28% aerosol trach collar. Patient tolerating it well, RT will continue to monitor.

## 2016-05-01 NOTE — ED Notes (Signed)
Pt reports SOB and CP over the weekend and continuing to worsen. Pt had a pericardial window performed due to a pericardial effusion 5 days ago. Pt alert x4.

## 2016-05-01 NOTE — Progress Notes (Signed)
Patient refused use of humidity here at hospital, stating that he does not wear it at home. RT will continue to monitor.

## 2016-05-01 NOTE — ED Provider Notes (Signed)
CSN: [id number]     Arrival date & time 05/01/16  1350 History   First MD Initiated Contact with Patient 05/01/16 1521     Chief Complaint  Patient presents with  . Chest Pain  . Shortness of Breath     (Consider location/radiation/quality/duration/timing/severity/associated sxs/prior Treatment) HPI Comments: Joel Osborne is a 50 y.o. male with history of cancer actively seeking treatment, anemia, atrial fibrillation, and pericardial effusion status post pericardial window 1 week presents to ED with cough, shortness of breath, and chest pain. Patient was diagnosed with pericardial effusion with cardiac tamponade on 04/20/16 per patient etiology competition of cancer. He underwent pericardial window on 04/21/16. Hospital course complicated by pneumonia and atrial fibrillation with RVR. Patient was discharged on 04/26/16 and was improving. Patient endorses a productive cough starting on Saturday. Sputum is clear and phlegm-like in nature. Denies hemoptysis. He noted substernal and right-sided chest pain on Sunday. Pain is constant, stabbing, and worsening. Denies radiation. Pain is worse with cough and movement. Tenderness to palpation chest wall. Patient noted shortness of breath with onset today. Worse with activity. Patient denies fevers, chills, or night sweats. Denies lower leg swelling or pain. Denies long distance travel. Patient has current esophageal cancer and is actively in treatment. No history of blood clots. Denies current use of blood thinners.  Patient is a 50 y.o. male presenting with chest pain and shortness of breath. The history is provided by the patient, a relative and medical records.  Chest Pain Associated symptoms: abdominal pain, cough ( productive with clear phlegm. No hemoptysis. ), dysphagia ( chronic secondary to esophageal cancer) and shortness of breath   Associated symptoms: no diaphoresis, no dizziness, no fever, no nausea, no palpitations and not vomiting     Shortness of Breath Associated symptoms: abdominal pain, chest pain and cough ( productive with clear phlegm. No hemoptysis. )   Associated symptoms: no diaphoresis, no fever, no neck pain and no vomiting     Past Medical History  Diagnosis Date  . Hyperlipemia   . Colon polyp   . Pre-diabetes   . GERD (gastroesophageal reflux disease)   . History of radiation therapy 04/07/13-05-15-13    [facility] ca,50.4Gy/20f [facility]  . Sleep apnea     no c-pap at this time  . Anxiety   . [place]     [place]  [place]    Adenocarcinoma  . Diverticulosis    Past Surgical History  Procedure Laterality Date  . Appendectomy    . Lipoma excision    . Edg  03/18/2013    Esophageal Mass  . Complete esophagectomy N/A 07/07/2013    Procedure: ESOPHAGECTOMY COMPLETE;  Surgeon: EGrace Isaac MD;  [place] [facility]  Service: Thoracic;  Laterality: N/A;  transhiatel esophagectomy, feeding jejunostomy  . Video bronchoscopy N/A 07/07/2013    Procedure: VIDEO BRONCHOSCOPY;  Surgeon: EGrace Isaac MD;  [facility]  Service: Thoracic;  Laterality: N/A;  . Eus N/A 06/25/2014    Procedure: UPPER ENDOSCOPIC ULTRASOUND (EUS) LINEAR;  Surgeon: DMilus Banister MD;  [facility];  Service: Endoscopy;  Laterality: N/A;  . Esophagogastroduodenoscopy N/A 07/02/2014    Procedure: ESOPHAGOGASTRODUODENOSCOPY (EGD);  Surgeon: CGatha Mayer MD;  [facility];  Service: Endoscopy;  Laterality: N/A;  . Vocal chord surgery      pushed right vocal cord more centered- September 2015  . Esophagogastroduodenoscopy N/A 02/16/2015  Procedure: ESOPHAGOGASTRODUODENOSCOPY (EGD);  Surgeon: Lafayette Dragon, MD;  [facility];  Service: Endoscopy;  Laterality: N/A;  . Upper gastrointestinal endoscopy    . Colonoscopy    . Esophagogastroduodenoscopy N/A 09/29/2015    Procedure:  ESOPHAGOGASTRODUODENOSCOPY (EGD);  Surgeon: Ladene Artist, MD;  [facility];  Service: Endoscopy;  Laterality: N/A;  . Subxyphoid pericardial window N/A 04/21/2016    Procedure: SUBXYPHOID PERICARDIAL WINDOW;  Surgeon: Melrose Nakayama, MD;  [facility];  Service: Thoracic;  Laterality: N/A;   Family History  Problem Relation Age of Onset  . Colon cancer Mother     dx in her 28s  . Diabetes Mother   . Breast cancer Maternal Aunt 81  . Diabetes Maternal Aunt   . Esophageal cancer Maternal Aunt 78  . Kidney failure Brother 86  . Stomach cancer Neg Hx   . Rectal cancer Neg Hx   . Hypertension Brother 20   Social History  Substance Use Topics  . Smoking status: Never Smoker   . Smokeless tobacco: Never Used  . Alcohol Use: 1.2 oz/week    2 Cans of beer per week    Review of Systems  Constitutional: Negative for fever, chills and diaphoresis.  HENT: Positive for trouble swallowing ( chronic secondary to esophageal cancer).   Eyes: Negative for visual disturbance.  Respiratory: Positive for cough ( productive with clear phlegm. No hemoptysis. ) and shortness of breath.   Cardiovascular: Positive for chest pain. Negative for palpitations and leg swelling.  Gastrointestinal: Positive for abdominal pain. Negative for nausea, vomiting, diarrhea, constipation and blood in stool.  Genitourinary: Negative for dysuria and hematuria.  Musculoskeletal: Negative for neck pain and neck stiffness.  Allergic/Immunologic: Positive for immunocompromised state.  Neurological: Positive for light-headedness. Negative for dizziness and syncope.  All other systems reviewed and are negative.     Allergies  Lactose intolerance (gi)  Home Medications   Prior to Admission medications   Medication Sig Start Date End Date Taking? Authorizing Provider  acetaminophen (TYLENOL) 500 MG tablet Take 1,000 mg by mouth every 6 (six) hours as needed for mild pain or moderate pain.   Yes  Historical Provider, MD  anti-nausea (EMETROL) solution Take 10 mLs by mouth every 15 (fifteen) minutes as needed for nausea or vomiting.   Yes Historical Provider, MD  benzonatate (TESSALON) 100 MG capsule Take 1 capsule (100 mg total) by mouth 3 (three) times daily as needed for cough. 04/26/16  Yes Maryann Mikhail, DO  cefdinir (OMNICEF) 250 MG/5ML suspension Take 6 mLs by mouth 2 (two) times daily. For 7 days. Started medication 04/26/16 04/26/16  Yes Historical Provider, MD  eszopiclone (LUNESTA) 1 MG TABS tablet TAKE 1-2 TABLETS BY MOUTH AT BEDTIME AS NEEDED FOR SLEEP 02/22/16  Yes Ladell Pier, MD  ibuprofen (ADVIL,MOTRIN) 200 MG tablet Take 200 mg by mouth every 8 (eight) hours as needed.   Yes Historical Provider, MD  lactase (LACTAID) 3000 UNITS tablet Take 3,000 Units by mouth daily as needed (lactose intolerance). For lactose intolerance   Yes Historical Provider, MD  lidocaine-prilocaine (EMLA) cream Apply 1 application topically as needed. Apply tablespoon to PAC 2 hours prior to stick and cover with plastic wrap 11/09/15  Yes Ladell Pier, MD  mirtazapine (REMERON) 15 MG tablet TAKE ONE TABLET BY MOUTH AT BEDTIME 02/16/16  Yes Ladell Pier, MD  Multiple Vitamin (MULTIVITAMIN WITH MINERALS) TABS tablet Take 1 tablet by mouth daily.   Yes Historical Provider, MD  brompheniramine-pseudoephedrine (DIMETAPP) 1-15 MG/5ML ELIX Take by mouth 2 (two) times daily as needed for allergies.    Historical Provider, MD  dextromethorphan-guaiFENesin (MUCINEX DM) 30-600 MG 12hr tablet Take 1 tablet by mouth 2 (two) times daily. Reported on 04/20/2016    Historical Provider, MD  guaiFENesin (ROBITUSSIN) 100 MG/5ML liquid Take 200 mg by mouth 3 (three) times daily as needed for cough.    Historical Provider, MD   BP 99/62 mmHg  Pulse 99  Temp(Src) 99.2 F (37.3 C) (Oral)  Resp 32  SpO2 95% Physical Exam  Constitutional: He appears well-developed and well-nourished. No distress.  HENT:  Head:  Normocephalic and atraumatic.  Mouth/Throat: Uvula is midline, oropharynx is clear and moist and mucous membranes are normal. No trismus in the jaw. No uvula swelling. No oropharyngeal exudate, posterior oropharyngeal edema, posterior oropharyngeal erythema or tonsillar abscesses.  Eyes: Conjunctivae and EOM are normal. Pupils are equal, round, and reactive to light. Right eye exhibits no discharge. Left eye exhibits no discharge. No scleral icterus.  Neck: Normal range of motion. Neck supple. No JVD present. No spinous process tenderness and no muscular tenderness present. No rigidity. No tracheal deviation and normal range of motion present.  Tracheostomy noted.   Cardiovascular: Regular rhythm, normal heart sounds and intact distal pulses.  Tachycardia present.   No murmur heard. Pulmonary/Chest: Breath sounds normal. Tachypnea noted. No respiratory distress. He exhibits tenderness.  Port-a-cath noted in right upper anterior chest. TTP of right and left chest wall, worse in anterior axillary region b/l.   Abdominal: Soft. Normal appearance and bowel sounds are normal. He exhibits no distension. There is no tenderness ( TTP of epigastric). There is no rebound and no guarding.  Musculoskeletal: Normal range of motion. He exhibits no edema or tenderness.  Lymphadenopathy:    He has no cervical adenopathy.  Neurological: He is alert. Coordination normal.  Skin: Skin is warm and dry. He is not diaphoretic.     Well healing incisions sites noted in epigastric. No purulent discharge. No redness, warmth, or swelling noted.   Psychiatric: He has a normal mood and affect. His behavior is normal.    ED Course  Procedures (including critical care time) Labs Review Labs Reviewed  BASIC METABOLIC PANEL - Abnormal; Notable for the following:    Glucose, Bld 111 (*)    All other components within normal limits  CBC - Abnormal; Notable for the following:    Hemoglobin 12.6 (*)    All other components  within normal limits  URINALYSIS, ROUTINE W REFLEX MICROSCOPIC (NOT AT [facility]) - Abnormal; Notable for the following:    APPearance CLOUDY (*)    Ketones, ur 15 (*)    All other components within normal limits  URINE CULTURE  CULTURE, BLOOD (ROUTINE X 2)  CULTURE, BLOOD (ROUTINE X 2)  LACTIC ACID, PLASMA  LACTIC ACID, PLASMA  I-STAT TROPOININ, ED    Imaging Review Dg Chest 2 View  05/01/2016  CLINICAL DATA:  Chest Pain Sob Cough Worse Today Surgery 1 Week Ago For effusions EXAM: CHEST  2 VIEW COMPARISON:  04/24/2016 FINDINGS: Right-sided Port-A-Cath terminates at the low SVC versus cavoatrial junction. Tracheostomy. Midline trachea. Normal heart size. Small bilateral pleural effusions are decreased. No pneumothorax. No congestive failure. Improved bibasilar and right infrahilar airspace disease. IMPRESSION: Improved aeration with decreased tiny pleural effusions and lower lobe predominant airspace disease, likely atelectasis. Electronically Signed   By: Abigail Miyamoto M.D.   On: 05/01/2016 14:17   Ct Angio  Chest Pe W/cm &/or Wo Cm  05/01/2016  CLINICAL DATA:  Shortness of breath and mid chest pain radiating to the right side. History of esophageal cancer in 2014. EXAM: CT ANGIOGRAPHY CHEST WITH CONTRAST TECHNIQUE: Multidetector CT imaging of the chest was performed using the standard protocol during bolus administration of intravenous contrast. Multiplanar CT image reconstructions and MIPs were obtained to evaluate the vascular anatomy. CONTRAST:  100 cc Isovue 370. COMPARISON:  Chest radiograph 05/01/2016, CT of the chest 04/20/2016 FINDINGS: Mediastinum/Lymph Nodes: No pulmonary emboli or thoracic aortic dissection identified. There has been interval decrease in the previously identified pericardial effusion with maximum thickness now measuring 8 mm. The heart is otherwise normal in size. There are stable postsurgical changes from esophagectomy at gastric pull-through. Tracheostomy tube is in place.  Right internal jugular approach injectable port terminates in the superior vena cava. There are stable to slightly worsened hilar lymphadenopathy versus metastatic masses with a nodal mass in the right hilum compressing the takeoff of the right lower lobe pulmonary artery to a half of its normal caliber. The right middle lobe pulmonary artery is compressed to a sliver. There is an associated near complete obliteration of the right middle lobe bronchus, and mild narrowing of the right upper and lower lobe bronchi. There is a 10 mm precarinal mediastinal lymph node, stable. Lungs/Pleura: There are bilateral pleural effusions, not significantly changed in size. A near complete collapse of the right middle lobe is stable. Nodular metastases in the left lower lobe is stable. There is slight increase in the nodular metastases in the lingula now measuring 15 mm. There is a new ground-glass opacity in the posterior perifissural aspect of the right upper lobe, image 58 lung windows. Upper abdomen: No acute findings. Musculoskeletal: No chest wall mass or suspicious bone lesions identified. Review of the MIP images confirms the above findings. IMPRESSION: Slight worsening of right hilar lymphadenopathy versus metastatic disease causing near complete obliteration of the right middle lobe bronchus with near complete collapse of the right middle lobe, near complete obliteration of the right middle lobe pulmonary artery, and narrowing of the right lower lobe pulmonary artery. Interval decrease in the size of the previously identified pericardial effusion. Stable bilateral pleural effusions. Interval development of apparently acute ground-glass consolidation in the right upper lobe. Slight increase in the nodular metastatic disease in the lingula. Stable left lower lobe pulmonary metastatic disease. No evidence of pulmonary embolus or thoracic dissection. Electronically Signed   By: Fidela Salisbury M.D.   On: 05/01/2016 18:53     I have personally reviewed and evaluated these images and lab results as part of my medical decision-making.   EKG Interpretation   Date/Time:  Monday May 01 2016 13:56:32 EDT Ventricular Rate:  115 PR Interval:  124 QRS Duration: 74 QT Interval:  326 QTC Calculation: 450 R Axis:   28 Text Interpretation:  Sinus tachycardia T wave abnormality, consider  inferolateral ischemia Abnormal ECG Since last tracing T wave inversion  now present diffusely Confirmed by MILLER  MD, BRIAN ([id number]) on 05/01/2016  4:34:04 PM      MDM   Final diagnoses:  Malignant neoplasm of esophagus, unspecified location ([place])  Dyspnea  SIRS (systemic inflammatory response syndrome) (HCC)   Joel Osborne is a 50 y.o. male with history of esophageal cancer currently undergoing treatment, anemia, atrial fibrillation, and pericardial effusion with cardiac tamponade s/p pericardial window presents to ED with complaint of shortness of breath, chest pain, and cough. Patient was d/c'd from  hospital on 04/26/16 following pericardial window for pericardial effusion and cardiac tamponade secondary to malignancy.   Patient is ill-appearing with tracheostomy. Patient is tachycardic, tachypneic, with soft blood pressure, and O2 sats in mid-90's. EKD shows sinus tachycardia with t-wave abnormality. Troponin negative. BMP unremarkable. CBC remarkable for hgb 12.8; however, improving from previous labs. CXR shows improved aeration and decrease pleural effusions, Port-A-Cath location appropriate. CT angio chest negative for pulmonary embolism or thoracic dissection and improvement in pericardial effusion; however, progression of malignancy - "near complete obliteration" of right middle lobe artery, near collapse of right middle lobe, and narrowing or right lower lobe pulmonary artery. Will consult oncology for further guidance.    9:14PM: Consulted oncology. Will notify Dr. Benay Spice of patient's status and will follow up during  hospital admission. Recommend consulting CT surgery to evaluate if surgical management available to improve pulmonary artery blood flow.     9:30PM: CT surgery consulted by Dr. Sabra Heck. Unfortunately, no surgical management available per CT surgery - consider palliative care. Will consult Pulmonology Critical Care for further guidance. Patient BP 82/60, tachycardic, O2sats 93%, and tachypneic. 1L fluid bolus initiated. [place] 2L oxygen initiated.   10:15PM: Pulmonary critical care consulted. Recommend sputum culture and ABX. May benefit from bronchodilators. Consider palliative care. Will consult [facility] for admission for management of abnormal vital signs and symptomatic relief.     10:40PM: Discussed with patient and family the results and current status of patient. Patient and family understandably upset. Discussed plan, patient and family voiced understanding.   10:50PM: Dr. Sabra Heck consulted with [facility], Dr. Myna Hidalgo. [facility] will admit for abnormal vital signs and symptomatic relief. Oncology to consult in the morning for further evaluation. Additional 1L fluid. Ordered lactic acid, urine, urine culture, blood cultures, and started on IV ABX for possible PNA.   Roxanna Mew, PA-C 05/02/16 0025  Noemi Chapel, MD 05/02/16 [phone]

## 2016-05-01 NOTE — Telephone Encounter (Signed)
Message from pt's wife reporting he is having chest pain. He thinks fluid has "built back up" and he feels like he needs to be in the hospital. Reviewed with Dr. Benay Spice: Pt should be evaluated in ED. Returned call to wife with above instructions. She stated she will take him to [facility] ED.

## 2016-05-01 NOTE — ED Provider Notes (Signed)
The patient is a 50 year old male, he has been diagnosed with an esophageal cancer, there is some metastatic disease to his lungs, he has had a tracheostomy in about one week ago he had a pericardial window for a large pericardial effusion which was causing chest pain shortness of breath. Over the last week since being out of the hospital he has developed increased amounts of coughing with some phlegm production, no fevers but has some increased shortness of breath and some chest pain as well. He denies swelling of his legs. The symptoms are persistent and gradually worsening.  On exam the patient has clear lung sounds, there are slight rales at the bases that do clear with coughing though he does have a slightly weak cough. His heart sounds are regular, he is not tachycardic, he has strong pulses and no asymmetry or edema of the legs.  The patient is at high risk for pulmonary embolism as well as aspiration pneumonia, his x-ray shows decreased effusions, his cardiac size is appropriate, his oxygenation is also appropriate, CT scan of the chest will be ordered to further evaluate for possible pneumonia versus pulmonary embolism versus other source of his increased cough and shortness of breath.  Care was discussed with cardiothoracic surgery as well as pulmonary critical care, neither of which have solutions to the problem and appropriately recommend palliative care, oncology consultation and inpatient management of the patient's abnormal vital signs and supportive care for symptomatically relief. I discussed the care with the hospitalist who will admit the patient to the hospital and recommends telemetry.  The patient had decompensation in his clinical status, this could be be related to vascular obstruction, pulmonary obstruction, pulmonary infection though he does not have leukocytosis or fever. Because of his hypotension, tachycardia we added IV fluids, broad-spectrum antibiotic, cultures and lactic  acid. The patient does appear critically ill. Though his end status may be that he is palliative after this admission at this time we will give him the benefit of the doubt and treat aggressively for possible infection and hypotension. The patient does appear critically ill  CRITICAL CARE Performed by: Johnna Acosta Total critical care time: 35 minutes Critical care time was exclusive of separately billable procedures and treating other patients. Critical care was necessary to treat or prevent imminent or life-threatening deterioration. Critical care was time spent personally by me on the following activities: development of treatment plan with patient and/or surrogate as well as nursing, discussions with consultants, evaluation of patient's response to treatment, examination of patient, obtaining history from patient or surrogate, ordering and performing treatments and interventions, ordering and review of laboratory studies, ordering and review of radiographic studies, pulse oximetry and re-evaluation of patient's condition.   Dr. Myna Hidalgo to admit   EKG Interpretation  Date/Time:  Monday May 01 2016 13:56:32 EDT Ventricular Rate:  115 PR Interval:  124 QRS Duration: 74 QT Interval:  326 QTC Calculation: 450 R Axis:   28 Text Interpretation:  Sinus tachycardia T wave abnormality, consider inferolateral ischemia Abnormal ECG Since last tracing T wave inversion now present diffusely Confirmed by Emlyn Maves  MD, [place] ([id number]) on 05/01/2016 4:34:04 PM      Medical screening examination/treatment/procedure(s) were conducted as a shared visit with non-physician practitioner(s) and myself.  I personally evaluated the patient during the encounter.  Clinical Impression:   Final diagnoses:  Malignant neoplasm of esophagus, unspecified location [facility])         Noemi Chapel, MD 05/02/16 1510

## 2016-05-02 ENCOUNTER — Other Ambulatory Visit: Payer: Self-pay | Admitting: *Deleted

## 2016-05-02 ENCOUNTER — Telehealth: Payer: Self-pay | Admitting: Oncology

## 2016-05-02 ENCOUNTER — Ambulatory Visit: Payer: BLUE CROSS/BLUE SHIELD | Admitting: Oncology

## 2016-05-02 DIAGNOSIS — Z800 Family history of malignant neoplasm of digestive organs: Secondary | ICD-10-CM | POA: Diagnosis not present

## 2016-05-02 DIAGNOSIS — J9809 Other diseases of bronchus, not elsewhere classified: Secondary | ICD-10-CM | POA: Diagnosis present

## 2016-05-02 DIAGNOSIS — I313 Pericardial effusion (noninflammatory): Secondary | ICD-10-CM

## 2016-05-02 DIAGNOSIS — R079 Chest pain, unspecified: Secondary | ICD-10-CM | POA: Diagnosis present

## 2016-05-02 DIAGNOSIS — D638 Anemia in other chronic diseases classified elsewhere: Secondary | ICD-10-CM | POA: Diagnosis present

## 2016-05-02 DIAGNOSIS — Z8249 Family history of ischemic heart disease and other diseases of the circulatory system: Secondary | ICD-10-CM | POA: Diagnosis not present

## 2016-05-02 DIAGNOSIS — Z79899 Other long term (current) drug therapy: Secondary | ICD-10-CM | POA: Diagnosis not present

## 2016-05-02 DIAGNOSIS — J90 Pleural effusion, not elsewhere classified: Secondary | ICD-10-CM

## 2016-05-02 DIAGNOSIS — C7802 Secondary malignant neoplasm of left lung: Secondary | ICD-10-CM | POA: Diagnosis present

## 2016-05-02 DIAGNOSIS — R0600 Dyspnea, unspecified: Secondary | ICD-10-CM | POA: Diagnosis present

## 2016-05-02 DIAGNOSIS — C159 Malignant neoplasm of esophagus, unspecified: Secondary | ICD-10-CM | POA: Diagnosis not present

## 2016-05-02 DIAGNOSIS — I480 Paroxysmal atrial fibrillation: Secondary | ICD-10-CM

## 2016-05-02 DIAGNOSIS — Z923 Personal history of irradiation: Secondary | ICD-10-CM | POA: Diagnosis not present

## 2016-05-02 DIAGNOSIS — Z803 Family history of malignant neoplasm of breast: Secondary | ICD-10-CM | POA: Diagnosis not present

## 2016-05-02 DIAGNOSIS — R0789 Other chest pain: Secondary | ICD-10-CM

## 2016-05-02 DIAGNOSIS — Z841 Family history of disorders of kidney and ureter: Secondary | ICD-10-CM | POA: Diagnosis not present

## 2016-05-02 DIAGNOSIS — F329 Major depressive disorder, single episode, unspecified: Secondary | ICD-10-CM | POA: Diagnosis present

## 2016-05-02 DIAGNOSIS — R599 Enlarged lymph nodes, unspecified: Secondary | ICD-10-CM

## 2016-05-02 DIAGNOSIS — F419 Anxiety disorder, unspecified: Secondary | ICD-10-CM | POA: Diagnosis present

## 2016-05-02 DIAGNOSIS — Z9889 Other specified postprocedural states: Secondary | ICD-10-CM | POA: Diagnosis not present

## 2016-05-02 DIAGNOSIS — R222 Localized swelling, mass and lump, trunk: Secondary | ICD-10-CM | POA: Diagnosis present

## 2016-05-02 DIAGNOSIS — Z9049 Acquired absence of other specified parts of digestive tract: Secondary | ICD-10-CM | POA: Diagnosis not present

## 2016-05-02 DIAGNOSIS — R590 Localized enlarged lymph nodes: Secondary | ICD-10-CM | POA: Diagnosis present

## 2016-05-02 DIAGNOSIS — Z833 Family history of diabetes mellitus: Secondary | ICD-10-CM | POA: Diagnosis not present

## 2016-05-02 DIAGNOSIS — K219 Gastro-esophageal reflux disease without esophagitis: Secondary | ICD-10-CM | POA: Diagnosis present

## 2016-05-02 DIAGNOSIS — I890 Lymphedema, not elsewhere classified: Secondary | ICD-10-CM

## 2016-05-02 DIAGNOSIS — E119 Type 2 diabetes mellitus without complications: Secondary | ICD-10-CM | POA: Diagnosis present

## 2016-05-02 DIAGNOSIS — C155 Malignant neoplasm of lower third of esophagus: Secondary | ICD-10-CM

## 2016-05-02 DIAGNOSIS — Z86010 Personal history of colonic polyps: Secondary | ICD-10-CM | POA: Diagnosis not present

## 2016-05-02 DIAGNOSIS — Z8501 Personal history of malignant neoplasm of esophagus: Secondary | ICD-10-CM | POA: Diagnosis not present

## 2016-05-02 DIAGNOSIS — G4700 Insomnia, unspecified: Secondary | ICD-10-CM | POA: Diagnosis present

## 2016-05-02 DIAGNOSIS — G4730 Sleep apnea, unspecified: Secondary | ICD-10-CM | POA: Diagnosis present

## 2016-05-02 LAB — EXPECTORATED SPUTUM ASSESSMENT W GRAM STAIN, RFLX TO RESP C

## 2016-05-02 LAB — LACTIC ACID, PLASMA: Lactic Acid, Venous: 1.4 mmol/L (ref 0.5–2.0)

## 2016-05-02 LAB — EXPECTORATED SPUTUM ASSESSMENT W REFEX TO RESP CULTURE

## 2016-05-02 MED ORDER — PIPERACILLIN-TAZOBACTAM 3.375 G IVPB
3.3750 g | Freq: Three times a day (TID) | INTRAVENOUS | Status: DC
  Administered 2016-05-02: 3.375 g via INTRAVENOUS
  Filled 2016-05-02 (×3): qty 50

## 2016-05-02 MED ORDER — HYDROCODONE-ACETAMINOPHEN 7.5-325 MG/15ML PO SOLN: 10.0000 mL | Freq: Four times a day (QID) | ORAL | Status: DC | PRN

## 2016-05-02 MED ORDER — LORAZEPAM 2 MG/ML IJ SOLN: 1.0000 mg | INTRAMUSCULAR | Status: DC | PRN

## 2016-05-02 MED ORDER — LEVOFLOXACIN 750 MG PO TABS: 750.0000 mg | ORAL_TABLET | Freq: Every day | ORAL | Status: DC

## 2016-05-02 MED ORDER — BISACODYL 5 MG PO TBEC: 5.0000 mg | DELAYED_RELEASE_TABLET | Freq: Every day | ORAL | Status: DC | PRN

## 2016-05-02 MED ORDER — HYDROCODONE-ACETAMINOPHEN 5-325 MG PO TABS: 1.0000 | ORAL_TABLET | ORAL | Status: DC | PRN

## 2016-05-02 MED ORDER — HEPARIN SODIUM (PORCINE) 5000 UNIT/ML IJ SOLN
5000.0000 [IU] | Freq: Three times a day (TID) | INTRAMUSCULAR | Status: DC
  Administered 2016-05-02: 5000 [IU] via SUBCUTANEOUS
  Filled 2016-05-02: qty 1

## 2016-05-02 MED ORDER — ACETAMINOPHEN 650 MG RE SUPP: 650.0000 mg | Freq: Four times a day (QID) | RECTAL | Status: DC | PRN

## 2016-05-02 MED ORDER — POLYETHYLENE GLYCOL 3350 17 G PO PACK: 17.0000 g | PACK | Freq: Every day | ORAL | Status: DC | PRN

## 2016-05-02 MED ORDER — SODIUM CHLORIDE 0.9% FLUSH: 3.0000 mL | Freq: Two times a day (BID) | INTRAVENOUS | Status: DC

## 2016-05-02 MED ORDER — ACETAMINOPHEN 325 MG PO TABS: 650.0000 mg | ORAL_TABLET | Freq: Four times a day (QID) | ORAL | Status: DC | PRN

## 2016-05-02 MED ORDER — ALPRAZOLAM 0.5 MG PO TABS
0.5000 mg | ORAL_TABLET | Freq: Three times a day (TID) | ORAL | Status: DC | PRN
Start: 2016-05-02 — End: 2016-05-05

## 2016-05-02 MED ORDER — ONDANSETRON HCL 4 MG/2ML IJ SOLN: 4.0000 mg | Freq: Four times a day (QID) | INTRAMUSCULAR | Status: DC | PRN

## 2016-05-02 MED ORDER — MIRTAZAPINE 15 MG PO TABS
15.0000 mg | ORAL_TABLET | Freq: Every day | ORAL | Status: DC
  Filled 2016-05-02: qty 1

## 2016-05-02 MED ORDER — GUAIFENESIN 100 MG/5ML PO SOLN: 200.0000 mg | Freq: Three times a day (TID) | ORAL | Status: DC | PRN

## 2016-05-02 MED ORDER — ZOLPIDEM TARTRATE 5 MG PO TABS: 5.0000 mg | ORAL_TABLET | Freq: Every evening | ORAL | Status: DC | PRN

## 2016-05-02 MED ORDER — BENZONATATE 100 MG PO CAPS: 100.0000 mg | ORAL_CAPSULE | Freq: Three times a day (TID) | ORAL | Status: DC | PRN

## 2016-05-02 MED ORDER — HYDROMORPHONE HCL 1 MG/ML IJ SOLN
1.0000 mg | INTRAMUSCULAR | Status: DC | PRN
  Administered 2016-05-02 (×2): 1 mg via INTRAVENOUS
  Filled 2016-05-02 (×2): qty 1

## 2016-05-02 MED ORDER — ONDANSETRON HCL 4 MG PO TABS: 4.0000 mg | ORAL_TABLET | Freq: Four times a day (QID) | ORAL | Status: DC | PRN

## 2016-05-02 MED ORDER — IPRATROPIUM-ALBUTEROL 0.5-2.5 (3) MG/3ML IN SOLN: 3.0000 mL | RESPIRATORY_TRACT | Status: DC | PRN

## 2016-05-02 MED ORDER — EMETROL 1.87-1.87-21.5 PO SOLN
10.0000 mL | ORAL | Status: DC | PRN
  Filled 2016-05-02: qty 118

## 2016-05-02 MED ORDER — ADULT MULTIVITAMIN W/MINERALS CH
1.0000 | ORAL_TABLET | Freq: Every day | ORAL | Status: DC
  Filled 2016-05-02: qty 1

## 2016-05-02 MED ORDER — GUAIFENESIN 100 MG/5ML PO LIQD: 200.0000 mg | Freq: Three times a day (TID) | ORAL | Status: DC | PRN

## 2016-05-02 MED FILL — ALPRAZolam 0.5 MG TABS: 0.5 | 5 days supply | Qty: 15 | Fill #0

## 2016-05-02 MED FILL — HYDROCOD-APAP 7.5-325/15ML: 7.5-325 | 3 days supply | Qty: 120 | Fill #0

## 2016-05-02 NOTE — Telephone Encounter (Signed)
Apt cancelled per pof pt in  hospital

## 2016-05-02 NOTE — H&P (Signed)
History and Physical    Joel Osborne RSW:[id number] DOB: 08/17/1966 DOA: 05/01/2016  PCP: [name], MD   Patient coming from: Home   Chief Complaint: Chest pain, dyspnea   HPI: Joel Osborne is a 50 y.o. 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 Patient was diagnosed with esophageal cancer in 2014 and underwent radiation at [facility]. 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 He presented to the hospital on 04/20/2016 with pneumonia and was found to have a hilar mass concerning for metastatic disease and a very large pericardial effusion with tamponade requiring pericardial window. Patient was eventually discharged home on 04/26/2016 in stable and much improved condition. Since returning home, the patient has experienced insidious return of his chest pain and dyspnea with increasing cough productive of thin clear sputum.  ED Course: Upon arrival to the ED, patient is found to be afebrile, saturating adequately with supplemental oxygen via trach, tachycardic in the low 100s, and with soft but stable blood pressure. CT angiogram of the chest was obtained and notable for slight worsening in the right hilar lymphadenopathy or mass which is now causing near-complete obliteration of right middle lobe bronchus, right middle lobe pulmonary artery,and the right lower lobe pulmonary artery. There is a new ground-glass consolidation in the right upper lobe. BMP is unremarkable and CBC notable for hemoglobin of 12.6 with normal MCV. CT surgery was consulted by the EDP and advised that they have no surgical options for the patient.  Pulmonology was also consulted by EDP and, again, advised palliative care. Oncology has kindly offered to see the patient in the morning. 2 L of normal saline were bolused and blood cultures were obtained in the emergency department. Patient was started on empiric treatment with Zosyn given his symptomatology and new right upper lobe opacity. Patient will be admitted to the hospital for ongoing evaluation and management of his chest pain and worsening dyspnea, suspected secondary to interval progression of his metastatic disease.  Review of Systems:  All other systems reviewed and apart from HPI, are negative.  Past Medical History  Diagnosis Date  . Hyperlipemia   . Colon polyp   . Pre-diabetes   . GERD (gastroesophageal reflux disease)   . History of radiation therapy 04/07/13-05-15-13    [place]50.4Gy/34[place]  . Sleep apnea     no c-pap at this time  . Anxiety   . [place]     [place] denies.  . [place]    Adenocarcinoma  . Diverticulosis     Past Surgical History  Procedure Laterality Date  . Appendectomy    . Lipoma excision    . Edg  03/18/2013    Esophageal Mass  . Complete esophagectomy N/A 07/07/2013    Procedure: ESOPHAGECTOMY COMPLETE;  Surgeon: EGrace Isaac MD;  [facility] [facility]  Service: Thoracic;  Laterality: N/A;  transhiatel esophagectomy, feeding jejunostomy  . Video bronchoscopy N/A 07/07/2013    Procedure: VIDEO BRONCHOSCOPY;  Surgeon: EGrace Isaac MD;  [place] [facility]  Service: Thoracic;  Laterality: N/A;  . Eus N/A 06/25/2014    Procedure: UPPER ENDOSCOPIC ULTRASOUND (EUS) LINEAR;  Surgeon: Milus Banister, MD;  [facility];  Service: Endoscopy;  Laterality: N/A;  . Esophagogastroduodenoscopy N/A 07/02/2014    Procedure: ESOPHAGOGASTRODUODENOSCOPY (EGD);  Surgeon: Gatha Mayer, MD;  Location: [facility];  Service: Endoscopy;  Laterality: N/A;    . Vocal chord surgery      pushed right vocal cord more centered- September 2015  . Esophagogastroduodenoscopy N/A 02/16/2015    Procedure: ESOPHAGOGASTRODUODENOSCOPY (EGD);  Surgeon: Lafayette Dragon, MD;  Location: [facility];  Service: Endoscopy;  Laterality: N/A;  . Upper gastrointestinal endoscopy    . Colonoscopy    . Esophagogastroduodenoscopy N/A 09/29/2015    Procedure: ESOPHAGOGASTRODUODENOSCOPY (EGD);  Surgeon: Ladene Artist, MD;  Location: [facility];  Service: Endoscopy;  Laterality: N/A;  . Subxyphoid pericardial window N/A 04/21/2016    Procedure: SUBXYPHOID PERICARDIAL WINDOW;  Surgeon: Melrose Nakayama, MD;  Location: [facility];  Service: Thoracic;  Laterality: N/A;     reports that he has never smoked. He has never used smokeless tobacco. He reports that he drinks about 1.2 oz of alcohol per week. He reports that he does not use illicit drugs.  Allergies  Allergen Reactions  . Lactose Intolerance (Gi) Other (See Comments)    Upset stomach     Family History  Problem Relation Age of Onset  . Colon cancer Mother     dx in her 8s  . Diabetes Mother   . Breast cancer Maternal Aunt 81  . Diabetes Maternal Aunt   . Esophageal cancer Maternal Aunt 78  . Kidney failure Brother 20  . Stomach cancer Neg Hx   . Rectal cancer Neg Hx   . Hypertension Brother 42     Prior to Admission medications   Medication Sig Start Date End Date Taking? Authorizing Provider  acetaminophen (TYLENOL) 500 MG tablet Take 1,000 mg by mouth every 6 (six) hours as needed for mild pain or moderate pain.   Yes Historical Provider, MD  anti-nausea (EMETROL) solution Take 10 mLs by mouth every 15 (fifteen) minutes as needed for nausea or vomiting.   Yes Historical Provider, MD  benzonatate (TESSALON) 100 MG capsule Take 1 capsule (100 mg total) by mouth 3 (three) times daily as needed for cough. 04/26/16  Yes Maryann Mikhail, DO  cefdinir (OMNICEF) 250 MG/5ML suspension Take 6 mLs by mouth 2  (two) times daily. For 7 days. Started medication 04/26/16 04/26/16  Yes Historical Provider, MD  eszopiclone (LUNESTA) 1 MG TABS tablet TAKE 1-2 TABLETS BY MOUTH AT BEDTIME AS NEEDED FOR SLEEP 02/22/16  Yes Ladell Pier, MD  ibuprofen (ADVIL,MOTRIN) 200 MG tablet Take 200 mg by mouth every 8 (eight) hours as needed.   Yes Historical Provider, MD  lactase (LACTAID) 3000 UNITS tablet Take 3,000 Units by mouth daily as needed (lactose intolerance). For lactose intolerance   Yes Historical Provider, MD  lidocaine-prilocaine (EMLA) cream Apply 1 application topically as needed. Apply tablespoon to PAC 2 hours prior to stick and cover with plastic wrap 11/09/15  Yes Ladell Pier, MD  mirtazapine (REMERON) 15 MG tablet TAKE ONE TABLET BY MOUTH AT BEDTIME 02/16/16  Yes Ladell Pier, MD  Multiple Vitamin (MULTIVITAMIN WITH MINERALS) TABS tablet Take 1 tablet by mouth daily.   Yes Historical Provider, MD  brompheniramine-pseudoephedrine (DIMETAPP) 1-15 MG/5ML ELIX Take by mouth 2 (two) times daily as needed for allergies.    Historical Provider, MD  dextromethorphan-guaiFENesin (MUCINEX DM) 30-600 MG 12hr tablet Take 1 tablet by mouth 2 (two) times  daily. Reported on 04/20/2016    Historical Provider, MD  guaiFENesin (ROBITUSSIN) 100 MG/5ML liquid Take 200 mg by mouth 3 (three) times daily as needed for cough.    Historical Provider, MD    Physical Exam: Filed Vitals:   05/01/16 2030 05/01/16 2100 05/01/16 2158 05/01/16 2200  BP: '99/70 82/60 97/59 '$ 99/62  Pulse: 105 102 99 99  Temp:      TempSrc:      Resp: 22 24 35 32  SpO2: 94% 94% 93% 95%      Constitutional: NAD, calm, comfortable Eyes: PERTLA, lids and conjunctivae normal ENMT: Mucous membranes are moist. Tracheostomy site c/d/i.   Neck: normal, supple, no thyromegaly Respiratory: Coarse rhonchi throughout right lung fields, no wheezing. Normal respiratory effort. No accessory muscle use.  Cardiovascular: S1 & S2 heard, regular rate and  rhythm, no significant murmurs / rubs / gallops. No extremity edema. No significant JVD. Abdomen: No distension, no tenderness, no masses palpated. Bowel sounds normal.  Musculoskeletal: no cyanosis. No joint deformity upper and lower extremities. Normal muscle tone.  Skin: no significant rashes, lesions, ulcers. Warm, dry, well-perfused. Neurologic: CN 2-12 grossly intact. Sensation intact, DTR normal. Strength 5/5 in all 4 limbs.  Psychiatric: Normal judgment and insight. Alert and oriented x 3. Normal mood and affect.     Labs on Admission: I have personally reviewed following labs and imaging studies  CBC:  Recent Labs Lab 04/25/16 0438 04/26/16 0400 05/01/16 1406  WBC 9.8 9.8 10.0  HGB 11.2* 10.6* 12.6*  HCT 35.2* 33.1* 40.5  MCV 87.6 87.6 86.7  PLT 178 171 169   Basic Metabolic Panel:  Recent Labs Lab 04/25/16 0438 04/26/16 0400 05/01/16 1406  NA 140 141 137  K 4.4 3.9 4.0  CL 106 106 104  CO2 '26 27 25  '$ GLUCOSE 133* 102* 111*  BUN 5* 7 7  CREATININE 0.52* 0.55* 0.73  CALCIUM 8.3* 8.1* 8.9   GFR: Estimated Creatinine Clearance: 119.9 mL/min (by C-G formula based on Cr of 0.73). Liver Function Tests:  Recent Labs Lab 04/25/16 0438 04/26/16 0400  AST 81* 61*  ALT 365* 285*  ALKPHOS 95 93  BILITOT 0.7 0.6  PROT 5.0* 4.9*  ALBUMIN 2.0* 2.1*   No results for input(s): LIPASE, AMYLASE in the last 168 hours. No results for input(s): AMMONIA in the last 168 hours. Coagulation Profile: No results for input(s): INR, PROTIME in the last 168 hours. Cardiac Enzymes: No results for input(s): CKTOTAL, CKMB, CKMBINDEX, TROPONINI in the last 168 hours. BNP (last 3 results) No results for input(s): PROBNP in the last 8760 hours. HbA1C: No results for input(s): HGBA1C in the last 72 hours. CBG: No results for input(s): GLUCAP in the last 168 hours. Lipid Profile: No results for input(s): CHOL, HDL, LDLCALC, TRIG, CHOLHDL, LDLDIRECT in the last 72 hours. Thyroid  Function Tests: No results for input(s): TSH, T4TOTAL, FREET4, T3FREE, THYROIDAB in the last 72 hours. Anemia Panel: No results for input(s): VITAMINB12, FOLATE, FERRITIN, TIBC, IRON, RETICCTPCT in the last 72 hours. Urine analysis:    Component Value Date/Time   COLORURINE YELLOW 05/01/2016 2316   APPEARANCEUR CLOUDY* 05/01/2016 2316   LABSPEC 1.028 05/01/2016 2316   PHURINE 8.0 05/01/2016 2316   GLUCOSEU NEGATIVE 05/01/2016 2316   HGBUR NEGATIVE 05/01/2016 2316   BILIRUBINUR NEGATIVE 05/01/2016 2316   KETONESUR 15* 05/01/2016 2316   PROTEINUR NEGATIVE 05/01/2016 2316   UROBILINOGEN 0.2 07/03/2013 1000   NITRITE NEGATIVE 05/01/2016 2316   LEUKOCYTESUR NEGATIVE 05/01/2016  2316   Sepsis Labs: '@LABRCNTIP'$ (procalcitonin:4,lacticidven:4) )No results found for this or any previous visit (from the past 240 hour(s)).   Radiological Exams on Admission: Dg Chest 2 View  05/01/2016  CLINICAL DATA:  Chest Pain Sob Cough Worse Today Surgery 1 Week Ago For effusions EXAM: CHEST  2 VIEW COMPARISON:  04/24/2016 FINDINGS: Right-sided Port-A-Cath terminates at the low SVC versus cavoatrial junction. Tracheostomy. Midline trachea. Normal heart size. Small bilateral pleural effusions are decreased. No pneumothorax. No congestive failure. Improved bibasilar and right infrahilar airspace disease. IMPRESSION: Improved aeration with decreased tiny pleural effusions and lower lobe predominant airspace disease, likely atelectasis. Electronically Signed   By: Abigail Miyamoto M.D.   On: 05/01/2016 14:17   Ct Angio Chest Pe W/cm &/or Wo Cm  05/01/2016  CLINICAL DATA:  Shortness of breath and mid chest pain radiating to the right side. History of esophageal cancer in 2014. EXAM: CT ANGIOGRAPHY CHEST WITH CONTRAST TECHNIQUE: Multidetector CT imaging of the chest was performed using the standard protocol during bolus administration of intravenous contrast. Multiplanar CT image reconstructions and MIPs were obtained to  evaluate the vascular anatomy. CONTRAST:  100 cc Isovue 370. COMPARISON:  Chest radiograph 05/01/2016, CT of the chest 04/20/2016 FINDINGS: Mediastinum/Lymph Nodes: No pulmonary emboli or thoracic aortic dissection identified. There has been interval decrease in the previously identified pericardial effusion with maximum thickness now measuring 8 mm. The heart is otherwise normal in size. There are stable postsurgical changes from esophagectomy at gastric pull-through. Tracheostomy tube is in place. Right internal jugular approach injectable port terminates in the superior vena cava. There are stable to slightly worsened hilar lymphadenopathy versus metastatic masses with a nodal mass in the right hilum compressing the takeoff of the right lower lobe pulmonary artery to a half of its normal caliber. The right middle lobe pulmonary artery is compressed to a sliver. There is an associated near complete obliteration of the right middle lobe bronchus, and mild narrowing of the right upper and lower lobe bronchi. There is a 10 mm precarinal mediastinal lymph node, stable. Lungs/Pleura: There are bilateral pleural effusions, not significantly changed in size. A near complete collapse of the right middle lobe is stable. Nodular metastases in the left lower lobe is stable. There is slight increase in the nodular metastases in the lingula now measuring 15 mm. There is a new ground-glass opacity in the posterior perifissural aspect of the right upper lobe, image 58 lung windows. Upper abdomen: No acute findings. Musculoskeletal: No chest wall mass or suspicious bone lesions identified. Review of the MIP images confirms the above findings. IMPRESSION: Slight worsening of right hilar lymphadenopathy versus metastatic disease causing near complete obliteration of the right middle lobe bronchus with near complete collapse of the right middle lobe, near complete obliteration of the right middle lobe pulmonary artery, and narrowing  of the right lower lobe pulmonary artery. Interval decrease in the size of the previously identified pericardial effusion. Stable bilateral pleural effusions. Interval development of apparently acute ground-glass consolidation in the right upper lobe. Slight increase in the nodular metastatic disease in the lingula. Stable left lower lobe pulmonary metastatic disease. No evidence of pulmonary embolus or thoracic dissection. Electronically Signed   By: Fidela Salisbury M.D.   On: 05/01/2016 18:53    EKG: Independently reviewed. Sinus tachycardia (rate 115), anterolateral T-wave inversion  Assessment/Plan  1. ?HCAP  - New RUL consolidation possibly represents a PNA  - Sputum culture requested, blood cultures incubating  - Empiric Zosyn initiated in  ED, will continue   - Trend PCT, follow-up cultures  - Titrate FiO2 to maintain sats >94%    2. Metastatic cancer with obstructing hilar mass  - Diagnosed with adenocarcinoma of distal 1/3 esophagus/gastric cardia in 2014 and treated with radiation at [place]  - Received FOLFOX in 2016  - Progression of metastatic disease noted on admission CT relative to that performed on 04/20/16 - Oncology has been consulted by EDP and much appreciated   3. Chest pain  - Suspected secondary to the metastatic process rather than ACS  - Initial troponin 0.01, will obtain serial measurements x3  - EKG has TWIs in anterolateral leads, will repeat in am (sooner for CP recurrence)  - Pain-control prn   4. Dyspnea  - Suspected secondary to the malignant process; ?HCAP may be contributing  - Treat potential infection with empiric Zosyn while awaiting culture data  - Titrate FiO2 to maintain sats in mid-high 90s  - Neb treatments prn    5. Atrial fibrillation, paroxysmal  - In sinus rhythm on admission  - CHADS-VASc is 0, not on [facility]   - Monitoring on telemetry   6. Anemia  - Secondary to chronic disease  - Hgb is 12.6 on admission, higher than recent priors,  likely secondary to hemoconcentration  - No sign of active blood-loss   7. Insomnia, depression  - Stable  - Continue Remeron, zolpidem    DVT prophylaxis: sq heparin  Code Status: Full   Family Communication: Wife, son, daughter at bedside   Disposition Plan: Admit to telemetry   Consults called: EDP consulted PCCM, CTS, oncology  Admission status: Inpatient    Vianne Bulls, MD [facility] Pager [phone]  If 7PM-7AM, please contact night-coverage [email or web address] Password [facility]  05/02/2016, 12:50 AM

## 2016-05-02 NOTE — Progress Notes (Signed)
IP PROGRESS NOTE  Subjective:  He was admitted yesterday with chest pain and dyspnea. He reports feeling significantly better today. Mild chest discomfort.   Objective: Vital signs in last 24 hours: Blood pressure 107/73, pulse 98, temperature 99.3 F (37.4 C), temperature source Oral, resp. rate 18, height 6' (1.829 m), weight 187 lb 6.4 oz (85.004 kg), SpO2 98 %.  Intake/Output from previous day: 05/22 0701 - 05/23 0700 In: -  Out: 900 [Urine:900]  Physical Exam:  HEENT: Tracheostomy in place, neck without mass Lungs: Scattered upper airway sounds, good air movement bilaterally, no respiratory distress Cardiac: Regular rate and rhythm Abdomen: No hepatomegaly, nontender, healed subxiphoid incision Extremities: No leg edema    Portacath/PICC-without erythema  Lab Results:  Recent Labs  05/01/16 1406  WBC 10.0  HGB 12.6*  HCT 40.5  PLT 366    BMET  Recent Labs  05/01/16 1406  NA 137  K 4.0  CL 104  CO2 25  GLUCOSE 111*  BUN 7  CREATININE 0.73  CALCIUM 8.9   AST 103, ALT 512, bilirubin 0.9 Studies/Results: Dg Chest 2 View  05/01/2016  CLINICAL DATA:  Chest Pain Sob Cough Worse Today Surgery 1 Week Ago For effusions EXAM: CHEST  2 VIEW COMPARISON:  04/24/2016 FINDINGS: Right-sided Port-A-Cath terminates at the low SVC versus cavoatrial junction. Tracheostomy. Midline trachea. Normal heart size. Small bilateral pleural effusions are decreased. No pneumothorax. No congestive failure. Improved bibasilar and right infrahilar airspace disease. IMPRESSION: Improved aeration with decreased tiny pleural effusions and lower lobe predominant airspace disease, likely atelectasis. Electronically Signed   By: Abigail Miyamoto M.D.   On: 05/01/2016 14:17   Ct Angio Chest Pe W/cm &/or Wo Cm  05/01/2016  CLINICAL DATA:  Shortness of breath and mid chest pain radiating to the right side. History of esophageal cancer in 2014. EXAM: CT ANGIOGRAPHY CHEST WITH CONTRAST TECHNIQUE:  Multidetector CT imaging of the chest was performed using the standard protocol during bolus administration of intravenous contrast. Multiplanar CT image reconstructions and MIPs were obtained to evaluate the vascular anatomy. CONTRAST:  100 cc Isovue 370. COMPARISON:  Chest radiograph 05/01/2016, CT of the chest 04/20/2016 FINDINGS: Mediastinum/Lymph Nodes: No pulmonary emboli or thoracic aortic dissection identified. There has been interval decrease in the previously identified pericardial effusion with maximum thickness now measuring 8 mm. The heart is otherwise normal in size. There are stable postsurgical changes from esophagectomy at gastric pull-through. Tracheostomy tube is in place. Right internal jugular approach injectable port terminates in the superior vena cava. There are stable to slightly worsened hilar lymphadenopathy versus metastatic masses with a nodal mass in the right hilum compressing the takeoff of the right lower lobe pulmonary artery to a half of its normal caliber. The right middle lobe pulmonary artery is compressed to a sliver. There is an associated near complete obliteration of the right middle lobe bronchus, and mild narrowing of the right upper and lower lobe bronchi. There is a 10 mm precarinal mediastinal lymph node, stable. Lungs/Pleura: There are bilateral pleural effusions, not significantly changed in size. A near complete collapse of the right middle lobe is stable. Nodular metastases in the left lower lobe is stable. There is slight increase in the nodular metastases in the lingula now measuring 15 mm. There is a new ground-glass opacity in the posterior perifissural aspect of the right upper lobe, image 58 lung windows. Upper abdomen: No acute findings. Musculoskeletal: No chest wall mass or suspicious bone lesions identified. Review of the MIP  images confirms the above findings. IMPRESSION: Slight worsening of right hilar lymphadenopathy versus metastatic disease causing  near complete obliteration of the right middle lobe bronchus with near complete collapse of the right middle lobe, near complete obliteration of the right middle lobe pulmonary artery, and narrowing of the right lower lobe pulmonary artery. Interval decrease in the size of the previously identified pericardial effusion. Stable bilateral pleural effusions. Interval development of apparently acute ground-glass consolidation in the right upper lobe. Slight increase in the nodular metastatic disease in the lingula. Stable left lower lobe pulmonary metastatic disease. No evidence of pulmonary embolus or thoracic dissection. Electronically Signed   By: Fidela Salisbury M.D.   On: 05/01/2016 18:53   CT images reviewed Medications: I have reviewed the patient's current medications.  Assessment/Plan:  1. 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  Metastatic lung nodules confirmed on CT in December 2016  Most recently treated with pembrolizumab, last cycle given 04/07/2016  CT 04/20/2016 consistent with progressive disease involving a large pericardial effusion, bilateral pleural effusions, enlarged mediastinal lymph nodes and lung nodules  Cytology from the pericardial fluid 04/21/2016-positive for metastatic adenocarcinoma  CT 05/01/2016 was slight worsening of right hilar lymphadenopathy with near complete collapse of the right middle lobe, nodular metastases in the left lung, stable bilateral pleural effusions  2. Admission 04/20/2016 with Respiratory failure secondary to a large pericardial effusion, right middle lobe collapse, and bilateral pleural effusions   Status post a pericardial window procedure 04/21/2016  3.  History of bilateral vocal cord paralysis, status post a tracheostomy and bilateral injection laryngoplasty at [facility] on 10/27/2015  4. Chest discomfort-potentially related to recent surgery,  musculoskeletal pain from coughing, or the metastatic tumor burden   He appears comfortable this morning. I suspect his presentation yesterday was related to benign musculoskeletal pain and anxiety. He does have a significant tumor burden in the chest with obstruction of the right middle lobe and bilateral effusions which may account for his dyspnea.  I discussed treatment options with him last week and again this morning. We discussed comfort care/hospice versus a trial of salvage therapy. Dr.Tselildes indicated again today that he wishes to proceed with salvage therapy. I communicated with Dr. Fanny Skates at [facility] and she agrees with Taxol/ ramucirumab.  He is interested in considering palliative radiation. I see no role for radiation unless the radiation oncologist feel radiation could improve the right middle lobe collapse.   We discussed CPR and ACLS issues. He would like to discuss CODE STATUS with his wife.  I discussed the situation with his wife by telephone this morning.  Recommendations:  1. continue pulmonary toilet  2. anxiolytics/narcotics as needed 3. I will discuss the case with radiation oncology 4. Okay for discharge from an oncology standpoint. We will schedule outpatient follow-up  LOS: 0 days   Betsy Coder, MD   05/02/2016, 8:36 AM

## 2016-05-02 NOTE — Progress Notes (Signed)
Per RN, pt is refusing any further labs.

## 2016-05-02 NOTE — Progress Notes (Signed)
Removed IV. No issues present. Reviewed discharge instructions with family and patient. Awaiting wheelchair for transport.  Waverly Chavarria, Mervin Kung RN

## 2016-05-02 NOTE — Progress Notes (Signed)
Utilization review completed- post discharge 

## 2016-05-02 NOTE — Discharge Summary (Signed)
Physician Discharge Summary  Joel Osborne JOA:[id number] DOB: [birth year] DOA: 05/01/2016  PCP: Joel Lopes, MD  Admit date: 05/01/2016 Discharge date: 05/02/2016  Time spent: 35* minutes  Recommendations for Outpatient Follow-up:  1. Follow up PCP in 2 weeks 2. 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 Patient was diagnosed with esophageal cancer in 2014 and underwent radiation at [facility]. 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 He presented to the hospital on 04/20/2016 with pneumonia and was found to have a hilar mass concerning for metastatic disease and a very large pericardial effusion with tamponade requiring pericardial window. Patient was eventually discharged home on 04/26/2016 in stable and much improved condition. Since returning home, the patient has experienced insidious return of his chest pain and dyspnea with increasing cough productive of thin clear sputum.  Hospital Course:  Patient was admitted for possible healthcare associated pneumonia, there was RUL consolidation seen on CT scan. Patient feels fine this morning. Has been seen by  oncology as well as CT surgery. Oncology has cleared the patient for discharge. He has been afebrile, normal white count. Lactic acid 1.4. Will discharge the patient home on Levaquin 750 mg by mouth daily for 5 more days. Vicodin when necessary for pain, Xanax 0.5 mg 3 times a day when necessary for anxiety. Patient's dyspnea is likely from anxiety as per patient's oncologist  Patient will be given Xanax when necessary for anxiety. He will follow-up with oncology as outpatient.   Procedures:  None)  Consultations:  Oncology  CT surgery  Discharge Exam: Filed Vitals:   05/02/16 0747 05/02/16 1148  BP:    Pulse: 98 104  Temp:    Resp: 18 16    General: Appears in no acute distress Cardiovascular: S1-S2 regular Respiratory: Clear to auscultation bilaterally  Discharge Instructions   Discharge Instructions    Diet - low sodium heart healthy    Complete by:  As directed      Increase activity slowly    Complete by:  As directed           Current Discharge Medication List    START taking these medications   Details  ALPRAZolam (XANAX) 0.5 MG tablet Take 1 tablet (0.5 mg total) by mouth 3 (three) times daily as needed for anxiety. Qty: 15 tablet, Refills: 0    HYDROcodone-acetaminophen (NORCO/VICODIN) 5-325 MG tablet Take 1-2 tablets by mouth every 4 (four) hours as needed for moderate pain. Qty: 30 tablet, Refills: 0    levofloxacin (LEVAQUIN) 750 MG tablet Take 1 tablet (750 mg total) by mouth daily. Qty: 5 tablet, Refills: 0      CONTINUE these medications which have CHANGED  Details  guaiFENesin (ROBITUSSIN) 100 MG/5ML liquid Take 10 mLs (200 mg total) by mouth 3 (three) times daily as needed for cough. Qty: 120 mL, Refills: 2      CONTINUE these medications which have NOT CHANGED   Details  acetaminophen (TYLENOL) 500 MG tablet Take 1,000 mg by mouth every 6 (six) hours as needed for mild pain or moderate pain.    anti-nausea (EMETROL) solution Take 10  mLs by mouth every 15 (fifteen) minutes as needed for nausea or vomiting.    benzonatate (TESSALON) 100 MG capsule Take 1 capsule (100 mg total) by mouth 3 (three) times daily as needed for cough. Qty: 20 capsule, Refills: 0    cefdinir (OMNICEF) 250 MG/5ML suspension Take 6 mLs by mouth 2 (two) times daily. For 7 days. Started medication 04/26/16 Refills: 0    eszopiclone (LUNESTA) 1 MG TABS tablet TAKE 1-2 TABLETS BY MOUTH AT BEDTIME AS NEEDED FOR SLEEP Qty: 30 tablet, Refills: 0    ibuprofen (ADVIL,MOTRIN) 200 MG tablet Take 200 mg by mouth every 8 (eight) hours as needed.    lactase (LACTAID) 3000 UNITS tablet Take 3,000 Units by mouth daily as needed (lactose intolerance). For lactose intolerance    lidocaine-prilocaine (EMLA) cream Apply 1 application topically as needed. Apply tablespoon to PAC 2 hours prior to stick and cover with plastic wrap Qty: 30 g, Refills: 5    mirtazapine (REMERON) 15 MG tablet TAKE ONE TABLET BY MOUTH AT BEDTIME Qty: 30 tablet, Refills: 1    Multiple Vitamin (MULTIVITAMIN WITH MINERALS) TABS tablet Take 1 tablet by mouth daily.    brompheniramine-pseudoephedrine (DIMETAPP) 1-15 MG/5ML ELIX Take by mouth 2 (two) times daily as needed for allergies.   Associated Diagnoses: Cancer of distal third of esophagus ([place])      STOP taking these medications     dextromethorphan-guaiFENesin (MUCINEX DM) 30-600 MG 12hr tablet        Allergies  Allergen Reactions  . Lactose Intolerance (Gi) Other (See Comments)    Upset stomach       The results of significant diagnostics from this hospitalization (including imaging, microbiology, ancillary and laboratory) are listed below for reference.    Significant Diagnostic Studies: Ct Abdomen Pelvis Wo Contrast  04/20/2016  CLINICAL DATA:  History of esophageal carcinoma cough productive fevers chills, elevated liver enzymes EXAM: CT ABDOMEN AND PELVIS WITHOUT CONTRAST TECHNIQUE: Multidetector CT imaging of the  abdomen and pelvis was performed following the standard protocol without IV contrast. COMPARISON:  04/20/2016 CT thorax FINDINGS: Small left and moderate right pleural effusion. Large pericardial effusion partially visualized. Gastric pull-up noted consistent with history of esophageal carcinoma. Partially visualized right lower lobe consolidation. Liver appears normal, as does the pancreas and as does spleen given limited evaluation without contrast. There are several small gallstones. No adrenal masses. No significant renal abnormalities. 1 cm cyst upper pole left kidney and 4 mm cyst lower pole right kidney. No hydronephrosis. IV contrast concentrated within the dependent portion of the bladder. Nonobstructive bowel gas pattern.  Small and large bowel normal. Reproductive organs normal. No significant free fluid. No significant vascular abnormalities. No significant adenopathy. No acute musculoskeletal findings. IMPRESSION: Partially visualized large pericardial effusion. Right lower lobe consolidation. Bilateral pleural effusions. Cholelithiasis. Electronically Signed   By: Skipper Cliche M.D.   On: 04/20/2016 19:49   Dg Chest 2 View  05/01/2016  CLINICAL DATA:  Chest Pain Sob Cough Worse Today Surgery 1 Week Ago For effusions EXAM: CHEST  2 VIEW  COMPARISON:  04/24/2016 FINDINGS: Right-sided Port-A-Cath terminates at the low SVC versus cavoatrial junction. Tracheostomy. Midline trachea. Normal heart size. Small bilateral pleural effusions are decreased. No pneumothorax. No congestive failure. Improved bibasilar and right infrahilar airspace disease. IMPRESSION: Improved aeration with decreased tiny pleural effusions and lower lobe predominant airspace disease, likely atelectasis. Electronically Signed   By: Abigail Miyamoto M.D.   On: 05/01/2016 14:17   Dg Chest 2 View  04/24/2016  CLINICAL DATA:  Shortness of breath, weakness, esophageal malignancy, acute respiratory failure, tracheostomy patient. EXAM:  CHEST  2 VIEW COMPARISON:  Portable chest x-ray of Apr 22, 2016 FINDINGS: The lungs are hypoinflated. There is bibasilar atelectasis. There small bilateral pleural effusions. The cardiac silhouette remains enlarged. The pulmonary vascularity is less engorged. The tracheostomy appliance tip projects between the clavicular heads. The power port appliance tip projects over the distal third of the SVC. IMPRESSION: Decreased pulmonary interstitial edema today. Persistent bibasilar atelectasis with small bilateral pleural effusions. No pneumothorax. The support tubes are in stable position. Electronically Signed   By: David  Martinique M.D.   On: 04/24/2016 07:30   Dg Chest 2 View  04/18/2016  CLINICAL DATA:  Esophageal cancer. Productive cough since Saturday. Shortness of breath. EXAM: CHEST  2 VIEW COMPARISON:  CT 02/07/2016 FINDINGS: Airspace opacity noted in both lower lobes concerning for pneumonia. Heart is mildly enlarged. Tracheostomy tube and right Port-A-Cath in place. No visible effusions. No acute bony abnormality. IMPRESSION: Bilateral lower lobe airspace opacities may reflect pneumonia. Mild cardiomegaly. Electronically Signed   By: Rolm Baptise M.D.   On: 04/18/2016 13:07   Ct Chest W Contrast  04/20/2016  CLINICAL DATA:  Healthcare associated pneumonia. EXAM: CT CHEST WITH CONTRAST TECHNIQUE: Multidetector CT imaging of the chest was performed during intravenous contrast administration. CONTRAST:  20m ISOVUE-300 IOPAMIDOL (ISOVUE-300) INJECTION 61% COMPARISON:  02/07/2016 FINDINGS: THORACIC INLET/BODY WALL: Porta catheter on the right with tip in good position at the upper cavoatrial junction. There is tracheostomy tube which is well seated. No thoracic inlet adenopathy. MEDIASTINUM: Normal heart size. New and massive pericardial effusion measuring up to 4 cm in thickness. There is smooth mild serosal enhancement. Pleural fluid has developed fairly rapidly, new since 02/07/2016. Changes of gastric  pull-through. There is ill-defined adenopathy in the right hilum with right middle lobe bronchus obstruction and narrowing of right lower lobe pulmonary artery. This has a malignant appearance. Hilar adenopathy on the left is milder. Isolated index precarinal node has heterogeneous low-density appearance. This node measures 10 mm short axis compared 8 mm previously. LUNG WINDOWS: Septal thickening on the right which is likely from lymphatic obstruction. This appears fairly smooth there is no definitive parenchymal lymphangitic tumor. Nearly collapsed right middle lobe. Lobulated pulmonary metastases have enlarged, left lower lobe 5:81 measuring up to 23 mm compared to 19 mm previously. A lingular lobulated metastasis measures 14 mm compared to 11 previously. Small layering pleural effusions. UPPER ABDOMEN: Cholelithiasis. There is mild haziness of fat around the gallbladder but no over distention or definitive cholecystitis. Left diaphragmatic hernia containing colon and pancreatic tail. OSSEOUS: No acute fracture.  No suspicious lytic or blastic lesions. These results were called by telephone at the time of interpretation on 04/20/2016 at 7:37 pm to Dr. KBaltazar Najjarwho verbally acknowledged these results. IMPRESSION: 1. Massive low-density pericardial effusion that has developed since 02/07/2016. No pericardial mass noted. 2. 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 3. Right middle lobe airway obstruction and collapse due to right hilar  adenopathy. Right lung edema attributed to malignant lymphatic obstruction. Electronically Signed   By: Monte Fantasia M.D.   On: 04/20/2016 19:38   Ct Angio Chest Pe W/cm &/or Wo Cm  05/01/2016  CLINICAL DATA:  Shortness of breath and mid chest pain radiating to the right side. History of esophageal cancer in 2014. EXAM: CT ANGIOGRAPHY CHEST WITH CONTRAST TECHNIQUE: Multidetector CT imaging of the chest was performed using the standard protocol  during bolus administration of intravenous contrast. Multiplanar CT image reconstructions and MIPs were obtained to evaluate the vascular anatomy. CONTRAST:  100 cc Isovue 370. COMPARISON:  Chest radiograph 05/01/2016, CT of the chest 04/20/2016 FINDINGS: Mediastinum/Lymph Nodes: No pulmonary emboli or thoracic aortic dissection identified. There has been interval decrease in the previously identified pericardial effusion with maximum thickness now measuring 8 mm. The heart is otherwise normal in size. There are stable postsurgical changes from esophagectomy at gastric pull-through. Tracheostomy tube is in place. Right internal jugular approach injectable port terminates in the superior vena cava. There are stable to slightly worsened hilar lymphadenopathy versus metastatic masses with a nodal mass in the right hilum compressing the takeoff of the right lower lobe pulmonary artery to a half of its normal caliber. The right middle lobe pulmonary artery is compressed to a sliver. There is an associated near complete obliteration of the right middle lobe bronchus, and mild narrowing of the right upper and lower lobe bronchi. There is a 10 mm precarinal mediastinal lymph node, stable. Lungs/Pleura: There are bilateral pleural effusions, not significantly changed in size. A near complete collapse of the right middle lobe is stable. Nodular metastases in the left lower lobe is stable. There is slight increase in the nodular metastases in the lingula now measuring 15 mm. There is a new ground-glass opacity in the posterior perifissural aspect of the right upper lobe, image 58 lung windows. Upper abdomen: No acute findings. Musculoskeletal: No chest wall mass or suspicious bone lesions identified. Review of the MIP images confirms the above findings. IMPRESSION: Slight worsening of right hilar lymphadenopathy versus metastatic disease causing near complete obliteration of the right middle lobe bronchus with near complete  collapse of the right middle lobe, near complete obliteration of the right middle lobe pulmonary artery, and narrowing of the right lower lobe pulmonary artery. Interval decrease in the size of the previously identified pericardial effusion. Stable bilateral pleural effusions. Interval development of apparently acute ground-glass consolidation in the right upper lobe. Slight increase in the nodular metastatic disease in the lingula. Stable left lower lobe pulmonary metastatic disease. No evidence of pulmonary embolus or thoracic dissection. Electronically Signed   By: Fidela Salisbury M.D.   On: 05/01/2016 18:53   Dg Chest Port 1 View  04/22/2016  CLINICAL DATA:  Evaluate effusion. EXAM: PORTABLE CHEST 1 VIEW COMPARISON:  Apr 18, 2016, CT scan Apr 20, 2016, and chest x-ray Apr 21, 2016 FINDINGS: A tracheostomy tube and right Port-A-Cath are stable. No pneumothorax. There is rounded opacity projected over the right hilum, mildly increased in the interval. This was not seen on Apr 18, 2016 and there was atelectasis in this region on the recent CT scan. Adenopathy was also seen in the right hilum on the recent CT scan. Layering bilateral effusions are again seen. Underlying compressive atelectasis. No change in the cardiomediastinal silhouette. Suggested mild edema. IMPRESSION: 1. Increasing rounded opacity projected over the right infrahilar region correlates with adenopathy and atelectasis on a recent CT scan. 2. Mildly worsened layering effusions, right  greater than left. 3. Mild edema. Electronically Signed   By: Dorise Bullion III M.D   On: 04/22/2016 07:24   Dg Chest Port 1 View  04/21/2016  CLINICAL DATA:  Postop chest, pericardial effusion EXAM: PORTABLE CHEST 1 VIEW COMPARISON:  CT chest dated 04/20/2016 FINDINGS: Patient is rotated. Tracheostomy in satisfactory position. The heart is top-normal in size. Mild perihilar edema. Small left pleural effusion. Mild right infrahilar opacity, likely  atelectasis. Right chest port terminates the cavoatrial junction. IMPRESSION: Mild perihilar edema with small left pleural effusion. Electronically Signed   By: Julian Hy M.D.   On: 04/21/2016 09:43   Dg Knee Complete 4 Views Left  04/24/2016  CLINICAL DATA:  Anterior left knee pain when bearing weight. No known injuries. EXAM: LEFT KNEE - COMPLETE 4+ VIEW COMPARISON:  None. FINDINGS: Irregular periosteal reaction involving the anterior, lateral and medial (predominantly anterior) aspect of the distal femoral metaphysis. No evidence of acute or subacute fracture or dislocation. Joint spaces well preserved. Bone mineral density well-preserved. No visible joint effusion. IMPRESSION: Periostitis involving the distal femur. Differential diagnosis might include osteomyelitis, neoplasm, and hypertrophic pulmonary osteoarthropathy. Electronically Signed   By: Evangeline Dakin M.D.   On: 04/24/2016 11:11    Microbiology: Recent Results (from the past 240 hour(s))  Culture, expectorated sputum-assessment     Status: None   Collection Time: 05/02/16  9:13 AM  Result Value Ref Range Status   Specimen Description SPUTUM  Final   Special Requests NONE  Final   Sputum evaluation   Final    MICROSCOPIC FINDINGS SUGGEST THAT THIS SPECIMEN IS NOT REPRESENTATIVE OF LOWER RESPIRATORY SECRETIONS. PLEASE RECOLLECT. RESULT CALLED TO, READ BACK BY AND VERIFIED WITH: B CORAHER 05/02/16 @ M VESTAL    Report Status 05/02/2016 FINAL  Final     Labs: Basic Metabolic Panel:  Recent Labs Lab 04/26/16 0400 05/01/16 1406  NA 141 137  K 3.9 4.0  CL 106 104  CO2 27 25  GLUCOSE 102* 111*  BUN 7 7  CREATININE 0.55* 0.73  CALCIUM 8.1* 8.9   Liver Function Tests:  Recent Labs Lab 04/26/16 0400  AST 61*  ALT 285*  ALKPHOS 93  BILITOT 0.6  PROT 4.9*  ALBUMIN 2.1*   No results for input(s): LIPASE, AMYLASE in the last 168 hours. No results for input(s): AMMONIA in the last 168 hours. CBC:  Recent  Labs Lab 04/26/16 0400 05/01/16 1406  WBC 9.8 10.0  HGB 10.6* 12.6*  HCT 33.1* 40.5  MCV 87.6 86.7  PLT 171 366   Cardiac Enzymes: No results for input(s): CKTOTAL, CKMB, CKMBINDEX, TROPONINI in the last 168 hours. BNP: BNP (last 3 results) No results for input(s): BNP in the last 8760 hours.  ProBNP (last 3 results) No results for input(s): PROBNP in the last 8760 hours.  CBG: No results for input(s): GLUCAP in the last 168 hours.     SignedOswald Hillock MD.  [facility] 05/02/2016, 12:33 PM

## 2016-05-02 NOTE — Progress Notes (Signed)
Patient refused all blood draws. Patient and patient's family made it perfectly clear that this patient has very little time left and only want the pain controlled. Called pheblotomy and canceled labs for this patient. MD will have to reorder for any future order needs. Patient will need comfort care and probably a palliative consult. Will continue to monitor.

## 2016-05-02 NOTE — ED Notes (Signed)
Attempted report 

## 2016-05-02 NOTE — Progress Notes (Addendum)
[address]       [place] [address]             [phone]          Subjective: Feels better  Objective: Vital signs in last 24 hours: Temp:  [99.2 F (37.3 C)-99.3 F (37.4 C)] 99.3 F (37.4 C) (05/23 0235) Pulse Rate:  [95-115] 98 (05/23 0353) Cardiac Rhythm:  [-] Sinus tachycardia (05/22 1524) Resp:  [16-35] 18 (05/23 0353) BP: (82-114)/(58-81) 107/73 mmHg (05/23 0235) SpO2:  [93 %-100 %] 98 % (05/23 0353) FiO2 (%):  [28 %] 28 % (05/23 0353) Weight:  [187 lb 6.4 oz (85.004 kg)] 187 lb 6.4 oz (85.004 kg) (05/23 0235)  Hemodynamic parameters for last 24 hours:    Intake/Output from previous day: 05/22 0701 - 05/23 0700 In: -  Out: 900 [Urine:900] Intake/Output this shift:    General appearance: alert, cooperative and no distress Heart: regular rate and rhythm and tachy Lungs: coarse with ronchi Wound: incis healing well  Lab Results:  Recent Labs  05/01/16 1406  WBC 10.0  HGB 12.6*  HCT 40.5  PLT 366   BMET:  Recent Labs  05/01/16 1406  NA 137  K 4.0  CL 104  CO2 25  GLUCOSE 111*  BUN 7  CREATININE 0.73  CALCIUM 8.9    PT/INR: No results for input(s): LABPROT, INR in the last 72 hours. ABG    Component Value Date/Time   PHART 7.447 04/21/2016 0550   HCO3 17.7* 04/21/2016 0550   TCO2 18.5 04/21/2016 0550   ACIDBASEDEF 5.6* 04/21/2016 0550   O2SAT 94.9 04/21/2016 0550   CBG (last 3)  No results for input(s): GLUCAP in the last 72 hours.  Meds Scheduled Meds: . heparin  5,000 Units Subcutaneous Q8H  . mirtazapine  15 mg Oral QHS  . multivitamin with minerals  1 tablet Oral Daily  . piperacillin-tazobactam (ZOSYN)  IV  3.375 g Intravenous Q8H  . sodium chloride flush  3 mL Intravenous Q12H   Continuous Infusions:  PRN Meds:.acetaminophen **OR** acetaminophen, anti-nausea, benzonatate, bisacodyl, guaiFENesin, HYDROcodone-acetaminophen, HYDROmorphone (DILAUDID) injection, ipratropium-albuterol, LORazepam,  ondansetron **OR** ondansetron (ZOFRAN) IV, polyethylene glycol, zolpidem  Xrays Dg Chest 2 View  05/01/2016  CLINICAL DATA:  Chest Pain Sob Cough Worse Today Surgery 1 Week Ago For effusions EXAM: CHEST  2 VIEW COMPARISON:  04/24/2016 FINDINGS: Right-sided Port-A-Cath terminates at the low SVC versus cavoatrial junction. Tracheostomy. Midline trachea. Normal heart size. Small bilateral pleural effusions are decreased. No pneumothorax. No congestive failure. Improved bibasilar and right infrahilar airspace disease. IMPRESSION: Improved aeration with decreased tiny pleural effusions and lower lobe predominant airspace disease, likely atelectasis. Electronically Signed   By: Abigail Miyamoto M.D.   On: 05/01/2016 14:17   Ct Angio Chest Pe W/cm &/or Wo Cm  05/01/2016  CLINICAL DATA:  Shortness of breath and mid chest pain radiating to the right side. History of esophageal cancer in 2014. EXAM: CT ANGIOGRAPHY CHEST WITH CONTRAST TECHNIQUE: Multidetector CT imaging of the chest was performed using the standard protocol during bolus administration of intravenous contrast. Multiplanar CT image reconstructions and MIPs were obtained to evaluate the vascular anatomy. CONTRAST:  100 cc Isovue 370. COMPARISON:  Chest radiograph 05/01/2016, CT of the chest 04/20/2016 FINDINGS: Mediastinum/Lymph Nodes: No pulmonary emboli or thoracic aortic dissection identified. There has been interval decrease in the previously identified pericardial effusion with maximum thickness now measuring 8 mm. The heart is otherwise normal in size.  There are stable postsurgical changes from esophagectomy at gastric pull-through. Tracheostomy tube is in place. Right internal jugular approach injectable port terminates in the superior vena cava. There are stable to slightly worsened hilar lymphadenopathy versus metastatic masses with a nodal mass in the right hilum compressing the takeoff of the right lower lobe pulmonary artery to a half of its  normal caliber. The right middle lobe pulmonary artery is compressed to a sliver. There is an associated near complete obliteration of the right middle lobe bronchus, and mild narrowing of the right upper and lower lobe bronchi. There is a 10 mm precarinal mediastinal lymph node, stable. Lungs/Pleura: There are bilateral pleural effusions, not significantly changed in size. A near complete collapse of the right middle lobe is stable. Nodular metastases in the left lower lobe is stable. There is slight increase in the nodular metastases in the lingula now measuring 15 mm. There is a new ground-glass opacity in the posterior perifissural aspect of the right upper lobe, image 58 lung windows. Upper abdomen: No acute findings. Musculoskeletal: No chest wall mass or suspicious bone lesions identified. Review of the MIP images confirms the above findings. IMPRESSION: Slight worsening of right hilar lymphadenopathy versus metastatic disease causing near complete obliteration of the right middle lobe bronchus with near complete collapse of the right middle lobe, near complete obliteration of the right middle lobe pulmonary artery, and narrowing of the right lower lobe pulmonary artery. Interval decrease in the size of the previously identified pericardial effusion. Stable bilateral pleural effusions. Interval development of apparently acute ground-glass consolidation in the right upper lobe. Slight increase in the nodular metastatic disease in the lingula. Stable left lower lobe pulmonary metastatic disease. No evidence of pulmonary embolus or thoracic dissection. Electronically Signed   By: Fidela Salisbury M.D.   On: 05/01/2016 18:53    Assessment/Plan:  1 CTA scan results noted 2 plans as per medicine/oncology  LOS: 0 days    GOLD,WAYNE E 05/02/2016  Patient seen and examined, agree with above He is feeling better today Reviewed CT- minimal pericardial fluid, other findings as noted in report Will  remove CT suture Plan per Hospitalists  Maurico Perrell C. Roxan Hockey, MD [facility] [phone]

## 2016-05-03 LAB — URINE CULTURE: CULTURE: NO GROWTH

## 2016-05-05 ENCOUNTER — Other Ambulatory Visit: Payer: Self-pay | Admitting: Thoracic Surgery (Cardiothoracic Vascular Surgery)

## 2016-05-05 ENCOUNTER — Encounter: Payer: Self-pay | Admitting: *Deleted

## 2016-05-05 ENCOUNTER — Telehealth: Payer: Self-pay | Admitting: Oncology

## 2016-05-05 ENCOUNTER — Ambulatory Visit (HOSPITAL_BASED_OUTPATIENT_CLINIC_OR_DEPARTMENT_OTHER): Payer: BLUE CROSS/BLUE SHIELD | Admitting: Oncology

## 2016-05-05 ENCOUNTER — Other Ambulatory Visit: Payer: Self-pay | Admitting: *Deleted

## 2016-05-05 ENCOUNTER — Telehealth: Payer: Self-pay | Admitting: *Deleted

## 2016-05-05 VITALS — BP 98/70 | HR 121 | Temp 97.6°F | Resp 17 | Ht 72.0 in | Wt 177.1 lb

## 2016-05-05 DIAGNOSIS — C159 Malignant neoplasm of esophagus, unspecified: Secondary | ICD-10-CM

## 2016-05-05 DIAGNOSIS — G893 Neoplasm related pain (acute) (chronic): Secondary | ICD-10-CM | POA: Diagnosis not present

## 2016-05-05 DIAGNOSIS — F419 Anxiety disorder, unspecified: Secondary | ICD-10-CM

## 2016-05-05 DIAGNOSIS — C155 Malignant neoplasm of lower third of esophagus: Secondary | ICD-10-CM | POA: Diagnosis not present

## 2016-05-05 MED ORDER — BENZONATATE 100 MG PO CAPS: 100.0000 mg | ORAL_CAPSULE | Freq: Three times a day (TID) | ORAL | Status: DC | PRN

## 2016-05-05 MED ORDER — HYDROCODONE-ACETAMINOPHEN 7.5-325 MG/15ML PO SOLN: 10.0000 mL | Freq: Four times a day (QID) | ORAL | Status: DC | PRN

## 2016-05-05 MED ORDER — MIRTAZAPINE 15 MG PO TABS: 15.0000 mg | ORAL_TABLET | Freq: Every day | ORAL | Status: DC

## 2016-05-05 MED ORDER — ALPRAZOLAM 0.5 MG PO TABS: 0.5000 mg | ORAL_TABLET | Freq: Three times a day (TID) | ORAL | Status: DC | PRN

## 2016-05-05 MED FILL — HYDROCOD-APAP 7.5-325/15ML: 7.5-325 | 3 days supply | Qty: 120 | Fill #0

## 2016-05-05 MED FILL — BENZONATATE 100 MG CAPSULE: 100 | 30 days supply | Qty: 90 | Fill #0 | Status: TO

## 2016-05-05 MED FILL — MIRTAZAPINE 15 MG TABLET: 15 | 30 days supply | Qty: 30 | Fill #0 | Status: TO

## 2016-05-05 NOTE — Progress Notes (Signed)
Oncology Nurse Navigator Documentation  Oncology Nurse Navigator Flowsheets 05/05/2016  Navigator Location [facility]  Navigator Encounter Type Follow-up Appt  Patient Visit Type MedOnc;Follow-up  Treatment Phase Pre-Tx/Tx Discussion  Barriers/Navigation Needs Coordination of Care;Education  Education Preparing for Upcoming Surgery/ Treatment  Interventions Coordination of Care;Education Method  Coordination of Care Other--in basket message to Dr. Blanch Media nurse requesting appointment for esophageal dilation again at patient request. Spoke with infusion scheduler to get his chemo scheduled for 05/12/16  Education Method Written--provided printed information on Taxol and Cyramza after MD reviewed side effect potential  Support Groups/Services -  Acuity Level 2  Time Spent with Patient 30  Reviewed meds with patient and Dr. Benay Spice. [place] with refills and quantity increase.

## 2016-05-05 NOTE — Progress Notes (Signed)
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 His symptoms improved with anxiolytics and analgesics. He continues to have a cough, improved with Tessalon. Xanax has helped the anxiety.  He was discharged 05/02/2016. I saw him in the hospital. We discussed treatment options. He indicated he would like to proceed with salvage systemic therapy. He confirmed this today.  His appetite remains poor. He plans to see Dr. Henrene Pastor to consider an esophageal dilatation.   Objective:  Vital signs in last 24 hours:  Blood pressure 98/70, pulse 121, temperature 97.6 F (36.4 C), temperature source Oral, resp. rate 17, height 6' (1.829 m), weight 177 lb 1.6 oz (80.332 kg), SpO2 97 %.    HEENT:  neck without mass, tracheostomy in place Lymphatics:  no cervical or supraclavicular nodes Resp:  decreased breath sounds at the lower chest bilaterally, no respiratory distress, good air movement bilaterally Cardio:  regular rate and rhythm GI:  no hepatomegaly, nontender, no mass Vascular:  no leg edema  SkHealed subxiphoid incision   Portacath/PICC-without erythema  Lab Results:    Lab Results  Component Value Date   CEA1 470.6* 02/25/2016    Imaging:  Dg Chest 2 View  05/01/2016  CLINICAL DATA:  Chest Pain Sob Cough Worse Today Surgery 1 Week Ago For effusions EXAM: CHEST  2 VIEW COMPARISON:  04/24/2016 FINDINGS: Right-sided Port-A-Cath terminates at the low SVC versus cavoatrial junction. Tracheostomy. Midline trachea. Normal heart size. Small bilateral pleural effusions are decreased. No pneumothorax. No congestive failure. Improved bibasilar and right infrahilar airspace disease. IMPRESSION: Improved aeration with decreased tiny pleural effusions and lower lobe predominant airspace disease, likely atelectasis. Electronically Signed   By: Abigail Miyamoto M.D.    On: 05/01/2016 14:17   Ct Angio Chest Pe W/cm &/or Wo Cm  05/01/2016  CLINICAL DATA:  Shortness of breath and mid chest pain radiating to the right side. History of esophageal cancer in 2014. EXAM: CT ANGIOGRAPHY CHEST WITH CONTRAST TECHNIQUE: Multidetector CT imaging of the chest was performed using the standard protocol during bolus administration of intravenous contrast. Multiplanar CT image reconstructions and MIPs were obtained to evaluate the vascular anatomy. CONTRAST:  100 cc Isovue 370. COMPARISON:  Chest radiograph 05/01/2016, CT of the chest 04/20/2016 FINDINGS: Mediastinum/Lymph Nodes: No pulmonary emboli or thoracic aortic dissection identified. There has been interval decrease in the previously identified pericardial effusion with maximum thickness now measuring 8 mm. The heart is otherwise normal in size. There are stable postsurgical changes from esophagectomy at gastric pull-through. Tracheostomy tube is in place. Right internal jugular approach injectable port terminates in the superior vena cava. There are stable to slightly worsened hilar lymphadenopathy versus metastatic masses with a nodal mass in the right hilum compressing the takeoff of the right lower lobe pulmonary artery to a half of its normal caliber. The right middle lobe pulmonary artery is compressed to a sliver. There is an associated near complete obliteration of the right middle lobe bronchus, and mild narrowing of the right upper and lower lobe bronchi. There is a 10 mm precarinal mediastinal lymph node, stable. Lungs/Pleura: There are bilateral pleural effusions, not significantly changed in size. A near complete collapse of the right middle lobe is stable. Nodular metastases in the left lower lobe is stable. There is slight increase in the nodular metastases in the lingula now measuring 15 mm. There is a new ground-glass opacity in  the posterior perifissural aspect of the right upper lobe, image 58 lung windows. Upper  abdomen: No acute findings. Musculoskeletal: No chest wall mass or suspicious bone lesions identified. Review of the MIP images confirms the above findings. IMPRESSION: Slight worsening of right hilar lymphadenopathy versus metastatic disease causing near complete obliteration of the right middle lobe bronchus with near complete collapse of the right middle lobe, near complete obliteration of the right middle lobe pulmonary artery, and narrowing of the right lower lobe pulmonary artery. Interval decrease in the size of the previously identified pericardial effusion. Stable bilateral pleural effusions. Interval development of apparently acute ground-glass consolidation in the right upper lobe. Slight increase in the nodular metastatic disease in the lingula. Stable left lower lobe pulmonary metastatic disease. No evidence of pulmonary embolus or thoracic dissection. Electronically Signed   By: Fidela Salisbury M.D.   On: 05/01/2016 18:53    Medications: I have reviewed the patient's current medications.  Assessment/Plan: 1.Adenocarcinoma of the distal esophagus/gastric cardia-status post an endoscopic biopsy on 03/18/2013 confirming adenocarcinoma, HER-2/neu not amplified  -Staging CT scans 03/20/2013 confirmed a distal esophageal/gastric cardia mass, distal paraesophageal lymph nodes and paratracheal nodes concerning for metastatic lymphadenopathy  -A PET scan 03/27/2013 with a multifocal area of hypermetabolic activity involving the thoracic esophagus extending into the gastric cardia and hypermetabolic mediastinal nodes, no distant metastatic disease  -Initiation of radiation 04/07/2013 and weekly Taxol/carboplatin 04/08/2013, radiation completed 05/15/2013  -Restaging PET scan 06/09/2013-no evidence of metastatic disease, no hypermetabolic lymphadenopathy  -Esophagogastrectomy 07/07/2013 revealed a ypT1b,ypN0 tumor with negative surgical margins  -CT neck 06/09/2014 revealed a necrotic lymph  node in the right tracheoesophageal groove  -PET scan 11/15/2014 revealed a single focus of hypermetabolism corresponding to the necrotic right upper mediastinum lymph node -EUS biopsy of the paraesophageal lymph node 06/25/2012 confirmed metastatic adenocarcinoma  -Radiation completed 08/11/2014 -Rising CEA May 2016 -PET scan 07/13/2015 with hypermetabolic right paratracheal and right hilar adenopathy -Cycle 1 FOLFOX 08/03/2015 -Cycle 2 FOLFOX 08/17/2015 - cycle 3 FOLFOX 08/31/2015 (oxaliplatin dose reduced secondary to thrombocytopenia)  -Cycle 4 FOLFOX 09/14/2015 -Cycle 5 FOLFOX 09/28/2015 - restaging CTs 09/30/2015 with stable disease involving neck/chest lymph nodes, a slight increase in size of left upper lobe lung nodule  - cycle 6 FOLFOX 10/12/2015  -FOLFOX discontinued secondary to development of bilateral vocal cord paralysis November 2016 - CT scan of the neck and chest on 11/22/15 revealed no definite disease in the neck. At the thoracic inlet region, there is questionably slight increase in prominence of soft tissue at the right tracheoesophageal groove. This could be an enlarging node or mass but could also relate to the esophagectomy and pull-through surgery.Enlarging lingular and left lower lobe nodules are worrisome for metastatic disease. Peribronchovascular nodular consolidation in the anterior right lower lobe, slightly more prominent, indeterminate. Cholelithiasis. Left diaphragmatic hernia, stable. -Xeloda 1 week on/1 week off beginning 12/11/2015 -Restaging CTs of the neck and chest 02/07/2016 revealed enlargement of 2 left-sided lung nodules, no other evidence of disease progression -Cycle 1 Pembrolizumab 02/25/2016  -Cycle 2 Pembrolizumab 03/17/2016 -Cycle 3 Pembrolizumab 04/07/2016   -CT 04/20/2016 consistent with progressive disease involving a large pericardial effusion, bilateral pleural effusions, enlarged mediastinal lymph nodes and lung  nodules  Cytology from the pericardial fluid 04/21/2016-positive for metastatic adenocarcinoma -CT 05/01/2016 was slight worsening of right hilar lymphadenopathy with near complete collapse of the right middle lobe, nodular metastases in the left lung, stable bilateral pleural effusions  2. Indeterminate 15 mm right liver lesion on the CT  scan 03/20/2013, MRI of the liver 06/09/2013 confirmed a right liver hemangioma  3. Anorexia/weight loss and solid dysphagia secondary to #1 -improved 4. History of a colon polyp, status post a polypectomy October 2013  5. history of Thrombocytopenia secondary chemotherapy-the carboplatin wase dose reduced with week 5 of chemotherapy chemotherapy  6. hoarseness secondary to right vocal cord paralysis-CT/PET scan imaging consistent with a malignant right paratracheal lymph node causing the vocal cord paralysis  -Status post a laryngeal plasty at [facility] on 09/02/2014 7. Esophageal stricture-status post dilation 06/08/2015 -Food impaction removal 09/29/2015 -Esophageal stricture dilation 10/06/2015 8. Port-A-Cath placement 07/20/2015 9. Delayed nausea following cycle 1 FOLFOX. Aloxi and Emend added with cycle 2. 10. Thrombocytopenia secondary to chemotherapy  11. Emotional lability/depression-prophylactic Decadron discontinued; Remeron initiated 12. Progressive hoarseness November 2016, diagnosed with bilateral vocal cord paralysis, status post a tracheostomy and bilateral injection laryngoplasty at [facility] on 10/27/2015 13. 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 He has improved from a respiratory standpoint following the pericardial window procedure. The pericardial window confirmed metastatic adenocarcinoma.  I reviewed the recent CTs with Dr.Santosuosso and his wife. We discussed treatment options. He understands no therapy will be curative. I estimate and  averages survival of a few months in his case. He would like to proceed with a trial of salvage systemic therapy. I have communicated with Dr.Uronis. We recommend Taxol/ramucirumab.  I reviewed the potential toxicities associated with this regimen including the chance for hematologic toxicity, an allergic reaction, neuropathy, infection, thromboembolic disease, bowel perforation, and bleeding. He understands the increased risk of toxicity with his borderline performance status. He agrees to proceed.  He will continue Xanax for anxiety and  hydrocodone for pain. He will contact Dr. Henrene Pastor to discuss and esophagus dilatation procedure. A first treatment with Taxol/ ramucirumab is scheduled for 05/12/2016.  We discussed CPR and ACLS issues. He will be placed on a no CODE BLUE status.  He will be seen for an office visit prior to treatment next week.    Betsy Coder, MD  05/05/2016  1:57 PM

## 2016-05-05 NOTE — Telephone Encounter (Signed)
Per staff phone call and POF I have schedueld appts. Scheduler advised of appts. Ok per [employer] to start treatment at 12pm. JMW

## 2016-05-05 NOTE — Telephone Encounter (Signed)
per pof to sch pt appt-gave pt copy of avs-MW sch trmt

## 2016-05-07 LAB — CULTURE, BLOOD (ROUTINE X 2): Culture: NO GROWTH

## 2016-05-08 ENCOUNTER — Other Ambulatory Visit: Payer: Self-pay | Admitting: Oncology

## 2016-05-09 ENCOUNTER — Ambulatory Visit (INDEPENDENT_AMBULATORY_CARE_PROVIDER_SITE_OTHER): Payer: Self-pay | Admitting: Thoracic Surgery (Cardiothoracic Vascular Surgery)

## 2016-05-09 ENCOUNTER — Encounter: Payer: Self-pay | Admitting: Thoracic Surgery (Cardiothoracic Vascular Surgery)

## 2016-05-09 ENCOUNTER — Ambulatory Visit
Admission: RE | Admit: 2016-05-09 | Discharge: 2016-05-09 | Disposition: A | Discharge status: Home / Self Care | Payer: BLUE CROSS/BLUE SHIELD | Source: Ambulatory Visit | Attending: Thoracic Surgery (Cardiothoracic Vascular Surgery) | Admitting: Thoracic Surgery (Cardiothoracic Vascular Surgery)

## 2016-05-09 VITALS — BP 96/71 | HR 110 | Resp 20 | Ht 72.0 in | Wt 177.0 lb

## 2016-05-09 DIAGNOSIS — I3139 Other pericardial effusion (noninflammatory): Secondary | ICD-10-CM

## 2016-05-09 DIAGNOSIS — I313 Pericardial effusion (noninflammatory): Secondary | ICD-10-CM

## 2016-05-09 DIAGNOSIS — I319 Disease of pericardium, unspecified: Secondary | ICD-10-CM

## 2016-05-09 DIAGNOSIS — C159 Malignant neoplasm of esophagus, unspecified: Secondary | ICD-10-CM

## 2016-05-09 MED FILL — ALPRAZolam 0.5 MG TABS: 0.5 | 20 days supply | Qty: 60 | Fill #0 | Status: TO

## 2016-05-09 NOTE — Progress Notes (Signed)
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 Echocardiogram showed a massive pericardial effusion with cardiac tamponade. He was taken to the operating room for subxiphoid pericardial window on 04/22/2016. 1.5 L of bloody fluid was drained. He improved significantly postdrainage was discharged on 04/26/2016.  He was then readmitted overnight with presumptive pneumonia, although this may just represent progression of his metastatic disease.  He saw Dr. Benay Spice recently and is planning to undergo salvage chemotherapy with Taxol and ramucirumab.  He has been having trouble swallowing and wants to know if he can have an esophageal dilatation.  He says his pain is typically about 3 out of 10 from his incision. It does hurt when he coughs. He is currently on hydrocodone. Past Medical History  Diagnosis Date  . Hyperlipemia   . Colon polyp   . Pre-diabetes   . GERD (gastroesophageal reflux disease)   . History of radiation therapy 04/07/13-05-15-13    [place]50.4[place]/42[place]  . Sleep apnea     no c-pap at this time  . Anxiety   . [place]     [place] denies.  . [place]    Adenocarcinoma  . Diverticulosis         Current Outpatient Prescriptions  Medication Sig Dispense Refill  . acetaminophen (TYLENOL) 500 MG tablet Take 1,000 mg by mouth every 6 (six) hours as needed for mild pain or moderate pain.    .Marland KitchenALPRAZolam (XANAX) 0.5 MG tablet Take 1 tablet (0.5 mg total) by mouth 3 (three) times daily as needed for anxiety. 60 tablet 1  . anti-nausea (EMETROL) solution Take 10 mLs by mouth every 15 (fifteen) minutes as needed for nausea or  vomiting.    . benzonatate (TESSALON) 100 MG capsule Take 1 capsule (100 mg total) by mouth 3 (three) times daily as needed for cough. 90 capsule 1  . brompheniramine-pseudoephedrine (DIMETAPP) 1-15 MG/5ML ELIX Take by mouth 2 (two) times daily as needed for allergies.    . cefdinir (OMNICEF) 250 MG/5ML suspension Take 6 mLs by mouth 2 (two) times daily. For 7 days. Started medication 04/26/16  0  . eszopiclone (LUNESTA) 1 MG TABS tablet TAKE 1-2 TABLETS BY MOUTH AT BEDTIME AS NEEDED FOR SLEEP 30 tablet 0  . guaiFENesin (ROBITUSSIN) 100 MG/5ML liquid Take 10 mLs (200 mg total) by mouth 3 (three) times daily as needed for cough. 120 mL 2  . HYDROcodone-acetaminophen (HYCET) 7.5-325 mg/15 ml solution Take 10 mLs by mouth every 6 (six) hours as needed for moderate pain. 120 mL 0  . ibuprofen (ADVIL,MOTRIN) 200 MG tablet Take 200 mg by mouth every 8 (eight) hours as needed.    . lactase (LACTAID) 3000 UNITS tablet Take 3,000 Units by mouth daily as needed (lactose intolerance). For lactose intolerance    . levofloxacin (LEVAQUIN) 750 MG tablet Take 1 tablet (750 mg total) by mouth daily. 5 tablet 0  . lidocaine-prilocaine (EMLA) cream Apply 1 application topically as needed. Apply tablespoon to PAC 2 hours prior to stick and cover with plastic wrap 30 g 5  . mirtazapine (REMERON) 15  MG tablet Take 1 tablet (15 mg total) by mouth at bedtime. 30 tablet 1  . Multiple Vitamin (MULTIVITAMIN WITH MINERALS) TABS tablet Take 1 tablet by mouth daily.     No current facility-administered medications for this visit.    Physical Exam BP 96/71 mmHg  Pulse 110  Resp 20  Ht 6' (1.829 m)  Wt 177 lb (80.287 kg)  BMI 24.00 kg/m2  SpO2 97% Ill-appearing 50 year old man in no acute distress Tracheostomy in place Alert and oriented 3 Lungs equal breath sounds bilaterally Cardiac regular rate and rhythm no rub Incision clean dry and intact  Diagnostic Tests: CHEST 2 VIEW  COMPARISON: 05/01/2016 . CT  05/01/2016.  FINDINGS: Tracheostomy tube in stable position. PowerPort catheter stable position. Right perihilar mass and or infiltrate is unchanged. Low lung volumes with bibasilar atelectasis. No pleural effusion or pneumothorax.  IMPRESSION: 1. Tracheostomy tube prior poor catheter stable position.  2. Stable right perihilar mass and or infiltrate. Stable bibasilar atelectasis.   Electronically Signed  By: Marcello Moores Register  On: 05/09/2016 13:47 I personally reviewed the chest x-ray findings as noted. His cardiac silhouette appears relatively normal.  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 He has had progression of disease recently. He recently had a subxiphoid pericardial window for a massive pericardial effusion of 1500 ml. The fluid was positive for metastatic adenocarcinoma.   From a surgical standpoint he's doing well. He is having some incisional discomfort and is using hydrocodone for that.   I advised him not to lift over 10 pounds until 6 weeks postoperatively.  He asked for a handicap placard for parking. We will fill that form out for him.  There is no issue from my standpoint with proceeding with esophageal dilatation.  He is being followed closely by Dr. Julieanne Manson  Plan: I will be happy to see him back any time if I can be of any further assistance with his care.  Melrose Nakayama, MD [facility] [phone]

## 2016-05-10 ENCOUNTER — Telehealth: Payer: Self-pay

## 2016-05-10 ENCOUNTER — Other Ambulatory Visit: Payer: Self-pay

## 2016-05-10 DIAGNOSIS — R1310 Dysphagia, unspecified: Secondary | ICD-10-CM

## 2016-05-10 NOTE — Telephone Encounter (Signed)
Spoke with pt to let him know the appt date and time. Pt states he cannot do this day, they will be out of town. Please advise.

## 2016-05-10 NOTE — Telephone Encounter (Signed)
Called [facility] to move procedure to 07/04/16 and he would be the 5th case at 12:30pm. Please advise if you are ok with this appt.

## 2016-05-10 NOTE — Telephone Encounter (Signed)
My half-day Hospital block later in July then. With Propofol

## 2016-05-10 NOTE — Telephone Encounter (Signed)
Slots blocked per Dr. Blanch Media request, pt aware of appt.and prep.

## 2016-05-10 NOTE — Telephone Encounter (Signed)
-----   Message from Irene Shipper, MD sent at 05/10/2016 11:04 AM EDT ----- Regarding: RE: Esophageal dilation Patty or Vaughan Basta, Please set patient up for EGD with balloon dilation at the hospital. Needs propofol. Try to put him on for Thursday, July 6 to follow my only other case at 9 AM. Tell him to chew his food extremely well and stay away from meat until then. Thanks ----- Message -----    From: Barron Alvine, RN    Sent: 05/05/2016   4:30 PM      To: Irene Shipper, MD Subject: FW: Esophageal dilation                        Dr Henrene Pastor Please advise ----- Message -----    From: Tania Ade, RN    Sent: 05/05/2016   1:49 PM      To: Algernon Huxley, RN Subject: Esophageal dilation                            Dayquan requested we reach out to you and see if Dr. Henrene Pastor will schedule him for a repeat esophageal dilation. Dysphagia worse. OK per Dr. Benay Spice.  Thank you, Manuela Schwartz

## 2016-05-10 NOTE — Telephone Encounter (Signed)
That is fine, but block my 1:30 and 2 PM office slots that afternoon as I will be running late

## 2016-05-11 ENCOUNTER — Other Ambulatory Visit: Payer: Self-pay

## 2016-05-11 DIAGNOSIS — R1310 Dysphagia, unspecified: Secondary | ICD-10-CM

## 2016-05-12 ENCOUNTER — Telehealth: Payer: Self-pay | Admitting: Oncology

## 2016-05-12 ENCOUNTER — Other Ambulatory Visit: Payer: BLUE CROSS/BLUE SHIELD

## 2016-05-12 ENCOUNTER — Ambulatory Visit (HOSPITAL_COMMUNITY)
Admission: RE | Admit: 2016-05-12 | Discharge: 2016-05-12 | Disposition: A | Discharge status: Home / Self Care | Payer: BLUE CROSS/BLUE SHIELD | Source: Ambulatory Visit | Attending: Oncology | Admitting: Oncology

## 2016-05-12 ENCOUNTER — Ambulatory Visit (HOSPITAL_BASED_OUTPATIENT_CLINIC_OR_DEPARTMENT_OTHER): Payer: BLUE CROSS/BLUE SHIELD | Admitting: Oncology

## 2016-05-12 ENCOUNTER — Ambulatory Visit: Payer: BLUE CROSS/BLUE SHIELD | Admitting: Nurse Practitioner

## 2016-05-12 ENCOUNTER — Ambulatory Visit: Payer: BLUE CROSS/BLUE SHIELD

## 2016-05-12 ENCOUNTER — Ambulatory Visit (HOSPITAL_BASED_OUTPATIENT_CLINIC_OR_DEPARTMENT_OTHER): Payer: BLUE CROSS/BLUE SHIELD

## 2016-05-12 ENCOUNTER — Telehealth: Payer: Self-pay | Admitting: *Deleted

## 2016-05-12 ENCOUNTER — Other Ambulatory Visit (HOSPITAL_BASED_OUTPATIENT_CLINIC_OR_DEPARTMENT_OTHER): Payer: BLUE CROSS/BLUE SHIELD

## 2016-05-12 VITALS — BP 100/66 | HR 82 | Temp 98.0°F | Resp 16

## 2016-05-12 VITALS — BP 95/62 | HR 114 | Temp 97.7°F | Resp 18 | Ht 72.0 in | Wt 172.2 lb

## 2016-05-12 DIAGNOSIS — R634 Abnormal weight loss: Secondary | ICD-10-CM | POA: Diagnosis not present

## 2016-05-12 DIAGNOSIS — C155 Malignant neoplasm of lower third of esophagus: Secondary | ICD-10-CM

## 2016-05-12 DIAGNOSIS — Z5111 Encounter for antineoplastic chemotherapy: Secondary | ICD-10-CM

## 2016-05-12 DIAGNOSIS — R630 Anorexia: Secondary | ICD-10-CM | POA: Diagnosis not present

## 2016-05-12 DIAGNOSIS — R1310 Dysphagia, unspecified: Secondary | ICD-10-CM | POA: Diagnosis not present

## 2016-05-12 DIAGNOSIS — Z5112 Encounter for antineoplastic immunotherapy: Secondary | ICD-10-CM | POA: Diagnosis not present

## 2016-05-12 DIAGNOSIS — D696 Thrombocytopenia, unspecified: Secondary | ICD-10-CM

## 2016-05-12 DIAGNOSIS — M7989 Other specified soft tissue disorders: Secondary | ICD-10-CM | POA: Insufficient documentation

## 2016-05-12 DIAGNOSIS — Z95828 Presence of other vascular implants and grafts: Secondary | ICD-10-CM | POA: Insufficient documentation

## 2016-05-12 LAB — COMPREHENSIVE METABOLIC PANEL
ALBUMIN: 3 g/dL — AB (ref 3.5–5.0)
ALK PHOS: 141 U/L (ref 40–150)
ALT: 13 U/L (ref 0–55)
AST: 14 U/L (ref 5–34)
Anion Gap: 9 mEq/L (ref 3–11)
BUN: 7.6 mg/dL (ref 7.0–26.0)
CHLORIDE: 106 meq/L (ref 98–109)
CO2: 25 mEq/L (ref 22–29)
Calcium: 9 mg/dL (ref 8.4–10.4)
Creatinine: 0.7 mg/dL (ref 0.7–1.3)
GLUCOSE: 167 mg/dL — AB (ref 70–140)
POTASSIUM: 4 meq/L (ref 3.5–5.1)
SODIUM: 140 meq/L (ref 136–145)
Total Bilirubin: 0.64 mg/dL (ref 0.20–1.20)
Total Protein: 7.1 g/dL (ref 6.4–8.3)

## 2016-05-12 LAB — CBC WITH DIFFERENTIAL/PLATELET
BASO%: 0.7 % (ref 0.0–2.0)
Basophils Absolute: 0 10*3/uL (ref 0.0–0.1)
EOS ABS: 0.1 10*3/uL (ref 0.0–0.5)
EOS%: 1.2 % (ref 0.0–7.0)
HEMATOCRIT: 39.1 % (ref 38.4–49.9)
HGB: 12.5 g/dL — ABNORMAL LOW (ref 13.0–17.1)
LYMPH#: 0.6 10*3/uL — AB (ref 0.9–3.3)
LYMPH%: 10.5 % — ABNORMAL LOW (ref 14.0–49.0)
MCH: 27.1 pg — ABNORMAL LOW (ref 27.2–33.4)
MCHC: 32.1 g/dL (ref 32.0–36.0)
MCV: 84.6 fL (ref 79.3–98.0)
MONO#: 0.5 10*3/uL (ref 0.1–0.9)
MONO%: 9.1 % (ref 0.0–14.0)
NEUT%: 78.5 % — AB (ref 39.0–75.0)
NEUTROS ABS: 4.3 10*3/uL (ref 1.5–6.5)
PLATELETS: 270 10*3/uL (ref 140–400)
RBC: 4.62 10*6/uL (ref 4.20–5.82)
RDW: 15.1 % — ABNORMAL HIGH (ref 11.0–14.6)
WBC: 5.4 10*3/uL (ref 4.0–10.3)

## 2016-05-12 MED ORDER — ACETAMINOPHEN 325 MG PO TABS
650.0000 mg | ORAL_TABLET | Freq: Once | ORAL | Status: DC
Start: 2016-05-12 — End: 2016-05-12

## 2016-05-12 MED ORDER — SODIUM CHLORIDE 0.9 % IJ SOLN
10.0000 mL | INTRAMUSCULAR | Status: DC | PRN
  Administered 2016-05-12: 10 mL via INTRAVENOUS
  Filled 2016-05-12: qty 10

## 2016-05-12 MED ORDER — SODIUM CHLORIDE 0.9 % IV SOLN
80.0000 mg/m2 | Freq: Once | INTRAVENOUS | Status: AC
  Administered 2016-05-12: 162 mg via INTRAVENOUS
  Filled 2016-05-12: qty 27

## 2016-05-12 MED ORDER — ACETAMINOPHEN 160 MG/5ML PO SOLN
650.0000 mg | Freq: Once | ORAL | Status: AC
  Administered 2016-05-12: 650 mg via ORAL
  Filled 2016-05-12: qty 20.3

## 2016-05-12 MED ORDER — SODIUM CHLORIDE 0.9 % IV SOLN
Freq: Once | INTRAVENOUS | Status: AC
  Administered 2016-05-12: 13:00:00 via INTRAVENOUS

## 2016-05-12 MED ORDER — DIPHENHYDRAMINE HCL 50 MG/ML IJ SOLN
INTRAMUSCULAR | Status: AC
  Filled 2016-05-12: qty 1

## 2016-05-12 MED ORDER — ACETAMINOPHEN 325 MG PO TABS
ORAL_TABLET | ORAL | Status: AC
  Filled 2016-05-12: qty 2

## 2016-05-12 MED ORDER — HYDROCODONE-ACETAMINOPHEN 7.5-325 MG/15ML PO SOLN: 10.0000 mL | Freq: Four times a day (QID) | ORAL | Status: DC | PRN

## 2016-05-12 MED ORDER — FAMOTIDINE IN NACL 20-0.9 MG/50ML-% IV SOLN
20.0000 mg | Freq: Once | INTRAVENOUS | Status: AC
  Administered 2016-05-12: 20 mg via INTRAVENOUS

## 2016-05-12 MED ORDER — DEXAMETHASONE SODIUM PHOSPHATE 100 MG/10ML IJ SOLN
10.0000 mg | Freq: Once | INTRAMUSCULAR | Status: AC
  Administered 2016-05-12: 10 mg via INTRAVENOUS
  Filled 2016-05-12: qty 1

## 2016-05-12 MED ORDER — SODIUM CHLORIDE 0.9 % IV SOLN
8.0000 mg/kg | Freq: Once | INTRAVENOUS | Status: AC
  Administered 2016-05-12: 600 mg via INTRAVENOUS
  Filled 2016-05-12: qty 50

## 2016-05-12 MED ORDER — SODIUM CHLORIDE 0.9 % IV SOLN: Freq: Once | INTRAVENOUS | Status: DC

## 2016-05-12 MED ORDER — DEXAMETHASONE 4 MG PO TABS: 4.0000 mg | ORAL_TABLET | Freq: Every day | ORAL | Status: DC

## 2016-05-12 MED ORDER — FAMOTIDINE IN NACL 20-0.9 MG/50ML-% IV SOLN
INTRAVENOUS | Status: AC
  Filled 2016-05-12: qty 50

## 2016-05-12 MED ORDER — SODIUM CHLORIDE 0.9% FLUSH
10.0000 mL | INTRAVENOUS | Status: DC | PRN
  Administered 2016-05-12 (×2): 10 mL
  Filled 2016-05-12: qty 10

## 2016-05-12 MED ORDER — HEPARIN SOD (PORK) LOCK FLUSH 100 UNIT/ML IV SOLN
500.0000 [IU] | Freq: Once | INTRAVENOUS | Status: AC | PRN
  Administered 2016-05-12: 500 [IU]
  Filled 2016-05-12: qty 5

## 2016-05-12 MED ORDER — DIPHENHYDRAMINE HCL 50 MG/ML IJ SOLN
25.0000 mg | Freq: Once | INTRAMUSCULAR | Status: AC
  Administered 2016-05-12: 25 mg via INTRAVENOUS

## 2016-05-12 MED FILL — DEXAMETHASONE 4 MG TABLET: 4 | 30 days supply | Qty: 30 | Fill #0

## 2016-05-12 MED FILL — HYDROCOD-APAP 7.5-325/15ML: 7.5-325 | 12 days supply | Qty: 480 | Fill #0

## 2016-05-12 NOTE — Progress Notes (Signed)
VASCULAR LAB PRELIMINARY  PRELIMINARY  PRELIMINARY  PRELIMINARY  Right lower extremity venous duplex completed.    Preliminary report:  Right:  No evidence of DVT, superficial thrombosis, or Baker's cyst.  Narmeen Kerper, RVS 05/12/2016, 3:57 PM

## 2016-05-12 NOTE — Telephone Encounter (Signed)
Pt will get new sched in tx room

## 2016-05-12 NOTE — Patient Instructions (Signed)

## 2016-05-12 NOTE — Patient Instructions (Addendum)
[facility] Discharge Instructions for Patients Receiving Chemotherapy  Today you received the following chemotherapy agents: Cyramza and Taxol.  To help prevent nausea and vomiting after your treatment, we encourage you to take your nausea medication as directed   If you develop nausea and vomiting that is not controlled by your nausea medication, call the clinic.   BELOW ARE SYMPTOMS THAT SHOULD BE REPORTED IMMEDIATELY:  *FEVER GREATER THAN 100.5 F  *CHILLS WITH OR WITHOUT FEVER  NAUSEA AND VOMITING THAT IS NOT CONTROLLED WITH YOUR NAUSEA MEDICATION  *UNUSUAL SHORTNESS OF BREATH  *UNUSUAL BRUISING OR BLEEDING  TENDERNESS IN MOUTH AND THROAT WITH OR WITHOUT PRESENCE OF ULCERS  *URINARY PROBLEMS  *BOWEL PROBLEMS  UNUSUAL RASH Items with * indicate a potential emergency and should be followed up as soon as possible.  Feel free to call the clinic you have any questions or concerns. The clinic phone number is [phone]. Paclitaxel injection What is this medicine? PACLITAXEL (PAK li TAX el) is a chemotherapy drug. It targets fast dividing cells, like cancer cells, and causes these cells to die. This medicine is used to treat ovarian cancer, breast cancer, and other cancers. This medicine may be used for other purposes; ask your health care provider or pharmacist if you have questions. What should I tell my health care provider before I take this medicine? They need to know if you have any of these conditions: -blood disorders -irregular heartbeat -infection (especially a virus infection such as chickenpox, cold sores, or herpes) -liver disease -previous or ongoing radiation therapy -an unusual or allergic reaction to paclitaxel, alcohol, polyoxyethylated castor oil, other chemotherapy agents, other medicines, foods, dyes, or preservatives -pregnant or trying to get pregnant -breast-feeding How should I use this medicine? This drug is given as an infusion  into a vein. It is administered in a hospital or clinic by a specially trained health care professional. Talk to your pediatrician regarding the use of this medicine in children. Special care may be needed. Overdosage: If you think you have taken too much of this medicine contact a poison control center or emergency room at once. NOTE: This medicine is only for you. Do not share this medicine with others. What if I miss a dose? It is important not to miss your dose. Call your doctor or health care professional if you are unable to keep an appointment. What may interact with this medicine? Do not take this medicine with any of the following medications: -disulfiram -metronidazole This medicine may also interact with the following medications: -cyclosporine -diazepam -ketoconazole -medicines to increase blood counts like filgrastim, pegfilgrastim, sargramostim -other chemotherapy drugs like cisplatin, doxorubicin, epirubicin, etoposide, teniposide, vincristine -quinidine -testosterone -vaccines -verapamil Talk to your doctor or health care professional before taking any of these medicines: -acetaminophen -aspirin -ibuprofen -ketoprofen -naproxen This list may not describe all possible interactions. Give your health care provider a list of all the medicines, herbs, non-prescription drugs, or dietary supplements you use. Also tell them if you smoke, drink alcohol, or use illegal drugs. Some items may interact with your medicine. What should I watch for while using this medicine? Your condition will be monitored carefully while you are receiving this medicine. You will need important blood work done while you are taking this medicine. This drug may make you feel generally unwell. This is not uncommon, as chemotherapy can affect healthy cells as well as cancer cells. Report any side effects. Continue your course of treatment even though you feel ill  unless your doctor tells you to stop. This  medicine can cause serious allergic reactions. To reduce your risk you will need to take other medicine(s) before treatment with this medicine. In some cases, you may be given additional medicines to help with side effects. Follow all directions for their use. Call your doctor or health care professional for advice if you get a fever, chills or sore throat, or other symptoms of a cold or flu. Do not treat yourself. This drug decreases your body's ability to fight infections. Try to avoid being around people who are sick. This medicine may increase your risk to bruise or bleed. Call your doctor or health care professional if you notice any unusual bleeding. Be careful brushing and flossing your teeth or using a toothpick because you may get an infection or bleed more easily. If you have any dental work done, tell your dentist you are receiving this medicine. Avoid taking products that contain aspirin, acetaminophen, ibuprofen, naproxen, or ketoprofen unless instructed by your doctor. These medicines may hide a fever. Do not become pregnant while taking this medicine. Women should inform their doctor if they wish to become pregnant or think they might be pregnant. There is a potential for serious side effects to an unborn child. Talk to your health care professional or pharmacist for more information. Do not breast-feed an infant while taking this medicine. Men are advised not to father a child while receiving this medicine. This product may contain alcohol. Ask your pharmacist or healthcare provider if this medicine contains alcohol. Be sure to tell all healthcare providers you are taking this medicine. Certain medicines, like metronidazole and disulfiram, can cause an unpleasant reaction when taken with alcohol. The reaction includes flushing, headache, nausea, vomiting, sweating, and increased thirst. The reaction can last from 30 minutes to several hours. What side effects may I notice from receiving this  medicine? Side effects that you should report to your doctor or health care professional as soon as possible: -allergic reactions like skin rash, itching or hives, swelling of the face, lips, or tongue -low blood counts - This drug may decrease the number of white blood cells, red blood cells and platelets. You may be at increased risk for infections and bleeding. -signs of infection - fever or chills, cough, sore throat, pain or difficulty passing urine -signs of decreased platelets or bleeding - bruising, pinpoint red spots on the skin, black, tarry stools, nosebleeds -signs of decreased red blood cells - unusually weak or tired, fainting spells, lightheadedness -breathing problems -chest pain -high or low blood pressure -mouth sores -nausea and vomiting -pain, swelling, redness or irritation at the injection site -pain, tingling, numbness in the hands or feet -slow or irregular heartbeat -swelling of the ankle, feet, hands Side effects that usually do not require medical attention (report to your doctor or health care professional if they continue or are bothersome): -bone pain -complete hair loss including hair on your head, underarms, pubic hair, eyebrows, and eyelashes -changes in the color of fingernails -diarrhea -loosening of the fingernails -loss of appetite -muscle or joint pain -red flush to skin -sweating This list may not describe all possible side effects. Call your doctor for medical advice about side effects. You may report side effects to FDA at 1-800-FDA-1088. Where should I keep my medicine? This drug is given in a hospital or clinic and will not be stored at home. NOTE: This sheet is a summary. It may not cover all possible information. If you have  questions about this medicine, talk to your doctor, pharmacist, or health care provider.    2016, Elsevier/Gold Standard. (2015-07-15 13:02:56) Ramucirumab injection What is this medicine? RAMUCIRUMAB (ra mue SIR ue  mab) is a monoclonal antibody. It is used to treat stomach cancer, colorectal cancer, or lung cancer. This medicine may be used for other purposes; ask your health care provider or pharmacist if you have questions. What should I tell my health care provider before I take this medicine? They need to know if you have any of these conditions: -bleeding disorders -blood clots -heart disease, including heart failure, heart attack, or chest pain (angina) -high blood pressure -infection (especially a virus infection such as chickenpox, cold sores, or herpes) -protein in your urine -recent surgery -stroke -an unusual or allergic reaction to ramucirumab, other medicines, foods, dyes, or preservatives -pregnant or trying to get pregnant -breast-feeding How should I use this medicine? This medicine is for infusion into a vein. It is given by a health care professional in a hospital or clinic setting. Talk to your pediatrician regarding the use of this medicine in children. Special care may be needed. Overdosage: If you think you have taken too much of this medicine contact a poison control center or emergency room at once. NOTE: This medicine is only for you. Do not share this medicine with others. What if I miss a dose? It is important not to miss your dose. Call your doctor or health care professional if you are unable to keep an appointment. What may interact with this medicine? Interactions have not been studied. This list may not describe all possible interactions. Give your health care provider a list of all the medicines, herbs, non-prescription drugs, or dietary supplements you use. Also tell them if you smoke, drink alcohol, or use illegal drugs. Some items may interact with your medicine. What should I watch for while using this medicine? Your condition will be monitored carefully while you are receiving this medicine. You will need to to check your blood pressure and have your blood and  urine tested while you are taking this medicine. Your condition will be monitored carefully while you are receiving this medicine. This medicine may increase your risk to bruise or bleed. Call your doctor or health care professional if you notice any unusual bleeding. This medicine may rarely cause 'gastrointestinal perforation' (holes in the stomach, intestines or colon), a serious side effect requiring surgery to repair. This medicine should be started at least 28 days following major surgery and the site of the surgery should be totally healed. Check with your doctor before scheduling dental work or surgery while you are receiving this treatment. Talk to your doctor if you have recently had surgery or if you have a wound that has not healed. Do not become pregnant while taking this medicine or for 3 months after stopping it. Women should inform their doctor if they wish to become pregnant or think they might be pregnant. There is a potential for serious side effects to an unborn child. Talk to your health care professional or pharmacist for more information. What side effects may I notice from receiving this medicine? Side effects that you should report to your doctor or health care professional as soon as possible: -allergic reactions like skin rash, itching or hives, breathing problems, swelling of the face, lips, or tongue -signs of infection - fever or chills, cough, sore throat -chest pain or chest tightness -confusion -dizziness -feeling faint or lightheaded, falls -severe abdominal pain -  severe nausea, vomiting -signs and symptoms of bleeding such as bloody or black, tarry stools; red or dark-brown urine; spitting up blood or brown material that looks like coffee grounds; red spots on the skin; unusual bruising or bleeding from the eye, gums, or nose -signs and symptoms of a blood clot such as breathing problems; changes in vision; chest pain; severe, sudden headache; pain, swelling,  warmth in the leg; trouble speaking; sudden numbness or weakness of the face, arm or leg -symptoms of a stroke: change in mental awareness, inability to talk or move one side of the body -trouble walking, dizziness, loss of balance or coordination Side effects that usually do not require medical attention (Report these to your doctor or health care professional if they continue or are bothersome.): -cold, clammy skin -constipation -diarrhea -headache -nausea, vomiting -stomach pain -unusually slow heartbeat -unusually weak or tired This list may not describe all possible side effects. Call your doctor for medical advice about side effects. You may report side effects to FDA at 1-800-FDA-1088. Where should I keep my medicine? This drug is given in a hospital or clinic and will not be stored at home. NOTE: This sheet is a summary. It may not cover all possible information. If you have questions about this medicine, talk to your doctor, pharmacist, or health care provider.    2016, Elsevier/Gold Standard. (2015-01-26 17:37:19)

## 2016-05-12 NOTE — Progress Notes (Signed)
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 He has anorexia and a limited activity level. He is ambulatory in the home. Dr. Verline Lema would like to proceed with chemotherapy today.   Objective:  Vital signs in last 24 hours:  Blood pressure 95/62, pulse 114, temperature 97.7 F (36.5 C), temperature source Oral, resp. rate 18, height 6' (1.829 m), weight 172 lb 3.2 oz (78.109 kg), SpO2 99 %.   Resp:  lungs clear bilaterally Cardio:  regular rate and rhythm GI:  no hepatomegaly, nontender, healed surgical incision Vascular:  trace edema at the right lower leg   Portacath/PICC-without erythema  Lab Results:  Lab Results  Component Value Date   WBC 5.4 05/12/2016   HGB 12.5* 05/12/2016   HCT 39.1 05/12/2016   MCV 84.6 05/12/2016   PLT 270 05/12/2016   NEUTROABS 4.3 05/12/2016    Lab Results  Component Value Date   CEA1 470.6* 02/25/2016    Imaging:  Dg Chest 2 View  05/09/2016  CLINICAL DATA:  Chest pain.  Pericardial window. EXAM: CHEST  2 VIEW COMPARISON:  05/01/2016 .  CT 05/01/2016. FINDINGS: Tracheostomy tube in stable position. PowerPort catheter stable position. Right perihilar mass and or infiltrate is unchanged. Low lung volumes with bibasilar atelectasis. No pleural effusion or pneumothorax. IMPRESSION: 1.  Tracheostomy tube prior poor catheter stable position. 2. Stable right perihilar mass and or infiltrate. Stable bibasilar atelectasis. Electronically Signed   By: Marcello Moores  Register   On: 05/09/2016 13:47    Medications: I have reviewed the patient's current medications.  Assessment/Plan: 1.Adenocarcinoma of the distal esophagus/gastric cardia-status post an endoscopic biopsy on 03/18/2013 confirming adenocarcinoma, HER-2/neu not amplified  -Staging CT scans 03/20/2013 confirmed a distal esophageal/gastric cardia mass, distal  paraesophageal lymph nodes and paratracheal nodes concerning for metastatic lymphadenopathy  -A PET scan 03/27/2013 with a multifocal area of hypermetabolic activity involving the thoracic esophagus extending into the gastric cardia and hypermetabolic mediastinal nodes, no distant metastatic disease  -Initiation of radiation 04/07/2013 and weekly Taxol/carboplatin 04/08/2013, radiation completed 05/15/2013  -Restaging PET scan 06/09/2013-no evidence of metastatic disease, no hypermetabolic lymphadenopathy  -Esophagogastrectomy 07/07/2013 revealed a ypT1b,ypN0 tumor with negative surgical margins  -CT neck 06/09/2014 revealed a necrotic lymph node in the right tracheoesophageal groove  -PET scan 11/15/2014 revealed a single focus of hypermetabolism corresponding to the necrotic right upper mediastinum lymph node -EUS biopsy of the paraesophageal lymph node 06/25/2012 confirmed metastatic adenocarcinoma  -Radiation completed 08/11/2014 -Rising CEA May 2016 -PET scan 07/13/2015 with hypermetabolic right paratracheal and right hilar adenopathy -Cycle 1 FOLFOX 08/03/2015 -Cycle 2 FOLFOX 08/17/2015 - cycle 3 FOLFOX 08/31/2015 (oxaliplatin dose reduced secondary to thrombocytopenia)  -Cycle 4 FOLFOX 09/14/2015 -Cycle 5 FOLFOX 09/28/2015 - restaging CTs 09/30/2015 with stable disease involving neck/chest lymph nodes, a slight increase in size of left upper lobe lung nodule  - cycle 6 FOLFOX 10/12/2015  -FOLFOX discontinued secondary to development of bilateral vocal cord paralysis November 2016 - CT scan of the neck and chest on 11/22/15 revealed no definite disease in the neck. At the thoracic inlet region, there is questionably slight increase in prominence of soft tissue at the right tracheoesophageal groove. This could be an enlarging node or mass but could also relate to the esophagectomy and pull-through surgery.Enlarging lingular and left lower lobe nodules are worrisome for metastatic  disease. Peribronchovascular nodular consolidation in the anterior right lower lobe, slightly  more prominent, indeterminate. Cholelithiasis. Left diaphragmatic hernia, stable. -Xeloda 1 week on/1 week off beginning 12/11/2015 -Restaging CTs of the neck and chest 02/07/2016 revealed enlargement of 2 left-sided lung nodules, no other evidence of disease progression -Cycle 1 Pembrolizumab 02/25/2016  -Cycle 2 Pembrolizumab 03/17/2016 -Cycle 3 Pembrolizumab 04/07/2016   -CT 04/20/2016 consistent with progressive disease involving a large pericardial effusion, bilateral pleural effusions, enlarged mediastinal lymph nodes and lung nodules  Cytology from the pericardial fluid 04/21/2016-positive for metastatic adenocarcinoma -CT 05/01/2016 was slight worsening of right hilar lymphadenopathy with near complete collapse of the right middle lobe, nodular metastases in the left lung, stable bilateral pleural effusions  Cycle 1 Taxol/ramucirumab 05/12/2016   2. Indeterminate 15 mm right liver lesion on the CT scan 03/20/2013, MRI of the liver 06/09/2013 confirmed a right liver hemangioma  3. Anorexia/weight loss and solid dysphagia secondary to #1 -improved 4. History of a colon polyp, status post a polypectomy October 2013  5. history of Thrombocytopenia secondary chemotherapy-the carboplatin wase dose reduced with week 5 of chemotherapy chemotherapy  6. hoarseness secondary to right vocal cord paralysis-CT/PET scan imaging consistent with a malignant right paratracheal lymph node causing the vocal cord paralysis  -Status post a laryngeal plasty at [facility] on 09/02/2014 7. Esophageal stricture-status post dilation 06/08/2015 -Food impaction removal 09/29/2015 -Esophageal stricture dilation 10/06/2015 8. Port-A-Cath placement 07/20/2015 9. Delayed nausea following cycle 1 FOLFOX. Aloxi and Emend added with cycle 2. 10. Thrombocytopenia secondary to chemotherapy  11. Emotional  lability/depression-prophylactic Decadron discontinued; Remeron initiated 12. Progressive hoarseness November 2016, diagnosed with bilateral vocal cord paralysis, status post a tracheostomy and bilateral injection laryngoplasty at [facility] on 10/27/2015 13. Pericardial window 04/21/2016    Disposition:   He appears unchanged over the past week. He would like to proceed with chemotherapy today. We again reviewed the potential toxicities associated with the Taxol/ramucirumab regimen.  We will check a Doppler of the right leg to rule out a DVT. He will begin a trial of low-dose Decadron as an appetite stimulant. We will contact Dr. Henrene Pastor for evaluation of the dysphagia.  Dr. Verline Lema will return for an office visit in one week. He will contact us in the interim for new symptoms.  Betsy Coder, MD  05/12/2016  11:59 AM

## 2016-05-12 NOTE — Telephone Encounter (Signed)
Received call report: Pt is negative for DVT. Dr. Benay Spice made aware. Pt transported back to infusion via wheelchair for remainder of his chemo.

## 2016-05-13 LAB — CEA: CEA: 1951 ng/mL — ABNORMAL HIGH (ref 0.0–4.7)

## 2016-05-14 ENCOUNTER — Other Ambulatory Visit: Payer: Self-pay | Admitting: Oncology

## 2016-05-15 ENCOUNTER — Ambulatory Visit: Payer: BLUE CROSS/BLUE SHIELD | Admitting: Cardiology

## 2016-05-16 ENCOUNTER — Other Ambulatory Visit: Payer: Self-pay

## 2016-05-16 ENCOUNTER — Telehealth: Payer: Self-pay

## 2016-05-16 DIAGNOSIS — R1310 Dysphagia, unspecified: Secondary | ICD-10-CM

## 2016-05-16 NOTE — Telephone Encounter (Signed)
Pt scheduled at [facility] 06/05/16'@1'$ :45pm. Pt to arrive there at 12:30pm. Pt to be NPO after midnight. Pts wife aware of appt.

## 2016-05-16 NOTE — Telephone Encounter (Signed)
Noted! Thank you

## 2016-05-16 NOTE — Telephone Encounter (Signed)
-----   Message from Irene Shipper, MD sent at 05/16/2016 12:41 PM EDT ----- How about Monday, June 26 at [facility] hospital to follow my 1 PM case. This patient will need MAC/propofol as well (for EGD with balloon dilation of the esophagus). ----- Message -----    From: Algernon Huxley, RN    Sent: 05/15/2016   8:44 AM      To: Irene Shipper, MD  Please see note below and advise. He is currently scheduled 07/04/16 @ [facility], pt could not do earlier July appt during your hospital week.  ----- Message -----    From: Tania Ade, RN    Sent: 05/12/2016   5:12 PM      To: Algernon Huxley, RN  Vaughan Basta,  Dr. Benay Spice is requesting Dr. Henrene Pastor get him in much sooner than July if possible. Says he is not swallowing well at all and he may not even be alive the end of July and would like his remaining time to be of better quality. Whatever you can do to help is appreciated.  Manuela Schwartz

## 2016-05-19 ENCOUNTER — Ambulatory Visit: Payer: BLUE CROSS/BLUE SHIELD

## 2016-05-19 ENCOUNTER — Ambulatory Visit (HOSPITAL_BASED_OUTPATIENT_CLINIC_OR_DEPARTMENT_OTHER): Payer: BLUE CROSS/BLUE SHIELD | Admitting: Oncology

## 2016-05-19 ENCOUNTER — Ambulatory Visit (HOSPITAL_BASED_OUTPATIENT_CLINIC_OR_DEPARTMENT_OTHER): Payer: BLUE CROSS/BLUE SHIELD

## 2016-05-19 ENCOUNTER — Other Ambulatory Visit (HOSPITAL_BASED_OUTPATIENT_CLINIC_OR_DEPARTMENT_OTHER): Payer: BLUE CROSS/BLUE SHIELD

## 2016-05-19 VITALS — BP 103/67 | HR 69 | Temp 98.6°F | Resp 17 | Wt 173.8 lb

## 2016-05-19 DIAGNOSIS — R630 Anorexia: Secondary | ICD-10-CM | POA: Diagnosis not present

## 2016-05-19 DIAGNOSIS — Z5111 Encounter for antineoplastic chemotherapy: Secondary | ICD-10-CM

## 2016-05-19 DIAGNOSIS — R634 Abnormal weight loss: Secondary | ICD-10-CM

## 2016-05-19 DIAGNOSIS — C155 Malignant neoplasm of lower third of esophagus: Secondary | ICD-10-CM

## 2016-05-19 DIAGNOSIS — R1310 Dysphagia, unspecified: Secondary | ICD-10-CM | POA: Diagnosis not present

## 2016-05-19 DIAGNOSIS — D696 Thrombocytopenia, unspecified: Secondary | ICD-10-CM

## 2016-05-19 DIAGNOSIS — Z95828 Presence of other vascular implants and grafts: Secondary | ICD-10-CM

## 2016-05-19 LAB — CBC WITH DIFFERENTIAL/PLATELET
BASO%: 0.2 % (ref 0.0–2.0)
Basophils Absolute: 0 10*3/uL (ref 0.0–0.1)
EOS ABS: 0.1 10*3/uL (ref 0.0–0.5)
EOS%: 1.3 % (ref 0.0–7.0)
HCT: 38 % — ABNORMAL LOW (ref 38.4–49.9)
HEMOGLOBIN: 12.2 g/dL — AB (ref 13.0–17.1)
LYMPH%: 13.9 % — AB (ref 14.0–49.0)
MCH: 27.4 pg (ref 27.2–33.4)
MCHC: 32.1 g/dL (ref 32.0–36.0)
MCV: 85.2 fL (ref 79.3–98.0)
MONO#: 0.2 10*3/uL (ref 0.1–0.9)
MONO%: 3.8 % (ref 0.0–14.0)
NEUT%: 80.8 % — ABNORMAL HIGH (ref 39.0–75.0)
NEUTROS ABS: 4.5 10*3/uL (ref 1.5–6.5)
Platelets: 203 10*3/uL (ref 140–400)
RBC: 4.46 10*6/uL (ref 4.20–5.82)
RDW: 14.3 % (ref 11.0–14.6)
WBC: 5.5 10*3/uL (ref 4.0–10.3)
lymph#: 0.8 10*3/uL — ABNORMAL LOW (ref 0.9–3.3)

## 2016-05-19 MED ORDER — DEXAMETHASONE SODIUM PHOSPHATE 100 MG/10ML IJ SOLN
10.0000 mg | Freq: Once | INTRAMUSCULAR | Status: AC
  Administered 2016-05-19: 10 mg via INTRAVENOUS
  Filled 2016-05-19: qty 1

## 2016-05-19 MED ORDER — HEPARIN SOD (PORK) LOCK FLUSH 100 UNIT/ML IV SOLN
500.0000 [IU] | Freq: Once | INTRAVENOUS | Status: AC | PRN
  Administered 2016-05-19: 500 [IU]
  Filled 2016-05-19: qty 5

## 2016-05-19 MED ORDER — FAMOTIDINE IN NACL 20-0.9 MG/50ML-% IV SOLN
20.0000 mg | Freq: Once | INTRAVENOUS | Status: AC
  Administered 2016-05-19: 20 mg via INTRAVENOUS

## 2016-05-19 MED ORDER — DIPHENHYDRAMINE HCL 50 MG/ML IJ SOLN
25.0000 mg | Freq: Once | INTRAMUSCULAR | Status: AC
Start: 2016-05-19 — End: 2016-05-19
  Administered 2016-05-19: 25 mg via INTRAVENOUS

## 2016-05-19 MED ORDER — SODIUM CHLORIDE 0.9 % IJ SOLN
10.0000 mL | INTRAMUSCULAR | Status: DC | PRN
  Administered 2016-05-19: 10 mL via INTRAVENOUS
  Filled 2016-05-19: qty 10

## 2016-05-19 MED ORDER — SODIUM CHLORIDE 0.9% FLUSH
10.0000 mL | INTRAVENOUS | Status: DC | PRN
  Administered 2016-05-19: 10 mL
  Filled 2016-05-19: qty 10

## 2016-05-19 MED ORDER — FAMOTIDINE IN NACL 20-0.9 MG/50ML-% IV SOLN
INTRAVENOUS | Status: AC
  Filled 2016-05-19: qty 50

## 2016-05-19 MED ORDER — DEXAMETHASONE 4 MG PO TABS: 2.0000 mg | ORAL_TABLET | Freq: Every day | ORAL | Status: DC

## 2016-05-19 MED ORDER — SODIUM CHLORIDE 0.9 % IV SOLN
80.0000 mg/m2 | Freq: Once | INTRAVENOUS | Status: AC
  Administered 2016-05-19: 162 mg via INTRAVENOUS
  Filled 2016-05-19: qty 27

## 2016-05-19 MED ORDER — DIPHENHYDRAMINE HCL 50 MG/ML IJ SOLN
INTRAMUSCULAR | Status: AC
  Filled 2016-05-19: qty 1

## 2016-05-19 MED ORDER — SODIUM CHLORIDE 0.9 % IV SOLN
Freq: Once | INTRAVENOUS | Status: AC
  Administered 2016-05-19: 10:00:00 via INTRAVENOUS

## 2016-05-19 NOTE — Patient Instructions (Signed)
Paclitaxel injection What is this medicine? PACLITAXEL (PAK li TAX el) is a chemotherapy drug. It targets fast dividing cells, like cancer cells, and causes these cells to die. This medicine is used to treat ovarian cancer, breast cancer, and other cancers. This medicine may be used for other purposes; ask your health care provider or pharmacist if you have questions. What should I tell my health care provider before I take this medicine? They need to know if you have any of these conditions: -blood disorders -irregular heartbeat -infection (especially a virus infection such as chickenpox, cold sores, or herpes) -liver disease -previous or ongoing radiation therapy -an unusual or allergic reaction to paclitaxel, alcohol, polyoxyethylated castor oil, other chemotherapy agents, other medicines, foods, dyes, or preservatives -pregnant or trying to get pregnant -breast-feeding How should I use this medicine? This drug is given as an infusion into a vein. It is administered in a hospital or clinic by a specially trained health care professional. Talk to your pediatrician regarding the use of this medicine in children. Special care may be needed. Overdosage: If you think you have taken too much of this medicine contact a poison control center or emergency room at once. NOTE: This medicine is only for you. Do not share this medicine with others. What if I miss a dose? It is important not to miss your dose. Call your doctor or health care professional if you are unable to keep an appointment. What may interact with this medicine? Do not take this medicine with any of the following medications: -disulfiram -metronidazole This medicine may also interact with the following medications: -cyclosporine -diazepam -ketoconazole -medicines to increase blood counts like filgrastim, pegfilgrastim, sargramostim -other chemotherapy drugs like cisplatin, doxorubicin, epirubicin, etoposide, teniposide,  vincristine -quinidine -testosterone -vaccines -verapamil Talk to your doctor or health care professional before taking any of these medicines: -acetaminophen -aspirin -ibuprofen -ketoprofen -naproxen This list may not describe all possible interactions. Give your health care provider a list of all the medicines, herbs, non-prescription drugs, or dietary supplements you use. Also tell them if you smoke, drink alcohol, or use illegal drugs. Some items may interact with your medicine. What should I watch for while using this medicine? Your condition will be monitored carefully while you are receiving this medicine. You will need important blood work done while you are taking this medicine. This drug may make you feel generally unwell. This is not uncommon, as chemotherapy can affect healthy cells as well as cancer cells. Report any side effects. Continue your course of treatment even though you feel ill unless your doctor tells you to stop. This medicine can cause serious allergic reactions. To reduce your risk you will need to take other medicine(s) before treatment with this medicine. In some cases, you may be given additional medicines to help with side effects. Follow all directions for their use. Call your doctor or health care professional for advice if you get a fever, chills or sore throat, or other symptoms of a cold or flu. Do not treat yourself. This drug decreases your body's ability to fight infections. Try to avoid being around people who are sick. This medicine may increase your risk to bruise or bleed. Call your doctor or health care professional if you notice any unusual bleeding. Be careful brushing and flossing your teeth or using a toothpick because you may get an infection or bleed more easily. If you have any dental work done, tell your dentist you are receiving this medicine. Avoid taking products that   contain aspirin, acetaminophen, ibuprofen, naproxen, or ketoprofen unless  instructed by your doctor. These medicines may hide a fever. Do not become pregnant while taking this medicine. Women should inform their doctor if they wish to become pregnant or think they might be pregnant. There is a potential for serious side effects to an unborn child. Talk to your health care professional or pharmacist for more information. Do not breast-feed an infant while taking this medicine. Men are advised not to father a child while receiving this medicine. This product may contain alcohol. Ask your pharmacist or healthcare provider if this medicine contains alcohol. Be sure to tell all healthcare providers you are taking this medicine. Certain medicines, like metronidazole and disulfiram, can cause an unpleasant reaction when taken with alcohol. The reaction includes flushing, headache, nausea, vomiting, sweating, and increased thirst. The reaction can last from 30 minutes to several hours. What side effects may I notice from receiving this medicine? Side effects that you should report to your doctor or health care professional as soon as possible: -allergic reactions like skin rash, itching or hives, swelling of the face, lips, or tongue -low blood counts - This drug may decrease the number of white blood cells, red blood cells and platelets. You may be at increased risk for infections and bleeding. -signs of infection - fever or chills, cough, sore throat, pain or difficulty passing urine -signs of decreased platelets or bleeding - bruising, pinpoint red spots on the skin, black, tarry stools, nosebleeds -signs of decreased red blood cells - unusually weak or tired, fainting spells, lightheadedness -breathing problems -chest pain -high or low blood pressure -mouth sores -nausea and vomiting -pain, swelling, redness or irritation at the injection site -pain, tingling, numbness in the hands or feet -slow or irregular heartbeat -swelling of the ankle, feet, hands Side effects that  usually do not require medical attention (report to your doctor or health care professional if they continue or are bothersome): -bone pain -complete hair loss including hair on your head, underarms, pubic hair, eyebrows, and eyelashes -changes in the color of fingernails -diarrhea -loosening of the fingernails -loss of appetite -muscle or joint pain -red flush to skin -sweating This list may not describe all possible side effects. Call your doctor for medical advice about side effects. You may report side effects to FDA at 1-800-FDA-1088. Where should I keep my medicine? This drug is given in a hospital or clinic and will not be stored at home. NOTE: This sheet is a summary. It may not cover all possible information. If you have questions about this medicine, talk to your doctor, pharmacist, or health care provider.    2016, [employer]. (2015-07-15 13:02:56)  

## 2016-05-19 NOTE — Progress Notes (Signed)
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 He tolerated the chemotherapy well. No new complaint. The cough has improved. He has an improved energy level and appetite.   Objective:  Vital signs in last 24 hours:  Blood pressure 103/67, pulse 69, temperature 98.6 F (37 C), temperature source Oral, resp. rate 17, weight 173 lb 12.8 oz (78.835 kg), SpO2 100 %.    HEENT:  whitecoat over the tongue, no buccal thrush Resp:  lungs clear bilaterally Cardio:  regular rate and rhythm GI:  no hepatomegaly Vascular:  no leg edema  Portacath/PICC-without erythema  Lab Results:  Lab Results  Component Value Date   WBC 5.5 05/19/2016   HGB 12.2* 05/19/2016   HCT 38.0* 05/19/2016   MCV 85.2 05/19/2016   PLT 203 05/19/2016   NEUTROABS 4.5 05/19/2016      Lab Results  Component Value Date   CEA1 1,951.0* 05/12/2016     Medications: I have reviewed the patient's current medications.  Assessment/Plan: 1. Adenocarcinoma of the distal esophagus/gastric cardia-status post an endoscopic biopsy on 03/18/2013 confirming adenocarcinoma, HER-2/neu not amplified  -Staging CT scans 03/20/2013 confirmed a distal esophageal/gastric cardia mass, distal paraesophageal lymph nodes and paratracheal nodes concerning for metastatic lymphadenopathy  -A PET scan 03/27/2013 with a multifocal area of hypermetabolic activity involving the thoracic esophagus extending into the gastric cardia and hypermetabolic mediastinal nodes, no distant metastatic disease  -Initiation of radiation 04/07/2013 and weekly Taxol/carboplatin 04/08/2013, radiation completed 05/15/2013  -Restaging PET scan 06/09/2013-no evidence of metastatic disease, no hypermetabolic lymphadenopathy  -Esophagogastrectomy 07/07/2013 revealed a ypT1b,ypN0 tumor with negative  surgical margins  -CT neck 06/09/2014 revealed a necrotic lymph node in the right tracheoesophageal groove  -PET scan 11/15/2014 revealed a single focus of hypermetabolism corresponding to the necrotic right upper mediastinum lymph node -EUS biopsy of the paraesophageal lymph node 06/25/2012 confirmed metastatic adenocarcinoma  -Radiation completed 08/11/2014 -Rising CEA May 2016 -PET scan 07/13/2015 with hypermetabolic right paratracheal and right hilar adenopathy -Cycle 1 FOLFOX 08/03/2015 -Cycle 2 FOLFOX 08/17/2015 - cycle 3 FOLFOX 08/31/2015 (oxaliplatin dose reduced secondary to thrombocytopenia)  -Cycle 4 FOLFOX 09/14/2015 -Cycle 5 FOLFOX 09/28/2015 - restaging CTs 09/30/2015 with stable disease involving neck/chest lymph nodes, a slight increase in size of left upper lobe lung nodule  - cycle 6 FOLFOX 10/12/2015  -FOLFOX discontinued secondary to development of bilateral vocal cord paralysis November 2016 - CT scan of the neck and chest on 11/22/15 revealed no definite disease in the neck. At the thoracic inlet region, there is questionably slight increase in prominence of soft tissue at the right tracheoesophageal groove. This could be an enlarging node or mass but could also relate to the esophagectomy and pull-through surgery.Enlarging lingular and left lower lobe nodules are worrisome for metastatic disease. Peribronchovascular nodular consolidation in the anterior right lower lobe, slightly more prominent, indeterminate. Cholelithiasis. Left diaphragmatic hernia, stable. -Xeloda 1 week on/1 week off beginning 12/11/2015 -Restaging CTs of the neck and chest 02/07/2016 revealed enlargement of 2 left-sided lung nodules, no other evidence of disease progression -Cycle 1 Pembrolizumab 02/25/2016  -Cycle 2 Pembrolizumab 03/17/2016 -Cycle 3 Pembrolizumab 04/07/2016   -CT 04/20/2016 consistent with progressive disease involving a large pericardial effusion, bilateral pleural  effusions, enlarged mediastinal lymph nodes and lung nodules  Cytology from the pericardial fluid 04/21/2016-positive for metastatic adenocarcinoma -CT 05/01/2016 was slight worsening of right hilar lymphadenopathy with near  complete collapse of the right middle lobe, nodular metastases in the left lung, stable bilateral pleural effusions  Cycle 1 Taxol/ramucirumab 05/12/2016  2. Indeterminate 15 mm right liver lesion on the CT scan 03/20/2013, MRI of the liver 06/09/2013 confirmed a right liver hemangioma  3. Anorexia/weight loss and solid dysphagia secondary to #1 -improved 4. History of a colon polyp, status post a polypectomy October 2013  5. history of Thrombocytopenia secondary chemotherapy-the carboplatin wase dose reduced with week 5 of chemotherapy chemotherapy  6. hoarseness secondary to right vocal cord paralysis-CT/PET scan imaging consistent with a malignant right paratracheal lymph node causing the vocal cord paralysis  -Status post a laryngeal plasty at [facility] on 09/02/2014 7. Esophageal stricture-status post dilation 06/08/2015 -Food impaction removal 09/29/2015 -Esophageal stricture dilation 10/06/2015 8. Port-A-Cath placement 07/20/2015 9. Delayed nausea following cycle 1 FOLFOX. Aloxi and Emend added with cycle 2. 10. Thrombocytopenia secondary to chemotherapy  11. Emotional lability/depression-prophylactic Decadron discontinued; Remeron initiated 12. Progressive hoarseness November 2016, diagnosed with bilateral vocal cord paralysis, status post a tracheostomy and bilateral injection laryngoplasty at [facility] on 10/27/2015 13. 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 He will receive Taxol today. He will return for an office visit and chemotherapy in one week.  We tapered the Decadron to 2 mg daily. Betsy Coder, MD  05/19/2016  9:56 AM

## 2016-05-19 NOTE — Addendum Note (Signed)
Addended by: Brien Few on: 05/19/2016 10:13 AM   Modules accepted: Orders

## 2016-05-19 NOTE — Patient Instructions (Signed)

## 2016-05-21 ENCOUNTER — Other Ambulatory Visit: Payer: Self-pay | Admitting: Oncology

## 2016-05-26 ENCOUNTER — Other Ambulatory Visit (HOSPITAL_BASED_OUTPATIENT_CLINIC_OR_DEPARTMENT_OTHER): Payer: BLUE CROSS/BLUE SHIELD

## 2016-05-26 ENCOUNTER — Ambulatory Visit (HOSPITAL_BASED_OUTPATIENT_CLINIC_OR_DEPARTMENT_OTHER): Payer: BLUE CROSS/BLUE SHIELD | Admitting: Nurse Practitioner

## 2016-05-26 ENCOUNTER — Ambulatory Visit (HOSPITAL_BASED_OUTPATIENT_CLINIC_OR_DEPARTMENT_OTHER): Payer: BLUE CROSS/BLUE SHIELD

## 2016-05-26 ENCOUNTER — Ambulatory Visit: Payer: BLUE CROSS/BLUE SHIELD

## 2016-05-26 ENCOUNTER — Other Ambulatory Visit: Payer: BLUE CROSS/BLUE SHIELD

## 2016-05-26 VITALS — BP 102/61 | HR 91 | Temp 97.0°F | Resp 20 | Ht 72.0 in | Wt 177.6 lb

## 2016-05-26 DIAGNOSIS — Z5112 Encounter for antineoplastic immunotherapy: Secondary | ICD-10-CM

## 2016-05-26 DIAGNOSIS — Z5111 Encounter for antineoplastic chemotherapy: Secondary | ICD-10-CM | POA: Diagnosis not present

## 2016-05-26 DIAGNOSIS — C155 Malignant neoplasm of lower third of esophagus: Secondary | ICD-10-CM | POA: Diagnosis not present

## 2016-05-26 DIAGNOSIS — R300 Dysuria: Secondary | ICD-10-CM

## 2016-05-26 DIAGNOSIS — Z95828 Presence of other vascular implants and grafts: Secondary | ICD-10-CM

## 2016-05-26 DIAGNOSIS — G620 Drug-induced polyneuropathy: Secondary | ICD-10-CM

## 2016-05-26 LAB — CBC WITH DIFFERENTIAL/PLATELET
BASO%: 0.3 % (ref 0.0–2.0)
Basophils Absolute: 0 10*3/uL (ref 0.0–0.1)
EOS%: 2.5 % (ref 0.0–7.0)
Eosinophils Absolute: 0.1 10*3/uL (ref 0.0–0.5)
HEMATOCRIT: 36.1 % — AB (ref 38.4–49.9)
HEMOGLOBIN: 11.3 g/dL — AB (ref 13.0–17.1)
LYMPH#: 0.6 10*3/uL — AB (ref 0.9–3.3)
LYMPH%: 21.2 % (ref 14.0–49.0)
MCH: 26.7 pg — ABNORMAL LOW (ref 27.2–33.4)
MCHC: 31.3 g/dL — ABNORMAL LOW (ref 32.0–36.0)
MCV: 85.2 fL (ref 79.3–98.0)
MONO#: 0.3 10*3/uL (ref 0.1–0.9)
MONO%: 11.5 % (ref 0.0–14.0)
NEUT%: 64.5 % (ref 39.0–75.0)
NEUTROS ABS: 1.7 10*3/uL (ref 1.5–6.5)
PLATELETS: 152 10*3/uL (ref 140–400)
RBC: 4.23 10*6/uL (ref 4.20–5.82)
RDW: 15.7 % — ABNORMAL HIGH (ref 11.0–14.6)
WBC: 2.6 10*3/uL — AB (ref 4.0–10.3)

## 2016-05-26 LAB — URINALYSIS, MICROSCOPIC - CHCC
BILIRUBIN (URINE): NEGATIVE
Blood: NEGATIVE
Glucose: NEGATIVE mg/dL
Ketones: NEGATIVE mg/dL
Leukocyte Esterase: NEGATIVE
NITRITE: NEGATIVE
PH: 6 (ref 4.6–8.0)
Protein: NEGATIVE mg/dL
Specific Gravity, Urine: 1.02 (ref 1.003–1.035)
Urobilinogen, UR: 0.2 mg/dL (ref 0.2–1)

## 2016-05-26 MED ORDER — FAMOTIDINE IN NACL 20-0.9 MG/50ML-% IV SOLN
20.0000 mg | Freq: Once | INTRAVENOUS | Status: AC
  Administered 2016-05-26: 20 mg via INTRAVENOUS

## 2016-05-26 MED ORDER — SODIUM CHLORIDE 0.9 % IJ SOLN
10.0000 mL | INTRAMUSCULAR | Status: DC | PRN
Start: 2016-05-26 — End: 2016-05-26
  Administered 2016-05-26: 10 mL via INTRAVENOUS
  Filled 2016-05-26: qty 10

## 2016-05-26 MED ORDER — ACETAMINOPHEN 160 MG/5ML PO SOLN
650.0000 mg | Freq: Once | ORAL | Status: AC
  Administered 2016-05-26: 650 mg via ORAL
  Filled 2016-05-26: qty 20.3

## 2016-05-26 MED ORDER — RAMUCIRUMAB CHEMO INJECTION 500 MG/50ML
8.0000 mg/kg | Freq: Once | INTRAVENOUS | Status: AC
  Administered 2016-05-26: 600 mg via INTRAVENOUS
  Filled 2016-05-26: qty 50

## 2016-05-26 MED ORDER — DIPHENHYDRAMINE HCL 50 MG/ML IJ SOLN
INTRAMUSCULAR | Status: AC
  Filled 2016-05-26: qty 1

## 2016-05-26 MED ORDER — HEPARIN SOD (PORK) LOCK FLUSH 100 UNIT/ML IV SOLN
500.0000 [IU] | Freq: Once | INTRAVENOUS | Status: AC | PRN
  Administered 2016-05-26: 500 [IU]
  Filled 2016-05-26: qty 5

## 2016-05-26 MED ORDER — SODIUM CHLORIDE 0.9 % IV SOLN
10.0000 mg | Freq: Once | INTRAVENOUS | Status: AC
  Administered 2016-05-26: 10 mg via INTRAVENOUS
  Filled 2016-05-26: qty 1

## 2016-05-26 MED ORDER — ACETAMINOPHEN 325 MG PO TABS
ORAL_TABLET | ORAL | Status: AC
  Filled 2016-05-26: qty 2

## 2016-05-26 MED ORDER — PACLITAXEL CHEMO INJECTION 300 MG/50ML
80.0000 mg/m2 | Freq: Once | INTRAVENOUS | Status: AC
  Administered 2016-05-26: 162 mg via INTRAVENOUS
  Filled 2016-05-26: qty 27

## 2016-05-26 MED ORDER — DIPHENHYDRAMINE HCL 50 MG/ML IJ SOLN
25.0000 mg | Freq: Once | INTRAMUSCULAR | Status: AC
  Administered 2016-05-26: 25 mg via INTRAVENOUS

## 2016-05-26 MED ORDER — SODIUM CHLORIDE 0.9 % IV SOLN
Freq: Once | INTRAVENOUS | Status: AC
  Administered 2016-05-26: 15:00:00 via INTRAVENOUS

## 2016-05-26 MED ORDER — HYDROCODONE-ACETAMINOPHEN 7.5-325 MG/15ML PO SOLN: 10.0000 mL | Freq: Four times a day (QID) | ORAL | Status: DC | PRN

## 2016-05-26 MED ORDER — FAMOTIDINE IN NACL 20-0.9 MG/50ML-% IV SOLN
INTRAVENOUS | Status: AC
  Filled 2016-05-26: qty 50

## 2016-05-26 MED ORDER — SODIUM CHLORIDE 0.9% FLUSH
10.0000 mL | INTRAVENOUS | Status: DC | PRN
Start: 2016-05-26 — End: 2016-05-26
  Administered 2016-05-26: 10 mL
  Filled 2016-05-26: qty 10

## 2016-05-26 NOTE — Progress Notes (Signed)
OK to tx without CMET per Dr. Benay Spice.

## 2016-05-26 NOTE — Patient Instructions (Signed)

## 2016-05-26 NOTE — Patient Instructions (Signed)
[facility] Discharge Instructions for Patients Receiving Chemotherapy  Today you received the following chemotherapy agents: Cyramza and Taxol.  To help prevent nausea and vomiting after your treatment, we encourage you to take your nausea medication as directed   If you develop nausea and vomiting that is not controlled by your nausea medication, call the clinic.   BELOW ARE SYMPTOMS THAT SHOULD BE REPORTED IMMEDIATELY:  *FEVER GREATER THAN 100.5 F  *CHILLS WITH OR WITHOUT FEVER  NAUSEA AND VOMITING THAT IS NOT CONTROLLED WITH YOUR NAUSEA MEDICATION  *UNUSUAL SHORTNESS OF BREATH  *UNUSUAL BRUISING OR BLEEDING  TENDERNESS IN MOUTH AND THROAT WITH OR WITHOUT PRESENCE OF ULCERS  *URINARY PROBLEMS  *BOWEL PROBLEMS  UNUSUAL RASH Items with * indicate a potential emergency and should be followed up as soon as possible.  Feel free to call the clinic you have any questions or concerns. The clinic phone number is [phone]. Paclitaxel injection What is this medicine? PACLITAXEL (PAK li TAX el) is a chemotherapy drug. It targets fast dividing cells, like cancer cells, and causes these cells to die. This medicine is used to treat ovarian cancer, breast cancer, and other cancers. This medicine may be used for other purposes; ask your health care provider or pharmacist if you have questions. What should I tell my health care provider before I take this medicine? They need to know if you have any of these conditions: -blood disorders -irregular heartbeat -infection (especially a virus infection such as chickenpox, cold sores, or herpes) -liver disease -previous or ongoing radiation therapy -an unusual or allergic reaction to paclitaxel, alcohol, polyoxyethylated castor oil, other chemotherapy agents, other medicines, foods, dyes, or preservatives -pregnant or trying to get pregnant -breast-feeding How should I use this medicine? This drug is given as an infusion  into a vein. It is administered in a hospital or clinic by a specially trained health care professional. Talk to your pediatrician regarding the use of this medicine in children. Special care may be needed. Overdosage: If you think you have taken too much of this medicine contact a poison control center or emergency room at once. NOTE: This medicine is only for you. Do not share this medicine with others. What if I miss a dose? It is important not to miss your dose. Call your doctor or health care professional if you are unable to keep an appointment. What may interact with this medicine? Do not take this medicine with any of the following medications: -disulfiram -metronidazole This medicine may also interact with the following medications: -cyclosporine -diazepam -ketoconazole -medicines to increase blood counts like filgrastim, pegfilgrastim, sargramostim -other chemotherapy drugs like cisplatin, doxorubicin, epirubicin, etoposide, teniposide, vincristine -quinidine -testosterone -vaccines -verapamil Talk to your doctor or health care professional before taking any of these medicines: -acetaminophen -aspirin -ibuprofen -ketoprofen -naproxen This list may not describe all possible interactions. Give your health care provider a list of all the medicines, herbs, non-prescription drugs, or dietary supplements you use. Also tell them if you smoke, drink alcohol, or use illegal drugs. Some items may interact with your medicine. What should I watch for while using this medicine? Your condition will be monitored carefully while you are receiving this medicine. You will need important blood work done while you are taking this medicine. This drug may make you feel generally unwell. This is not uncommon, as chemotherapy can affect healthy cells as well as cancer cells. Report any side effects. Continue your course of treatment even though you feel ill  unless your doctor tells you to stop. This  medicine can cause serious allergic reactions. To reduce your risk you will need to take other medicine(s) before treatment with this medicine. In some cases, you may be given additional medicines to help with side effects. Follow all directions for their use. Call your doctor or health care professional for advice if you get a fever, chills or sore throat, or other symptoms of a cold or flu. Do not treat yourself. This drug decreases your body's ability to fight infections. Try to avoid being around people who are sick. This medicine may increase your risk to bruise or bleed. Call your doctor or health care professional if you notice any unusual bleeding. Be careful brushing and flossing your teeth or using a toothpick because you may get an infection or bleed more easily. If you have any dental work done, tell your dentist you are receiving this medicine. Avoid taking products that contain aspirin, acetaminophen, ibuprofen, naproxen, or ketoprofen unless instructed by your doctor. These medicines may hide a fever. Do not become pregnant while taking this medicine. Women should inform their doctor if they wish to become pregnant or think they might be pregnant. There is a potential for serious side effects to an unborn child. Talk to your health care professional or pharmacist for more information. Do not breast-feed an infant while taking this medicine. Men are advised not to father a child while receiving this medicine. This product may contain alcohol. Ask your pharmacist or healthcare provider if this medicine contains alcohol. Be sure to tell all healthcare providers you are taking this medicine. Certain medicines, like metronidazole and disulfiram, can cause an unpleasant reaction when taken with alcohol. The reaction includes flushing, headache, nausea, vomiting, sweating, and increased thirst. The reaction can last from 30 minutes to several hours. What side effects may I notice from receiving this  medicine? Side effects that you should report to your doctor or health care professional as soon as possible: -allergic reactions like skin rash, itching or hives, swelling of the face, lips, or tongue -low blood counts - This drug may decrease the number of white blood cells, red blood cells and platelets. You may be at increased risk for infections and bleeding. -signs of infection - fever or chills, cough, sore throat, pain or difficulty passing urine -signs of decreased platelets or bleeding - bruising, pinpoint red spots on the skin, black, tarry stools, nosebleeds -signs of decreased red blood cells - unusually weak or tired, fainting spells, lightheadedness -breathing problems -chest pain -high or low blood pressure -mouth sores -nausea and vomiting -pain, swelling, redness or irritation at the injection site -pain, tingling, numbness in the hands or feet -slow or irregular heartbeat -swelling of the ankle, feet, hands Side effects that usually do not require medical attention (report to your doctor or health care professional if they continue or are bothersome): -bone pain -complete hair loss including hair on your head, underarms, pubic hair, eyebrows, and eyelashes -changes in the color of fingernails -diarrhea -loosening of the fingernails -loss of appetite -muscle or joint pain -red flush to skin -sweating This list may not describe all possible side effects. Call your doctor for medical advice about side effects. You may report side effects to FDA at 1-800-FDA-1088. Where should I keep my medicine? This drug is given in a hospital or clinic and will not be stored at home. NOTE: This sheet is a summary. It may not cover all possible information. If you have  questions about this medicine, talk to your doctor, pharmacist, or health care provider.    2016, Elsevier/Gold Standard. (2015-07-15 13:02:56) Ramucirumab injection What is this medicine? RAMUCIRUMAB (ra mue SIR ue  mab) is a monoclonal antibody. It is used to treat stomach cancer, colorectal cancer, or lung cancer. This medicine may be used for other purposes; ask your health care provider or pharmacist if you have questions. What should I tell my health care provider before I take this medicine? They need to know if you have any of these conditions: -bleeding disorders -blood clots -heart disease, including heart failure, heart attack, or chest pain (angina) -high blood pressure -infection (especially a virus infection such as chickenpox, cold sores, or herpes) -protein in your urine -recent surgery -stroke -an unusual or allergic reaction to ramucirumab, other medicines, foods, dyes, or preservatives -pregnant or trying to get pregnant -breast-feeding How should I use this medicine? This medicine is for infusion into a vein. It is given by a health care professional in a hospital or clinic setting. Talk to your pediatrician regarding the use of this medicine in children. Special care may be needed. Overdosage: If you think you have taken too much of this medicine contact a poison control center or emergency room at once. NOTE: This medicine is only for you. Do not share this medicine with others. What if I miss a dose? It is important not to miss your dose. Call your doctor or health care professional if you are unable to keep an appointment. What may interact with this medicine? Interactions have not been studied. This list may not describe all possible interactions. Give your health care provider a list of all the medicines, herbs, non-prescription drugs, or dietary supplements you use. Also tell them if you smoke, drink alcohol, or use illegal drugs. Some items may interact with your medicine. What should I watch for while using this medicine? Your condition will be monitored carefully while you are receiving this medicine. You will need to to check your blood pressure and have your blood and  urine tested while you are taking this medicine. Your condition will be monitored carefully while you are receiving this medicine. This medicine may increase your risk to bruise or bleed. Call your doctor or health care professional if you notice any unusual bleeding. This medicine may rarely cause 'gastrointestinal perforation' (holes in the stomach, intestines or colon), a serious side effect requiring surgery to repair. This medicine should be started at least 28 days following major surgery and the site of the surgery should be totally healed. Check with your doctor before scheduling dental work or surgery while you are receiving this treatment. Talk to your doctor if you have recently had surgery or if you have a wound that has not healed. Do not become pregnant while taking this medicine or for 3 months after stopping it. Women should inform their doctor if they wish to become pregnant or think they might be pregnant. There is a potential for serious side effects to an unborn child. Talk to your health care professional or pharmacist for more information. What side effects may I notice from receiving this medicine? Side effects that you should report to your doctor or health care professional as soon as possible: -allergic reactions like skin rash, itching or hives, breathing problems, swelling of the face, lips, or tongue -signs of infection - fever or chills, cough, sore throat -chest pain or chest tightness -confusion -dizziness -feeling faint or lightheaded, falls -severe abdominal pain -  severe nausea, vomiting -signs and symptoms of bleeding such as bloody or black, tarry stools; red or dark-brown urine; spitting up blood or brown material that looks like coffee grounds; red spots on the skin; unusual bruising or bleeding from the eye, gums, or nose -signs and symptoms of a blood clot such as breathing problems; changes in vision; chest pain; severe, sudden headache; pain, swelling,  warmth in the leg; trouble speaking; sudden numbness or weakness of the face, arm or leg -symptoms of a stroke: change in mental awareness, inability to talk or move one side of the body -trouble walking, dizziness, loss of balance or coordination Side effects that usually do not require medical attention (Report these to your doctor or health care professional if they continue or are bothersome.): -cold, clammy skin -constipation -diarrhea -headache -nausea, vomiting -stomach pain -unusually slow heartbeat -unusually weak or tired This list may not describe all possible side effects. Call your doctor for medical advice about side effects. You may report side effects to FDA at 1-800-FDA-1088. Where should I keep my medicine? This drug is given in a hospital or clinic and will not be stored at home. NOTE: This sheet is a summary. It may not cover all possible information. If you have questions about this medicine, talk to your doctor, pharmacist, or health care provider.    2016, Elsevier/Gold Standard. (2015-01-26 17:37:19)

## 2016-05-26 NOTE — Progress Notes (Signed)
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 Appetite is better. He remains fatigued. No nausea or vomiting. No mouth sores. No diarrhea. Stable neuropathy symptoms in the hands and feet. He has noted mild dysuria for the past week. No fever.  Objective:  Vital signs in last 24 hours:  Blood pressure 102/61, pulse 91, temperature 97 F (36.1 C), temperature source Oral, resp. rate 20, height 6' (1.829 m), weight 177 lb 9.6 oz (80.559 kg), SpO2 98 %.    HEENT: No thrush or ulcers. Resp: Lungs clear bilaterally. Cardio: Regular rate and rhythm. GI: No hepatomegaly. Vascular: Trace edema right lower leg. Port-A-Cath without erythema.   Lab Results:  Lab Results  Component Value Date   WBC 2.6* 05/26/2016   HGB 11.3* 05/26/2016   HCT 36.1* 05/26/2016   MCV 85.2 05/26/2016   PLT 152 05/26/2016   NEUTROABS 1.7 05/26/2016    Imaging:  No results found.  Medications: I have reviewed the patient's current medications.  Assessment/Plan: 1. Adenocarcinoma of the distal esophagus/gastric cardia-status post an endoscopic biopsy on 03/18/2013 confirming adenocarcinoma, HER-2/neu not amplified  -Staging CT scans 03/20/2013 confirmed a distal esophageal/gastric cardia mass, distal paraesophageal lymph nodes and paratracheal nodes concerning for metastatic lymphadenopathy  -A PET scan 03/27/2013 with a multifocal area of hypermetabolic activity involving the thoracic esophagus extending into the gastric cardia and hypermetabolic mediastinal nodes, no distant metastatic disease  -Initiation of radiation 04/07/2013 and weekly Taxol/carboplatin 04/08/2013, radiation completed 05/15/2013  -Restaging PET scan 06/09/2013-no evidence of metastatic disease, no hypermetabolic lymphadenopathy  -Esophagogastrectomy 07/07/2013 revealed a ypT1b,ypN0 tumor with  negative surgical margins  -CT neck 06/09/2014 revealed a necrotic lymph node in the right tracheoesophageal groove  -PET scan 11/15/2014 revealed a single focus of hypermetabolism corresponding to the necrotic right upper mediastinum lymph node -EUS biopsy of the paraesophageal lymph node 06/25/2012 confirmed metastatic adenocarcinoma  -Radiation completed 08/11/2014 -Rising CEA May 2016 -PET scan 07/13/2015 with hypermetabolic right paratracheal and right hilar adenopathy -Cycle 1 FOLFOX 08/03/2015 -Cycle 2 FOLFOX 08/17/2015 - cycle 3 FOLFOX 08/31/2015 (oxaliplatin dose reduced secondary to thrombocytopenia)  -Cycle 4 FOLFOX 09/14/2015 -Cycle 5 FOLFOX 09/28/2015 - restaging CTs 09/30/2015 with stable disease involving neck/chest lymph nodes, a slight increase in size of left upper lobe lung nodule  - cycle 6 FOLFOX 10/12/2015  -FOLFOX discontinued secondary to development of bilateral vocal cord paralysis November 2016 - CT scan of the neck and chest on 11/22/15 revealed no definite disease in the neck. At the thoracic inlet region, there is questionably slight increase in prominence of soft tissue at the right tracheoesophageal groove. This could be an enlarging node or mass but could also relate to the esophagectomy and pull-through surgery.Enlarging lingular and left lower lobe nodules are worrisome for metastatic disease. Peribronchovascular nodular consolidation in the anterior right lower lobe, slightly more prominent, indeterminate. Cholelithiasis. Left diaphragmatic hernia, stable. -Xeloda 1 week on/1 week off beginning 12/11/2015 -Restaging CTs of the neck and chest 02/07/2016 revealed enlargement of 2 left-sided lung nodules, no other evidence of disease progression -Cycle 1 Pembrolizumab 02/25/2016  -Cycle 2 Pembrolizumab 03/17/2016 -Cycle 3 Pembrolizumab 04/07/2016   -CT 04/20/2016 consistent with progressive disease involving a large pericardial effusion, bilateral  pleural effusions, enlarged mediastinal lymph nodes and lung nodules  Cytology from the pericardial fluid 04/21/2016-positive for metastatic adenocarcinoma -CT 05/01/2016 was slight worsening of right hilar lymphadenopathy with near  complete collapse of the right middle lobe, nodular metastases in the left lung, stable bilateral pleural effusions  Cycle 1 Taxol/ramucirumab 05/12/2016; day 8 Taxol 05/19/2016; day 15 Taxol/ramucirumab 05/26/2016.  2. Indeterminate 15 mm right liver lesion on the CT scan 03/20/2013, MRI of the liver 06/09/2013 confirmed a right liver hemangioma  3. Anorexia/weight loss and solid dysphagia secondary to #1 -improved 4. History of a colon polyp, status post a polypectomy October 2013  5. history of Thrombocytopenia secondary chemotherapy-the carboplatin wase dose reduced with week 5 of chemotherapy chemotherapy  6. hoarseness secondary to right vocal cord paralysis-CT/PET scan imaging consistent with a malignant right paratracheal lymph node causing the vocal cord paralysis  -Status post a laryngeal plasty at [facility] on 09/02/2014 7. Esophageal stricture-status post dilation 06/08/2015 -Food impaction removal 09/29/2015 -Esophageal stricture dilation 10/06/2015 8. Port-A-Cath placement 07/20/2015 9. Delayed nausea following cycle 1 FOLFOX. Aloxi and Emend added with cycle 2. 10. Thrombocytopenia secondary to chemotherapy  11. Emotional lability/depression-prophylactic Decadron discontinued; Remeron initiated 12. Progressive hoarseness November 2016, diagnosed with bilateral vocal cord paralysis, status post a tracheostomy and bilateral injection laryngoplasty at [facility] on 10/27/2015 13. 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 Plan to proceed with cycle 1 day 15 Taxol/ramucirumab today as scheduled. He will return to begin cycle 2 on 06/09/2016. He will contact the office in the interim with any problems.  Plan reviewed with  Dr. Benay Spice.    Mal[place], [place]street ANP/GNP-BC   05/26/2016  3:13 PM

## 2016-05-26 NOTE — Progress Notes (Signed)
Called [facility] to refill Xanax, Remeron, and Tessalon. (patient has refill orders left). Patient can pick the prescriptions up Monday. Informed pt

## 2016-05-27 LAB — URINE CULTURE: ORGANISM ID, BACTERIA: NO GROWTH

## 2016-05-29 MED FILL — MIRTAZAPINE 15 MG TABLET: 15 | 30 days supply | Qty: 30 | Fill #0

## 2016-05-29 MED FILL — BENZONATATE 100 MG CAPSULE: 100 | 30 days supply | Qty: 90 | Fill #0

## 2016-05-29 MED FILL — ALPRAZolam 0.5 MG TABS: 0.5 | 20 days supply | Qty: 60 | Fill #0

## 2016-06-02 ENCOUNTER — Encounter (HOSPITAL_COMMUNITY): Payer: Self-pay | Admitting: *Deleted

## 2016-06-02 NOTE — Progress Notes (Signed)
Had recent surgery for pericardial effusin. Pt denies cardiac history, chest pain or sob.   Echo - 04/25/16 - in [facility] EKG - 05/01/16 - in [facility]

## 2016-06-04 ENCOUNTER — Other Ambulatory Visit: Payer: Self-pay | Admitting: Oncology

## 2016-06-05 ENCOUNTER — Encounter (HOSPITAL_COMMUNITY)
Admission: RE | Disposition: A | Discharge status: Home / Self Care | Payer: Self-pay | Source: Ambulatory Visit | Attending: Internal Medicine

## 2016-06-05 ENCOUNTER — Telehealth: Payer: Self-pay | Admitting: *Deleted

## 2016-06-05 ENCOUNTER — Ambulatory Visit (HOSPITAL_COMMUNITY)
Admission: RE | Admit: 2016-06-05 | Discharge: 2016-06-05 | Disposition: A | Discharge status: Home / Self Care | Payer: BLUE CROSS/BLUE SHIELD | Source: Ambulatory Visit | Attending: Internal Medicine | Admitting: Internal Medicine

## 2016-06-05 ENCOUNTER — Ambulatory Visit (HOSPITAL_COMMUNITY): Payer: BLUE CROSS/BLUE SHIELD | Admitting: Certified Registered Nurse Anesthetist

## 2016-06-05 ENCOUNTER — Encounter (HOSPITAL_COMMUNITY): Payer: Self-pay | Admitting: *Deleted

## 2016-06-05 DIAGNOSIS — Z8501 Personal history of malignant neoplasm of esophagus: Secondary | ICD-10-CM | POA: Insufficient documentation

## 2016-06-05 DIAGNOSIS — Z79899 Other long term (current) drug therapy: Secondary | ICD-10-CM | POA: Insufficient documentation

## 2016-06-05 DIAGNOSIS — Z9049 Acquired absence of other specified parts of digestive tract: Secondary | ICD-10-CM | POA: Diagnosis not present

## 2016-06-05 DIAGNOSIS — Z9221 Personal history of antineoplastic chemotherapy: Secondary | ICD-10-CM | POA: Insufficient documentation

## 2016-06-05 DIAGNOSIS — C799 Secondary malignant neoplasm of unspecified site: Secondary | ICD-10-CM | POA: Insufficient documentation

## 2016-06-05 DIAGNOSIS — R7303 Prediabetes: Secondary | ICD-10-CM | POA: Insufficient documentation

## 2016-06-05 DIAGNOSIS — Z803 Family history of malignant neoplasm of breast: Secondary | ICD-10-CM | POA: Diagnosis not present

## 2016-06-05 DIAGNOSIS — Z923 Personal history of irradiation: Secondary | ICD-10-CM | POA: Diagnosis not present

## 2016-06-05 DIAGNOSIS — F419 Anxiety disorder, unspecified: Secondary | ICD-10-CM | POA: Diagnosis not present

## 2016-06-05 DIAGNOSIS — K222 Esophageal obstruction: Secondary | ICD-10-CM | POA: Insufficient documentation

## 2016-06-05 DIAGNOSIS — R1310 Dysphagia, unspecified: Secondary | ICD-10-CM | POA: Insufficient documentation

## 2016-06-05 DIAGNOSIS — G4730 Sleep apnea, unspecified: Secondary | ICD-10-CM | POA: Insufficient documentation

## 2016-06-05 DIAGNOSIS — Z800 Family history of malignant neoplasm of digestive organs: Secondary | ICD-10-CM | POA: Diagnosis not present

## 2016-06-05 DIAGNOSIS — Z930 Tracheostomy status: Secondary | ICD-10-CM | POA: Diagnosis not present

## 2016-06-05 HISTORY — DX: Reserved for inherently not codable concepts without codable children: IMO0001

## 2016-06-05 HISTORY — PX: BALLOON DILATION: SHX5330

## 2016-06-05 HISTORY — PX: ESOPHAGOGASTRODUODENOSCOPY: SHX5428

## 2016-06-05 SURGERY — EGD (ESOPHAGOGASTRODUODENOSCOPY)
Anesthesia: Monitor Anesthesia Care

## 2016-06-05 MED ORDER — HEPARIN SOD (PORK) LOCK FLUSH 100 UNIT/ML IV SOLN
500.0000 [IU] | INTRAVENOUS | Status: AC | PRN
  Administered 2016-06-05: 500 [IU]

## 2016-06-05 MED ORDER — LACTATED RINGERS IV SOLN
INTRAVENOUS | Status: DC | PRN
  Administered 2016-06-05: 15:00:00 via INTRAVENOUS

## 2016-06-05 MED ORDER — SODIUM CHLORIDE 0.9 % IV SOLN: INTRAVENOUS | Status: DC

## 2016-06-05 MED ORDER — PROPOFOL 10 MG/ML IV BOLUS
INTRAVENOUS | Status: DC | PRN
  Administered 2016-06-05 (×2): 30 mg via INTRAVENOUS

## 2016-06-05 MED ORDER — FENTANYL CITRATE (PF) 100 MCG/2ML IJ SOLN
INTRAMUSCULAR | Status: DC | PRN
  Administered 2016-06-05: 50 ug via INTRAVENOUS

## 2016-06-05 MED ORDER — PROPOFOL 500 MG/50ML IV EMUL
INTRAVENOUS | Status: DC | PRN
  Administered 2016-06-05: 100 ug/kg/min via INTRAVENOUS

## 2016-06-05 MED ORDER — MIDAZOLAM HCL 5 MG/5ML IJ SOLN
INTRAMUSCULAR | Status: DC | PRN
  Administered 2016-06-05: 2 mg via INTRAVENOUS

## 2016-06-05 NOTE — Anesthesia Preprocedure Evaluation (Signed)
Anesthesia Evaluation  Patient identified by MRN, date of birth, ID band Patient awake  General Assessment Comment:COMPLETE ESOPHAGECTOMY 07/07/2013   Reviewed: Allergy & Precautions, H&P , NPO status , Patient's Chart, lab work & pertinent test results  Airway Mallampati: Trach       Dental no notable dental hx. (+) Teeth Intact   Pulmonary sleep apnea ,  Tracheostomy   breath sounds clear to auscultation       Cardiovascular negative cardio ROS   Rhythm:Regular Rate:Tachycardia  Study Conclusions  - Left ventricle: The cavity size was normal. Systolic function was normal. The estimated ejection fraction was in the range of 60% to 65%. Wall motion was normal; there were no regional wall motion abnormalities. Left ventricular diastolic function parameters were normal. - Pericardium, extracardiac: A large, free-flowing pericardial effusion was identified circumferential to the heart. The fluid had no internal echoes. There was moderateright ventricular chamber collapse for more than 50% of the cardiac cycle. There was evidence for moderately increased RV-LV interaction demonstrated by respirophasic changes in transmitral velocities. Features were consistent with tamponade physiology.    Neuro/Psych PSYCHIATRIC DISORDERS negative neurological ROS     GI/Hepatic Neg liver ROS, Esophagectomy   Endo/Other  diabetes  Renal/GU negative Renal ROS  negative genitourinary   Musculoskeletal negative musculoskeletal ROS (+)   Abdominal   Peds negative pediatric ROS (+)  Hematology negative hematology ROS (+)   Anesthesia Other Findings   Reproductive/Obstetrics negative OB ROS                             Anesthesia Physical  Anesthesia Plan  ASA: III  Anesthesia Plan: MAC and General   Post-op Pain Management:    Induction: Intravenous  Airway Management Planned: Mask and Natural  Airway  Additional Equipment:   Intra-op Plan:   Post-operative Plan:   Informed Consent: I have reviewed the patients History and Physical, chart, labs and discussed the procedure including the risks, benefits and alternatives for the proposed anesthesia with the patient or authorized representative who has indicated his/her understanding and acceptance.   Dental Advisory Given  Plan Discussed with: CRNA and Surgeon  Anesthesia Plan Comments: (Discussed sedation and potential to need to place pediatric ETT through trach if warranted by clinical changes intra-operatively. We will start procedure as MAC.)        Anesthesia Quick Evaluation                                  Anesthesia Evaluation  Patient identified by MRN, date of birth, ID band Patient awake  General Assessment Comment:COMPLETE ESOPHAGECTOMY 07/07/2013   Reviewed: Allergy & Precautions, H&P , NPO status , Patient's Chart, lab work & pertinent test results  History of Anesthesia Complications (+) PONV  Airway Mallampati: Trach       Dental no notable dental hx. (+) Teeth Intact   Pulmonary sleep apnea ,  Tracheostomy   breath sounds clear to auscultation       Cardiovascular negative cardio ROS   Rhythm:Regular Rate:Tachycardia  Study Conclusions  - Left ventricle: The cavity size was normal. Systolic function was normal. The estimated ejection fraction was in the range of 60% to 65%. Wall motion was normal; there were no regional wall motion abnormalities. Left ventricular diastolic function parameters were normal. - Pericardium, extracardiac: A large, free-flowing pericardial effusion was identified circumferential to the  heart. The fluid had no internal echoes. There was moderateright ventricular chamber collapse for more than 50% of the cardiac cycle. There was evidence for moderately increased RV-LV interaction demonstrated by respirophasic changes in transmitral  velocities. Features were consistent with tamponade physiology.    Neuro/Psych PSYCHIATRIC DISORDERS Anxiety negative neurological ROS     GI/Hepatic Neg liver ROS, Esophagectomy   Endo/Other  diabetes  Renal/GU negative Renal ROS  negative genitourinary   Musculoskeletal negative musculoskeletal ROS (+)   Abdominal   Peds negative pediatric ROS (+)  Hematology negative hematology ROS (+)   Anesthesia Other Findings   Reproductive/Obstetrics negative OB ROS                            Anesthesia Physical  Anesthesia Plan  ASA: IV  Anesthesia Plan: General   Post-op Pain Management:    Induction: Intravenous  Airway Management Planned: Oral ETT  Additional Equipment: Arterial line  Intra-op Plan:   Post-operative Plan: Possible Post-op intubation/ventilation  Informed Consent: I have reviewed the patients History and Physical, chart, labs and discussed the procedure including the risks, benefits and alternatives for the proposed anesthesia with the patient or authorized representative who has indicated his/her understanding and acceptance.     Plan Discussed with: CRNA, Anesthesiologist and Surgeon  Anesthesia Plan Comments: (Possible TEE, will carefully attempt.  Right IJ portacath tunnelled to Right chest, left IJ in previous esophageal surgical site.  Will proceed with PIV only.)       Anesthesia Quick Evaluation

## 2016-06-05 NOTE — H&P (Signed)
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 male with metastatic adenocarcinoma of the esophagus status post prior surgery, radiation, and chemotherapy. Has had prior endoscopy with dilation of anastomotic stricture. Complains of progressive dysphagia to most items not liquid. Now for endoscopy with anesthesia assistance and possible esophageal dilation.  REVIEW OF SYSTEMS:  All non-GI ROS negative except for fatigue  Past Medical History  Diagnosis Date  . Hyperlipemia   . Colon polyp   . Pre-diabetes   . GERD (gastroesophageal reflux disease)   . History of radiation therapy 04/07/13-05-15-13    [place] ca,50.4Gy/38f [place]  . Sleep apnea     no c-pap at this time  . Anxiety   . Esophageal cancer ([facility] 03/18/2013    Adenocarcinoma  . Diverticulosis   . Shortness of breath dyspnea     Past Surgical History  Procedure Laterality Date  . Appendectomy    . Lipoma excision    . Edg  03/18/2013    Esophageal Mass  . Complete esophagectomy N/A 07/07/2013    Procedure: ESOPHAGECTOMY COMPLETE;  Surgeon: EGrace Isaac MD;  [place] [facility]  Service: Thoracic;  Laterality: N/A;  transhiatel esophagectomy, feeding jejunostomy  . Video bronchoscopy N/A 07/07/2013    Procedure: VIDEO BRONCHOSCOPY;  Surgeon: EGrace Isaac MD;  [place] [facility]  Service: Thoracic;  Laterality: N/A;  . Eus N/A 06/25/2014    Procedure: UPPER ENDOSCOPIC ULTRASOUND (EUS) LINEAR;  Surgeon: DMilus Banister MD;  Location: [facility];  Service: Endoscopy;  Laterality: N/A;  . Esophagogastroduodenoscopy N/A 07/02/2014    Procedure: ESOPHAGOGASTRODUODENOSCOPY (EGD);  Surgeon: CGatha Mayer MD;  [facility];  Service: Endoscopy;  Laterality: N/A;  . Vocal chord surgery      pushed right vocal cord more centered- September 2015  . Esophagogastroduodenoscopy N/A 02/16/2015    Procedure: ESOPHAGOGASTRODUODENOSCOPY (EGD);  Surgeon: DLafayette Dragon MD;   [facility];  Service: Endoscopy;  Laterality: N/A;  . Upper gastrointestinal endoscopy    . Colonoscopy    . Esophagogastroduodenoscopy N/A 09/29/2015    Procedure: ESOPHAGOGASTRODUODENOSCOPY (EGD);  Surgeon: MLadene Artist MD;  [facility];  Service: Endoscopy;  Laterality: N/A;  . Subxyphoid pericardial window N/A 04/21/2016    Procedure: SUBXYPHOID PERICARDIAL WINDOW;  Surgeon: SMelrose Nakayama MD;  [place] [facility]  Service: Thoracic;  Laterality: N/A;    Social History TManon HildingTselides, Dr.  reports that he has never smoked. He has never used smokeless tobacco. He reports that he drinks about 1.2 oz of alcohol per week. He reports that he does not use illicit drugs.  family history includes Breast cancer (age of onset: 89 in his maternal aunt; Colon cancer in his mother; Diabetes in his maternal aunt and mother; Esophageal cancer (age of onset: 745 in his maternal aunt; Hypertension (age of onset: 430 in his brother; Kidney failure (age of onset: 418 in his brother. There is no history of Stomach cancer or Rectal cancer.  Allergies  Allergen Reactions  . Lactose Intolerance (Gi) Other (See Comments)    Upset stomach        PHYSICAL EXAMINATION: Vital signs: BP 93/52 mmHg  Pulse 48  Temp(Src) 98.5 F (36.9 C) (Oral)  Resp 8  SpO2 96% General: Well-developed, well-nourished, no acute distress HEENT: Sclerae are anicteric, Tracheostomy Lungs: Clear with diminished breath sounds Heart: Regular Abdomen: soft, nontender, nondistended, no obvious ascites, no peritoneal signs, normal  bowel sounds. No organomegaly. Extremities: No edema Psychiatric: alert and oriented x3. Cooperative   ASSESSMENT:  1. Metastatic esophageal cancer status post surgery, chemotherapy, radiation. 2. Dysphagia. Rule out anastomotic stricture versus extrinsic compression   PLAN:  1. Upper endoscopy with possible esophageal dilation. The nature of the procedure, as  well as the risks, benefits, and alternatives were carefully and thoroughly reviewed with the patient. Ample time for discussion and questions allowed. The patient understood, was satisfied, and agreed to proceed.

## 2016-06-05 NOTE — Discharge Instructions (Signed)

## 2016-06-05 NOTE — Transfer of Care (Signed)
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) Performed: Procedure(s): ESOPHAGOGASTRODUODENOSCOPY (EGD) (N/A) BALLOON DILATION (N/A)  Patient Location: Endoscopy Unit  Anesthesia Type:MAC  Level of Consciousness: awake, alert  and oriented  Airway & Oxygen Therapy: Patient Spontanous Breathing and Patient connected to tracheostomy mask oxygen  Post-op Assessment: Report given to RN, Post -op Vital signs reviewed and stable and Patient moving all extremities  Post vital signs: Reviewed and stable  Last Vitals:  Filed Vitals:   06/05/16 1540 06/05/16 1550  BP: 115/78 119/72  Pulse: 76 59  Temp:    Resp: 16 18    Last Pain: There were no vitals filed for this visit.       Complications: No apparent anesthesia complications

## 2016-06-05 NOTE — Telephone Encounter (Signed)
Joel Osborne called with spouse Joel Osborne with the following request.  Joel Osborne asked for Dr. Benay Spice to call her mobile [phone] because Cohen having procedure to throat today.  "I need a letter from Dr. Benay Spice for "[place]" that reads: 1. I am generally ill and not much time to live.  Dr. Benay Spice shared this with me about three weeks ago. 2. Date of original diagnosis or onset of the cancer.  I think it was 2015, 2013 or so.   3. I have difficulty with my memory and calculations so my [name] is my FINANCIAL REPRESENTATIVE and [place] for bills.  We're in hard times financially and the only way Dept of Social Security can expedite or help me is proof from MD that I am terminally ill, going back to onset and POA is not what they need but a representative payee.   We meet with them This Wednesday morning at 9:00.  You can fax the letter or we can pick it up.

## 2016-06-05 NOTE — Op Note (Signed)
[facility] Patient Name: Joel Osborne Procedure Date : 06/05/2016 MRN: [id number] Attending MD: Docia Chuck. Henrene Pastor , MD Date of Birth: August 09, 1966 CSN: [id number] Age: 50 Admit Type: Outpatient Procedure:                Upper GI endoscopy, with balloon dilation of the                            esophagus to 18 mm Indications:              Dysphagia Providers:                Docia Chuck. Henrene Pastor, MD, Vista Lawman, RN, Cletis Athens,                            Technician Referring MD:              Medicines:                Monitored Anesthesia Care Complications:            No immediate complications. Estimated Blood Loss:     Estimated blood loss: none. Procedure:                Pre-Anesthesia Assessment:                           - Prior to the procedure, a History and Physical                            was performed, and patient medications and                            allergies were reviewed. The patient's tolerance of                            previous anesthesia was also reviewed. The risks                            and benefits of the procedure and the sedation                            options and risks were discussed with the patient.                            All questions were answered, and informed consent                            was obtained. Prior Anticoagulants: The patient has                            taken no previous anticoagulant or antiplatelet                            agents. ASA Grade Assessment: IV - A patient with  severe systemic disease that is a constant threat                            to life. After reviewing the risks and benefits,                            the patient was deemed in satisfactory condition to                            undergo the procedure.                           After obtaining informed consent, the endoscope was                            passed under direct vision. Throughout the                      procedure, the patient's blood pressure, pulse, and                            oxygen saturations were monitored continuously. The                            EG-[id number] ([id number]) scope was introduced through the                            mouth, and advanced to the second part of duodenum.                            The upper GI endoscopy was accomplished without                            difficulty. The patient tolerated the procedure                            well. Scope In: Scope Out: Findings:      Short esophageal remnant.One moderate (circumferential scarring or       stenosis; an endoscope may pass) benign-appearing, intrinsic stenosis       was found, in the proximal esophagus at the surgical anastomosis. There       was an adjacent benign pouch. The diameter was estimated to be       approximately 14 mm.Marland Kitchen And was traversed. A TTS dilator was passed       through the scope. Dilation with a 15-16.5-18 mm balloon dilator was       performed to 18 mm.      The entire examined stomach was normal, status post esophagectomy.      The examined duodenum was normal. Impression:               - Status post esophagectomy.Benign-appearing                            esophageal stenosis at gastroesophageal  anastomosis. Dilated to 18 mm. Adjacent pouch and                            proximal esophagus which could pool food or pills.                           - Normal stomach, postoperative.                           - Normal examined duodenum.                           - No specimens collected. Moderate Sedation:      none Recommendation:           - Continue to chew food extremely well. Small                            portions with plenty of liquids.                           - Return to the care of Dr. Benay Spice.                           - Continue present medications. Procedure Code(s):        --- Professional ---                            [phone], Esophagogastroduodenoscopy, flexible,                            transoral; with transendoscopic balloon dilation of                            esophagus (less than 30 mm diameter) Diagnosis Code(s):        --- Professional ---                           K22.2, Esophageal obstruction                           R13.10, Dysphagia, unspecified CPT copyright 2016 American Medical Association. All rights reserved. The codes documented in this report are preliminary and upon coder review may  be revised to meet current compliance requirements. Docia Chuck. Henrene Pastor, MD 06/05/2016 3:12:16 PM This report has been signed electronically. Number of Addenda: 0

## 2016-06-06 ENCOUNTER — Encounter: Payer: Self-pay | Admitting: Oncology

## 2016-06-06 ENCOUNTER — Encounter (HOSPITAL_COMMUNITY): Payer: Self-pay | Admitting: Internal Medicine

## 2016-06-06 ENCOUNTER — Telehealth: Payer: Self-pay | Admitting: Oncology

## 2016-06-06 ENCOUNTER — Encounter: Payer: Self-pay | Admitting: *Deleted

## 2016-06-06 NOTE — Anesthesia Postprocedure Evaluation (Signed)
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               Nolon Nations

## 2016-06-06 NOTE — Telephone Encounter (Signed)
Gave pt cal, pt came in to can/resched tx said he spoke w/ MD

## 2016-06-06 NOTE — Progress Notes (Signed)
Letter for social security disability from Dr. Benay Spice faxed to patient's wife at [phone]. Call placed to wife to inform her of fax. Fax transmission complete.

## 2016-06-09 ENCOUNTER — Ambulatory Visit: Payer: BLUE CROSS/BLUE SHIELD

## 2016-06-09 ENCOUNTER — Ambulatory Visit: Payer: BLUE CROSS/BLUE SHIELD | Admitting: Nurse Practitioner

## 2016-06-09 ENCOUNTER — Other Ambulatory Visit: Payer: BLUE CROSS/BLUE SHIELD

## 2016-06-14 DIAGNOSIS — Z736 Limitation of activities due to disability: Secondary | ICD-10-CM

## 2016-06-16 ENCOUNTER — Other Ambulatory Visit: Payer: BLUE CROSS/BLUE SHIELD

## 2016-06-16 ENCOUNTER — Ambulatory Visit: Payer: BLUE CROSS/BLUE SHIELD

## 2016-06-16 ENCOUNTER — Ambulatory Visit: Payer: BLUE CROSS/BLUE SHIELD | Admitting: Nurse Practitioner

## 2016-06-18 ENCOUNTER — Other Ambulatory Visit: Payer: Self-pay | Admitting: Oncology

## 2016-06-19 ENCOUNTER — Other Ambulatory Visit: Payer: Self-pay | Admitting: Oncology

## 2016-06-19 MED FILL — HYDROCOD-APAP 7.5-325/15ML: 7.5-325 | 12 days supply | Qty: 480 | Fill #0

## 2016-06-19 MED FILL — ALPRAZolam 0.5 MG TABS: 0.5 | 20 days supply | Qty: 60 | Fill #0

## 2016-06-20 ENCOUNTER — Telehealth: Payer: Self-pay | Admitting: *Deleted

## 2016-06-20 NOTE — Telephone Encounter (Signed)
Message from pt requesting return call.  Spoke with pt's wife: She had questions regarding schedule. Plan was for 3 weeks on/ 1 week off. They took a break for vacation and schedule isn't correct. Informed her we can schedule 7/20 treatment after pt sees provider this week. She voiced understanding. Asks when he will be scanned to see how he is responding to this tx. Informed her provider will discuss that during visit.

## 2016-06-22 ENCOUNTER — Ambulatory Visit (HOSPITAL_BASED_OUTPATIENT_CLINIC_OR_DEPARTMENT_OTHER): Payer: BLUE CROSS/BLUE SHIELD

## 2016-06-22 ENCOUNTER — Ambulatory Visit (HOSPITAL_BASED_OUTPATIENT_CLINIC_OR_DEPARTMENT_OTHER): Payer: BLUE CROSS/BLUE SHIELD | Admitting: Nurse Practitioner

## 2016-06-22 ENCOUNTER — Other Ambulatory Visit: Payer: Self-pay | Admitting: Nurse Practitioner

## 2016-06-22 ENCOUNTER — Telehealth: Payer: Self-pay | Admitting: Oncology

## 2016-06-22 ENCOUNTER — Ambulatory Visit: Payer: BLUE CROSS/BLUE SHIELD

## 2016-06-22 ENCOUNTER — Other Ambulatory Visit (HOSPITAL_BASED_OUTPATIENT_CLINIC_OR_DEPARTMENT_OTHER): Payer: BLUE CROSS/BLUE SHIELD

## 2016-06-22 VITALS — BP 119/74 | HR 100 | Temp 98.4°F | Resp 20 | Ht 72.0 in | Wt 185.1 lb

## 2016-06-22 VITALS — HR 97

## 2016-06-22 DIAGNOSIS — Z5111 Encounter for antineoplastic chemotherapy: Secondary | ICD-10-CM

## 2016-06-22 DIAGNOSIS — C771 Secondary and unspecified malignant neoplasm of intrathoracic lymph nodes: Secondary | ICD-10-CM | POA: Diagnosis not present

## 2016-06-22 DIAGNOSIS — C155 Malignant neoplasm of lower third of esophagus: Secondary | ICD-10-CM

## 2016-06-22 DIAGNOSIS — Z79899 Other long term (current) drug therapy: Secondary | ICD-10-CM

## 2016-06-22 DIAGNOSIS — J910 Malignant pleural effusion: Secondary | ICD-10-CM

## 2016-06-22 DIAGNOSIS — Z5112 Encounter for antineoplastic immunotherapy: Secondary | ICD-10-CM

## 2016-06-22 DIAGNOSIS — C158 Malignant neoplasm of overlapping sites of esophagus: Secondary | ICD-10-CM

## 2016-06-22 DIAGNOSIS — Z95828 Presence of other vascular implants and grafts: Secondary | ICD-10-CM

## 2016-06-22 LAB — URINALYSIS, MICROSCOPIC - CHCC
BILIRUBIN (URINE): NEGATIVE
Blood: NEGATIVE
Glucose: NEGATIVE mg/dL
KETONES: NEGATIVE mg/dL
Leukocyte Esterase: NEGATIVE
NITRITE: NEGATIVE
Protein: 30 mg/dL
Specific Gravity, Urine: 1.015 (ref 1.003–1.035)
Urobilinogen, UR: 0.2 mg/dL (ref 0.2–1)
pH: 6.5 (ref 4.6–8.0)

## 2016-06-22 LAB — CBC WITH DIFFERENTIAL/PLATELET
BASO%: 1.2 % (ref 0.0–2.0)
Basophils Absolute: 0.1 10*3/uL (ref 0.0–0.1)
EOS ABS: 0.2 10*3/uL (ref 0.0–0.5)
EOS%: 2.7 % (ref 0.0–7.0)
HCT: 36.8 % — ABNORMAL LOW (ref 38.4–49.9)
HEMOGLOBIN: 11.9 g/dL — AB (ref 13.0–17.1)
LYMPH%: 11.3 % — ABNORMAL LOW (ref 14.0–49.0)
MCH: 27.4 pg (ref 27.2–33.4)
MCHC: 32.3 g/dL (ref 32.0–36.0)
MCV: 84.9 fL (ref 79.3–98.0)
MONO#: 1 10*3/uL — AB (ref 0.1–0.9)
MONO%: 11.1 % (ref 0.0–14.0)
NEUT%: 73.7 % (ref 39.0–75.0)
NEUTROS ABS: 6.5 10*3/uL (ref 1.5–6.5)
PLATELETS: 207 10*3/uL (ref 140–400)
RBC: 4.34 10*6/uL (ref 4.20–5.82)
RDW: 20.2 % — AB (ref 11.0–14.6)
WBC: 8.8 10*3/uL (ref 4.0–10.3)
lymph#: 1 10*3/uL (ref 0.9–3.3)

## 2016-06-22 LAB — COMPREHENSIVE METABOLIC PANEL
ALBUMIN: 3.2 g/dL — AB (ref 3.5–5.0)
ALK PHOS: 97 U/L (ref 40–150)
AST: 11 U/L (ref 5–34)
Anion Gap: 9 mEq/L (ref 3–11)
BILIRUBIN TOTAL: 0.32 mg/dL (ref 0.20–1.20)
BUN: 7.5 mg/dL (ref 7.0–26.0)
CO2: 26 meq/L (ref 22–29)
CREATININE: 0.7 mg/dL (ref 0.7–1.3)
Calcium: 9.2 mg/dL (ref 8.4–10.4)
Chloride: 105 mEq/L (ref 98–109)
GLUCOSE: 105 mg/dL (ref 70–140)
Potassium: 4 mEq/L (ref 3.5–5.1)
SODIUM: 141 meq/L (ref 136–145)
TOTAL PROTEIN: 7.2 g/dL (ref 6.4–8.3)

## 2016-06-22 MED ORDER — DIPHENHYDRAMINE HCL 50 MG/ML IJ SOLN
25.0000 mg | Freq: Once | INTRAMUSCULAR | Status: AC
  Administered 2016-06-22: 25 mg via INTRAVENOUS

## 2016-06-22 MED ORDER — HEPARIN SOD (PORK) LOCK FLUSH 100 UNIT/ML IV SOLN
500.0000 [IU] | Freq: Once | INTRAVENOUS | Status: AC | PRN
  Administered 2016-06-22: 500 [IU]
  Filled 2016-06-22: qty 5

## 2016-06-22 MED ORDER — SODIUM CHLORIDE 0.9 % IV SOLN
Freq: Once | INTRAVENOUS | Status: AC
  Administered 2016-06-22: 15:00:00 via INTRAVENOUS

## 2016-06-22 MED ORDER — ACETAMINOPHEN 325 MG PO TABS
ORAL_TABLET | ORAL | Status: AC
  Filled 2016-06-22: qty 2

## 2016-06-22 MED ORDER — FAMOTIDINE IN NACL 20-0.9 MG/50ML-% IV SOLN
INTRAVENOUS | Status: AC
  Filled 2016-06-22: qty 50

## 2016-06-22 MED ORDER — SODIUM CHLORIDE 0.9 % IV SOLN
80.0000 mg/m2 | Freq: Once | INTRAVENOUS | Status: AC
  Administered 2016-06-22: 162 mg via INTRAVENOUS
  Filled 2016-06-22: qty 27

## 2016-06-22 MED ORDER — ACETAMINOPHEN 160 MG/5ML PO SOLN
650.0000 mg | Freq: Once | ORAL | Status: AC
  Administered 2016-06-22: 650 mg via ORAL
  Filled 2016-06-22: qty 20.3

## 2016-06-22 MED ORDER — SODIUM CHLORIDE 0.9 % IV SOLN
10.0000 mg | Freq: Once | INTRAVENOUS | Status: AC
  Administered 2016-06-22: 10 mg via INTRAVENOUS
  Filled 2016-06-22: qty 1

## 2016-06-22 MED ORDER — DIPHENHYDRAMINE HCL 50 MG/ML IJ SOLN
INTRAMUSCULAR | Status: AC
  Filled 2016-06-22: qty 1

## 2016-06-22 MED ORDER — SODIUM CHLORIDE 0.9 % IV SOLN
8.0000 mg/kg | Freq: Once | INTRAVENOUS | Status: AC
  Administered 2016-06-22: 600 mg via INTRAVENOUS
  Filled 2016-06-22: qty 50

## 2016-06-22 MED ORDER — SODIUM CHLORIDE 0.9 % IJ SOLN
10.0000 mL | INTRAMUSCULAR | Status: DC | PRN
  Administered 2016-06-22: 10 mL via INTRAVENOUS
  Filled 2016-06-22: qty 10

## 2016-06-22 MED ORDER — FAMOTIDINE IN NACL 20-0.9 MG/50ML-% IV SOLN
20.0000 mg | Freq: Once | INTRAVENOUS | Status: AC
  Administered 2016-06-22: 20 mg via INTRAVENOUS

## 2016-06-22 MED ORDER — SODIUM CHLORIDE 0.9% FLUSH
10.0000 mL | INTRAVENOUS | Status: DC | PRN
  Administered 2016-06-22: 10 mL
  Filled 2016-06-22: qty 10

## 2016-06-22 NOTE — Patient Instructions (Signed)

## 2016-06-22 NOTE — Progress Notes (Signed)
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 He completed cycle 1 day 15 Taxol/ramucirumab 05/26/2016. He was scheduled to begin cycle 2 on 06/09/2016. He rescheduled that appointment to today.  He denies nausea/vomiting. No mouth sores. He has noted some gum sensitivity. He had "mild diarrhea" following the last chemotherapy. No rash. He reports mild dysuria. He has stable neuropathy symptoms in the hands and feet. Appetite is better. He is gaining weight.  Objective:  Vital signs in last 24 hours:  Blood pressure 119/74, pulse 100, temperature 98.4 F (36.9 C), temperature source Oral, resp. rate 20, height 6' (1.829 m), weight 185 lb 1.6 oz (83.961 kg), SpO2 97 %.    HEENT: No thrush or ulcers. Resp: Lungs clear bilaterally. Cardio: Regular rate and rhythm. GI: Abdomen soft and nontender. No hepatomegaly. Vascular: No leg edema. The right lower leg appears slightly larger than the left lower leg. Calves are soft and nontender. Port-A-Cath without erythema.     Lab Results:  Lab Results  Component Value Date   WBC 8.8 06/22/2016   HGB 11.9* 06/22/2016   HCT 36.8* 06/22/2016   MCV 84.9 06/22/2016   PLT 207 06/22/2016   NEUTROABS 6.5 06/22/2016    Imaging:  No results found.  Medications: I have reviewed the patient's current medications.  Assessment/Plan: 1. Adenocarcinoma of the distal esophagus/gastric cardia-status post an endoscopic biopsy on 03/18/2013 confirming adenocarcinoma, HER-2/neu not amplified  -Staging CT scans 03/20/2013 confirmed a distal esophageal/gastric cardia mass, distal paraesophageal lymph nodes and paratracheal nodes concerning for metastatic lymphadenopathy  -A PET scan 03/27/2013 with a multifocal area of hypermetabolic activity involving the thoracic esophagus extending into the gastric cardia and hypermetabolic mediastinal nodes, no distant  metastatic disease  -Initiation of radiation 04/07/2013 and weekly Taxol/carboplatin 04/08/2013, radiation completed 05/15/2013  -Restaging PET scan 06/09/2013-no evidence of metastatic disease, no hypermetabolic lymphadenopathy  -Esophagogastrectomy 07/07/2013 revealed a ypT1b,ypN0 tumor with negative surgical margins  -CT neck 06/09/2014 revealed a necrotic lymph node in the right tracheoesophageal groove  -PET scan 11/15/2014 revealed a single focus of hypermetabolism corresponding to the necrotic right upper mediastinum lymph node -EUS biopsy of the paraesophageal lymph node 06/25/2012 confirmed metastatic adenocarcinoma  -Radiation completed 08/11/2014 -Rising CEA May 2016 -PET scan 07/13/2015 with hypermetabolic right paratracheal and right hilar adenopathy -Cycle 1 FOLFOX 08/03/2015 -Cycle 2 FOLFOX 08/17/2015 - cycle 3 FOLFOX 08/31/2015 (oxaliplatin dose reduced secondary to thrombocytopenia)  -Cycle 4 FOLFOX 09/14/2015 -Cycle 5 FOLFOX 09/28/2015 - restaging CTs 09/30/2015 with stable disease involving neck/chest lymph nodes, a slight increase in size of left upper lobe lung nodule  - cycle 6 FOLFOX 10/12/2015  -FOLFOX discontinued secondary to development of bilateral vocal cord paralysis November 2016 - CT scan of the neck and chest on 11/22/15 revealed no definite disease in the neck. At the thoracic inlet region, there is questionably slight increase in prominence of soft tissue at the right tracheoesophageal groove. This could be an enlarging node or mass but could also relate to the esophagectomy and pull-through surgery.Enlarging lingular and left lower lobe nodules are worrisome for metastatic disease. Peribronchovascular nodular consolidation in the anterior right lower lobe, slightly more prominent, indeterminate. Cholelithiasis. Left diaphragmatic hernia, stable. -Xeloda 1 week on/1 week off beginning 12/11/2015 -Restaging CTs of the neck and chest 02/07/2016 revealed  enlargement of 2 left-sided lung nodules, no other evidence of disease progression -Cycle 1 Pembrolizumab 02/25/2016  -Cycle 2 Pembrolizumab 03/17/2016 -  Cycle 3 Pembrolizumab 04/07/2016   -CT 04/20/2016 consistent with progressive disease involving a large pericardial effusion, bilateral pleural effusions, enlarged mediastinal lymph nodes and lung nodules  Cytology from the pericardial fluid 04/21/2016-positive for metastatic adenocarcinoma -CT 05/01/2016 was slight worsening of right hilar lymphadenopathy with near complete collapse of the right middle lobe, nodular metastases in the left lung, stable bilateral pleural effusions  Cycle 1 Taxol/ramucirumab 05/12/2016; day 8 Taxol 05/19/2016; day 15 Taxol/ramucirumab 05/26/2016.  Cycle 2 day 1 Taxol/ramucirumab 06/22/2016  2. Indeterminate 15 mm right liver lesion on the CT scan 03/20/2013, MRI of the liver 06/09/2013 confirmed a right liver hemangioma  3. Anorexia/weight loss and solid dysphagia secondary to #1 -improved 4. History of a colon polyp, status post a polypectomy October 2013  5. history of Thrombocytopenia secondary chemotherapy-the carboplatin wase dose reduced with week 5 of chemotherapy chemotherapy  6. hoarseness secondary to right vocal cord paralysis-CT/PET scan imaging consistent with a malignant right paratracheal lymph node causing the vocal cord paralysis  -Status post a laryngeal plasty at [facility] on 09/02/2014 7. Esophageal stricture-status post dilation 06/08/2015 -Food impaction removal 09/29/2015 -Esophageal stricture dilation 10/06/2015 8. Port-A-Cath placement 07/20/2015 9. Delayed nausea following cycle 1 FOLFOX. Aloxi and Emend added with cycle 2. 10. Thrombocytopenia secondary to chemotherapy  11. Emotional lability/depression-prophylactic Decadron discontinued; Remeron initiated 12. Progressive hoarseness November 2016, diagnosed with bilateral vocal cord paralysis, status post a tracheostomy and  bilateral injection laryngoplasty at [facility] on 10/27/2015 13. 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 He has completed 1 cycle of Taxol/ramucirumab. Plan to proceed with cycle 2 today as scheduled.  He reports mild dysuria. We will check a urinalysis today.  He will return for the day 8 Taxol in one week. He will return for a follow-up visit and day 15 Taxol/ramucirumab on 07/06/2016. He will contact the office in the interim with any problems.    Francois, [place] ANP/GNP-BC   06/22/2016  2:03 PM

## 2016-06-22 NOTE — Telephone Encounter (Signed)
Appointments complete per 7/13 pof. Patient will avs report and appointments in infusion area - patient aware.

## 2016-06-22 NOTE — Patient Instructions (Signed)
[facility] Discharge Instructions for Patients Receiving Chemotherapy  Today you received the following chemotherapy agents: Cyramza and Taxol   To help prevent nausea and vomiting after your treatment, we encourage you to take your nausea medication as directed.    If you develop nausea and vomiting that is not controlled by your nausea medication, call the clinic.   BELOW ARE SYMPTOMS THAT SHOULD BE REPORTED IMMEDIATELY:  *FEVER GREATER THAN 100.5 F  *CHILLS WITH OR WITHOUT FEVER  NAUSEA AND VOMITING THAT IS NOT CONTROLLED WITH YOUR NAUSEA MEDICATION  *UNUSUAL SHORTNESS OF BREATH  *UNUSUAL BRUISING OR BLEEDING  TENDERNESS IN MOUTH AND THROAT WITH OR WITHOUT PRESENCE OF ULCERS  *URINARY PROBLEMS  *BOWEL PROBLEMS  UNUSUAL RASH Items with * indicate a potential emergency and should be followed up as soon as possible.  Feel free to call the clinic you have any questions or concerns. The clinic phone number is [phone].  Please show the [place] at check-in to the Emergency Department and triage nurse.

## 2016-06-23 ENCOUNTER — Telehealth: Payer: Self-pay | Admitting: Nurse Practitioner

## 2016-06-23 ENCOUNTER — Other Ambulatory Visit: Payer: Self-pay | Admitting: Nurse Practitioner

## 2016-06-23 DIAGNOSIS — N390 Urinary tract infection, site not specified: Secondary | ICD-10-CM

## 2016-06-23 DIAGNOSIS — C155 Malignant neoplasm of lower third of esophagus: Secondary | ICD-10-CM

## 2016-06-23 LAB — URINE CULTURE: ORGANISM ID, BACTERIA: NO GROWTH

## 2016-06-23 MED ORDER — CIPROFLOXACIN HCL 500 MG PO TABS: 500.0000 mg | ORAL_TABLET | Freq: Two times a day (BID) | ORAL | Status: DC

## 2016-06-23 NOTE — Telephone Encounter (Signed)
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

## 2016-06-27 ENCOUNTER — Telehealth: Payer: Self-pay | Admitting: *Deleted

## 2016-06-27 NOTE — Telephone Encounter (Signed)
-----   Message from Bevelyn Ngo, LPN sent at 04/11/7740  9:17 AM EDT -----   ----- Message -----    From: Owens Shark, NP    Sent: 06/23/2016   3:23 PM      To: Bevelyn Ngo, LPN  Please let him I am sending a prescription to his pharmacy for ciprofloxacin for treatment of a UTI.

## 2016-06-27 NOTE — Telephone Encounter (Signed)
Spoke to patient's wife- he has only a few days left of medication and the symptoms are getting better.

## 2016-06-29 ENCOUNTER — Other Ambulatory Visit (HOSPITAL_BASED_OUTPATIENT_CLINIC_OR_DEPARTMENT_OTHER): Payer: BLUE CROSS/BLUE SHIELD

## 2016-06-29 ENCOUNTER — Ambulatory Visit (HOSPITAL_BASED_OUTPATIENT_CLINIC_OR_DEPARTMENT_OTHER): Payer: BLUE CROSS/BLUE SHIELD

## 2016-06-29 ENCOUNTER — Ambulatory Visit: Payer: BLUE CROSS/BLUE SHIELD

## 2016-06-29 VITALS — BP 108/72 | HR 89 | Temp 98.0°F | Resp 18

## 2016-06-29 DIAGNOSIS — Z5111 Encounter for antineoplastic chemotherapy: Secondary | ICD-10-CM | POA: Diagnosis not present

## 2016-06-29 DIAGNOSIS — C158 Malignant neoplasm of overlapping sites of esophagus: Secondary | ICD-10-CM

## 2016-06-29 DIAGNOSIS — C771 Secondary and unspecified malignant neoplasm of intrathoracic lymph nodes: Secondary | ICD-10-CM | POA: Diagnosis not present

## 2016-06-29 DIAGNOSIS — C155 Malignant neoplasm of lower third of esophagus: Secondary | ICD-10-CM

## 2016-06-29 LAB — CBC WITH DIFFERENTIAL/PLATELET
BASO%: 2.4 % — AB (ref 0.0–2.0)
Basophils Absolute: 0.1 10*3/uL (ref 0.0–0.1)
EOS%: 9.3 % — AB (ref 0.0–7.0)
Eosinophils Absolute: 0.4 10*3/uL (ref 0.0–0.5)
HCT: 32.9 % — ABNORMAL LOW (ref 38.4–49.9)
HGB: 10.5 g/dL — ABNORMAL LOW (ref 13.0–17.1)
LYMPH%: 22.4 % (ref 14.0–49.0)
MCH: 27.6 pg (ref 27.2–33.4)
MCHC: 31.9 g/dL — AB (ref 32.0–36.0)
MCV: 86.4 fL (ref 79.3–98.0)
MONO#: 0.3 10*3/uL (ref 0.1–0.9)
MONO%: 6.1 % (ref 0.0–14.0)
NEUT#: 2.5 10*3/uL (ref 1.5–6.5)
NEUT%: 59.8 % (ref 39.0–75.0)
PLATELETS: 167 10*3/uL (ref 140–400)
RBC: 3.81 10*6/uL — AB (ref 4.20–5.82)
RDW: 18.2 % — ABNORMAL HIGH (ref 11.0–14.6)
WBC: 4.1 10*3/uL (ref 4.0–10.3)
lymph#: 0.9 10*3/uL (ref 0.9–3.3)

## 2016-06-29 LAB — COMPREHENSIVE METABOLIC PANEL
ALBUMIN: 3.2 g/dL — AB (ref 3.5–5.0)
ALK PHOS: 88 U/L (ref 40–150)
ALT: 10 U/L (ref 0–55)
AST: 14 U/L (ref 5–34)
Anion Gap: 8 mEq/L (ref 3–11)
BILIRUBIN TOTAL: 0.37 mg/dL (ref 0.20–1.20)
BUN: 9.1 mg/dL (ref 7.0–26.0)
CO2: 26 mEq/L (ref 22–29)
Calcium: 8.9 mg/dL (ref 8.4–10.4)
Chloride: 108 mEq/L (ref 98–109)
Creatinine: 0.7 mg/dL (ref 0.7–1.3)
EGFR: >90 mL/min/{1.73_m2} (ref >90)
GLUCOSE: 77 mg/dL (ref 70–140)
Potassium: 4.3 mEq/L (ref 3.5–5.1)
SODIUM: 142 meq/L (ref 136–145)
TOTAL PROTEIN: 7 g/dL (ref 6.4–8.3)

## 2016-06-29 MED ORDER — DEXAMETHASONE SODIUM PHOSPHATE 100 MG/10ML IJ SOLN
10.0000 mg | Freq: Once | INTRAMUSCULAR | Status: AC
  Administered 2016-06-29: 10 mg via INTRAVENOUS
  Filled 2016-06-29: qty 1

## 2016-06-29 MED ORDER — FAMOTIDINE IN NACL 20-0.9 MG/50ML-% IV SOLN
20.0000 mg | Freq: Once | INTRAVENOUS | Status: AC
  Administered 2016-06-29: 20 mg via INTRAVENOUS

## 2016-06-29 MED ORDER — SODIUM CHLORIDE 0.9% FLUSH
10.0000 mL | INTRAVENOUS | Status: DC | PRN
  Administered 2016-06-29: 10 mL
  Filled 2016-06-29: qty 10

## 2016-06-29 MED ORDER — FAMOTIDINE IN NACL 20-0.9 MG/50ML-% IV SOLN
INTRAVENOUS | Status: AC
  Filled 2016-06-29: qty 50

## 2016-06-29 MED ORDER — SODIUM CHLORIDE 0.9 % IV SOLN
80.0000 mg/m2 | Freq: Once | INTRAVENOUS | Status: AC
  Administered 2016-06-29: 162 mg via INTRAVENOUS
  Filled 2016-06-29: qty 27

## 2016-06-29 MED ORDER — SODIUM CHLORIDE 0.9 % IV SOLN
Freq: Once | INTRAVENOUS | Status: AC
  Administered 2016-06-29: 14:00:00 via INTRAVENOUS

## 2016-06-29 MED ORDER — HEPARIN SOD (PORK) LOCK FLUSH 100 UNIT/ML IV SOLN
500.0000 [IU] | Freq: Once | INTRAVENOUS | Status: AC | PRN
Start: 2016-06-29 — End: 2016-06-29
  Administered 2016-06-29: 500 [IU]
  Filled 2016-06-29: qty 5

## 2016-06-29 MED ORDER — DIPHENHYDRAMINE HCL 50 MG/ML IJ SOLN
INTRAMUSCULAR | Status: AC
  Filled 2016-06-29: qty 1

## 2016-06-29 MED ORDER — DIPHENHYDRAMINE HCL 50 MG/ML IJ SOLN
25.0000 mg | Freq: Once | INTRAMUSCULAR | Status: AC
  Administered 2016-06-29: 25 mg via INTRAVENOUS

## 2016-06-29 NOTE — Patient Instructions (Signed)
[facility] Discharge Instructions for Patients Receiving Chemotherapy  Today you received the following chemotherapy agents Taxol   To help prevent nausea and vomiting after your treatment, we encourage you to take your nausea medication as directed.   If you develop nausea and vomiting that is not controlled by your nausea medication, call the clinic.   BELOW ARE SYMPTOMS THAT SHOULD BE REPORTED IMMEDIATELY:  *FEVER GREATER THAN 100.5 F  *CHILLS WITH OR WITHOUT FEVER  NAUSEA AND VOMITING THAT IS NOT CONTROLLED WITH YOUR NAUSEA MEDICATION  *UNUSUAL SHORTNESS OF BREATH  *UNUSUAL BRUISING OR BLEEDING  TENDERNESS IN MOUTH AND THROAT WITH OR WITHOUT PRESENCE OF ULCERS  *URINARY PROBLEMS  *BOWEL PROBLEMS  UNUSUAL RASH Items with * indicate a potential emergency and should be followed up as soon as possible.  Feel free to call the clinic you have any questions or concerns. The clinic phone number is [phone].  Please show the CHEMO ALERT CARD at check-in to the Emergency Department and triage nurse.   

## 2016-07-02 ENCOUNTER — Other Ambulatory Visit: Payer: Self-pay | Admitting: Oncology

## 2016-07-04 ENCOUNTER — Ambulatory Visit (HOSPITAL_COMMUNITY)
Admission: RE | Admit: 2016-07-04 | Payer: BLUE CROSS/BLUE SHIELD | Source: Ambulatory Visit | Admitting: Internal Medicine

## 2016-07-04 ENCOUNTER — Encounter (HOSPITAL_COMMUNITY): Admission: RE | Payer: Self-pay | Source: Ambulatory Visit

## 2016-07-04 SURGERY — EGD (ESOPHAGOGASTRODUODENOSCOPY)
Anesthesia: Monitor Anesthesia Care

## 2016-07-06 ENCOUNTER — Telehealth: Payer: Self-pay | Admitting: *Deleted

## 2016-07-06 ENCOUNTER — Ambulatory Visit (HOSPITAL_COMMUNITY)
Admission: RE | Admit: 2016-07-06 | Discharge: 2016-07-06 | Disposition: A | Discharge status: Home / Self Care | Payer: BLUE CROSS/BLUE SHIELD | Source: Ambulatory Visit | Attending: Oncology | Admitting: Oncology

## 2016-07-06 ENCOUNTER — Ambulatory Visit (HOSPITAL_BASED_OUTPATIENT_CLINIC_OR_DEPARTMENT_OTHER): Payer: BLUE CROSS/BLUE SHIELD

## 2016-07-06 ENCOUNTER — Other Ambulatory Visit: Payer: Self-pay | Admitting: Oncology

## 2016-07-06 ENCOUNTER — Other Ambulatory Visit: Payer: Self-pay | Admitting: *Deleted

## 2016-07-06 ENCOUNTER — Telehealth: Payer: Self-pay | Admitting: Oncology

## 2016-07-06 ENCOUNTER — Other Ambulatory Visit (HOSPITAL_BASED_OUTPATIENT_CLINIC_OR_DEPARTMENT_OTHER): Payer: BLUE CROSS/BLUE SHIELD

## 2016-07-06 ENCOUNTER — Ambulatory Visit (HOSPITAL_BASED_OUTPATIENT_CLINIC_OR_DEPARTMENT_OTHER): Payer: BLUE CROSS/BLUE SHIELD | Admitting: Oncology

## 2016-07-06 ENCOUNTER — Ambulatory Visit: Payer: BLUE CROSS/BLUE SHIELD

## 2016-07-06 VITALS — Temp 101.8°F

## 2016-07-06 VITALS — BP 136/88 | HR 108 | Temp 99.9°F | Resp 19 | Ht 72.0 in | Wt 186.5 lb

## 2016-07-06 DIAGNOSIS — R918 Other nonspecific abnormal finding of lung field: Secondary | ICD-10-CM | POA: Diagnosis not present

## 2016-07-06 DIAGNOSIS — C155 Malignant neoplasm of lower third of esophagus: Secondary | ICD-10-CM

## 2016-07-06 DIAGNOSIS — D696 Thrombocytopenia, unspecified: Secondary | ICD-10-CM

## 2016-07-06 DIAGNOSIS — R509 Fever, unspecified: Secondary | ICD-10-CM

## 2016-07-06 DIAGNOSIS — Z95828 Presence of other vascular implants and grafts: Secondary | ICD-10-CM

## 2016-07-06 LAB — CBC WITH DIFFERENTIAL/PLATELET
BASO%: 1.1 % (ref 0.0–2.0)
Basophils Absolute: 0 10*3/uL (ref 0.0–0.1)
EOS ABS: 0.2 10*3/uL (ref 0.0–0.5)
EOS%: 5.9 % (ref 0.0–7.0)
HCT: 32.7 % — ABNORMAL LOW (ref 38.4–49.9)
HEMOGLOBIN: 10.7 g/dL — AB (ref 13.0–17.1)
LYMPH%: 14.5 % (ref 14.0–49.0)
MCH: 28.4 pg (ref 27.2–33.4)
MCHC: 32.7 g/dL (ref 32.0–36.0)
MCV: 86.7 fL (ref 79.3–98.0)
MONO#: 0.2 10*3/uL (ref 0.1–0.9)
MONO%: 6.2 % (ref 0.0–14.0)
NEUT%: 72.3 % (ref 39.0–75.0)
NEUTROS ABS: 2.7 10*3/uL (ref 1.5–6.5)
PLATELETS: 164 10*3/uL (ref 140–400)
RBC: 3.77 10*6/uL — AB (ref 4.20–5.82)
RDW: 19 % — ABNORMAL HIGH (ref 11.0–14.6)
WBC: 3.7 10*3/uL — AB (ref 4.0–10.3)
lymph#: 0.5 10*3/uL — ABNORMAL LOW (ref 0.9–3.3)

## 2016-07-06 MED ORDER — MIRTAZAPINE 15 MG PO TABS: 15.0000 mg | ORAL_TABLET | Freq: Every day | ORAL | 1 refills | Status: DC

## 2016-07-06 MED ORDER — AMOXICILLIN-POT CLAVULANATE 500-125 MG PO TABS: 500.0000 mg | ORAL_TABLET | Freq: Two times a day (BID) | ORAL | 0 refills | Status: DC

## 2016-07-06 MED ORDER — ALPRAZOLAM 0.5 MG PO TABS: 0.5000 mg | ORAL_TABLET | Freq: Three times a day (TID) | ORAL | 0 refills | Status: DC | PRN

## 2016-07-06 MED ORDER — BENZONATATE 100 MG PO CAPS: 100.0000 mg | ORAL_CAPSULE | Freq: Three times a day (TID) | ORAL | 1 refills | Status: DC | PRN

## 2016-07-06 MED ORDER — ACETAMINOPHEN 325 MG PO TABS: 650.0000 mg | ORAL_TABLET | Freq: Once | ORAL | Status: DC

## 2016-07-06 MED ORDER — SODIUM CHLORIDE 0.9 % IJ SOLN
10.0000 mL | INTRAMUSCULAR | Status: DC | PRN
  Administered 2016-07-06: 10 mL via INTRAVENOUS
  Filled 2016-07-06: qty 10

## 2016-07-06 MED FILL — BENZONATATE 100 MG CAPSULE: 100 | 30 days supply | Qty: 90 | Fill #0

## 2016-07-06 MED FILL — ALPRAZolam 0.5 MG TABS: 0.5 | 20 days supply | Qty: 60 | Fill #0

## 2016-07-06 MED FILL — AMOX-CLAV 500-125 MG TABLET: 500-125 | 7 days supply | Qty: 14 | Fill #0

## 2016-07-06 MED FILL — MIRTAZAPINE 15 MG TABLET: 15 | 30 days supply | Qty: 30 | Fill #0

## 2016-07-06 NOTE — Progress Notes (Signed)
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 He reports feeling well. He has chills this morning. He had mucous plugging in the tracheostomy last night. No fever at home. He had dysuria 2 weeks ago. A urine culture was negative. He was treated with ciprofloxacin. The dysuria has resolved. He reports a good appetite. He has traveled to [place].   Objective:  Vital signs in last 24 hours:  Blood pressure 136/88, pulse (!) 108, temperature 99.9 F (37.7 C), temperature source Oral, resp. rate 19, height 6' (1.829 m), weight 186 lb 8 oz (84.6 kg), SpO2 96 %.    HEENT:  no thrush or ulcers Resp:  lungs clear bilaterally, no respiratory distress Cardio:  regular rate and rhythm, tachycardia GI:  no hepatomegaly, nontender Vascular:  no leg edema Skin:Mild erythematous rash over the forehead. The skin is warm.   Portacath/PICC-without erythema  Lab Results:  Lab Results  Component Value Date   WBC 3.7 (L) 07/06/2016   HGB 10.7 (L) 07/06/2016   HCT 32.7 (L) 07/06/2016   MCV 86.7 07/06/2016   PLT 164 07/06/2016   NEUTROABS 2.7 07/06/2016     Lab Results  Component Value Date   CEA1 1,951.0 (H) 05/12/2016     Medications: I have reviewed the patient's current medications.  Assessment/Plan: 1. Adenocarcinoma of the distal esophagus/gastric cardia-status post an endoscopic biopsy on 03/18/2013 confirming adenocarcinoma, HER-2/neu not amplified  -Staging CT scans 03/20/2013 confirmed a distal esophageal/gastric cardia mass, distal paraesophageal lymph nodes and paratracheal nodes concerning for metastatic lymphadenopathy  -A PET scan 03/27/2013 with a multifocal area of hypermetabolic activity involving the thoracic esophagus extending into the gastric cardia and hypermetabolic mediastinal nodes, no distant metastatic disease  -Initiation of radiation 04/07/2013  and weekly Taxol/carboplatin 04/08/2013, radiation completed 05/15/2013  -Restaging PET scan 06/09/2013-no evidence of metastatic disease, no hypermetabolic lymphadenopathy  -Esophagogastrectomy 07/07/2013 revealed a ypT1b,ypN0 tumor with negative surgical margins  -CT neck 06/09/2014 revealed a necrotic lymph node in the right tracheoesophageal groove  -PET scan 11/15/2014 revealed a single focus of hypermetabolism corresponding to the necrotic right upper mediastinum lymph node -EUS biopsy of the paraesophageal lymph node 06/25/2012 confirmed metastatic adenocarcinoma  -Radiation completed 08/11/2014 -Rising CEA May 2016 -PET scan 07/13/2015 with hypermetabolic right paratracheal and right hilar adenopathy -Cycle 1 FOLFOX 08/03/2015 -Cycle 2 FOLFOX 08/17/2015 - cycle 3 FOLFOX 08/31/2015 (oxaliplatin dose reduced secondary to thrombocytopenia)  -Cycle 4 FOLFOX 09/14/2015 -Cycle 5 FOLFOX 09/28/2015 - restaging CTs 09/30/2015 with stable disease involving neck/chest lymph nodes, a slight increase in size of left upper lobe lung nodule  - cycle 6 FOLFOX 10/12/2015  -FOLFOX discontinued secondary to development of bilateral vocal cord paralysis November 2016 - CT scan of the neck and chest on 11/22/15 revealed no definite disease in the neck. At the thoracic inlet region, there is questionably slight increase in prominence of soft tissue at the right tracheoesophageal groove. This could be an enlarging node or mass but could also relate to the esophagectomy and pull-through surgery.Enlarging lingular and left lower lobe nodules are worrisome for metastatic disease. Peribronchovascular nodular consolidation in the anterior right lower lobe, slightly more prominent, indeterminate. Cholelithiasis. Left diaphragmatic hernia, stable. -Xeloda 1 week on/1 week off beginning 12/11/2015 -Restaging CTs of the neck and chest 02/07/2016 revealed enlargement of 2 left-sided lung nodules, no other  evidence of disease progression -Cycle 1 Pembrolizumab 02/25/2016  -Cycle 2 Pembrolizumab 03/17/2016 -  Cycle 3 Pembrolizumab 04/07/2016   -CT 04/20/2016 consistent with progressive disease involving a large pericardial effusion, bilateral pleural effusions, enlarged mediastinal lymph nodes and lung nodules  Cytology from the pericardial fluid 04/21/2016-positive for metastatic adenocarcinoma -CT 05/01/2016 was slight worsening of right hilar lymphadenopathy with near complete collapse of the right middle lobe, nodular metastases in the left lung, stable bilateral pleural effusions  Cycle 1 Taxol/ramucirumab 05/12/2016; day 8 Taxol 05/19/2016; day 15 Taxol/ramucirumab 05/26/2016.  Cycle 2 day 1 Taxol/ramucirumab 06/22/2016  Cycle 2 day 8 Taxol 06/29/2016   2. Indeterminate 15 mm right liver lesion on the CT scan 03/20/2013, MRI of the liver 06/09/2013 confirmed a right liver hemangioma  3. Anorexia/weight loss and solid dysphagia secondary to #1 -improved 4. History of a colon polyp, status post a polypectomy October 2013  5. history of Thrombocytopenia secondary chemotherapy-the carboplatin wase dose reduced with week 5 of chemotherapy chemotherapy  6. hoarseness secondary to right vocal cord paralysis-CT/PET scan imaging consistent with a malignant right paratracheal lymph node causing the vocal cord paralysis  -Status post a laryngeal plasty at [facility] on 09/02/2014 7. Esophageal stricture-status post dilation 06/08/2015 -Food impaction removal 09/29/2015 -Esophageal stricture dilation 10/06/2015 8. Port-A-Cath placement 07/20/2015 9. Delayed nausea following cycle 1 FOLFOX. Aloxi and Emend added with cycle 2. 10. Thrombocytopenia secondary to chemotherapy  11. Emotional lability/depression-prophylactic Decadron discontinued; Remeron initiated 12. Progressive hoarseness November 2016, diagnosed with bilateral vocal cord paralysis, status post a tracheostomy and bilateral  injection laryngoplasty at [facility] on 10/27/2015 13. Pericardial window 04/21/2016  14. Fever/chills-no apparent source for infection on exam today.     Disposition:   His overall performance status has improved. The plan is to continue Taxol/ramucirumab. He has a fever and chills today. We will delay chemotherapy until 07/07/2016. We will check a blood culture from the Port-A-Cath and a chest x-ray today. He will return for an office visit on 07/07/2016. He will begin Augmentin.  The plan is to obtain a restaging chest CT after 3 cycles of systemic therapy.  Betsy Coder, MD  07/06/2016  1:23 PM

## 2016-07-06 NOTE — Telephone Encounter (Signed)
Per staff phone call and POF I have schedueld appts. Scheduler advised of appts.  JMW  

## 2016-07-06 NOTE — Progress Notes (Signed)
Pt.'s temp re-checked x2, 99.7 and 99.3.  Dr. Benay Spice notified and order received to give pt Tylenol 650 mg PO in infusion room and to instruct pt to call if fevers/chills persist over 100.4.  Pt instructed of MD instructions and verbalizes an understanding of information.

## 2016-07-06 NOTE — Progress Notes (Signed)
Pt's temp rechecked =101.8.  Discussed with Lorriane Shire RN who spoke with Dr Benay Spice & explained to pt that we will not do treatment today & draw blood culture from port & send for CXR.  An ATB was also sent to his pharmacy & was reminded to p/u.  Port site redressed & Bio patch added & sent pt to Radiology.  Pt wanted to leave port accessed for tomorrow.  Tylenol not given-Called pt at home & he had taken tylenol when he got home.

## 2016-07-06 NOTE — Progress Notes (Signed)
Pt now with temp of 101.8 in infusion room.  Per Dr. Benay Spice, hold treatment today, Draw a set of blood cultures via port, send pt for CXR, begin Augmentin 500 mg PO BID x 7 days and schedule pt to see Dr. Benay Spice tomorrow, 07/07/16 at 12:30PM with infusion after MD visit.  Will notify M. Jaci Standard of MD instructions.

## 2016-07-06 NOTE — Telephone Encounter (Signed)
spoke w/ pt wife confirmed 7/28 apt times

## 2016-07-06 NOTE — Telephone Encounter (Signed)
Gave pt cal & avs °

## 2016-07-07 ENCOUNTER — Ambulatory Visit (HOSPITAL_BASED_OUTPATIENT_CLINIC_OR_DEPARTMENT_OTHER): Payer: BLUE CROSS/BLUE SHIELD

## 2016-07-07 ENCOUNTER — Ambulatory Visit (HOSPITAL_BASED_OUTPATIENT_CLINIC_OR_DEPARTMENT_OTHER): Payer: BLUE CROSS/BLUE SHIELD | Admitting: Oncology

## 2016-07-07 VITALS — BP 124/78 | HR 105 | Temp 98.0°F | Resp 17 | Ht 72.0 in | Wt 186.0 lb

## 2016-07-07 DIAGNOSIS — Z5111 Encounter for antineoplastic chemotherapy: Secondary | ICD-10-CM | POA: Diagnosis not present

## 2016-07-07 DIAGNOSIS — R630 Anorexia: Secondary | ICD-10-CM

## 2016-07-07 DIAGNOSIS — C155 Malignant neoplasm of lower third of esophagus: Secondary | ICD-10-CM

## 2016-07-07 DIAGNOSIS — D696 Thrombocytopenia, unspecified: Secondary | ICD-10-CM

## 2016-07-07 DIAGNOSIS — R634 Abnormal weight loss: Secondary | ICD-10-CM | POA: Diagnosis not present

## 2016-07-07 DIAGNOSIS — Z5112 Encounter for antineoplastic immunotherapy: Secondary | ICD-10-CM | POA: Diagnosis not present

## 2016-07-07 LAB — CEA: CEA1: 475.3 ng/mL — AB (ref 0.0–4.7)

## 2016-07-07 MED ORDER — SODIUM CHLORIDE 0.9% FLUSH
10.0000 mL | INTRAVENOUS | Status: DC | PRN
  Administered 2016-07-07: 10 mL
  Filled 2016-07-07: qty 10

## 2016-07-07 MED ORDER — DIPHENHYDRAMINE HCL 50 MG/ML IJ SOLN
INTRAMUSCULAR | Status: AC
  Filled 2016-07-07: qty 1

## 2016-07-07 MED ORDER — HEPARIN SOD (PORK) LOCK FLUSH 100 UNIT/ML IV SOLN
500.0000 [IU] | Freq: Once | INTRAVENOUS | Status: AC | PRN
  Administered 2016-07-07: 500 [IU]
  Filled 2016-07-07: qty 5

## 2016-07-07 MED ORDER — SODIUM CHLORIDE 0.9 % IV SOLN
8.0000 mg/kg | Freq: Once | INTRAVENOUS | Status: AC
  Administered 2016-07-07: 600 mg via INTRAVENOUS
  Filled 2016-07-07: qty 50

## 2016-07-07 MED ORDER — FAMOTIDINE IN NACL 20-0.9 MG/50ML-% IV SOLN
INTRAVENOUS | Status: AC
  Filled 2016-07-07: qty 50

## 2016-07-07 MED ORDER — DIPHENHYDRAMINE HCL 50 MG/ML IJ SOLN
25.0000 mg | Freq: Once | INTRAMUSCULAR | Status: AC
  Administered 2016-07-07: 25 mg via INTRAVENOUS

## 2016-07-07 MED ORDER — SODIUM CHLORIDE 0.9 % IV SOLN
10.0000 mg | Freq: Once | INTRAVENOUS | Status: AC
  Administered 2016-07-07: 10 mg via INTRAVENOUS
  Filled 2016-07-07: qty 1

## 2016-07-07 MED ORDER — FAMOTIDINE IN NACL 20-0.9 MG/50ML-% IV SOLN
20.0000 mg | Freq: Once | INTRAVENOUS | Status: AC
  Administered 2016-07-07: 20 mg via INTRAVENOUS

## 2016-07-07 MED ORDER — SODIUM CHLORIDE 0.9 % IV SOLN
80.0000 mg/m2 | Freq: Once | INTRAVENOUS | Status: AC
  Administered 2016-07-07: 162 mg via INTRAVENOUS
  Filled 2016-07-07: qty 27

## 2016-07-07 MED ORDER — ACETAMINOPHEN 160 MG/5ML PO SOLN
650.0000 mg | Freq: Once | ORAL | Status: AC
  Administered 2016-07-07: 650 mg via ORAL
  Filled 2016-07-07: qty 20.3

## 2016-07-07 MED ORDER — SODIUM CHLORIDE 0.9 % IV SOLN
Freq: Once | INTRAVENOUS | Status: AC
  Administered 2016-07-07: 14:00:00 via INTRAVENOUS

## 2016-07-07 MED ORDER — ACETAMINOPHEN 325 MG PO TABS
ORAL_TABLET | ORAL | Status: AC
  Filled 2016-07-07: qty 2

## 2016-07-07 NOTE — Patient Instructions (Signed)
[facility] Discharge Instructions for Patients Receiving Chemotherapy  Today you received the following chemotherapy agents:  Cyramza & Taxol  To help prevent nausea and vomiting after your treatment, we encourage you to take your nausea medication.   If you develop nausea and vomiting that is not controlled by your nausea medication, call the clinic.   BELOW ARE SYMPTOMS THAT SHOULD BE REPORTED IMMEDIATELY:  *FEVER GREATER THAN 100.5 F  *CHILLS WITH OR WITHOUT FEVER  NAUSEA AND VOMITING THAT IS NOT CONTROLLED WITH YOUR NAUSEA MEDICATION  *UNUSUAL SHORTNESS OF BREATH  *UNUSUAL BRUISING OR BLEEDING  TENDERNESS IN MOUTH AND THROAT WITH OR WITHOUT PRESENCE OF ULCERS  *URINARY PROBLEMS  *BOWEL PROBLEMS  UNUSUAL RASH Items with * indicate a potential emergency and should be followed up as soon as possible.  Feel free to call the clinic you have any questions or concerns. The clinic phone number is [phone].  Please show the [facility] at check-in to the Emergency Department and triage nurse.

## 2016-07-07 NOTE — Progress Notes (Signed)
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 Chemotherapy was held. A chest x-ray questioned left lower lung pneumonia. He was placed on Augmentin. He denies recurrent fever. He feels well today. He had an episode of "choking "during the night . He feels well today.   Objective:  Vital signs in last 24 hours:  Blood pressure 124/78, pulse (!) 105, temperature 98 F (36.7 C), temperature source Oral, resp. rate 17, height 6' (1.829 m), weight 186 lb (84.4 kg), SpO2 97 %.    HEENT:  pharynx without erythema, no thrush Resp:  coarse rhonchi at the left posterior base, no respiratory distress, mild erythema surrounding the tracheostomy site Cardio:  regular rate and rhythm GI:  no hepatomegaly, nontender Vascular:  no leg edema  Portacath/PICC-without erythema  Lab Results:  Lab Results  Component Value Date   WBC 3.7 (L) 07/06/2016   HGB 10.7 (L) 07/06/2016   HCT 32.7 (L) 07/06/2016   MCV 86.7 07/06/2016   PLT 164 07/06/2016   NEUTROABS 2.7 07/06/2016      Lab Results  Component Value Date   CEA1 475.3 (H) 07/06/2016    Imaging:  Dg Chest 2 View  Result Date: 07/06/2016 CLINICAL DATA:  Esophageal cancer.  Fever. EXAM: CHEST  2 VIEW COMPARISON:  05/09/2016. FINDINGS: Tracheostomy tube again noted. Right Port-A-Cath tip overlies the distal SVC. Interval decrease in right infrahilar airspace opacity with slight interval increase in atelectasis or infiltrate in the posterior left lung base. No edema or substantial pleural effusion. The cardiopericardial silhouette is within normal limits for size. The visualized bony structures of the thorax are intact. IMPRESSION: Interval decrease in right infrahilar opacity with worsening collapse/ consolidation in the posterior left lower lobe. Electronically Signed   By: [name] M.D.   On: 07/06/2016  14:54   Medications: I have reviewed the patient's current medications.  Assessment/Plan: 1. Adenocarcinoma of the distal esophagus/gastric cardia-status post an endoscopic biopsy on 03/18/2013 confirming adenocarcinoma, HER-2/neunot amplified  -Staging CT scans 03/20/2013 confirmed a distal esophageal/gastric cardia mass, distal paraesophageal lymph nodes and paratracheal nodes concerning for metastatic lymphadenopathy  -A PET scan 03/27/2013 with a multifocal area of hypermetabolic activity involving the thoracic esophagus extending into the gastric cardia and hypermetabolic mediastinal nodes, no distant metastatic disease  -Initiation of radiation 04/07/2013 and weekly Taxol/carboplatin 04/08/2013, radiation completed 05/15/2013  -Restaging PET scan 06/09/2013-no evidence of metastatic disease, no hypermetabolic lymphadenopathy  -Esophagogastrectomy 07/07/2013 revealed a ypT1b,ypN0 tumor with negative surgical margins  -CT neck 06/09/2014 revealed a necrotic lymph node in the right tracheoesophageal groove  -PET scan 11/15/2014 revealed a single focus of hypermetabolism corresponding to the necrotic right upper mediastinum lymph node -EUS biopsy of the paraesophageal lymph node 06/25/2012 confirmed metastatic adenocarcinoma  -Radiation completed 08/11/2014 -Rising CEA May 2016 -PET scan 07/13/2015 with hypermetabolic right paratracheal and right hilar adenopathy -Cycle 1 FOLFOX 08/03/2015 -Cycle 2 FOLFOX 08/17/2015 - cycle 3 FOLFOX 08/31/2015 (oxaliplatin dose reduced secondary to thrombocytopenia)  -Cycle 4 FOLFOX 09/14/2015 -Cycle 5 FOLFOX 09/28/2015 - restaging CTs 09/30/2015 with stable disease involving neck/chest lymph nodes, a slight increase in size of left upper lobe lung nodule  - cycle 6 FOLFOX 10/12/2015  -FOLFOX discontinued secondary to development of bilateral vocal cord paralysis November 2016 - CT scan of the neck and chest on 11/22/15 revealed no definite  disease in the neck. At the  thoracic inlet region, there is questionably slight increase in prominence of soft tissue at the right tracheoesophageal groove. This could be an enlarging node or mass but could also relate to the esophagectomy and pull-through surgery.Enlarging lingular and left lower lobe nodules are worrisome for metastatic disease. Peribronchovascular nodular consolidation in the anterior right lower lobe, slightly more prominent, indeterminate. Cholelithiasis. Left diaphragmatic hernia, stable. -Xeloda 1 week on/1 week off beginning 12/11/2015 -Restaging CTs of the neck and chest 02/07/2016 revealed enlargement of 2 left-sided lung nodules, no other evidence of disease progression -Cycle 1 Pembrolizumab 02/25/2016  -Cycle 2 Pembrolizumab 03/17/2016 -Cycle 3 Pembrolizumab 04/07/2016   -CT 04/20/2016 consistent with progressive disease involving a large pericardial effusion, bilateral pleural effusions, enlarged mediastinal lymph nodes and lung nodules  Cytology from the pericardial fluid 04/21/2016-positive for metastatic adenocarcinoma -CT 05/01/2016 was slight worsening of right hilar lymphadenopathy with near complete collapse of the right middle lobe, nodular metastases in the left lung, stable bilateral pleural effusions  Cycle 1 Taxol/ramucirumab 05/12/2016; day 8 Taxol 05/19/2016; day 15 Taxol/ramucirumab 05/26/2016.  Cycle 2 day 1 Taxol/ramucirumab 06/22/2016  Cycle 2 day 8 Taxol 06/29/2016   Cycle 2 day 15 Taxol/ramucirumab 07/07/2016  2. Indeterminate 15 mm right liver lesion on the CT scan 03/20/2013, MRI of the liver 06/09/2013 confirmed a right liver hemangioma  3. Anorexia/weight loss and solid dysphagia secondary to #1 -improved 4. History of a colon polyp, status post a polypectomy October 2013  5. history of Thrombocytopenia secondary chemotherapy-the carboplatin wase dose reduced with week 5 of chemotherapy chemotherapy  6. hoarseness secondary to  right vocal cord paralysis-CT/PET scan imaging consistent with a malignant right paratracheal lymph node causing the vocal cord paralysis  -Status post a laryngeal plasty at [facility] on 09/02/2014 7. Esophageal stricture-status post dilation 06/08/2015 -Food impaction removal 09/29/2015 -Esophageal stricture dilation 10/06/2015 8. Port-A-Cath placement 07/20/2015 9. Delayed nausea following cycle 1 FOLFOX. Aloxi and Emend added with cycle 2. 10. Thrombocytopenia secondary to chemotherapy  11. Emotional lability/depression-prophylactic Decadron discontinued; Remeron initiated 12. Progressive hoarseness November 2016, diagnosed with bilateral vocal cord paralysis, status post a tracheostomy and bilateral injection laryngoplasty at [facility] on 10/27/2015 13. Pericardial window 04/21/2016  14. 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 The fever has resolved. A blood culture from 07/06/2016 is negative to date. He does not have symptoms to suggest pneumonia, but it is possible the fever was related to pneumonia. He will complete the course of Augmentin.  He is scheduled for an appointment in the tracheostomy clinic next week.  The plan is to proceed with Taxol/ramucirumab today. He will return for an office visit and chemotherapy in 2 weeks. The CEA is significantly lower after 2 cycles of Taxol/ramucirumab.   07/07/2016  1:07 PM

## 2016-07-10 ENCOUNTER — Ambulatory Visit: Payer: BLUE CROSS/BLUE SHIELD | Admitting: Oncology

## 2016-07-10 ENCOUNTER — Telehealth: Payer: Self-pay | Admitting: *Deleted

## 2016-07-10 NOTE — Telephone Encounter (Signed)
Message from pt requesting clarification of appts. He is scheduled for Day 1 Cycle 3 of chemo only. Reviewed with Dr. Benay Spice: Orders entered for Day 8 and 15. Pt requested chest Xray to be released to Mychart, done by Dr. Benay Spice.

## 2016-07-12 ENCOUNTER — Other Ambulatory Visit: Payer: Self-pay | Admitting: *Deleted

## 2016-07-12 LAB — CULTURE, BLOOD (SINGLE)

## 2016-07-12 MED ORDER — ESZOPICLONE 1 MG PO TABS: 1.0000 mg | ORAL_TABLET | Freq: Every evening | ORAL | 0 refills | Status: DC | PRN

## 2016-07-12 MED FILL — SODIUM CHLORIDE 0.9% IRRIG.: 0.9 | 10 days supply | Qty: 2000 | Fill #0

## 2016-07-12 MED FILL — ESZOPICLONE 2 MG TABLET: 2 | 30 days supply | Qty: 15 | Fill #0

## 2016-07-12 NOTE — Telephone Encounter (Signed)
Pt stopped by office after to request refill on sleep aid. He reports he is no longer using Remeron. Requests refill on Lunesta.  Reviewed with Drue Second, NP: OK to refill Lunesta. Rx called to pharmacy.

## 2016-07-16 ENCOUNTER — Other Ambulatory Visit: Payer: Self-pay | Admitting: Oncology

## 2016-07-20 ENCOUNTER — Ambulatory Visit (HOSPITAL_BASED_OUTPATIENT_CLINIC_OR_DEPARTMENT_OTHER): Payer: BLUE CROSS/BLUE SHIELD | Admitting: Nurse Practitioner

## 2016-07-20 ENCOUNTER — Other Ambulatory Visit (HOSPITAL_BASED_OUTPATIENT_CLINIC_OR_DEPARTMENT_OTHER): Payer: BLUE CROSS/BLUE SHIELD

## 2016-07-20 ENCOUNTER — Encounter: Payer: Self-pay | Admitting: *Deleted

## 2016-07-20 ENCOUNTER — Ambulatory Visit: Payer: BLUE CROSS/BLUE SHIELD

## 2016-07-20 ENCOUNTER — Ambulatory Visit (HOSPITAL_BASED_OUTPATIENT_CLINIC_OR_DEPARTMENT_OTHER): Payer: BLUE CROSS/BLUE SHIELD

## 2016-07-20 VITALS — HR 86

## 2016-07-20 VITALS — BP 127/78 | HR 109 | Temp 98.7°F | Resp 17 | Ht 72.0 in | Wt 186.2 lb

## 2016-07-20 DIAGNOSIS — R630 Anorexia: Secondary | ICD-10-CM

## 2016-07-20 DIAGNOSIS — Z95828 Presence of other vascular implants and grafts: Secondary | ICD-10-CM

## 2016-07-20 DIAGNOSIS — C155 Malignant neoplasm of lower third of esophagus: Secondary | ICD-10-CM

## 2016-07-20 DIAGNOSIS — D6959 Other secondary thrombocytopenia: Secondary | ICD-10-CM | POA: Diagnosis not present

## 2016-07-20 DIAGNOSIS — Z5111 Encounter for antineoplastic chemotherapy: Secondary | ICD-10-CM | POA: Diagnosis not present

## 2016-07-20 DIAGNOSIS — R634 Abnormal weight loss: Secondary | ICD-10-CM | POA: Diagnosis not present

## 2016-07-20 LAB — CBC WITH DIFFERENTIAL/PLATELET
BASO%: 0.8 % (ref 0.0–2.0)
Basophils Absolute: 0 10*3/uL (ref 0.0–0.1)
EOS%: 5.1 % (ref 0.0–7.0)
Eosinophils Absolute: 0.2 10*3/uL (ref 0.0–0.5)
HEMATOCRIT: 36.2 % — AB (ref 38.4–49.9)
HEMOGLOBIN: 11.3 g/dL — AB (ref 13.0–17.1)
LYMPH#: 0.6 10*3/uL — AB (ref 0.9–3.3)
LYMPH%: 12 % — ABNORMAL LOW (ref 14.0–49.0)
MCH: 28.3 pg (ref 27.2–33.4)
MCHC: 31.2 g/dL — ABNORMAL LOW (ref 32.0–36.0)
MCV: 90.5 fL (ref 79.3–98.0)
MONO#: 0.7 10*3/uL (ref 0.1–0.9)
MONO%: 13.9 % (ref 0.0–14.0)
NEUT#: 3.2 10*3/uL (ref 1.5–6.5)
NEUT%: 68.2 % (ref 39.0–75.0)
Platelets: 172 10*3/uL (ref 140–400)
RBC: 4 10*6/uL — ABNORMAL LOW (ref 4.20–5.82)
RDW: 19.8 % — AB (ref 11.0–14.6)
WBC: 4.7 10*3/uL (ref 4.0–10.3)

## 2016-07-20 LAB — COMPREHENSIVE METABOLIC PANEL
ALBUMIN: 3.2 g/dL — AB (ref 3.5–5.0)
ALK PHOS: 81 U/L (ref 40–150)
AST: 14 U/L (ref 5–34)
Anion Gap: 9 mEq/L (ref 3–11)
BILIRUBIN TOTAL: 0.39 mg/dL (ref 0.20–1.20)
BUN: 6.5 mg/dL — ABNORMAL LOW (ref 7.0–26.0)
CALCIUM: 9.4 mg/dL (ref 8.4–10.4)
CO2: 23 mEq/L (ref 22–29)
Chloride: 108 mEq/L (ref 98–109)
Creatinine: 0.7 mg/dL (ref 0.7–1.3)
Glucose: 149 mg/dl — ABNORMAL HIGH (ref 70–140)
POTASSIUM: 4.1 meq/L (ref 3.5–5.1)
Sodium: 141 mEq/L (ref 136–145)
Total Protein: 7 g/dL (ref 6.4–8.3)

## 2016-07-20 LAB — CEA (IN HOUSE-CHCC): CEA (CHCC-IN HOUSE): 205.9 ng/mL — AB (ref 0.00–5.00)

## 2016-07-20 MED ORDER — DIPHENHYDRAMINE HCL 50 MG/ML IJ SOLN
INTRAMUSCULAR | Status: AC
  Filled 2016-07-20: qty 1

## 2016-07-20 MED ORDER — ACETAMINOPHEN 325 MG PO TABS
ORAL_TABLET | ORAL | Status: AC
  Filled 2016-07-20: qty 2

## 2016-07-20 MED ORDER — SODIUM CHLORIDE 0.9% FLUSH
10.0000 mL | INTRAVENOUS | Status: DC | PRN
  Administered 2016-07-20: 10 mL
  Filled 2016-07-20: qty 10

## 2016-07-20 MED ORDER — SODIUM CHLORIDE 0.9 % IV SOLN
8.0000 mg/kg | Freq: Once | INTRAVENOUS | Status: AC
  Administered 2016-07-20: 600 mg via INTRAVENOUS
  Filled 2016-07-20: qty 50

## 2016-07-20 MED ORDER — FAMOTIDINE IN NACL 20-0.9 MG/50ML-% IV SOLN
20.0000 mg | Freq: Once | INTRAVENOUS | Status: AC
  Administered 2016-07-20: 20 mg via INTRAVENOUS

## 2016-07-20 MED ORDER — SODIUM CHLORIDE 0.9 % IV SOLN
Freq: Once | INTRAVENOUS | Status: AC
  Administered 2016-07-20: 10:00:00 via INTRAVENOUS

## 2016-07-20 MED ORDER — SODIUM CHLORIDE 0.9 % IV SOLN
10.0000 mg | Freq: Once | INTRAVENOUS | Status: AC
  Administered 2016-07-20: 10 mg via INTRAVENOUS
  Filled 2016-07-20: qty 1

## 2016-07-20 MED ORDER — ACETAMINOPHEN 160 MG/5ML PO SOLN
650.0000 mg | Freq: Once | ORAL | Status: AC
  Administered 2016-07-20: 650 mg via ORAL
  Filled 2016-07-20: qty 20.3

## 2016-07-20 MED ORDER — FAMOTIDINE IN NACL 20-0.9 MG/50ML-% IV SOLN
INTRAVENOUS | Status: AC
  Filled 2016-07-20: qty 50

## 2016-07-20 MED ORDER — SODIUM CHLORIDE 0.9 % IJ SOLN
10.0000 mL | INTRAMUSCULAR | Status: DC | PRN
  Administered 2016-07-20: 10 mL via INTRAVENOUS
  Filled 2016-07-20: qty 10

## 2016-07-20 MED ORDER — HEPARIN SOD (PORK) LOCK FLUSH 100 UNIT/ML IV SOLN
500.0000 [IU] | Freq: Once | INTRAVENOUS | Status: AC | PRN
  Administered 2016-07-20: 500 [IU]
  Filled 2016-07-20: qty 5

## 2016-07-20 MED ORDER — DIPHENHYDRAMINE HCL 50 MG/ML IJ SOLN
25.0000 mg | Freq: Once | INTRAMUSCULAR | Status: AC
  Administered 2016-07-20: 25 mg via INTRAVENOUS

## 2016-07-20 MED ORDER — SODIUM CHLORIDE 0.9 % IV SOLN: Freq: Once | INTRAVENOUS | Status: DC

## 2016-07-20 MED ORDER — PACLITAXEL CHEMO INJECTION 300 MG/50ML
80.0000 mg/m2 | Freq: Once | INTRAVENOUS | Status: AC
  Administered 2016-07-20: 162 mg via INTRAVENOUS
  Filled 2016-07-20: qty 27

## 2016-07-20 NOTE — Progress Notes (Signed)
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 Is sad, but knows it needs to be done. Says his wife and doing well. Denies any navigation needs but appreciates nurse coming to check on him.

## 2016-07-20 NOTE — Patient Instructions (Signed)

## 2016-07-20 NOTE — Progress Notes (Signed)
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 He continues treatment with Taxol/ramucirumab, last treated 07/07/2016. He denies nausea/vomiting. No mouth sores. He had loose stools morning. Bowels overall have been moving regularly. He thinks the loose stools this morning were related to something he ate last night. Breathing is "okay". Continued cough. No fever. Good appetite. Stable numbness/tingling in the fingertips and toes. He describes this as mild. No interference with activity.  Objective:  Vital signs in last 24 hours:  Blood pressure 127/78, pulse (!) 109, temperature 98.7 F (37.1 C), resp. rate 17, SpO2 97 %.    HEENT: No thrush or ulcers. Resp: Lungs clear bilaterally. Cardio: Regular rate and rhythm. GI: Abdomen soft and nontender. No hepatomegaly. Vascular: No leg edema. Neuro: Vibratory sense mildly decreased over the fingertips per tuning fork exam.  Port-A-Cath without erythema.   Lab Results:  Lab Results  Component Value Date   WBC 4.7 07/20/2016   HGB 11.3 (L) 07/20/2016   HCT 36.2 (L) 07/20/2016   MCV 90.5 07/20/2016   PLT 172 07/20/2016   NEUTROABS 3.2 07/20/2016    Imaging:  No results found.  Medications: I have reviewed the patient's current medications.  Assessment/Plan: 1. Adenocarcinoma of the distal esophagus/gastric cardia-status post an endoscopic biopsy on 03/18/2013 confirming adenocarcinoma, HER-2/neunot amplified  -Staging CT scans 03/20/2013 confirmed a distal esophageal/gastric cardia mass, distal paraesophageal lymph nodes and paratracheal nodes concerning for metastatic lymphadenopathy  -A PET scan 03/27/2013 with a multifocal area of hypermetabolic activity involving the thoracic esophagus extending into the gastric cardia and hypermetabolic mediastinal nodes, no distant metastatic disease  -Initiation of radiation 04/07/2013 and  weekly Taxol/carboplatin 04/08/2013, radiation completed 05/15/2013  -Restaging PET scan 06/09/2013-no evidence of metastatic disease, no hypermetabolic lymphadenopathy  -Esophagogastrectomy 07/07/2013 revealed a ypT1b,ypN0 tumor with negative surgical margins  -CT neck 06/09/2014 revealed a necrotic lymph node in the right tracheoesophageal groove  -PET scan 11/15/2014 revealed a single focus of hypermetabolism corresponding to the necrotic right upper mediastinum lymph node -EUS biopsy of the paraesophageal lymph node 06/25/2012 confirmed metastatic adenocarcinoma  -Radiation completed 08/11/2014 -Rising CEA May 2016 -PET scan 07/13/2015 with hypermetabolic right paratracheal and right hilar adenopathy -Cycle 1 FOLFOX 08/03/2015 -Cycle 2 FOLFOX 08/17/2015 - cycle 3 FOLFOX 08/31/2015 (oxaliplatin dose reduced secondary to thrombocytopenia)  -Cycle 4 FOLFOX 09/14/2015 -Cycle 5 FOLFOX 09/28/2015 - restaging CTs 09/30/2015 with stable disease involving neck/chest lymph nodes, a slight increase in size of left upper lobe lung nodule  - cycle 6 FOLFOX 10/12/2015  -FOLFOX discontinued secondary to development of bilateral vocal cord paralysis November 2016 - CT scan of the neck and chest on 11/22/15 revealed no definite disease in the neck. At the thoracic inlet region, there is questionably slight increase in prominence of soft tissue at the right tracheoesophageal groove. This could be an enlarging node or mass but could also relate to the esophagectomy and pull-through surgery.Enlarging lingular and left lower lobe nodules are worrisome for metastatic disease. Peribronchovascular nodular consolidation in the anterior right lower lobe, slightly more prominent, indeterminate. Cholelithiasis. Left diaphragmatic hernia, stable. -Xeloda 1 week on/1 week off beginning 12/11/2015 -Restaging CTs of the neck and chest 02/07/2016 revealed enlargement of 2 left-sided lung nodules, no other evidence of  disease progression -Cycle 1 Pembrolizumab 02/25/2016  -Cycle 2 Pembrolizumab 03/17/2016 -Cycle 3 Pembrolizumab 04/07/2016   -CT 04/20/2016 consistent with progressive disease involving a large pericardial effusion, bilateral pleural effusions,  enlarged mediastinal lymph nodes and lung nodules  Cytology from the pericardial fluid 04/21/2016-positive for metastatic adenocarcinoma -CT 05/01/2016 was slight worsening of right hilar lymphadenopathy with near complete collapse of the right middle lobe, nodular metastases in the left lung, stable bilateral pleural effusions  Cycle 1 Taxol/ramucirumab 05/12/2016; day 8 Taxol 05/19/2016; day 15 Taxol/ramucirumab 05/26/2016.  Cycle 2 day 1 Taxol/ramucirumab 06/22/2016  Cycle 2 day 8 Taxol 06/29/2016   Cycle 2 day 15 Taxol/ramucirumab 07/07/2016  Cycle 3 day 1 Taxol/ramucirumab 07/20/2016  2. Indeterminate 15 mm right liver lesion on the CT scan 03/20/2013, MRI of the liver 06/09/2013 confirmed a right liver hemangioma  3. Anorexia/weight loss and solid dysphagia secondary to #1 -improved 4. History of a colon polyp, status post a polypectomy October 2013  5. history of Thrombocytopenia secondary chemotherapy-the carboplatin wase dose reduced with week 5 of chemotherapy chemotherapy  6. hoarseness secondary to right vocal cord paralysis-CT/PET scan imaging consistent with a malignant right paratracheal lymph node causing the vocal cord paralysis  -Status post a laryngeal plasty at [facility] on 09/02/2014 7. Esophageal stricture-status post dilation 06/08/2015 -Food impaction removal 09/29/2015 -Esophageal stricture dilation 10/06/2015 8. Port-A-Cath placement 07/20/2015 9. Delayed nausea following cycle 1 FOLFOX. Aloxi and Emend added with cycle 2. 10. Thrombocytopenia secondary to chemotherapy  11. Emotional lability/depression-prophylactic Decadron discontinued; Remeron initiated 12. Progressive hoarseness November 2016, diagnosed  with bilateral vocal cord paralysis, status post a tracheostomy and bilateral injection laryngoplasty at [facility] on 10/27/2015 13. Pericardial window 04/21/2016  14. 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 He has completed 2 cycles of Taxol/ramucirumab. The CEA has improved as has his performance status. Plan to proceed with cycle 3 day 1 today as scheduled. The plan is to obtain restaging CT scans after completion of cycle 3. He will return in 2 weeks for a follow-up visit.  Plan reviewed with Dr. Benay Spice.  Bostyn, Bogie ANP/GNP-BC   07/20/2016  9:15 AM

## 2016-07-20 NOTE — Patient Instructions (Signed)
[facility] Discharge Instructions for Patients Receiving Chemotherapy  Today you received the following chemotherapy agents: Taxol and Cyramza   To help prevent nausea and vomiting after your treatment, we encourage you to take your nausea medication as directed.    If you develop nausea and vomiting that is not controlled by your nausea medication, call the clinic.   BELOW ARE SYMPTOMS THAT SHOULD BE REPORTED IMMEDIATELY:  *FEVER GREATER THAN 100.5 F  *CHILLS WITH OR WITHOUT FEVER  NAUSEA AND VOMITING THAT IS NOT CONTROLLED WITH YOUR NAUSEA MEDICATION  *UNUSUAL SHORTNESS OF BREATH  *UNUSUAL BRUISING OR BLEEDING  TENDERNESS IN MOUTH AND THROAT WITH OR WITHOUT PRESENCE OF ULCERS  *URINARY PROBLEMS  *BOWEL PROBLEMS  UNUSUAL RASH Items with * indicate a potential emergency and should be followed up as soon as possible.  Feel free to call the clinic you have any questions or concerns. The clinic phone number is [phone].  Please show the [facility] at check-in to the Emergency Department and triage nurse.

## 2016-07-21 LAB — CEA: CEA: 265 ng/mL — ABNORMAL HIGH (ref 0.0–4.7)

## 2016-07-27 ENCOUNTER — Other Ambulatory Visit: Payer: Self-pay | Admitting: *Deleted

## 2016-07-27 ENCOUNTER — Ambulatory Visit: Payer: BLUE CROSS/BLUE SHIELD

## 2016-07-27 ENCOUNTER — Ambulatory Visit (HOSPITAL_BASED_OUTPATIENT_CLINIC_OR_DEPARTMENT_OTHER): Payer: BLUE CROSS/BLUE SHIELD

## 2016-07-27 ENCOUNTER — Other Ambulatory Visit (HOSPITAL_BASED_OUTPATIENT_CLINIC_OR_DEPARTMENT_OTHER): Payer: BLUE CROSS/BLUE SHIELD

## 2016-07-27 VITALS — BP 128/79 | HR 70 | Temp 99.1°F | Resp 18

## 2016-07-27 DIAGNOSIS — C155 Malignant neoplasm of lower third of esophagus: Secondary | ICD-10-CM

## 2016-07-27 DIAGNOSIS — Z5111 Encounter for antineoplastic chemotherapy: Secondary | ICD-10-CM | POA: Diagnosis not present

## 2016-07-27 DIAGNOSIS — Z95828 Presence of other vascular implants and grafts: Secondary | ICD-10-CM

## 2016-07-27 LAB — COMPREHENSIVE METABOLIC PANEL
ALT: 12 U/L (ref 0–55)
AST: 13 U/L (ref 5–34)
Albumin: 3.3 g/dL — ABNORMAL LOW (ref 3.5–5.0)
Alkaline Phosphatase: 77 U/L (ref 40–150)
Anion Gap: 10 mEq/L (ref 3–11)
BUN: 8.9 mg/dL (ref 7.0–26.0)
CALCIUM: 9.4 mg/dL (ref 8.4–10.4)
CHLORIDE: 106 meq/L (ref 98–109)
CO2: 24 meq/L (ref 22–29)
Creatinine: 0.7 mg/dL (ref 0.7–1.3)
EGFR: >90 mL/min/{1.73_m2} (ref >90)
Glucose: 141 mg/dl — ABNORMAL HIGH (ref 70–140)
POTASSIUM: 4.2 meq/L (ref 3.5–5.1)
SODIUM: 140 meq/L (ref 136–145)
Total Bilirubin: 0.68 mg/dL (ref 0.20–1.20)
Total Protein: 6.9 g/dL (ref 6.4–8.3)

## 2016-07-27 LAB — CBC WITH DIFFERENTIAL/PLATELET
BASO%: 1.7 % (ref 0.0–2.0)
BASOS ABS: 0.1 10*3/uL (ref 0.0–0.1)
EOS%: 2.9 % (ref 0.0–7.0)
Eosinophils Absolute: 0.2 10*3/uL (ref 0.0–0.5)
HEMATOCRIT: 35 % — AB (ref 38.4–49.9)
HGB: 11.3 g/dL — ABNORMAL LOW (ref 13.0–17.1)
LYMPH%: 11.3 % — AB (ref 14.0–49.0)
MCH: 28.4 pg (ref 27.2–33.4)
MCHC: 32.3 g/dL (ref 32.0–36.0)
MCV: 88.1 fL (ref 79.3–98.0)
MONO#: 0.2 10*3/uL (ref 0.1–0.9)
MONO%: 4.6 % (ref 0.0–14.0)
NEUT#: 4.3 10*3/uL (ref 1.5–6.5)
NEUT%: 79.5 % — AB (ref 39.0–75.0)
Platelets: 242 10*3/uL (ref 140–400)
RBC: 3.97 10*6/uL — AB (ref 4.20–5.82)
RDW: 21.3 % — ABNORMAL HIGH (ref 11.0–14.6)
WBC: 5.4 10*3/uL (ref 4.0–10.3)
lymph#: 0.6 10*3/uL — ABNORMAL LOW (ref 0.9–3.3)

## 2016-07-27 MED ORDER — SODIUM CHLORIDE 0.9% FLUSH
10.0000 mL | INTRAVENOUS | Status: DC | PRN
  Administered 2016-07-27: 10 mL
  Filled 2016-07-27: qty 10

## 2016-07-27 MED ORDER — DIPHENHYDRAMINE HCL 50 MG/ML IJ SOLN
INTRAMUSCULAR | Status: AC
  Filled 2016-07-27: qty 1

## 2016-07-27 MED ORDER — HEPARIN SOD (PORK) LOCK FLUSH 100 UNIT/ML IV SOLN
500.0000 [IU] | Freq: Once | INTRAVENOUS | Status: AC | PRN
  Administered 2016-07-27: 500 [IU]
  Filled 2016-07-27: qty 5

## 2016-07-27 MED ORDER — FAMOTIDINE IN NACL 20-0.9 MG/50ML-% IV SOLN
20.0000 mg | Freq: Once | INTRAVENOUS | Status: AC
  Administered 2016-07-27: 20 mg via INTRAVENOUS

## 2016-07-27 MED ORDER — SODIUM CHLORIDE 0.9 % IV SOLN
Freq: Once | INTRAVENOUS | Status: AC
  Administered 2016-07-27: 10:00:00 via INTRAVENOUS

## 2016-07-27 MED ORDER — DIPHENHYDRAMINE HCL 50 MG/ML IJ SOLN
25.0000 mg | Freq: Once | INTRAMUSCULAR | Status: AC
  Administered 2016-07-27: 25 mg via INTRAVENOUS

## 2016-07-27 MED ORDER — FAMOTIDINE IN NACL 20-0.9 MG/50ML-% IV SOLN
INTRAVENOUS | Status: AC
  Filled 2016-07-27: qty 50

## 2016-07-27 MED ORDER — SODIUM CHLORIDE 0.9 % IV SOLN
80.0000 mg/m2 | Freq: Once | INTRAVENOUS | Status: AC
  Administered 2016-07-27: 162 mg via INTRAVENOUS
  Filled 2016-07-27: qty 27

## 2016-07-27 MED ORDER — SODIUM CHLORIDE 0.9 % IV SOLN
10.0000 mg | Freq: Once | INTRAVENOUS | Status: AC
  Administered 2016-07-27: 10 mg via INTRAVENOUS
  Filled 2016-07-27: qty 1

## 2016-07-27 MED ORDER — SODIUM CHLORIDE 0.9 % IJ SOLN
10.0000 mL | INTRAMUSCULAR | Status: DC | PRN
  Administered 2016-07-27: 10 mL via INTRAVENOUS
  Filled 2016-07-27: qty 10

## 2016-07-27 NOTE — Patient Instructions (Signed)
[facility] Discharge Instructions for Patients Receiving Chemotherapy  Today you received the following chemotherapy agents:  Taxol  To help prevent nausea and vomiting after your treatment, we encourage you to take your nausea medication as prescribed.   If you develop nausea and vomiting that is not controlled by your nausea medication, call the clinic.   BELOW ARE SYMPTOMS THAT SHOULD BE REPORTED IMMEDIATELY:  *FEVER GREATER THAN 100.5 F  *CHILLS WITH OR WITHOUT FEVER  NAUSEA AND VOMITING THAT IS NOT CONTROLLED WITH YOUR NAUSEA MEDICATION  *UNUSUAL SHORTNESS OF BREATH  *UNUSUAL BRUISING OR BLEEDING  TENDERNESS IN MOUTH AND THROAT WITH OR WITHOUT PRESENCE OF ULCERS  *URINARY PROBLEMS  *BOWEL PROBLEMS  UNUSUAL RASH Items with * indicate a potential emergency and should be followed up as soon as possible.  Feel free to call the clinic you have any questions or concerns. The clinic phone number is [phone].  Please show the CHEMO ALERT CARD at check-in to the Emergency Department and triage nurse.   

## 2016-07-27 NOTE — Patient Instructions (Signed)

## 2016-07-30 ENCOUNTER — Other Ambulatory Visit: Payer: Self-pay | Admitting: Oncology

## 2016-08-03 ENCOUNTER — Ambulatory Visit (HOSPITAL_BASED_OUTPATIENT_CLINIC_OR_DEPARTMENT_OTHER): Payer: BLUE CROSS/BLUE SHIELD

## 2016-08-03 ENCOUNTER — Other Ambulatory Visit (HOSPITAL_BASED_OUTPATIENT_CLINIC_OR_DEPARTMENT_OTHER): Payer: BLUE CROSS/BLUE SHIELD

## 2016-08-03 ENCOUNTER — Ambulatory Visit: Payer: BLUE CROSS/BLUE SHIELD

## 2016-08-03 ENCOUNTER — Ambulatory Visit (HOSPITAL_BASED_OUTPATIENT_CLINIC_OR_DEPARTMENT_OTHER): Payer: BLUE CROSS/BLUE SHIELD | Admitting: Oncology

## 2016-08-03 ENCOUNTER — Telehealth: Payer: Self-pay | Admitting: Oncology

## 2016-08-03 VITALS — BP 125/79 | HR 90 | Temp 99.3°F | Resp 18 | Ht 72.0 in | Wt 185.2 lb

## 2016-08-03 DIAGNOSIS — C155 Malignant neoplasm of lower third of esophagus: Secondary | ICD-10-CM

## 2016-08-03 DIAGNOSIS — Z5112 Encounter for antineoplastic immunotherapy: Secondary | ICD-10-CM | POA: Diagnosis not present

## 2016-08-03 DIAGNOSIS — R634 Abnormal weight loss: Secondary | ICD-10-CM

## 2016-08-03 DIAGNOSIS — R630 Anorexia: Secondary | ICD-10-CM | POA: Diagnosis not present

## 2016-08-03 DIAGNOSIS — D6959 Other secondary thrombocytopenia: Secondary | ICD-10-CM

## 2016-08-03 DIAGNOSIS — Z5111 Encounter for antineoplastic chemotherapy: Secondary | ICD-10-CM

## 2016-08-03 DIAGNOSIS — Z95828 Presence of other vascular implants and grafts: Secondary | ICD-10-CM

## 2016-08-03 LAB — URINALYSIS, MICROSCOPIC - CHCC
BILIRUBIN (URINE): NEGATIVE
Blood: NEGATIVE
Glucose: NEGATIVE mg/dL
Ketones: NEGATIVE mg/dL
Leukocyte Esterase: NEGATIVE
NITRITE: NEGATIVE
Protein: NEGATIVE mg/dL
Specific Gravity, Urine: 1.015 (ref 1.003–1.035)
UROBILINOGEN UR: 0.2 mg/dL (ref 0.2–1)
pH: 6 (ref 4.6–8.0)

## 2016-08-03 LAB — CBC WITH DIFFERENTIAL/PLATELET
BASO%: 0.3 % (ref 0.0–2.0)
Basophils Absolute: 0 10*3/uL (ref 0.0–0.1)
EOS%: 4.5 % (ref 0.0–7.0)
Eosinophils Absolute: 0.2 10*3/uL (ref 0.0–0.5)
HEMATOCRIT: 32 % — AB (ref 38.4–49.9)
HGB: 10.4 g/dL — ABNORMAL LOW (ref 13.0–17.1)
LYMPH%: 13.4 % — AB (ref 14.0–49.0)
MCH: 29.3 pg (ref 27.2–33.4)
MCHC: 32.5 g/dL (ref 32.0–36.0)
MCV: 90.2 fL (ref 79.3–98.0)
MONO#: 0.2 10*3/uL (ref 0.1–0.9)
MONO%: 6 % (ref 0.0–14.0)
NEUT%: 75.8 % — AB (ref 39.0–75.0)
NEUTROS ABS: 2.9 10*3/uL (ref 1.5–6.5)
PLATELETS: 218 10*3/uL (ref 140–400)
RBC: 3.55 10*6/uL — ABNORMAL LOW (ref 4.20–5.82)
RDW: 21 % — ABNORMAL HIGH (ref 11.0–14.6)
WBC: 3.8 10*3/uL — AB (ref 4.0–10.3)
lymph#: 0.5 10*3/uL — ABNORMAL LOW (ref 0.9–3.3)

## 2016-08-03 LAB — CEA (IN HOUSE-CHCC): CEA (CHCC-IN HOUSE): 95.49 ng/mL — AB (ref 0.00–5.00)

## 2016-08-03 MED ORDER — PACLITAXEL CHEMO INJECTION 300 MG/50ML
80.0000 mg/m2 | Freq: Once | INTRAVENOUS | Status: AC
  Administered 2016-08-03: 162 mg via INTRAVENOUS
  Filled 2016-08-03: qty 27

## 2016-08-03 MED ORDER — HEPARIN SOD (PORK) LOCK FLUSH 100 UNIT/ML IV SOLN
500.0000 [IU] | Freq: Once | INTRAVENOUS | Status: DC | PRN
  Filled 2016-08-03: qty 5

## 2016-08-03 MED ORDER — FAMOTIDINE IN NACL 20-0.9 MG/50ML-% IV SOLN
20.0000 mg | Freq: Once | INTRAVENOUS | Status: AC
  Administered 2016-08-03: 20 mg via INTRAVENOUS

## 2016-08-03 MED ORDER — DIPHENHYDRAMINE HCL 50 MG/ML IJ SOLN
INTRAMUSCULAR | Status: AC
  Filled 2016-08-03: qty 1

## 2016-08-03 MED ORDER — ACETAMINOPHEN 160 MG/5ML PO SOLN
650.0000 mg | Freq: Once | ORAL | Status: AC
  Administered 2016-08-03: 650 mg via ORAL
  Filled 2016-08-03: qty 20.3

## 2016-08-03 MED ORDER — ACETAMINOPHEN 325 MG PO TABS
ORAL_TABLET | ORAL | Status: AC
  Filled 2016-08-03: qty 2

## 2016-08-03 MED ORDER — SODIUM CHLORIDE 0.9 % IJ SOLN
10.0000 mL | INTRAMUSCULAR | Status: DC | PRN
  Administered 2016-08-03: 10 mL via INTRAVENOUS
  Filled 2016-08-03: qty 10

## 2016-08-03 MED ORDER — SODIUM CHLORIDE 0.9 % IV SOLN
8.0000 mg/kg | Freq: Once | INTRAVENOUS | Status: AC
  Administered 2016-08-03: 600 mg via INTRAVENOUS
  Filled 2016-08-03: qty 50

## 2016-08-03 MED ORDER — SODIUM CHLORIDE 0.9 % IV SOLN
Freq: Once | INTRAVENOUS | Status: AC
  Administered 2016-08-03: 10:00:00 via INTRAVENOUS

## 2016-08-03 MED ORDER — SODIUM CHLORIDE 0.9% FLUSH
10.0000 mL | INTRAVENOUS | Status: DC | PRN
  Filled 2016-08-03: qty 10

## 2016-08-03 MED ORDER — SODIUM CHLORIDE 0.9 % IV SOLN
10.0000 mg | Freq: Once | INTRAVENOUS | Status: AC
  Administered 2016-08-03: 10 mg via INTRAVENOUS
  Filled 2016-08-03: qty 1

## 2016-08-03 MED ORDER — FAMOTIDINE IN NACL 20-0.9 MG/50ML-% IV SOLN
INTRAVENOUS | Status: AC
  Filled 2016-08-03: qty 50

## 2016-08-03 MED ORDER — DIPHENHYDRAMINE HCL 50 MG/ML IJ SOLN
25.0000 mg | Freq: Once | INTRAMUSCULAR | Status: AC
  Administered 2016-08-03: 25 mg via INTRAVENOUS

## 2016-08-03 NOTE — Patient Instructions (Signed)

## 2016-08-03 NOTE — Patient Instructions (Signed)
[facility] Discharge Instructions for Patients Receiving Chemotherapy  Today you received the following chemotherapy agents Taxol and Cyramza.  To help prevent nausea and vomiting after your treatment, we encourage you to take your nausea medication as prescribed.   If you develop nausea and vomiting that is not controlled by your nausea medication, call the clinic.   BELOW ARE SYMPTOMS THAT SHOULD BE REPORTED IMMEDIATELY:  *FEVER GREATER THAN 100.5 F  *CHILLS WITH OR WITHOUT FEVER  NAUSEA AND VOMITING THAT IS NOT CONTROLLED WITH YOUR NAUSEA MEDICATION  *UNUSUAL SHORTNESS OF BREATH  *UNUSUAL BRUISING OR BLEEDING  TENDERNESS IN MOUTH AND THROAT WITH OR WITHOUT PRESENCE OF ULCERS  *URINARY PROBLEMS  *BOWEL PROBLEMS  UNUSUAL RASH Items with * indicate a potential emergency and should be followed up as soon as possible.  Feel free to call the clinic you have any questions or concerns. The clinic phone number is [phone].  Please show the [facility] at check-in to the Emergency Department and triage nurse.

## 2016-08-03 NOTE — Telephone Encounter (Signed)
GAVE PATIENT AVS REPORT AND APPOINTMENTS FOR September. [facility] WILL CALL RE SCAN - PATIENT AWARE.

## 2016-08-03 NOTE — Progress Notes (Addendum)
[facility] OFFICE PROGRESS NOTE   Diagnosis: Esophagus cancer  INTERVAL HISTORY:   He returns as scheduled. He began cycle 3 of Taxol and ramucirumab on 07/20/2016. He reports an increased cough with small volume hemoptysis yesterday. He is eating. He is getting out of the house. He reports peripheral numbness that has not changed since beginning the Taxol. This does not interfere with activity.  Objective:  Vital signs in last 24 hours:  Blood pressure 125/79, pulse 90, temperature 99.3 F (37.4 C), temperature source Oral, resp. rate 18, height 6' (1.829 m), weight 185 lb 3.2 oz (84 kg), SpO2 99 %.    HEENT: No thrush or ulcers Resp: Lungs clear bilaterally Cardio: Regular rate and rhythm GI: No hepatomegaly, nontender Vascular: No leg edema  Neurologic: Moderate loss of vibratory sense at the fingertips bilaterally Skin: Mild erythema surrounding the tracheostomy site   Portacath/PICC-without erythema  Lab Results:  Lab Results  Component Value Date   WBC 3.8 (L) 08/03/2016   HGB 10.4 (L) 08/03/2016   HCT 32.0 (L) 08/03/2016   MCV 90.2 08/03/2016   PLT 218 08/03/2016   NEUTROABS 2.9 08/03/2016    Lab Results  Component Value Date   CEA1 265.0 (H) 07/20/2016    Medications: I have reviewed the patient's current medications.  Assessment/Plan: 1. Adenocarcinoma of the distal esophagus/gastric cardia-status post an endoscopic biopsy on 03/18/2013 confirming adenocarcinoma, HER-2/neunot amplified  -Staging CT scans 03/20/2013 confirmed a distal esophageal/gastric cardia mass, distal paraesophageal lymph nodes and paratracheal nodes concerning for metastatic lymphadenopathy  -A PET scan 03/27/2013 with a multifocal area of hypermetabolic activity involving the thoracic esophagus extending into the gastric cardia and hypermetabolic mediastinal nodes, no distant metastatic disease  -Initiation of radiation 04/07/2013 and weekly Taxol/carboplatin  04/08/2013, radiation completed 05/15/2013  -Restaging PET scan 06/09/2013-no evidence of metastatic disease, no hypermetabolic lymphadenopathy  -Esophagogastrectomy 07/07/2013 revealed a ypT1b,ypN0 tumor with negative surgical margins  -CT neck 06/09/2014 revealed a necrotic lymph node in the right tracheoesophageal groove  -PET scan 11/15/2014 revealed a single focus of hypermetabolism corresponding to the necrotic right upper mediastinum lymph node -EUS biopsy of the paraesophageal lymph node 06/25/2012 confirmed metastatic adenocarcinoma  -Radiation completed 08/11/2014 -Rising CEA May 2016 -PET scan 07/13/2015 with hypermetabolic right paratracheal and right hilar adenopathy -Cycle 1 FOLFOX 08/03/2015 -Cycle 2 FOLFOX 08/17/2015 - cycle 3 FOLFOX 08/31/2015 (oxaliplatin dose reduced secondary to thrombocytopenia)  -Cycle 4 FOLFOX 09/14/2015 -Cycle 5 FOLFOX 09/28/2015 - restaging CTs 09/30/2015 with stable disease involving neck/chest lymph nodes, a slight increase in size of left upper lobe lung nodule  - cycle 6 FOLFOX 10/12/2015  -FOLFOX discontinued secondary to development of bilateral vocal cord paralysis November 2016 - CT scan of the neck and chest on 11/22/15 revealed no definite disease in the neck. At the thoracic inlet region, there is questionably slight increase in prominence of soft tissue at the right tracheoesophageal groove. This could be an enlarging node or mass but could also relate to the esophagectomy and pull-through surgery.Enlarging lingular and left lower lobe nodules are worrisome for metastatic disease. Peribronchovascular nodular consolidation in the anterior right lower lobe, slightly more prominent, indeterminate. Cholelithiasis. Left diaphragmatic hernia, stable. -Xeloda 1 week on/1 week off beginning 12/11/2015 -Restaging CTs of the neck and chest 02/07/2016 revealed enlargement of 2 left-sided lung nodules, no other evidence of disease  progression -Cycle 1 Pembrolizumab 02/25/2016  -Cycle 2 Pembrolizumab 03/17/2016 -Cycle 3 Pembrolizumab 04/07/2016   -CT 04/20/2016 consistent with progressive disease involving a  large pericardial effusion, bilateral pleural effusions, enlarged mediastinal lymph nodes and lung nodules  Cytology from the pericardial fluid 04/21/2016-positive for metastatic adenocarcinoma -CT 05/01/2016 was slight worsening of right hilar lymphadenopathy with near complete collapse of the right middle lobe, nodular metastases in the left lung, stable bilateral pleural effusions  Cycle 1 Taxol/ramucirumab 05/12/2016; day 8 Taxol 05/19/2016; day 15 Taxol/ramucirumab 05/26/2016.  Cycle 2 day 1 Taxol/ramucirumab 06/22/2016  Cycle 2 day 8 Taxol 06/29/2016   Cycle 2 day 15 Taxol/ramucirumab07/28/2017  Cycle 3 day 1 Taxol/ramucirumab 07/20/2016  Cycle 3 day 8 Taxol 07/27/2016  Cycle 3 day 15 Taxol/ramucirumab 08/03/2016  2. Indeterminate 15 mm right liver lesion on the CT scan 03/20/2013, MRI of the liver 06/09/2013 confirmed a right liver hemangioma  3. Anorexia/weight loss and solid dysphagia secondary to #1 -improved 4. History of a colon polyp, status post a polypectomy October 2013  5. history of Thrombocytopenia secondary chemotherapy-the carboplatin wase dose reduced with week 5 of chemotherapy chemotherapy  6. hoarseness secondary to right vocal cord paralysis-CT/PET scan imaging consistent with a malignant right paratracheal lymph node causing the vocal cord paralysis  -Status post a laryngeal plasty at [facility] on 09/02/2014 7. Esophageal stricture-status post dilation 06/08/2015 -Food impaction removal 09/29/2015 -Esophageal stricture dilation 10/06/2015 8. Port-A-Cath placement 07/20/2015 9. Delayed nausea following cycle 1 FOLFOX. Aloxi and Emend added with cycle 2. 10. Thrombocytopenia secondary to chemotherapy  11. Emotional lability/depression-prophylactic Decadron discontinued;  Remeron initiated 12. Progressive hoarseness November 2016, diagnosed with bilateral vocal cord paralysis, status post a tracheostomy and bilateral injection laryngoplasty at [facility] on 10/27/2015 13. Pericardial window 04/21/2016  14. High fever 07/06/2016-chest x-ray suggestive of possible left lower lobe pneumonia-treated with Augmentin     Disposition:  He appears stable. He will complete the third cycle of salvage therapy today. He will then undergo a restaging CT evaluation. His overall performance status is significantly improved since beginning the Taxol/ramucirumab. The CEA is lower.  He will return for an office visit with the plan to begin cycle 4 on 08/17/2016.  Betsy Coder, MD  08/03/2016  9:36 AM

## 2016-08-04 LAB — CEA: CEA1: 148.2 ng/mL — AB (ref 0.0–4.7)

## 2016-08-07 ENCOUNTER — Encounter: Payer: Self-pay | Admitting: Oncology

## 2016-08-07 NOTE — Progress Notes (Signed)
forms left in box °

## 2016-08-09 ENCOUNTER — Encounter: Payer: Self-pay | Admitting: Oncology

## 2016-08-09 NOTE — Progress Notes (Signed)
forms left in box-left for dr. Benay Spice to sign

## 2016-08-09 NOTE — Progress Notes (Signed)
forms left in box-left for dr. Benay Spice to sign-nothing for dr to sign.just faxed notes/labs [phone]

## 2016-08-10 ENCOUNTER — Encounter (HOSPITAL_COMMUNITY): Payer: Self-pay

## 2016-08-10 ENCOUNTER — Ambulatory Visit (HOSPITAL_COMMUNITY)
Admission: RE | Admit: 2016-08-10 | Discharge: 2016-08-10 | Disposition: A | Discharge status: Home / Self Care | Payer: BLUE CROSS/BLUE SHIELD | Source: Ambulatory Visit | Attending: Oncology | Admitting: Oncology

## 2016-08-10 ENCOUNTER — Other Ambulatory Visit: Payer: Self-pay | Admitting: Oncology

## 2016-08-10 DIAGNOSIS — E279 Disorder of adrenal gland, unspecified: Secondary | ICD-10-CM | POA: Insufficient documentation

## 2016-08-10 DIAGNOSIS — Z9889 Other specified postprocedural states: Secondary | ICD-10-CM | POA: Insufficient documentation

## 2016-08-10 DIAGNOSIS — M899 Disorder of bone, unspecified: Secondary | ICD-10-CM | POA: Diagnosis not present

## 2016-08-10 DIAGNOSIS — C155 Malignant neoplasm of lower third of esophagus: Secondary | ICD-10-CM | POA: Diagnosis not present

## 2016-08-10 DIAGNOSIS — R911 Solitary pulmonary nodule: Secondary | ICD-10-CM | POA: Diagnosis not present

## 2016-08-10 DIAGNOSIS — K8020 Calculus of gallbladder without cholecystitis without obstruction: Secondary | ICD-10-CM | POA: Insufficient documentation

## 2016-08-10 MED ORDER — IOPAMIDOL (ISOVUE-300) INJECTION 61%
75.0000 mL | Freq: Once | INTRAVENOUS | Status: AC | PRN
  Administered 2016-08-10: 75 mL via INTRAVENOUS

## 2016-08-10 MED ORDER — CIPROFLOXACIN HCL 500 MG PO TABS: 500.0000 mg | ORAL_TABLET | Freq: Two times a day (BID) | ORAL | 0 refills | Status: DC

## 2016-08-10 MED FILL — ALPRAZolam 0.5 MG TABS: 0.5 | 20 days supply | Qty: 60 | Fill #0

## 2016-08-10 MED FILL — MIRTAZAPINE 15 MG TABLET: 15 | 30 days supply | Qty: 30 | Fill #1

## 2016-08-10 MED FILL — CIPROFLOXACIN HCL 500 MG TA: 500 | 3 days supply | Qty: 6 | Fill #0

## 2016-08-10 NOTE — Telephone Encounter (Signed)
Called pt with CT results: Looks better overall, per Dr. Benay Spice:There is a place on the spine that we don't know what it is. Plan to push ahead with treatment. Cipro instructions reviewed as well. Pt voiced understanding.

## 2016-08-10 NOTE — Telephone Encounter (Addendum)
Message from pt requesting lab results. Returned call to pt with CEA results. He voiced understanding and appreciation for call. Pt reports dysuria persists. Discussed with Dr. Benay Spice: Will treat empirically with Cipro for 3 days.

## 2016-08-14 ENCOUNTER — Other Ambulatory Visit: Payer: Self-pay | Admitting: Oncology

## 2016-08-15 ENCOUNTER — Telehealth: Payer: Self-pay | Admitting: *Deleted

## 2016-08-15 ENCOUNTER — Other Ambulatory Visit: Payer: Self-pay | Admitting: *Deleted

## 2016-08-15 MED ORDER — MIRTAZAPINE 15 MG PO TABS: 15.0000 mg | ORAL_TABLET | Freq: Every day | ORAL | 1 refills | Status: DC

## 2016-08-15 NOTE — Telephone Encounter (Signed)
Call received from patient to assure that he has flush appt for labs this Thursday, to request a refill for Remeron and for Dr. Benay Spice to release his CT results for MyChart.  Schedule reviewed with pt, Remeron refill requested and Dr. Benay Spice notified of releasing CT for patient.  Pt has no further questions or concerns at this time.

## 2016-08-17 ENCOUNTER — Ambulatory Visit (HOSPITAL_BASED_OUTPATIENT_CLINIC_OR_DEPARTMENT_OTHER): Payer: BLUE CROSS/BLUE SHIELD | Admitting: Nurse Practitioner

## 2016-08-17 ENCOUNTER — Ambulatory Visit (HOSPITAL_BASED_OUTPATIENT_CLINIC_OR_DEPARTMENT_OTHER): Payer: BLUE CROSS/BLUE SHIELD

## 2016-08-17 ENCOUNTER — Telehealth: Payer: Self-pay | Admitting: Oncology

## 2016-08-17 ENCOUNTER — Other Ambulatory Visit (HOSPITAL_BASED_OUTPATIENT_CLINIC_OR_DEPARTMENT_OTHER): Payer: BLUE CROSS/BLUE SHIELD

## 2016-08-17 ENCOUNTER — Ambulatory Visit: Payer: BLUE CROSS/BLUE SHIELD

## 2016-08-17 VITALS — BP 113/73 | HR 94 | Temp 98.5°F | Resp 16 | Ht 73.0 in | Wt 186.2 lb

## 2016-08-17 DIAGNOSIS — C155 Malignant neoplasm of lower third of esophagus: Secondary | ICD-10-CM

## 2016-08-17 DIAGNOSIS — Z95828 Presence of other vascular implants and grafts: Secondary | ICD-10-CM

## 2016-08-17 DIAGNOSIS — Z5111 Encounter for antineoplastic chemotherapy: Secondary | ICD-10-CM

## 2016-08-17 LAB — CBC WITH DIFFERENTIAL/PLATELET
BASO%: 0.8 % (ref 0.0–2.0)
BASOS ABS: 0.1 10*3/uL (ref 0.0–0.1)
EOS ABS: 0.2 10*3/uL (ref 0.0–0.5)
EOS%: 2.7 % (ref 0.0–7.0)
HCT: 35.7 % — ABNORMAL LOW (ref 38.4–49.9)
HEMOGLOBIN: 11.4 g/dL — AB (ref 13.0–17.1)
LYMPH%: 13.2 % — ABNORMAL LOW (ref 14.0–49.0)
MCH: 29.2 pg (ref 27.2–33.4)
MCHC: 31.9 g/dL — ABNORMAL LOW (ref 32.0–36.0)
MCV: 91.5 fL (ref 79.3–98.0)
MONO#: 0.9 10*3/uL (ref 0.1–0.9)
MONO%: 15.5 % — AB (ref 0.0–14.0)
NEUT#: 4 10*3/uL (ref 1.5–6.5)
NEUT%: 67.8 % (ref 39.0–75.0)
NRBC: 0 % (ref 0–0)
PLATELETS: 207 10*3/uL (ref 140–400)
RBC: 3.9 10*6/uL — AB (ref 4.20–5.82)
RDW: 18.7 % — AB (ref 11.0–14.6)
WBC: 5.9 10*3/uL (ref 4.0–10.3)
lymph#: 0.8 10*3/uL — ABNORMAL LOW (ref 0.9–3.3)

## 2016-08-17 LAB — COMPREHENSIVE METABOLIC PANEL
ALBUMIN: 3.2 g/dL — AB (ref 3.5–5.0)
ALK PHOS: 82 U/L (ref 40–150)
ALT: <9 U/L (ref 0–55)
ANION GAP: 7 meq/L (ref 3–11)
AST: 11 U/L (ref 5–34)
BILIRUBIN TOTAL: 0.39 mg/dL (ref 0.20–1.20)
BUN: 7.9 mg/dL (ref 7.0–26.0)
CO2: 26 mEq/L (ref 22–29)
Calcium: 9.2 mg/dL (ref 8.4–10.4)
Chloride: 108 mEq/L (ref 98–109)
Creatinine: 0.7 mg/dL (ref 0.7–1.3)
Glucose: 94 mg/dl (ref 70–140)
POTASSIUM: 4.3 meq/L (ref 3.5–5.1)
Sodium: 141 mEq/L (ref 136–145)
TOTAL PROTEIN: 6.9 g/dL (ref 6.4–8.3)

## 2016-08-17 LAB — CEA (IN HOUSE-CHCC): CEA (CHCC-In House): 62.28 ng/mL — ABNORMAL HIGH (ref 0.00–5.00)

## 2016-08-17 MED ORDER — DIPHENHYDRAMINE HCL 50 MG/ML IJ SOLN
INTRAMUSCULAR | Status: AC
  Filled 2016-08-17: qty 1

## 2016-08-17 MED ORDER — ACETAMINOPHEN 160 MG/5ML PO SOLN
650.0000 mg | Freq: Once | ORAL | Status: AC
  Administered 2016-08-17: 650 mg via ORAL
  Filled 2016-08-17: qty 20.3

## 2016-08-17 MED ORDER — SODIUM CHLORIDE 0.9 % IJ SOLN
10.0000 mL | INTRAMUSCULAR | Status: DC | PRN
  Administered 2016-08-17: 10 mL via INTRAVENOUS
  Filled 2016-08-17: qty 10

## 2016-08-17 MED ORDER — HEPARIN SOD (PORK) LOCK FLUSH 100 UNIT/ML IV SOLN
500.0000 [IU] | Freq: Once | INTRAVENOUS | Status: AC | PRN
  Administered 2016-08-17: 500 [IU]
  Filled 2016-08-17: qty 5

## 2016-08-17 MED ORDER — SODIUM CHLORIDE 0.9 % IV SOLN
80.0000 mg/m2 | Freq: Once | INTRAVENOUS | Status: AC
  Administered 2016-08-17: 162 mg via INTRAVENOUS
  Filled 2016-08-17: qty 27

## 2016-08-17 MED ORDER — SODIUM CHLORIDE 0.9 % IV SOLN
10.0000 mg | Freq: Once | INTRAVENOUS | Status: AC
  Administered 2016-08-17: 10 mg via INTRAVENOUS
  Filled 2016-08-17: qty 1

## 2016-08-17 MED ORDER — SODIUM CHLORIDE 0.9 % IV SOLN
8.0000 mg/kg | Freq: Once | INTRAVENOUS | Status: AC
  Administered 2016-08-17: 600 mg via INTRAVENOUS
  Filled 2016-08-17: qty 10

## 2016-08-17 MED ORDER — FAMOTIDINE IN NACL 20-0.9 MG/50ML-% IV SOLN
INTRAVENOUS | Status: AC
  Filled 2016-08-17: qty 50

## 2016-08-17 MED ORDER — DIPHENHYDRAMINE HCL 50 MG/ML IJ SOLN
25.0000 mg | Freq: Once | INTRAMUSCULAR | Status: AC
  Administered 2016-08-17: 25 mg via INTRAVENOUS

## 2016-08-17 MED ORDER — FAMOTIDINE IN NACL 20-0.9 MG/50ML-% IV SOLN
20.0000 mg | Freq: Once | INTRAVENOUS | Status: AC
  Administered 2016-08-17: 20 mg via INTRAVENOUS

## 2016-08-17 MED ORDER — SODIUM CHLORIDE 0.9% FLUSH
10.0000 mL | INTRAVENOUS | Status: DC | PRN
  Administered 2016-08-17: 10 mL
  Filled 2016-08-17: qty 10

## 2016-08-17 MED ORDER — SODIUM CHLORIDE 0.9 % IV SOLN
Freq: Once | INTRAVENOUS | Status: AC
  Administered 2016-08-17: 13:00:00 via INTRAVENOUS

## 2016-08-17 NOTE — Telephone Encounter (Signed)
Gave patient avs report and appointments for September and October  °

## 2016-08-17 NOTE — Progress Notes (Addendum)
[place] OFFICE PROGRESS NOTE   Diagnosis:  Esophagus cancer  INTERVAL HISTORY:   He returns as scheduled. He completed cycle 3 Taxol and ramucirumab on 08/03/2016. He denies nausea/vomiting. No mouth sores. No diarrhea. He has tingling in the fingertips and occasional numbness. Symptoms are stable. He reports a good appetite.  Objective:  Vital signs in last 24 hours:  Blood pressure 113/73, pulse 94, temperature 98.5 F (36.9 C), temperature source Oral, resp. rate 16, height '6\' 1"'$  (1.854 m), weight 186 lb 3.2 oz (84.5 kg), SpO2 98 %.    HEENT: No thrush or ulcers. Resp: Lungs clear bilaterally. Cardio: Regular rate and rhythm. GI: No hepatomegaly. Vascular: No leg edema. Port-A-Cath without erythema.   Lab Results:  Lab Results  Component Value Date   WBC 5.9 08/17/2016   HGB 11.4 (L) 08/17/2016   HCT 35.7 (L) 08/17/2016   MCV 91.5 08/17/2016   PLT 207 08/17/2016   NEUTROABS 4.0 08/17/2016    Imaging:  No results found.  Medications: I have reviewed the patient's current medications.  Assessment/Plan: 1. Adenocarcinoma of the distal esophagus/gastric cardia-status post an endoscopic biopsy on 03/18/2013 confirming adenocarcinoma, HER-2/neunot amplified  -Staging CT scans 03/20/2013 confirmed a distal esophageal/gastric cardia mass, distal paraesophageal lymph nodes and paratracheal nodes concerning for metastatic lymphadenopathy  -A PET scan 03/27/2013 with a multifocal area of hypermetabolic activity involving the thoracic esophagus extending into the gastric cardia and hypermetabolic mediastinal nodes, no distant metastatic disease  -Initiation of radiation 04/07/2013 and weekly Taxol/carboplatin 04/08/2013, radiation completed 05/15/2013  -Restaging PET scan 06/09/2013-no evidence of metastatic disease, no hypermetabolic lymphadenopathy  -Esophagogastrectomy 07/07/2013 revealed a ypT1b,ypN0 tumor with negative surgical margins  -CT  neck 06/09/2014 revealed a necrotic lymph node in the right tracheoesophageal groove  -PET scan 11/15/2014 revealed a single focus of hypermetabolism corresponding to the necrotic right upper mediastinum lymph node -EUS biopsy of the paraesophageal lymph node 06/25/2012 confirmed metastatic adenocarcinoma  -Radiation completed 08/11/2014 -Rising CEA May 2016 -PET scan 07/13/2015 with hypermetabolic right paratracheal and right hilar adenopathy -Cycle 1 FOLFOX 08/03/2015 -Cycle 2 FOLFOX 08/17/2015 - cycle 3 FOLFOX 08/31/2015 (oxaliplatin dose reduced secondary to thrombocytopenia)  -Cycle 4 FOLFOX 09/14/2015 -Cycle 5 FOLFOX 09/28/2015 - restaging CTs 09/30/2015 with stable disease involving neck/chest lymph nodes, a slight increase in size of left upper lobe lung nodule  - cycle 6 FOLFOX 10/12/2015  -FOLFOX discontinued secondary to development of bilateral vocal cord paralysis November 2016 - CT scan of the neck and chest on 11/22/15 revealed no definite disease in the neck. At the thoracic inlet region, there is questionably slight increase in prominence of soft tissue at the right tracheoesophageal groove. This could be an enlarging node or mass but could also relate to the esophagectomy and pull-through surgery.Enlarging lingular and left lower lobe nodules are worrisome for metastatic disease. Peribronchovascular nodular consolidation in the anterior right lower lobe, slightly more prominent, indeterminate. Cholelithiasis. Left diaphragmatic hernia, stable. -Xeloda 1 week on/1 week off beginning 12/11/2015 -Restaging CTs of the neck and chest 02/07/2016 revealed enlargement of 2 left-sided lung nodules, no other evidence of disease progression -Cycle 1 Pembrolizumab 02/25/2016  -Cycle 2 Pembrolizumab 03/17/2016 -Cycle 3 Pembrolizumab 04/07/2016   -CT 04/20/2016 consistent with progressive disease involving a large pericardial effusion, bilateral pleural effusions, enlarged  mediastinal lymph nodes and lung nodules  Cytology from the pericardial fluid 04/21/2016-positive for metastatic adenocarcinoma -CT 05/01/2016 was slight worsening of right hilar lymphadenopathy with near complete collapse of the right middle lobe,  nodular metastases in the left lung, stable bilateral pleural effusions  Cycle 1 Taxol/ramucirumab 05/12/2016; day 8 Taxol 05/19/2016; day 15 Taxol/ramucirumab 05/26/2016.  Cycle 2 day 1 Taxol/ramucirumab 06/22/2016  Cycle 2 day 8 Taxol 06/29/2016   Cycle 2 day 15 Taxol/ramucirumab07/28/2017  Cycle 3 day 1 Taxol/ramucirumab 07/20/2016  Cycle 3 day 8 Taxol 07/27/2016  Cycle 3 day 15 Taxol/ramucirumab 08/03/2016  Restaging chest CT 08/10/2016-left upper lobe pulmonary nodule similar; left lower lobe pulmonary nodule may have enlarged minimally; improved appearance of the mediastinum and right infrahilar region with decreased soft tissue fullness; improvement in probable left infrahilar adenopathy; decreased left and resolved right pleural effusions; developing right adrenal nodularity; T7 vertebral body lesion suspicious for osseous metastasis.  2. Indeterminate 15 mm right liver lesion on the CT scan 03/20/2013, MRI of the liver 06/09/2013 confirmed a right liver hemangioma  3. Anorexia/weight loss and solid dysphagia secondary to #1 -improved 4. History of a colon polyp, status post a polypectomy October 2013  5. history of Thrombocytopenia secondary chemotherapy-the carboplatin wase dose reduced with week 5 of chemotherapy chemotherapy  6. hoarseness secondary to right vocal cord paralysis-CT/PET scan imaging consistent with a malignant right paratracheal lymph node causing the vocal cord paralysis  -Status post a laryngeal plasty at [facility] on 09/02/2014 7. Esophageal stricture-status post dilation 06/08/2015 -Food impaction removal 09/29/2015 -Esophageal stricture dilation 10/06/2015 8. Port-A-Cath placement 07/20/2015 9. Delayed  nausea following cycle 1 FOLFOX. Aloxi and Emend added with cycle 2. 10. Thrombocytopenia secondary to chemotherapy  11. Emotional lability/depression-prophylactic Decadron discontinued; Remeron initiated 12. Progressive hoarseness November 2016, diagnosed with bilateral vocal cord paralysis, status post a tracheostomy and bilateral injection laryngoplasty at [facility] on 10/27/2015 13. Pericardial window 04/21/2016  14. 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 He has completed 3 cycles of Taxol/ramucirumab. The recent restaging CT evaluation shows evidence of a response. Dr. Benay Spice reviewed the CT images on the computer with Dr. Verline Lema at today's visit and recommends continuation of the current treatment. The plan is to proceed with cycle 4 day 1 Taxol/ramucirumab today as scheduled. He will return for a follow-up visit and cycle 5 Taxol/ramucirumab on 09/13/2016.  Patient seen with Dr. Benay Spice. 25 minutes were spent face-to-face at today's visit with the majority of that time involved in counseling/coordination of care.    Jaimin, Krupka ANP/GNP-BC   08/17/2016  1:28 PM   This was a shared visit with Ned Card. We reviewed the restaging CT images with Dr.Clodfelter. The overall pattern is of clinical and radiologic improvement. The CEA has declined. The plan is to continue Taxol/ ramucirumab.  Julieanne Manson, M.D.

## 2016-08-17 NOTE — Patient Instructions (Signed)

## 2016-08-17 NOTE — Patient Instructions (Signed)
[facility] Discharge Instructions for Patients Receiving Chemotherapy  Today you received the following chemotherapy agents:  Taxol, Cyramza  To help prevent nausea and vomiting after your treatment, we encourage you to take your nausea medication as prescribed.   If you develop nausea and vomiting that is not controlled by your nausea medication, call the clinic.   BELOW ARE SYMPTOMS THAT SHOULD BE REPORTED IMMEDIATELY:  *FEVER GREATER THAN 100.5 F  *CHILLS WITH OR WITHOUT FEVER  NAUSEA AND VOMITING THAT IS NOT CONTROLLED WITH YOUR NAUSEA MEDICATION  *UNUSUAL SHORTNESS OF BREATH  *UNUSUAL BRUISING OR BLEEDING  TENDERNESS IN MOUTH AND THROAT WITH OR WITHOUT PRESENCE OF ULCERS  *URINARY PROBLEMS  *BOWEL PROBLEMS  UNUSUAL RASH Items with * indicate a potential emergency and should be followed up as soon as possible.  Feel free to call the clinic you have any questions or concerns. The clinic phone number is [phone].  Please show the [facility] at check-in to the Emergency Department and triage nurse.

## 2016-08-20 ENCOUNTER — Other Ambulatory Visit: Payer: Self-pay | Admitting: Oncology

## 2016-08-24 ENCOUNTER — Telehealth: Payer: Self-pay | Admitting: *Deleted

## 2016-08-24 ENCOUNTER — Other Ambulatory Visit: Payer: Self-pay | Admitting: *Deleted

## 2016-08-24 DIAGNOSIS — C155 Malignant neoplasm of lower third of esophagus: Secondary | ICD-10-CM

## 2016-08-24 NOTE — Telephone Encounter (Signed)
Returned call to pt: Dr. Benay Spice will discuss with Dr. Lisbeth Renshaw. Plan would be for radiation if lesion is causing pain, per Dr. Benay Spice. Pt voiced appreciation for call.

## 2016-08-24 NOTE — Telephone Encounter (Signed)
Call from pt asking to have T7 lesion evaluated further. "Is Dr. Benay Spice going to talk to Dr. Fanny Skates about it? I would like to have a bone scan to look at it further." Discussed with Dr. Benay Spice: Bone scan wouldn't help. We can refer to radiation if he is having pain.  Pt asked if forms were completed and faxed. He dropped them off before his last visit. Noted there were forms faxed on 8/30. Informed him of this.

## 2016-08-25 ENCOUNTER — Other Ambulatory Visit (HOSPITAL_BASED_OUTPATIENT_CLINIC_OR_DEPARTMENT_OTHER): Payer: BLUE CROSS/BLUE SHIELD

## 2016-08-25 ENCOUNTER — Ambulatory Visit: Payer: BLUE CROSS/BLUE SHIELD

## 2016-08-25 ENCOUNTER — Ambulatory Visit (HOSPITAL_BASED_OUTPATIENT_CLINIC_OR_DEPARTMENT_OTHER): Payer: BLUE CROSS/BLUE SHIELD

## 2016-08-25 VITALS — BP 112/67 | HR 87 | Temp 98.6°F | Resp 18

## 2016-08-25 DIAGNOSIS — Z5111 Encounter for antineoplastic chemotherapy: Secondary | ICD-10-CM | POA: Diagnosis not present

## 2016-08-25 DIAGNOSIS — C155 Malignant neoplasm of lower third of esophagus: Secondary | ICD-10-CM

## 2016-08-25 DIAGNOSIS — Z95828 Presence of other vascular implants and grafts: Secondary | ICD-10-CM

## 2016-08-25 LAB — CBC WITH DIFFERENTIAL/PLATELET
BASO%: 0.9 % (ref 0.0–2.0)
BASOS ABS: 0.1 10*3/uL (ref 0.0–0.1)
EOS%: 1 % (ref 0.0–7.0)
Eosinophils Absolute: 0.1 10*3/uL (ref 0.0–0.5)
HEMATOCRIT: 33.1 % — AB (ref 38.4–49.9)
HEMOGLOBIN: 10.8 g/dL — AB (ref 13.0–17.1)
LYMPH#: 0.6 10*3/uL — AB (ref 0.9–3.3)
LYMPH%: 4.7 % — ABNORMAL LOW (ref 14.0–49.0)
MCH: 29.4 pg (ref 27.2–33.4)
MCHC: 32.6 g/dL (ref 32.0–36.0)
MCV: 90.2 fL (ref 79.3–98.0)
MONO#: 0.4 10*3/uL (ref 0.1–0.9)
MONO%: 3.2 % (ref 0.0–14.0)
NEUT#: 11 10*3/uL — ABNORMAL HIGH (ref 1.5–6.5)
NEUT%: 90.2 % — AB (ref 39.0–75.0)
PLATELETS: 191 10*3/uL (ref 140–400)
RBC: 3.66 10*6/uL — ABNORMAL LOW (ref 4.20–5.82)
RDW: 19.1 % — ABNORMAL HIGH (ref 11.0–14.6)
WBC: 12.2 10*3/uL — ABNORMAL HIGH (ref 4.0–10.3)

## 2016-08-25 LAB — COMPREHENSIVE METABOLIC PANEL
ALBUMIN: 3.2 g/dL — AB (ref 3.5–5.0)
ALK PHOS: 82 U/L (ref 40–150)
ALT: 9 U/L (ref 0–55)
ANION GAP: 9 meq/L (ref 3–11)
AST: 10 U/L (ref 5–34)
BILIRUBIN TOTAL: 0.55 mg/dL (ref 0.20–1.20)
BUN: 10.3 mg/dL (ref 7.0–26.0)
CALCIUM: 9.1 mg/dL (ref 8.4–10.4)
CHLORIDE: 107 meq/L (ref 98–109)
CO2: 23 mEq/L (ref 22–29)
CREATININE: 0.7 mg/dL (ref 0.7–1.3)
EGFR: >90 mL/min/{1.73_m2} (ref >90)
Glucose: 178 mg/dl — ABNORMAL HIGH (ref 70–140)
Potassium: 3.9 mEq/L (ref 3.5–5.1)
Sodium: 140 mEq/L (ref 136–145)
Total Protein: 6.8 g/dL (ref 6.4–8.3)

## 2016-08-25 MED ORDER — SODIUM CHLORIDE 0.9 % IV SOLN
Freq: Once | INTRAVENOUS | Status: AC
  Administered 2016-08-25: 10:00:00 via INTRAVENOUS

## 2016-08-25 MED ORDER — SODIUM CHLORIDE 0.9 % IV SOLN
10.0000 mg | Freq: Once | INTRAVENOUS | Status: AC
  Administered 2016-08-25: 10 mg via INTRAVENOUS
  Filled 2016-08-25: qty 1

## 2016-08-25 MED ORDER — HEPARIN SOD (PORK) LOCK FLUSH 100 UNIT/ML IV SOLN
500.0000 [IU] | Freq: Once | INTRAVENOUS | Status: AC | PRN
  Administered 2016-08-25: 500 [IU]
  Filled 2016-08-25: qty 5

## 2016-08-25 MED ORDER — FAMOTIDINE IN NACL 20-0.9 MG/50ML-% IV SOLN
20.0000 mg | Freq: Once | INTRAVENOUS | Status: AC
  Administered 2016-08-25: 20 mg via INTRAVENOUS

## 2016-08-25 MED ORDER — PACLITAXEL CHEMO INJECTION 300 MG/50ML
80.0000 mg/m2 | Freq: Once | INTRAVENOUS | Status: AC
  Administered 2016-08-25: 162 mg via INTRAVENOUS
  Filled 2016-08-25: qty 27

## 2016-08-25 MED ORDER — FAMOTIDINE IN NACL 20-0.9 MG/50ML-% IV SOLN
INTRAVENOUS | Status: AC
  Filled 2016-08-25: qty 50

## 2016-08-25 MED ORDER — SODIUM CHLORIDE 0.9% FLUSH
10.0000 mL | INTRAVENOUS | Status: DC | PRN
  Administered 2016-08-25: 10 mL
  Filled 2016-08-25: qty 10

## 2016-08-25 MED ORDER — SODIUM CHLORIDE 0.9 % IJ SOLN
10.0000 mL | INTRAMUSCULAR | Status: DC | PRN
  Administered 2016-08-25: 10 mL via INTRAVENOUS
  Filled 2016-08-25: qty 10

## 2016-08-25 MED ORDER — DIPHENHYDRAMINE HCL 50 MG/ML IJ SOLN
INTRAMUSCULAR | Status: AC
  Filled 2016-08-25: qty 1

## 2016-08-25 MED ORDER — DIPHENHYDRAMINE HCL 50 MG/ML IJ SOLN
25.0000 mg | Freq: Once | INTRAMUSCULAR | Status: AC
  Administered 2016-08-25: 25 mg via INTRAVENOUS

## 2016-08-25 NOTE — Patient Instructions (Addendum)

## 2016-08-25 NOTE — Patient Instructions (Signed)
Paclitaxel injection What is this medicine? PACLITAXEL (PAK li TAX el) is a chemotherapy drug. It targets fast dividing cells, like cancer cells, and causes these cells to die. This medicine is used to treat ovarian cancer, breast cancer, and other cancers. This medicine may be used for other purposes; ask your health care provider or pharmacist if you have questions. What should I tell my health care provider before I take this medicine? They need to know if you have any of these conditions: -blood disorders -irregular heartbeat -infection (especially a virus infection such as chickenpox, cold sores, or herpes) -liver disease -previous or ongoing radiation therapy -an unusual or allergic reaction to paclitaxel, alcohol, polyoxyethylated castor oil, other chemotherapy agents, other medicines, foods, dyes, or preservatives -pregnant or trying to get pregnant -breast-feeding How should I use this medicine? This drug is given as an infusion into a vein. It is administered in a hospital or clinic by a specially trained health care professional. Talk to your pediatrician regarding the use of this medicine in children. Special care may be needed. Overdosage: If you think you have taken too much of this medicine contact a poison control center or emergency room at once. NOTE: This medicine is only for you. Do not share this medicine with others. What if I miss a dose? It is important not to miss your dose. Call your doctor or health care professional if you are unable to keep an appointment. What may interact with this medicine? Do not take this medicine with any of the following medications: -disulfiram -metronidazole This medicine may also interact with the following medications: -cyclosporine -diazepam -ketoconazole -medicines to increase blood counts like filgrastim, pegfilgrastim, sargramostim -other chemotherapy drugs like cisplatin, doxorubicin, epirubicin, etoposide, teniposide,  vincristine -quinidine -testosterone -vaccines -verapamil Talk to your doctor or health care professional before taking any of these medicines: -acetaminophen -aspirin -ibuprofen -ketoprofen -naproxen This list may not describe all possible interactions. Give your health care provider a list of all the medicines, herbs, non-prescription drugs, or dietary supplements you use. Also tell them if you smoke, drink alcohol, or use illegal drugs. Some items may interact with your medicine. What should I watch for while using this medicine? Your condition will be monitored carefully while you are receiving this medicine. You will need important blood work done while you are taking this medicine. This drug may make you feel generally unwell. This is not uncommon, as chemotherapy can affect healthy cells as well as cancer cells. Report any side effects. Continue your course of treatment even though you feel ill unless your doctor tells you to stop. This medicine can cause serious allergic reactions. To reduce your risk you will need to take other medicine(s) before treatment with this medicine. In some cases, you may be given additional medicines to help with side effects. Follow all directions for their use. Call your doctor or health care professional for advice if you get a fever, chills or sore throat, or other symptoms of a cold or flu. Do not treat yourself. This drug decreases your body's ability to fight infections. Try to avoid being around people who are sick. This medicine may increase your risk to bruise or bleed. Call your doctor or health care professional if you notice any unusual bleeding. Be careful brushing and flossing your teeth or using a toothpick because you may get an infection or bleed more easily. If you have any dental work done, tell your dentist you are receiving this medicine. Avoid taking products that   contain aspirin, acetaminophen, ibuprofen, naproxen, or ketoprofen unless  instructed by your doctor. These medicines may hide a fever. Do not become pregnant while taking this medicine. Women should inform their doctor if they wish to become pregnant or think they might be pregnant. There is a potential for serious side effects to an unborn child. Talk to your health care professional or pharmacist for more information. Do not breast-feed an infant while taking this medicine. Men are advised not to father a child while receiving this medicine. This product may contain alcohol. Ask your pharmacist or healthcare provider if this medicine contains alcohol. Be sure to tell all healthcare providers you are taking this medicine. Certain medicines, like metronidazole and disulfiram, can cause an unpleasant reaction when taken with alcohol. The reaction includes flushing, headache, nausea, vomiting, sweating, and increased thirst. The reaction can last from 30 minutes to several hours. What side effects may I notice from receiving this medicine? Side effects that you should report to your doctor or health care professional as soon as possible: -allergic reactions like skin rash, itching or hives, swelling of the face, lips, or tongue -low blood counts - This drug may decrease the number of white blood cells, red blood cells and platelets. You may be at increased risk for infections and bleeding. -signs of infection - fever or chills, cough, sore throat, pain or difficulty passing urine -signs of decreased platelets or bleeding - bruising, pinpoint red spots on the skin, black, tarry stools, nosebleeds -signs of decreased red blood cells - unusually weak or tired, fainting spells, lightheadedness -breathing problems -chest pain -high or low blood pressure -mouth sores -nausea and vomiting -pain, swelling, redness or irritation at the injection site -pain, tingling, numbness in the hands or feet -slow or irregular heartbeat -swelling of the ankle, feet, hands Side effects that  usually do not require medical attention (report to your doctor or health care professional if they continue or are bothersome): -bone pain -complete hair loss including hair on your head, underarms, pubic hair, eyebrows, and eyelashes -changes in the color of fingernails -diarrhea -loosening of the fingernails -loss of appetite -muscle or joint pain -red flush to skin -sweating This list may not describe all possible side effects. Call your doctor for medical advice about side effects. You may report side effects to FDA at 1-800-FDA-1088. Where should I keep my medicine? This drug is given in a hospital or clinic and will not be stored at home. NOTE: This sheet is a summary. It may not cover all possible information. If you have questions about this medicine, talk to your doctor, pharmacist, or health care provider.    2016, [employer]. (2015-07-15 13:02:56)  

## 2016-08-30 ENCOUNTER — Other Ambulatory Visit: Payer: Self-pay | Admitting: Oncology

## 2016-08-31 ENCOUNTER — Other Ambulatory Visit: Payer: Self-pay | Admitting: *Deleted

## 2016-08-31 DIAGNOSIS — C155 Malignant neoplasm of lower third of esophagus: Secondary | ICD-10-CM

## 2016-09-01 ENCOUNTER — Ambulatory Visit (HOSPITAL_BASED_OUTPATIENT_CLINIC_OR_DEPARTMENT_OTHER): Payer: BLUE CROSS/BLUE SHIELD

## 2016-09-01 ENCOUNTER — Encounter: Payer: Self-pay | Admitting: Nurse Practitioner

## 2016-09-01 ENCOUNTER — Other Ambulatory Visit (HOSPITAL_BASED_OUTPATIENT_CLINIC_OR_DEPARTMENT_OTHER): Payer: BLUE CROSS/BLUE SHIELD

## 2016-09-01 ENCOUNTER — Ambulatory Visit (HOSPITAL_BASED_OUTPATIENT_CLINIC_OR_DEPARTMENT_OTHER): Payer: BLUE CROSS/BLUE SHIELD | Admitting: Nurse Practitioner

## 2016-09-01 VITALS — BP 120/73 | HR 104 | Temp 99.0°F | Resp 18

## 2016-09-01 DIAGNOSIS — Z5111 Encounter for antineoplastic chemotherapy: Secondary | ICD-10-CM

## 2016-09-01 DIAGNOSIS — Z5112 Encounter for antineoplastic immunotherapy: Secondary | ICD-10-CM

## 2016-09-01 DIAGNOSIS — C155 Malignant neoplasm of lower third of esophagus: Secondary | ICD-10-CM

## 2016-09-01 DIAGNOSIS — R530 Neoplastic (malignant) related fatigue: Secondary | ICD-10-CM

## 2016-09-01 LAB — CBC WITH DIFFERENTIAL/PLATELET
BASO%: 3.3 % — ABNORMAL HIGH (ref 0.0–2.0)
Basophils Absolute: 0.1 10*3/uL (ref 0.0–0.1)
EOS%: 10.5 % — ABNORMAL HIGH (ref 0.0–7.0)
Eosinophils Absolute: 0.2 10*3/uL (ref 0.0–0.5)
HCT: 33.3 % — ABNORMAL LOW (ref 38.4–49.9)
HGB: 10.7 g/dL — ABNORMAL LOW (ref 13.0–17.1)
LYMPH%: 21.5 % (ref 14.0–49.0)
MCH: 29.6 pg (ref 27.2–33.4)
MCHC: 32.1 g/dL (ref 32.0–36.0)
MCV: 92.2 fL (ref 79.3–98.0)
MONO#: 0.2 10*3/uL (ref 0.1–0.9)
MONO%: 8.6 % (ref 0.0–14.0)
NEUT#: 1.2 10*3/uL — ABNORMAL LOW (ref 1.5–6.5)
NEUT%: 56.1 % (ref 39.0–75.0)
Platelets: 191 10*3/uL (ref 140–400)
RBC: 3.61 10*6/uL — ABNORMAL LOW (ref 4.20–5.82)
RDW: 17.1 % — ABNORMAL HIGH (ref 11.0–14.6)
WBC: 2.1 10*3/uL — ABNORMAL LOW (ref 4.0–10.3)
lymph#: 0.5 10*3/uL — ABNORMAL LOW (ref 0.9–3.3)

## 2016-09-01 LAB — TECHNOLOGIST REVIEW

## 2016-09-01 MED ORDER — DIPHENHYDRAMINE HCL 50 MG/ML IJ SOLN
INTRAMUSCULAR | Status: AC
  Filled 2016-09-01: qty 1

## 2016-09-01 MED ORDER — FAMOTIDINE IN NACL 20-0.9 MG/50ML-% IV SOLN
INTRAVENOUS | Status: AC
  Filled 2016-09-01: qty 50

## 2016-09-01 MED ORDER — ACETAMINOPHEN 325 MG PO TABS
ORAL_TABLET | ORAL | Status: AC
  Filled 2016-09-01: qty 2

## 2016-09-01 MED ORDER — SODIUM CHLORIDE 0.9% FLUSH
10.0000 mL | INTRAVENOUS | Status: DC | PRN
  Administered 2016-09-01: 10 mL
  Filled 2016-09-01: qty 10

## 2016-09-01 MED ORDER — SODIUM CHLORIDE 0.9 % IV SOLN
8.0000 mg/kg | Freq: Once | INTRAVENOUS | Status: AC
  Administered 2016-09-01: 600 mg via INTRAVENOUS
  Filled 2016-09-01: qty 50

## 2016-09-01 MED ORDER — ACETAMINOPHEN 160 MG/5ML PO SOLN
650.0000 mg | Freq: Once | ORAL | Status: AC
  Administered 2016-09-01: 650 mg via ORAL
  Filled 2016-09-01: qty 20.3

## 2016-09-01 MED ORDER — SODIUM CHLORIDE 0.9 % IV SOLN
10.0000 mg | Freq: Once | INTRAVENOUS | Status: AC
  Administered 2016-09-01: 10 mg via INTRAVENOUS
  Filled 2016-09-01: qty 1

## 2016-09-01 MED ORDER — HEPARIN SOD (PORK) LOCK FLUSH 100 UNIT/ML IV SOLN
500.0000 [IU] | Freq: Once | INTRAVENOUS | Status: AC | PRN
  Administered 2016-09-01: 500 [IU]
  Filled 2016-09-01: qty 5

## 2016-09-01 MED ORDER — FAMOTIDINE IN NACL 20-0.9 MG/50ML-% IV SOLN
20.0000 mg | Freq: Once | INTRAVENOUS | Status: AC
  Administered 2016-09-01: 20 mg via INTRAVENOUS

## 2016-09-01 MED ORDER — SODIUM CHLORIDE 0.9 % IV SOLN
Freq: Once | INTRAVENOUS | Status: AC
  Administered 2016-09-01: 10:00:00 via INTRAVENOUS

## 2016-09-01 MED ORDER — PACLITAXEL CHEMO INJECTION 300 MG/50ML
80.0000 mg/m2 | Freq: Once | INTRAVENOUS | Status: AC
  Administered 2016-09-01: 162 mg via INTRAVENOUS
  Filled 2016-09-01: qty 27

## 2016-09-01 MED ORDER — DIPHENHYDRAMINE HCL 50 MG/ML IJ SOLN
25.0000 mg | Freq: Once | INTRAMUSCULAR | Status: AC
  Administered 2016-09-01: 25 mg via INTRAVENOUS

## 2016-09-01 NOTE — Progress Notes (Signed)
Per Dr. Benay Spice, [facility] to treat with [place] of 1.2.

## 2016-09-01 NOTE — Patient Instructions (Signed)
[facility] Discharge Instructions for Patients Receiving Chemotherapy  Today you received the following chemotherapy agents:  Taxol, Cyramza  To help prevent nausea and vomiting after your treatment, we encourage you to take your nausea medication as prescribed.   If you develop nausea and vomiting that is not controlled by your nausea medication, call the clinic.   BELOW ARE SYMPTOMS THAT SHOULD BE REPORTED IMMEDIATELY:  *FEVER GREATER THAN 100.5 F  *CHILLS WITH OR WITHOUT FEVER  NAUSEA AND VOMITING THAT IS NOT CONTROLLED WITH YOUR NAUSEA MEDICATION  *UNUSUAL SHORTNESS OF BREATH  *UNUSUAL BRUISING OR BLEEDING  TENDERNESS IN MOUTH AND THROAT WITH OR WITHOUT PRESENCE OF ULCERS  *URINARY PROBLEMS  *BOWEL PROBLEMS  UNUSUAL RASH Items with * indicate a potential emergency and should be followed up as soon as possible.  Feel free to call the clinic you have any questions or concerns. The clinic phone number is [phone].  Please show the [place] at check-in to the Emergency Department and triage nurse.

## 2016-09-01 NOTE — Progress Notes (Signed)
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 male diagnosed with esophageal cancer.  Currently undergoing Taxol/cyramza chemotherapy regimen.     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        Review of Systems  Constitutional: Positive for malaise/fatigue and weight loss.  HENT:       Patient has a trach as baseline.  All other systems reviewed and are negative.   Past Medical History:  Diagnosis Date  . Anxiety   . Colon polyp   . Diverticulosis   . 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 GERD (gastroesophageal reflux disease)   . History of radiation therapy 04/07/13-05-15-13   [place],50.4Gy/35f [place]  . Hyperlipemia   . Pre-diabetes   . Shortness of breath dyspnea   . Sleep apnea    no c-pap at this time    Past Surgical History:  Procedure Laterality Date  . APPENDECTOMY    . BALLOON DILATION N/A 06/05/2016   Procedure: BALLOON DILATION;  Surgeon: JIrene Shipper MD;  [facility];  Service: Endoscopy;  Laterality: N/A;  . COLONOSCOPY    . COMPLETE ESOPHAGECTOMY N/A 07/07/2013   Procedure: ESOPHAGECTOMY COMPLETE;  Surgeon: EGrace Isaac MD;  [place]  Service: Thoracic;  Laterality: N/A;  transhiatel esophagectomy, feeding jejunostomy  . EDG  03/18/2013   Esophageal Mass  . ESOPHAGOGASTRODUODENOSCOPY N/A 07/02/2014   Procedure: ESOPHAGOGASTRODUODENOSCOPY (EGD);  Surgeon: CGatha Mayer MD;  [facility];  Service: Endoscopy;  Laterality: N/A;  . ESOPHAGOGASTRODUODENOSCOPY N/A 02/16/2015   Procedure: ESOPHAGOGASTRODUODENOSCOPY (EGD);  Surgeon: DLafayette Dragon MD;  [facility];  Service: Endoscopy;  Laterality: N/A;    . ESOPHAGOGASTRODUODENOSCOPY N/A 09/29/2015   Procedure: ESOPHAGOGASTRODUODENOSCOPY (EGD);  Surgeon: MLadene Artist MD;  [facility];  Service: Endoscopy;  Laterality: N/A;  . ESOPHAGOGASTRODUODENOSCOPY N/A 06/05/2016   Procedure: ESOPHAGOGASTRODUODENOSCOPY (EGD);  Surgeon: JIrene Shipper MD;  [facility];  Service: Endoscopy;  Laterality: N/A;  . EUS N/A 06/25/2014   Procedure: UPPER ENDOSCOPIC ULTRASOUND (EUS) LINEAR;  Surgeon: DMilus Banister MD;  [facility];  Service: Endoscopy;  Laterality: N/A;  . LIPOMA EXCISION    . SUBXYPHOID PERICARDIAL WINDOW N/A 04/21/2016   Procedure: SUBXYPHOID PERICARDIAL WINDOW;  Surgeon: SMelrose Nakayama MD;  [facility] [facility]  Service: Thoracic;  Laterality: N/A;  . UPPER GASTROINTESTINAL ENDOSCOPY    . VIDEO BRONCHOSCOPY N/A 07/07/2013   Procedure: VIDEO BRONCHOSCOPY;  Surgeon: EGrace Isaac MD;  [facility];  Service: Thoracic;  Laterality: N/A;  . vocal chord surgery     pushed right vocal cord more centered- September 2015    has Cancer of distal third of esophagus ([place]; HCAP (healthcare-associated pneumonia); Acute respiratory failure with hypoxia ([place]; Pericardial effusion with cardiac tamponade; Leukocytosis; Anemia of chronic disease; Hilar adenopathy; Lymphatic obstruction; Elevated liver enzymes; Encounter for chest tube removal; Atrial fibrillation, new onset ([place]; Knee pain; Pain; Chest pain; Dyspnea; Paroxysmal atrial fibrillation ([place]; Mass in chest; Malignant neoplasm of esophagus ([place]; Port catheter in place; Dysphagia; and Esophageal stricture on his problem list.    is allergic to lactose intolerance (gi).    Medication List  Accurate as of 09/01/16  7:15 PM. Always use your most recent med list.          acetaminophen 500 MG tablet Commonly known as:  TYLENOL Take 1,000 mg by mouth every 6 (six) hours as needed for mild pain or moderate pain.   ALPRAZolam 0.5 MG  tablet Commonly known as:  XANAX TAKE 1 TABLET BY MOUTH 3 TIMES DAILY AS NEEDED FOR ANXIETY   benzonatate 100 MG capsule Commonly known as:  TESSALON Take 1 capsule (100 mg total) by mouth 3 (three) times daily as needed for cough.   ciprofloxacin 500 MG tablet Commonly known as:  CIPRO Take 1 tablet (500 mg total) by mouth 2 (two) times daily.   eszopiclone 1 MG Tabs tablet Commonly known as:  LUNESTA Take 1 tablet (1 mg total) by mouth at bedtime as needed for sleep. for sleep   ibuprofen 200 MG tablet Commonly known as:  ADVIL,MOTRIN Take 200 mg by mouth every 8 (eight) hours as needed. Reported on 05/12/2016   lactase 3000 units tablet Commonly known as:  LACTAID Take 3,000 Units by mouth daily as needed (lactose intolerance). Reported on 05/12/2016   lidocaine-prilocaine cream Commonly known as:  EMLA Apply 1 application topically as needed. Apply tablespoon to PAC 2 hours prior to stick and cover with plastic wrap   mirtazapine 15 MG tablet Commonly known as:  REMERON Take 1 tablet (15 mg total) by mouth at bedtime.   multivitamin with minerals Tabs tablet Take 1 tablet by mouth daily.   vitamin C 1000 MG tablet Take 1,000 mg by mouth 3 (three) times daily.        PHYSICAL EXAMINATION  Oncology Vitals 09/01/2016 08/25/2016  Height - -  Weight - -  Weight (lbs) - -  BMI (kg/m2) - -  Temp 99 98.6  Pulse 104 87  Resp 18 18  SpO2 100 99  BSA (m2) - -   BP Readings from Last 2 Encounters:  09/01/16 120/73  08/25/16 112/67    Physical Exam  Constitutional: He is oriented to person, place, and time. Vital signs are normal. He appears malnourished. He appears unhealthy. He appears cachectic.  HENT:  Head: Normocephalic and atraumatic.  Patient has a trach as baseline.  He was observed both eating and drinking with no difficulty today.  Eyes: Conjunctivae and EOM are normal. Pupils are equal, round, and reactive to light.  Neck: Normal range of motion.   Pulmonary/Chest: Effort normal. No respiratory distress.  Musculoskeletal: Normal range of motion.  Neurological: He is alert and oriented to person, place, and time.  Skin: Skin is warm and dry. There is pallor.  Psychiatric: Affect normal.  Nursing note and vitals reviewed.   LABORATORY DATA:. Appointment on 09/01/2016  Component Date Value Ref Range Status  . WBC 09/01/2016 2.1* 4.0 - 10.3 10e3/uL Final  . NEUT# 09/01/2016 1.2* 1.5 - 6.5 10e3/uL Final  . HGB 09/01/2016 10.7* 13.0 - 17.1 g/dL Final  . HCT 09/01/2016 33.3* 38.4 - 49.9 % Final  . Platelets 09/01/2016 191  140 - 400 10e3/uL Final  . MCV 09/01/2016 92.2  79.3 - 98.0 fL Final  . MCH 09/01/2016 29.6  27.2 - 33.4 pg Final  . MCHC 09/01/2016 32.1  32.0 - 36.0 g/dL Final  . RBC 09/01/2016 3.61* 4.20 - 5.82 10e6/uL Final  . RDW 09/01/2016 17.1* 11.0 - 14.6 % Final  . lymph# 09/01/2016 0.5* 0.9 - 3.3 10e3/uL Final  . MONO# 09/01/2016 0.2  0.1 - 0.9 10e3/uL Final  . Eosinophils Absolute 09/01/2016 0.2  0.0 - 0.5 10e3/uL Final  . Basophils Absolute 09/01/2016 0.1  0.0 - 0.1 10e3/uL Final  . NEUT% 09/01/2016 56.1  39.0 - 75.0 % Final  . LYMPH% 09/01/2016 21.5  14.0 - 49.0 % Final  . MONO% 09/01/2016 8.6  0.0 - 14.0 % Final  . EOS% 09/01/2016 10.5* 0.0 - 7.0 % Final  . BASO% 09/01/2016 3.3* 0.0 - 2.0 % Final  . Technologist Review 09/01/2016 Rare metamyelocyte   Final    RADIOGRAPHIC STUDIES: No results found.  ASSESSMENT/PLAN:    Cancer of distal third of esophagus [facility]) Patient presented to the [place] today to receive both his Taxol and his cyramza.  Patient states that approximate 2 days ago, he did have a fever to maximum 100.0.  He states that he has had no fever since that time.  He does admit to feeling tired; but denies any URI or UTI symptoms.  He stated that he felt well enough to proceed with his chemotherapy today as planned.  Brief exam of the patient revealed.  Patient appeared fatigued; but  nontoxic.  Vital signs were stable and patient was afebrile.  Labs obtained today reveal a WBC of 2.1, ANC 1.2, hemoglobin 10.7, and platelet count 191.  Reviewed all findings with Dr. Benay Spice; he was in agreement with patient proceeding with this chemotherapy today as planned.  Patient was advised to call/return or go directly to the emergency department for any worsening symptoms whatsoever.   Patient stated understanding of all instructions; and was in agreement with this plan of care. The patient knows to call the clinic with any problems, questions or concerns.   Total time spent with patient was 15 minutes;  with greater than 75 percent of that time spent in face to face counseling regarding patient's symptoms,  and coordination of care and follow up.  Disclaimer:This dictation was prepared with Dragon/digital dictation along with [employer]. Any transcriptional errors that result from this process are unintentional.  Drue Second, NP 09/01/2016

## 2016-09-01 NOTE — Assessment & Plan Note (Signed)
Patient presented to the [place] today to receive both his Taxol and his cyramza.  Patient states that approximate 2 days ago, he did have a fever to maximum 100.0.  He states that he has had no fever since that time.  He does admit to feeling tired; but denies any URI or UTI symptoms.  He stated that he felt well enough to proceed with his chemotherapy today as planned.  Brief exam of the patient revealed.  Patient appeared fatigued; but nontoxic.  Vital signs were stable and patient was afebrile.  Labs obtained today reveal a WBC of 2.1, ANC 1.2, hemoglobin 10.7, and platelet count 191.  Reviewed all findings with Dr. Benay Spice; he was in agreement with patient proceeding with this chemotherapy today as planned.  Patient was advised to call/return or go directly to the emergency department for any worsening symptoms whatsoever.

## 2016-09-07 MED FILL — ESZOPICLONE 2 MG TABLET: 2 | 30 days supply | Qty: 15 | Fill #0

## 2016-09-10 ENCOUNTER — Other Ambulatory Visit: Payer: Self-pay | Admitting: Oncology

## 2016-09-12 ENCOUNTER — Other Ambulatory Visit: Payer: Self-pay | Admitting: *Deleted

## 2016-09-12 DIAGNOSIS — C155 Malignant neoplasm of lower third of esophagus: Secondary | ICD-10-CM

## 2016-09-13 ENCOUNTER — Telehealth: Payer: Self-pay | Admitting: *Deleted

## 2016-09-13 ENCOUNTER — Ambulatory Visit (HOSPITAL_BASED_OUTPATIENT_CLINIC_OR_DEPARTMENT_OTHER): Payer: BLUE CROSS/BLUE SHIELD

## 2016-09-13 ENCOUNTER — Telehealth: Payer: Self-pay | Admitting: Oncology

## 2016-09-13 ENCOUNTER — Ambulatory Visit: Payer: BLUE CROSS/BLUE SHIELD

## 2016-09-13 ENCOUNTER — Other Ambulatory Visit (HOSPITAL_BASED_OUTPATIENT_CLINIC_OR_DEPARTMENT_OTHER): Payer: BLUE CROSS/BLUE SHIELD

## 2016-09-13 ENCOUNTER — Ambulatory Visit (HOSPITAL_BASED_OUTPATIENT_CLINIC_OR_DEPARTMENT_OTHER): Payer: BLUE CROSS/BLUE SHIELD | Admitting: Oncology

## 2016-09-13 ENCOUNTER — Other Ambulatory Visit: Payer: Self-pay | Admitting: *Deleted

## 2016-09-13 VITALS — BP 111/75 | HR 92 | Temp 98.6°F | Resp 17 | Ht 73.0 in | Wt 183.2 lb

## 2016-09-13 DIAGNOSIS — Z23 Encounter for immunization: Secondary | ICD-10-CM

## 2016-09-13 DIAGNOSIS — G620 Drug-induced polyneuropathy: Secondary | ICD-10-CM

## 2016-09-13 DIAGNOSIS — C155 Malignant neoplasm of lower third of esophagus: Secondary | ICD-10-CM | POA: Diagnosis not present

## 2016-09-13 DIAGNOSIS — R530 Neoplastic (malignant) related fatigue: Secondary | ICD-10-CM

## 2016-09-13 DIAGNOSIS — Z95828 Presence of other vascular implants and grafts: Secondary | ICD-10-CM

## 2016-09-13 DIAGNOSIS — Z5111 Encounter for antineoplastic chemotherapy: Secondary | ICD-10-CM

## 2016-09-13 DIAGNOSIS — Z5112 Encounter for antineoplastic immunotherapy: Secondary | ICD-10-CM | POA: Diagnosis not present

## 2016-09-13 DIAGNOSIS — D696 Thrombocytopenia, unspecified: Secondary | ICD-10-CM | POA: Diagnosis not present

## 2016-09-13 LAB — COMPREHENSIVE METABOLIC PANEL
ALT: <9 U/L (ref 0–55)
AST: 12 U/L (ref 5–34)
Albumin: 3.1 g/dL — ABNORMAL LOW (ref 3.5–5.0)
Alkaline Phosphatase: 81 U/L (ref 40–150)
Anion Gap: 8 mEq/L (ref 3–11)
BUN: 5.8 mg/dL — ABNORMAL LOW (ref 7.0–26.0)
CO2: 25 mEq/L (ref 22–29)
Calcium: 9.2 mg/dL (ref 8.4–10.4)
Chloride: 109 mEq/L (ref 98–109)
Creatinine: 0.7 mg/dL (ref 0.7–1.3)
EGFR: >90 mL/min/{1.73_m2} (ref >90)
Glucose: 105 mg/dl (ref 70–140)
Potassium: 4.3 mEq/L (ref 3.5–5.1)
Sodium: 142 mEq/L (ref 136–145)
Total Bilirubin: <0.3 mg/dL (ref 0.20–1.20)
Total Protein: 6.8 g/dL (ref 6.4–8.3)

## 2016-09-13 LAB — CBC WITH DIFFERENTIAL/PLATELET
BASO%: 0.9 % (ref 0.0–2.0)
Basophils Absolute: 0.1 10*3/uL (ref 0.0–0.1)
EOS%: 2.8 % (ref 0.0–7.0)
Eosinophils Absolute: 0.2 10*3/uL (ref 0.0–0.5)
HCT: 35.6 % — ABNORMAL LOW (ref 38.4–49.9)
HGB: 11.3 g/dL — ABNORMAL LOW (ref 13.0–17.1)
LYMPH%: 12.8 % — ABNORMAL LOW (ref 14.0–49.0)
MCH: 29.4 pg (ref 27.2–33.4)
MCHC: 31.7 g/dL — ABNORMAL LOW (ref 32.0–36.0)
MCV: 92.5 fL (ref 79.3–98.0)
MONO#: 0.8 10*3/uL (ref 0.1–0.9)
MONO%: 14.4 % — ABNORMAL HIGH (ref 0.0–14.0)
NEUT#: 3.9 10*3/uL (ref 1.5–6.5)
NEUT%: 69.1 % (ref 39.0–75.0)
Platelets: 216 10*3/uL (ref 140–400)
RBC: 3.85 10*6/uL — ABNORMAL LOW (ref 4.20–5.82)
RDW: 17.6 % — ABNORMAL HIGH (ref 11.0–14.6)
WBC: 5.7 10*3/uL (ref 4.0–10.3)
lymph#: 0.7 10*3/uL — ABNORMAL LOW (ref 0.9–3.3)

## 2016-09-13 LAB — UA PROTEIN, DIPSTICK - CHCC: Protein, ur: NEGATIVE mg/dL

## 2016-09-13 LAB — CEA (IN HOUSE-CHCC): CEA (CHCC-In House): 48.03 ng/mL — ABNORMAL HIGH (ref 0.00–5.00)

## 2016-09-13 MED ORDER — SODIUM CHLORIDE 0.9 % IV SOLN
8.0000 mg/kg | Freq: Once | INTRAVENOUS | Status: AC
  Administered 2016-09-13: 600 mg via INTRAVENOUS
  Filled 2016-09-13: qty 50

## 2016-09-13 MED ORDER — ACETAMINOPHEN 325 MG PO TABS
ORAL_TABLET | ORAL | Status: AC
  Filled 2016-09-13: qty 2

## 2016-09-13 MED ORDER — DIPHENHYDRAMINE HCL 50 MG/ML IJ SOLN
25.0000 mg | Freq: Once | INTRAMUSCULAR | Status: AC
  Administered 2016-09-13: 25 mg via INTRAVENOUS

## 2016-09-13 MED ORDER — ACETAMINOPHEN 160 MG/5ML PO SOLN
650.0000 mg | Freq: Once | ORAL | Status: AC
  Administered 2016-09-13: 650 mg via ORAL
  Filled 2016-09-13: qty 20.3

## 2016-09-13 MED ORDER — DEXAMETHASONE SODIUM PHOSPHATE 100 MG/10ML IJ SOLN
10.0000 mg | Freq: Once | INTRAMUSCULAR | Status: AC
  Administered 2016-09-13: 10 mg via INTRAVENOUS
  Filled 2016-09-13: qty 1

## 2016-09-13 MED ORDER — SODIUM CHLORIDE 0.9% FLUSH
10.0000 mL | INTRAVENOUS | Status: DC | PRN
  Administered 2016-09-13: 10 mL
  Filled 2016-09-13: qty 10

## 2016-09-13 MED ORDER — SODIUM CHLORIDE 0.9 % IJ SOLN
10.0000 mL | INTRAMUSCULAR | Status: DC | PRN
  Administered 2016-09-13: 10 mL via INTRAVENOUS
  Filled 2016-09-13: qty 10

## 2016-09-13 MED ORDER — HEPARIN SOD (PORK) LOCK FLUSH 100 UNIT/ML IV SOLN
500.0000 [IU] | Freq: Once | INTRAVENOUS | Status: AC | PRN
  Administered 2016-09-13: 500 [IU]
  Filled 2016-09-13: qty 5

## 2016-09-13 MED ORDER — DIPHENHYDRAMINE HCL 50 MG/ML IJ SOLN
INTRAMUSCULAR | Status: AC
  Filled 2016-09-13: qty 1

## 2016-09-13 MED ORDER — MIRTAZAPINE 15 MG PO TABS: 15.0000 mg | ORAL_TABLET | Freq: Every day | ORAL | 1 refills | Status: DC

## 2016-09-13 MED ORDER — SODIUM CHLORIDE 0.9 % IV SOLN
80.0000 mg/m2 | Freq: Once | INTRAVENOUS | Status: AC
  Administered 2016-09-13: 162 mg via INTRAVENOUS
  Filled 2016-09-13: qty 27

## 2016-09-13 MED ORDER — FAMOTIDINE IN NACL 20-0.9 MG/50ML-% IV SOLN
INTRAVENOUS | Status: AC
  Filled 2016-09-13: qty 50

## 2016-09-13 MED ORDER — PALONOSETRON HCL INJECTION 0.25 MG/5ML
INTRAVENOUS | Status: AC
  Filled 2016-09-13: qty 5

## 2016-09-13 MED ORDER — SODIUM CHLORIDE 0.9 % IV SOLN
Freq: Once | INTRAVENOUS | Status: AC
  Administered 2016-09-13: 11:00:00 via INTRAVENOUS

## 2016-09-13 MED ORDER — FAMOTIDINE IN NACL 20-0.9 MG/50ML-% IV SOLN
20.0000 mg | Freq: Once | INTRAVENOUS | Status: AC
  Administered 2016-09-13: 20 mg via INTRAVENOUS

## 2016-09-13 MED ORDER — INFLUENZA VAC SPLIT QUAD 0.5 ML IM SUSY
0.5000 mL | PREFILLED_SYRINGE | Freq: Once | INTRAMUSCULAR | Status: AC
  Administered 2016-09-13: 0.5 mL via INTRAMUSCULAR
  Filled 2016-09-13: qty 0.5

## 2016-09-13 NOTE — Telephone Encounter (Signed)
Per LOS I have scheduled appts and notified the scheduler 

## 2016-09-13 NOTE — Patient Instructions (Signed)
[facility] Discharge Instructions for Patients Receiving Chemotherapy  Today you received the following chemotherapy agents:  Taxol and Cyramza.  To help prevent nausea and vomiting after your treatment, we encourage you to take your nausea medication as prescribed.   If you develop nausea and vomiting that is not controlled by your nausea medication, call the clinic.   BELOW ARE SYMPTOMS THAT SHOULD BE REPORTED IMMEDIATELY:  *FEVER GREATER THAN 100.5 F  *CHILLS WITH OR WITHOUT FEVER  NAUSEA AND VOMITING THAT IS NOT CONTROLLED WITH YOUR NAUSEA MEDICATION  *UNUSUAL SHORTNESS OF BREATH  *UNUSUAL BRUISING OR BLEEDING  TENDERNESS IN MOUTH AND THROAT WITH OR WITHOUT PRESENCE OF ULCERS  *URINARY PROBLEMS  *BOWEL PROBLEMS  UNUSUAL RASH Items with * indicate a potential emergency and should be followed up as soon as possible.  Feel free to call the clinic you have any questions or concerns. The clinic phone number is [phone].  Please show the [place] at check-in to the Emergency Department and triage nurse.

## 2016-09-13 NOTE — Progress Notes (Signed)
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 He was last treated with systemic therapy on 09/01/2016. He reports no change in neuropathy symptoms. He is eating well. He reports malaise after working in a [employer] "last weekend.   Objective:  Vital signs in last 24 hours:  Blood pressure 111/75, pulse 92, temperature 98.6 F (37 C), temperature source Oral, resp. rate 17, height '6\' 1"'$  (1.854 m), weight 183 lb 3.2 oz (83.1 kg), SpO2 99 %.    HEENT:  no thrush Lymphatics:  no supraclavicular nodes Resp:  good air movement bilaterally, no respiratory distress Cardio:  regular rate and rhythm GI:  no hepatomegaly, nontender Vascular:  no leg edema neurologic: Mild loss of vibratory sense at the fingertips bilaterally  Portacath/PICC-without erythema  Lab Results:  Lab Results  Component Value Date   WBC 5.7 09/13/2016   HGB 11.3 (L) 09/13/2016   HCT 35.6 (L) 09/13/2016   MCV 92.5 09/13/2016   PLT 216 09/13/2016   NEUTROABS 3.9 09/13/2016    CEA on 09/13/2016: 48.03    Medications: I have reviewed the patient's current medications.  Assessment/Plan: 1.Adenocarcinoma of the distal esophagus/gastric cardia-status post an endoscopic biopsy on 03/18/2013 confirming adenocarcinoma, HER-2/neunot amplified  -Staging CT scans 03/20/2013 confirmed a distal esophageal/gastric cardia mass, distal paraesophageal lymph nodes and paratracheal nodes concerning for metastatic lymphadenopathy  -A PET scan 03/27/2013 with a multifocal area of hypermetabolic activity involving the thoracic esophagus extending into the gastric cardia and hypermetabolic mediastinal nodes, no distant metastatic disease  -Initiation of radiation 04/07/2013 and weekly Taxol/carboplatin 04/08/2013, radiation completed 05/15/2013  -Restaging PET scan 06/09/2013-no evidence of metastatic disease, no hypermetabolic  lymphadenopathy  -Esophagogastrectomy 07/07/2013 revealed a ypT1b,ypN0 tumor with negative surgical margins  -CT neck 06/09/2014 revealed a necrotic lymph node in the right tracheoesophageal groove  -PET scan 11/15/2014 revealed a single focus of hypermetabolism corresponding to the necrotic right upper mediastinum lymph node -EUS biopsy of the paraesophageal lymph node 06/25/2012 confirmed metastatic adenocarcinoma  -Radiation completed 08/11/2014 -Rising CEA May 2016 -PET scan 07/13/2015 with hypermetabolic right paratracheal and right hilar adenopathy -Cycle 1 FOLFOX 08/03/2015 -Cycle 2 FOLFOX 08/17/2015 - cycle 3 FOLFOX 08/31/2015 (oxaliplatin dose reduced secondary to thrombocytopenia)  -Cycle 4 FOLFOX 09/14/2015 -Cycle 5 FOLFOX 09/28/2015 - restaging CTs 09/30/2015 with stable disease involving neck/chest lymph nodes, a slight increase in size of left upper lobe lung nodule  - cycle 6 FOLFOX 10/12/2015  -FOLFOX discontinued secondary to development of bilateral vocal cord paralysis November 2016 - CT scan of the neck and chest on 11/22/15 revealed no definite disease in the neck. At the thoracic inlet region, there is questionably slight increase in prominence of soft tissue at the right tracheoesophageal groove. This could be an enlarging node or mass but could also relate to the esophagectomy and pull-through surgery.Enlarging lingular and left lower lobe nodules are worrisome for metastatic disease. Peribronchovascular nodular consolidation in the anterior right lower lobe, slightly more prominent, indeterminate. Cholelithiasis. Left diaphragmatic hernia, stable. -Xeloda 1 week on/1 week off beginning 12/11/2015 -Restaging CTs of the neck and chest 02/07/2016 revealed enlargement of 2 left-sided lung nodules, no other evidence of disease progression -Cycle 1 Pembrolizumab 02/25/2016  -Cycle 2 Pembrolizumab 03/17/2016 -Cycle 3 Pembrolizumab 04/07/2016   -CT 04/20/2016  consistent with progressive disease involving a large pericardial effusion, bilateral pleural effusions, enlarged mediastinal lymph nodes and lung nodules  Cytology from the pericardial fluid 04/21/2016-positive for metastatic adenocarcinoma -  CT 05/01/2016 was slight worsening of right hilar lymphadenopathy with near complete collapse of the right middle lobe, nodular metastases in the left lung, stable bilateral pleural effusions  Cycle 1 Taxol/ramucirumab 05/12/2016; day 8 Taxol 05/19/2016; day 15 Taxol/ramucirumab 05/26/2016.  Cycle 2 day 1 Taxol/ramucirumab 06/22/2016  Cycle 2 day 8 Taxol 06/29/2016   Cycle 2 day 15 Taxol/ramucirumab07/28/2017  Cycle 3 day 1 Taxol/ramucirumab 07/20/2016  Cycle 3 day 8 Taxol 07/27/2016  Cycle 3 day 15 Taxol/ramucirumab08/24/2017  Restaging chest CT 08/10/2016-left upper lobe pulmonary nodule similar; left lower lobe pulmonary nodule may have enlarged minimally; improved appearance of the mediastinum and right infrahilar region with decreased soft tissue fullness; improvement in probable left infrahilar adenopathy; decreased left and resolved right pleural effusions; developing right adrenal nodularity; T7 vertebral body lesion suspicious for osseous metastasis.  Cycle 4 Taxol/ramucirumab 08/17/2016    cycle 5 Taxol/ramucirumab 09/13/2016  2. Indeterminate 15 mm right liver lesion on the CT scan 03/20/2013, MRI of the liver 06/09/2013 confirmed a right liver hemangioma  3. Anorexia/weight loss and solid dysphagia secondary to #1 -improved 4. History of a colon polyp, status post a polypectomy October 2013  5. history of Thrombocytopenia secondary chemotherapy-the carboplatin wase dose reduced with week 5 of chemotherapy chemotherapy  6. hoarseness secondary to right vocal cord paralysis-CT/PET scan imaging consistent with a malignant right paratracheal lymph node causing the vocal cord paralysis  -Status post a laryngeal plasty at [facility] on  09/02/2014 7. Esophageal stricture-status post dilation 06/08/2015 -Food impaction removal 09/29/2015 -Esophageal stricture dilation 10/06/2015 8. Port-A-Cath placement 07/20/2015 9. Delayed nausea following cycle 1 FOLFOX. Aloxi and Emend added with cycle 2. 10. Thrombocytopenia secondary to chemotherapy  11. Emotional lability/depression-prophylactic Decadron discontinued; Remeron initiated 12. Progressive hoarseness November 2016, diagnosed with bilateral vocal cord paralysis, status post a tracheostomy and bilateral injection laryngoplasty at [facility] on 10/27/2015 13. Pericardial window 04/21/2016  14. High fever 07/06/2016-chest x-ray suggestive of possible left lower lobe pneumonia-treated with Augmentin      Disposition:   He appears stable. He is tolerating the treatment well and the CEA continues to improve. He'll begin cycle 5 of Taxol/ramucirumab today.  He will return for an office visit in one month. 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  Betsy Coder, MD  09/13/2016  9:52 AM

## 2016-09-13 NOTE — Telephone Encounter (Signed)
Message sent to chemo scheduler to be added. Avs report and appointment schedule given to patient, per 09/13/16 los.

## 2016-09-14 MED FILL — MIRTAZAPINE 15 MG TABLET: 15 | 30 days supply | Qty: 30 | Fill #0

## 2016-09-17 ENCOUNTER — Other Ambulatory Visit: Payer: Self-pay | Admitting: Oncology

## 2016-09-21 ENCOUNTER — Other Ambulatory Visit (HOSPITAL_BASED_OUTPATIENT_CLINIC_OR_DEPARTMENT_OTHER): Payer: BLUE CROSS/BLUE SHIELD

## 2016-09-21 ENCOUNTER — Ambulatory Visit: Payer: BLUE CROSS/BLUE SHIELD

## 2016-09-21 ENCOUNTER — Ambulatory Visit (HOSPITAL_BASED_OUTPATIENT_CLINIC_OR_DEPARTMENT_OTHER): Payer: BLUE CROSS/BLUE SHIELD

## 2016-09-21 VITALS — BP 116/76 | HR 86 | Temp 98.9°F | Resp 17

## 2016-09-21 DIAGNOSIS — C155 Malignant neoplasm of lower third of esophagus: Secondary | ICD-10-CM

## 2016-09-21 DIAGNOSIS — Z95828 Presence of other vascular implants and grafts: Secondary | ICD-10-CM

## 2016-09-21 DIAGNOSIS — Z5111 Encounter for antineoplastic chemotherapy: Secondary | ICD-10-CM | POA: Diagnosis not present

## 2016-09-21 DIAGNOSIS — Z23 Encounter for immunization: Secondary | ICD-10-CM

## 2016-09-21 LAB — CBC WITH DIFFERENTIAL/PLATELET
BASO%: 0.6 % (ref 0.0–2.0)
Basophils Absolute: 0 10*3/uL (ref 0.0–0.1)
EOS%: 1.9 % (ref 0.0–7.0)
Eosinophils Absolute: 0.1 10*3/uL (ref 0.0–0.5)
HCT: 34 % — ABNORMAL LOW (ref 38.4–49.9)
HGB: 10.7 g/dL — ABNORMAL LOW (ref 13.0–17.1)
LYMPH%: 10.8 % — AB (ref 14.0–49.0)
MCH: 28.6 pg (ref 27.2–33.4)
MCHC: 31.5 g/dL — AB (ref 32.0–36.0)
MCV: 90.9 fL (ref 79.3–98.0)
MONO#: 0.4 10*3/uL (ref 0.1–0.9)
MONO%: 6.4 % (ref 0.0–14.0)
NEUT%: 80.3 % — AB (ref 39.0–75.0)
NEUTROS ABS: 5.5 10*3/uL (ref 1.5–6.5)
Platelets: 190 10*3/uL (ref 140–400)
RBC: 3.74 10*6/uL — AB (ref 4.20–5.82)
RDW: 17.3 % — ABNORMAL HIGH (ref 11.0–14.6)
WBC: 6.8 10*3/uL (ref 4.0–10.3)
lymph#: 0.7 10*3/uL — ABNORMAL LOW (ref 0.9–3.3)

## 2016-09-21 MED ORDER — HEPARIN SOD (PORK) LOCK FLUSH 100 UNIT/ML IV SOLN
500.0000 [IU] | Freq: Once | INTRAVENOUS | Status: AC | PRN
  Administered 2016-09-21: 500 [IU]
  Filled 2016-09-21: qty 5

## 2016-09-21 MED ORDER — SODIUM CHLORIDE 0.9 % IJ SOLN
10.0000 mL | INTRAMUSCULAR | Status: DC | PRN
  Administered 2016-09-21: 10 mL via INTRAVENOUS
  Filled 2016-09-21: qty 10

## 2016-09-21 MED ORDER — DIPHENHYDRAMINE HCL 50 MG/ML IJ SOLN
25.0000 mg | Freq: Once | INTRAMUSCULAR | Status: AC
  Administered 2016-09-21: 25 mg via INTRAVENOUS

## 2016-09-21 MED ORDER — DIPHENHYDRAMINE HCL 50 MG/ML IJ SOLN
INTRAMUSCULAR | Status: AC
  Filled 2016-09-21: qty 1

## 2016-09-21 MED ORDER — SODIUM CHLORIDE 0.9 % IV SOLN
Freq: Once | INTRAVENOUS | Status: AC
  Administered 2016-09-21: 10:00:00 via INTRAVENOUS

## 2016-09-21 MED ORDER — FAMOTIDINE IN NACL 20-0.9 MG/50ML-% IV SOLN
20.0000 mg | Freq: Once | INTRAVENOUS | Status: AC
  Administered 2016-09-21: 20 mg via INTRAVENOUS

## 2016-09-21 MED ORDER — SODIUM CHLORIDE 0.9 % IV SOLN
10.0000 mg | Freq: Once | INTRAVENOUS | Status: AC
  Administered 2016-09-21: 10 mg via INTRAVENOUS
  Filled 2016-09-21: qty 1

## 2016-09-21 MED ORDER — SODIUM CHLORIDE 0.9% FLUSH
10.0000 mL | INTRAVENOUS | Status: DC | PRN
  Administered 2016-09-21: 10 mL
  Filled 2016-09-21: qty 10

## 2016-09-21 MED ORDER — FAMOTIDINE IN NACL 20-0.9 MG/50ML-% IV SOLN
INTRAVENOUS | Status: AC
  Filled 2016-09-21: qty 50

## 2016-09-21 MED ORDER — PACLITAXEL CHEMO INJECTION 300 MG/50ML
80.0000 mg/m2 | Freq: Once | INTRAVENOUS | Status: AC
  Administered 2016-09-21: 162 mg via INTRAVENOUS
  Filled 2016-09-21: qty 27

## 2016-09-21 NOTE — Progress Notes (Signed)
OK to treat with CMET from 09/13/16 per Dr. Benay Spice.  Dr. Benay Spice notified of "pins and needles" sensation on scalp. Per infusion RN, Aldona Bar, pt denies headaches or vision changes. Per MD: Monitor for now. Call if it persists/ worsens.

## 2016-09-21 NOTE — Patient Instructions (Signed)
[facility] Discharge Instructions for Patients Receiving Chemotherapy  Today you received the following chemotherapy agents:  Taxol  To help prevent nausea and vomiting after your treatment, we encourage you to take your nausea medication as prescribed.   If you develop nausea and vomiting that is not controlled by your nausea medication, call the clinic.   BELOW ARE SYMPTOMS THAT SHOULD BE REPORTED IMMEDIATELY:  *FEVER GREATER THAN 100.5 F  *CHILLS WITH OR WITHOUT FEVER  NAUSEA AND VOMITING THAT IS NOT CONTROLLED WITH YOUR NAUSEA MEDICATION  *UNUSUAL SHORTNESS OF BREATH  *UNUSUAL BRUISING OR BLEEDING  TENDERNESS IN MOUTH AND THROAT WITH OR WITHOUT PRESENCE OF ULCERS  *URINARY PROBLEMS  *BOWEL PROBLEMS  UNUSUAL RASH Items with * indicate a potential emergency and should be followed up as soon as possible.  Feel free to call the clinic you have any questions or concerns. The clinic phone number is [phone].  Please show the CHEMO ALERT CARD at check-in to the Emergency Department and triage nurse.   

## 2016-09-21 NOTE — Progress Notes (Signed)
Per Dr. Benay Spice okay to tx today with CMET from 09/13/16. Pt complaint of scalp pins and needles. Most likely d/t hair loss. Pt denies HA pain or sensation below scalp. Told to call MD if symptoms become worse or HA-like pain. MD made aware and pt verbalized understanding to notify of changes.

## 2016-09-25 ENCOUNTER — Telehealth: Payer: Self-pay | Admitting: *Deleted

## 2016-09-25 ENCOUNTER — Telehealth: Payer: Self-pay | Admitting: Medical Oncology

## 2016-09-25 DIAGNOSIS — C155 Malignant neoplasm of lower third of esophagus: Secondary | ICD-10-CM

## 2016-09-25 NOTE — Telephone Encounter (Signed)
Coughing up green phlegm x 3 days . Thinks he has infection with temp 99.1 and not sleeping well. He is asking for antibiotic vs appt.

## 2016-09-25 NOTE — Telephone Encounter (Signed)
Call placed to patient to assess his status.  Patient states that his sputum is thick and is "plugging trach" at times.  He also states that he is having pain with urination.  Dr. Benay Spice notified and order received for patient to come in tomorrow at [facility] for lab appt to check urine and 0915 to see Dr. Benay Spice.  Call placed back to patient to inform him of times of appointments and to use OTC Mucinex or Guaifenesin per Dr. Benay Spice until he can be seen tomorrow.  Patient verbalizes an understanding of MD orders and is appreciative of call back.

## 2016-09-26 ENCOUNTER — Other Ambulatory Visit (HOSPITAL_BASED_OUTPATIENT_CLINIC_OR_DEPARTMENT_OTHER): Payer: BLUE CROSS/BLUE SHIELD

## 2016-09-26 ENCOUNTER — Telehealth: Payer: Self-pay | Admitting: *Deleted

## 2016-09-26 ENCOUNTER — Other Ambulatory Visit: Payer: Self-pay | Admitting: *Deleted

## 2016-09-26 ENCOUNTER — Ambulatory Visit (HOSPITAL_BASED_OUTPATIENT_CLINIC_OR_DEPARTMENT_OTHER): Payer: BLUE CROSS/BLUE SHIELD | Admitting: Oncology

## 2016-09-26 VITALS — BP 119/79 | HR 79 | Temp 98.6°F | Resp 18 | Ht 73.0 in | Wt 182.8 lb

## 2016-09-26 DIAGNOSIS — R300 Dysuria: Secondary | ICD-10-CM

## 2016-09-26 DIAGNOSIS — C155 Malignant neoplasm of lower third of esophagus: Secondary | ICD-10-CM

## 2016-09-26 DIAGNOSIS — R05 Cough: Secondary | ICD-10-CM | POA: Diagnosis not present

## 2016-09-26 DIAGNOSIS — R509 Fever, unspecified: Secondary | ICD-10-CM

## 2016-09-26 LAB — URINALYSIS, MICROSCOPIC - CHCC
Bilirubin (Urine): NEGATIVE
Glucose: NEGATIVE mg/dL
Ketones: NEGATIVE mg/dL
Leukocyte Esterase: NEGATIVE
NITRITE: NEGATIVE
Protein: NEGATIVE mg/dL
Specific Gravity, Urine: 1.015 (ref 1.003–1.035)
UROBILINOGEN UR: 0.2 mg/dL (ref 0.2–1)
pH: 6 (ref 4.6–8.0)

## 2016-09-26 MED ORDER — AZITHROMYCIN 250 MG PO TABS: ORAL_TABLET | ORAL | 0 refills | Status: DC

## 2016-09-26 MED FILL — AZITHROMYCIN 250 MG TABLET: 250 | 5 days supply | Qty: 6 | Fill #0

## 2016-09-26 NOTE — Telephone Encounter (Signed)
"  The doctor prescribed Azithromycin for me today.  I need to know if t here is an interaction between this and Mucinex."  Instructed there is no interaction between these two medicines.  No further questions.

## 2016-09-26 NOTE — Progress Notes (Signed)
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 He complains of a productive cough for the past 4 days. He has noted a low-grade fever. No high fever or significant dyspnea.  He reports intermittent burning with urination over the past week. No hematuria or frequency. He does not have dysuria today.   Objective:  Vital signs in last 24 hours:  Blood pressure 119/79, pulse 79, temperature 98.6 F (37 C), temperature source Oral, resp. rate 18, height _0  (1.854 m), weight 182 lb 12.8 oz (82.9 kg), SpO2 99 %.    HEENT:  no thrush, pharynx without erythema or exudate Resp:  bilateral expiratory rhonchi, inspiratory rhonchi at the left posterior base, no respiratory distress Cardio:  regular rate and rhythm GI:  no hepatomegaly, nontender Vascular:  no leg edema  Portacath/PICC-without erythema  Lab Results:  Lab Results  Component Value Date   WBC 6.8 09/21/2016   HGB 10.7 (L) 09/21/2016   HCT 34.0 (L) 09/21/2016   MCV 90.9 09/21/2016   PLT 190 09/21/2016   NEUTROABS 5.5 09/21/2016  Urinalysis: Trace bacteria, trace blood, 3-6 red cells, nitrite negative, leukocyte esterase negative, 0-2 white cells   Medications: I have reviewed the patient's current medications.  Assessment/Plan: 1.Adenocarcinoma of the distal esophagus/gastric cardia-status post an endoscopic biopsy on 03/18/2013 confirming adenocarcinoma, HER-2/neunot amplified  -Staging CT scans 03/20/2013 confirmed a distal esophageal/gastric cardia mass, distal paraesophageal lymph nodes and paratracheal nodes concerning for metastatic lymphadenopathy  -A PET scan 03/27/2013 with a multifocal area of hypermetabolic activity involving the thoracic esophagus extending into the gastric cardia and hypermetabolic mediastinal nodes, no distant metastatic disease   -Initiation of radiation 04/07/2013 and weekly Taxol/carboplatin 04/08/2013, radiation completed 05/15/2013  -Restaging PET scan 06/09/2013-no evidence of metastatic disease, no hypermetabolic lymphadenopathy  -Esophagogastrectomy 07/07/2013 revealed a ypT1b,ypN0 tumor with negative surgical margins  -CT neck 06/09/2014 revealed a necrotic lymph node in the right tracheoesophageal groove  -PET scan 11/15/2014 revealed a single focus of hypermetabolism corresponding to the necrotic right upper mediastinum lymph node -EUS biopsy of the paraesophageal lymph node 06/25/2012 confirmed metastatic adenocarcinoma  -Radiation completed 08/11/2014 -Rising CEA May 2016 -PET scan 07/13/2015 with hypermetabolic right paratracheal and right hilar adenopathy -Cycle 1 FOLFOX 08/03/2015 -Cycle 2 FOLFOX 08/17/2015 - cycle 3 FOLFOX 08/31/2015 (oxaliplatin dose reduced secondary to thrombocytopenia)  -Cycle 4 FOLFOX 09/14/2015 -Cycle 5 FOLFOX 09/28/2015 - restaging CTs 09/30/2015 with stable disease involving neck/chest lymph nodes, a slight increase in size of left upper lobe lung nodule  - cycle 6 FOLFOX 10/12/2015  -FOLFOX discontinued secondary to development of bilateral vocal cord paralysis November 2016 - CT scan of the neck and chest on 11/22/15 revealed no definite disease in the neck. At the thoracic inlet region, there is questionably slight increase in prominence of soft tissue at the right tracheoesophageal groove. This could be an enlarging node or mass but could also relate to the esophagectomy and pull-through surgery.Enlarging lingular and left lower lobe nodules are worrisome for metastatic disease. Peribronchovascular nodular consolidation in the anterior right lower lobe, slightly more prominent, indeterminate. Cholelithiasis. Left diaphragmatic hernia, stable. -Xeloda 1 week on/1 week off beginning 12/11/2015 -Restaging CTs of the neck and chest 02/07/2016 revealed enlargement of 2  left-sided lung nodules, no other evidence of disease progression -Cycle 1 Pembrolizumab 02/25/2016  -Cycle 2 Pembrolizumab 03/17/2016 -Cycle 3 Pembrolizumab 04/07/2016   -  CT 04/20/2016 consistent with progressive disease involving a large pericardial effusion, bilateral pleural effusions, enlarged mediastinal lymph nodes and lung nodules  Cytology from the pericardial fluid 04/21/2016-positive for metastatic adenocarcinoma -CT 05/01/2016 was slight worsening of right hilar lymphadenopathy with near complete collapse of the right middle lobe, nodular metastases in the left lung, stable bilateral pleural effusions  Cycle 1 Taxol/ramucirumab 05/12/2016; day 8 Taxol 05/19/2016; day 15 Taxol/ramucirumab 05/26/2016.  Cycle 2 day 1 Taxol/ramucirumab 06/22/2016  Cycle 2 day 8 Taxol 06/29/2016   Cycle 2 day 15 Taxol/ramucirumab07/28/2017  Cycle 3 day 1 Taxol/ramucirumab 07/20/2016  Cycle 3 day 8 Taxol 07/27/2016  Cycle 3 day 15 Taxol/ramucirumab08/24/2017  Restaging chest CT 08/10/2016-left upper lobe pulmonary nodule similar; left lower lobe pulmonary nodule may have enlarged minimally; improved appearance of the mediastinum and right infrahilar region with decreased soft tissue fullness; improvement in probable left infrahilar adenopathy; decreased left and resolved right pleural effusions; developing right adrenal nodularity; T7 vertebral body lesion suspicious for osseous metastasis.  Cycle 4 Taxol/ramucirumab 08/17/2016    cycle 5 Taxol/ramucirumab 09/13/2016  2. Indeterminate 15 mm right liver lesion on the CT scan 03/20/2013, MRI of the liver 06/09/2013 confirmed a right liver hemangioma  3. Anorexia/weight loss and solid dysphagia secondary to #1 -improved 4. History of a colon polyp, status post a polypectomy October 2013  5. history of Thrombocytopenia secondary chemotherapy-the carboplatin wase dose reduced with week 5 of chemotherapy chemotherapy  6. hoarseness  secondary to right vocal cord paralysis-CT/PET scan imaging consistent with a malignant right paratracheal lymph node causing the vocal cord paralysis  -Status post a laryngeal plasty at [facility] on 09/02/2014 7. Esophageal stricture-status post dilation 06/08/2015 -Food impaction removal 09/29/2015 -Esophageal stricture dilation 10/06/2015 8. Port-A-Cath placement 07/20/2015 9. Delayed nausea following cycle 1 FOLFOX. Aloxi and Emend added with cycle 2. 10. Thrombocytopenia secondary to chemotherapy  11. Emotional lability/depression-prophylactic Decadron discontinued; Remeron initiated 12. Progressive hoarseness November 2016, diagnosed with bilateral vocal cord paralysis, status post a tracheostomy and bilateral injection laryngoplasty at [facility] on 10/27/2015 13. Pericardial window 04/21/2016  14. 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 I suspect he has bronchitis. I have a low clinical suspicion for pneumonia. He will complete a course of azithromycin. He will contact us if his symptoms are not improved. He will return as scheduled for Taxol on 09/28/2016. We will follow-up on the urine culture from today. He will call for persistent dysuria.  He will return for an office visit as scheduled on 10/12/2016.   09/26/2016  9:39 AM

## 2016-09-28 ENCOUNTER — Ambulatory Visit (HOSPITAL_BASED_OUTPATIENT_CLINIC_OR_DEPARTMENT_OTHER): Payer: BLUE CROSS/BLUE SHIELD

## 2016-09-28 ENCOUNTER — Ambulatory Visit: Payer: BLUE CROSS/BLUE SHIELD

## 2016-09-28 ENCOUNTER — Other Ambulatory Visit: Payer: Self-pay | Admitting: Oncology

## 2016-09-28 ENCOUNTER — Other Ambulatory Visit (HOSPITAL_BASED_OUTPATIENT_CLINIC_OR_DEPARTMENT_OTHER): Payer: BLUE CROSS/BLUE SHIELD

## 2016-09-28 VITALS — BP 117/73 | HR 97 | Temp 98.5°F | Resp 18

## 2016-09-28 DIAGNOSIS — Z5112 Encounter for antineoplastic immunotherapy: Secondary | ICD-10-CM

## 2016-09-28 DIAGNOSIS — Z5111 Encounter for antineoplastic chemotherapy: Secondary | ICD-10-CM | POA: Diagnosis not present

## 2016-09-28 DIAGNOSIS — C155 Malignant neoplasm of lower third of esophagus: Secondary | ICD-10-CM

## 2016-09-28 DIAGNOSIS — Z95828 Presence of other vascular implants and grafts: Secondary | ICD-10-CM

## 2016-09-28 DIAGNOSIS — Z23 Encounter for immunization: Secondary | ICD-10-CM

## 2016-09-28 LAB — CBC WITH DIFFERENTIAL/PLATELET
BASO%: 2.7 % — AB (ref 0.0–2.0)
Basophils Absolute: 0.1 10*3/uL (ref 0.0–0.1)
EOS%: 3.4 % (ref 0.0–7.0)
Eosinophils Absolute: 0.1 10*3/uL (ref 0.0–0.5)
HCT: 33.3 % — ABNORMAL LOW (ref 38.4–49.9)
HGB: 10.6 g/dL — ABNORMAL LOW (ref 13.0–17.1)
LYMPH%: 22.9 % (ref 14.0–49.0)
MCH: 29 pg (ref 27.2–33.4)
MCHC: 31.8 g/dL — AB (ref 32.0–36.0)
MCV: 91 fL (ref 79.3–98.0)
MONO#: 0.3 10*3/uL (ref 0.1–0.9)
MONO%: 10.6 % (ref 0.0–14.0)
NEUT#: 1.8 10*3/uL (ref 1.5–6.5)
NEUT%: 60.4 % (ref 39.0–75.0)
Platelets: 182 10*3/uL (ref 140–400)
RBC: 3.66 10*6/uL — AB (ref 4.20–5.82)
RDW: 17.1 % — ABNORMAL HIGH (ref 11.0–14.6)
WBC: 2.9 10*3/uL — AB (ref 4.0–10.3)
lymph#: 0.7 10*3/uL — ABNORMAL LOW (ref 0.9–3.3)

## 2016-09-28 LAB — URINE CULTURE: ORGANISM ID, BACTERIA: NO GROWTH

## 2016-09-28 LAB — CEA (IN HOUSE-CHCC): CEA (CHCC-IN HOUSE): 51.21 ng/mL — AB (ref 0.00–5.00)

## 2016-09-28 MED ORDER — ACETAMINOPHEN 160 MG/5ML PO SOLN
650.0000 mg | Freq: Once | ORAL | Status: AC
  Administered 2016-09-28: 650 mg via ORAL
  Filled 2016-09-28: qty 20.3

## 2016-09-28 MED ORDER — SODIUM CHLORIDE 0.9 % IV SOLN
80.0000 mg/m2 | Freq: Once | INTRAVENOUS | Status: AC
  Administered 2016-09-28: 162 mg via INTRAVENOUS
  Filled 2016-09-28: qty 27

## 2016-09-28 MED ORDER — DEXAMETHASONE SODIUM PHOSPHATE 10 MG/ML IJ SOLN
INTRAMUSCULAR | Status: AC
  Filled 2016-09-28: qty 1

## 2016-09-28 MED ORDER — RAMUCIRUMAB CHEMO INJECTION 500 MG/50ML
8.0000 mg/kg | Freq: Once | INTRAVENOUS | Status: AC
  Administered 2016-09-28: 600 mg via INTRAVENOUS
  Filled 2016-09-28: qty 50

## 2016-09-28 MED ORDER — DEXAMETHASONE SODIUM PHOSPHATE 10 MG/ML IJ SOLN
10.0000 mg | Freq: Once | INTRAMUSCULAR | Status: AC
  Administered 2016-09-28: 10 mg via INTRAVENOUS

## 2016-09-28 MED ORDER — SODIUM CHLORIDE 0.9 % IV SOLN
Freq: Once | INTRAVENOUS | Status: AC
  Administered 2016-09-28: 13:00:00 via INTRAVENOUS

## 2016-09-28 MED ORDER — HEPARIN SOD (PORK) LOCK FLUSH 100 UNIT/ML IV SOLN
500.0000 [IU] | Freq: Once | INTRAVENOUS | Status: AC | PRN
  Administered 2016-09-28: 500 [IU]
  Filled 2016-09-28: qty 5

## 2016-09-28 MED ORDER — SODIUM CHLORIDE 0.9 % IV SOLN: 10.0000 mg | Freq: Once | INTRAVENOUS | Status: DC

## 2016-09-28 MED ORDER — SODIUM CHLORIDE 0.9 % IJ SOLN
10.0000 mL | INTRAMUSCULAR | Status: DC | PRN
  Administered 2016-09-28: 10 mL via INTRAVENOUS
  Filled 2016-09-28: qty 10

## 2016-09-28 MED ORDER — FAMOTIDINE IN NACL 20-0.9 MG/50ML-% IV SOLN
20.0000 mg | Freq: Once | INTRAVENOUS | Status: AC
  Administered 2016-09-28: 20 mg via INTRAVENOUS

## 2016-09-28 MED ORDER — FAMOTIDINE IN NACL 20-0.9 MG/50ML-% IV SOLN
INTRAVENOUS | Status: AC
  Filled 2016-09-28: qty 50

## 2016-09-28 MED ORDER — DIPHENHYDRAMINE HCL 50 MG/ML IJ SOLN
INTRAMUSCULAR | Status: AC
  Filled 2016-09-28: qty 1

## 2016-09-28 MED ORDER — SODIUM CHLORIDE 0.9% FLUSH
10.0000 mL | INTRAVENOUS | Status: DC | PRN
  Administered 2016-09-28: 10 mL
  Filled 2016-09-28: qty 10

## 2016-09-28 MED ORDER — DIPHENHYDRAMINE HCL 50 MG/ML IJ SOLN
25.0000 mg | Freq: Once | INTRAMUSCULAR | Status: AC
  Administered 2016-09-28: 25 mg via INTRAVENOUS

## 2016-09-28 MED ORDER — ACETAMINOPHEN 325 MG PO TABS
ORAL_TABLET | ORAL | Status: AC
  Filled 2016-09-28: qty 2

## 2016-09-28 NOTE — Patient Instructions (Signed)
[place] Discharge Instructions for Patients Receiving Chemotherapy  Today you received the following chemotherapy agents taxol/cyramza  To help prevent nausea and vomiting after your treatment, we encourage you to take your nausea medication as directed.   If you develop nausea and vomiting that is not controlled by your nausea medication, call the clinic.   BELOW ARE SYMPTOMS THAT SHOULD BE REPORTED IMMEDIATELY:  *FEVER GREATER THAN 100.5 F  *CHILLS WITH OR WITHOUT FEVER  NAUSEA AND VOMITING THAT IS NOT CONTROLLED WITH YOUR NAUSEA MEDICATION  *UNUSUAL SHORTNESS OF BREATH  *UNUSUAL BRUISING OR BLEEDING  TENDERNESS IN MOUTH AND THROAT WITH OR WITHOUT PRESENCE OF ULCERS  *URINARY PROBLEMS  *BOWEL PROBLEMS  UNUSUAL RASH Items with * indicate a potential emergency and should be followed up as soon as possible.  Feel free to call the clinic you have any questions or concerns. The clinic phone number is [phone].

## 2016-09-28 NOTE — Patient Instructions (Signed)

## 2016-09-28 NOTE — Progress Notes (Signed)
Ok per Chalmers Cater, RN via Dr. Benay Spice to treat using CMET from 10/12.

## 2016-10-04 ENCOUNTER — Telehealth: Payer: Self-pay | Admitting: *Deleted

## 2016-10-04 ENCOUNTER — Other Ambulatory Visit: Payer: Self-pay | Admitting: *Deleted

## 2016-10-04 ENCOUNTER — Ambulatory Visit (HOSPITAL_BASED_OUTPATIENT_CLINIC_OR_DEPARTMENT_OTHER): Payer: BLUE CROSS/BLUE SHIELD | Admitting: Nurse Practitioner

## 2016-10-04 ENCOUNTER — Ambulatory Visit (HOSPITAL_COMMUNITY)
Admission: RE | Admit: 2016-10-04 | Discharge: 2016-10-04 | Disposition: A | Discharge status: Home / Self Care | Payer: BLUE CROSS/BLUE SHIELD | Source: Ambulatory Visit | Attending: Nurse Practitioner | Admitting: Nurse Practitioner

## 2016-10-04 ENCOUNTER — Other Ambulatory Visit (HOSPITAL_BASED_OUTPATIENT_CLINIC_OR_DEPARTMENT_OTHER): Payer: BLUE CROSS/BLUE SHIELD

## 2016-10-04 VITALS — BP 142/86 | HR 103 | Temp 98.8°F | Resp 17 | Ht 73.0 in | Wt 185.6 lb

## 2016-10-04 DIAGNOSIS — C153 Malignant neoplasm of upper third of esophagus: Secondary | ICD-10-CM | POA: Diagnosis not present

## 2016-10-04 DIAGNOSIS — C155 Malignant neoplasm of lower third of esophagus: Secondary | ICD-10-CM

## 2016-10-04 DIAGNOSIS — M25512 Pain in left shoulder: Secondary | ICD-10-CM

## 2016-10-04 LAB — CBC WITH DIFFERENTIAL/PLATELET
BASO%: 2.5 % — AB (ref 0.0–2.0)
BASOS ABS: 0.1 10*3/uL (ref 0.0–0.1)
EOS%: 1.8 % (ref 0.0–7.0)
Eosinophils Absolute: 0.1 10*3/uL (ref 0.0–0.5)
HEMATOCRIT: 36.1 % — AB (ref 38.4–49.9)
HGB: 11.6 g/dL — ABNORMAL LOW (ref 13.0–17.1)
LYMPH#: 0.6 10*3/uL — AB (ref 0.9–3.3)
LYMPH%: 17.8 % (ref 14.0–49.0)
MCH: 28.9 pg (ref 27.2–33.4)
MCHC: 32.2 g/dL (ref 32.0–36.0)
MCV: 89.9 fL (ref 79.3–98.0)
MONO#: 0.2 10*3/uL (ref 0.1–0.9)
MONO%: 6.5 % (ref 0.0–14.0)
NEUT#: 2.2 10*3/uL (ref 1.5–6.5)
NEUT%: 71.4 % (ref 39.0–75.0)
Platelets: 203 10*3/uL (ref 140–400)
RBC: 4.01 10*6/uL — AB (ref 4.20–5.82)
RDW: 19.5 % — ABNORMAL HIGH (ref 11.0–14.6)
WBC: 3.1 10*3/uL — ABNORMAL LOW (ref 4.0–10.3)

## 2016-10-04 LAB — URINALYSIS, MICROSCOPIC - CHCC
BILIRUBIN (URINE): NEGATIVE
GLUCOSE UR CHCC: NEGATIVE mg/dL
KETONES: NEGATIVE mg/dL
LEUKOCYTE ESTERASE: NEGATIVE
Nitrite: NEGATIVE
PH: 6 (ref 4.6–8.0)
Protein: <30 mg/dL
Specific Gravity, Urine: 1.015 (ref 1.003–1.035)
Urobilinogen, UR: 0.2 mg/dL (ref 0.2–1)

## 2016-10-04 LAB — COMPREHENSIVE METABOLIC PANEL
ALT: 13 U/L (ref 0–55)
ANION GAP: 9 meq/L (ref 3–11)
AST: 14 U/L (ref 5–34)
Albumin: 3.5 g/dL (ref 3.5–5.0)
Alkaline Phosphatase: 87 U/L (ref 40–150)
BUN: 7.5 mg/dL (ref 7.0–26.0)
CALCIUM: 9.1 mg/dL (ref 8.4–10.4)
CHLORIDE: 106 meq/L (ref 98–109)
CO2: 26 meq/L (ref 22–29)
Creatinine: 0.7 mg/dL (ref 0.7–1.3)
EGFR: >90 mL/min/{1.73_m2} (ref >90)
Glucose: 67 mg/dl — ABNORMAL LOW (ref 70–140)
POTASSIUM: 4 meq/L (ref 3.5–5.1)
Sodium: 140 mEq/L (ref 136–145)
Total Bilirubin: 0.51 mg/dL (ref 0.20–1.20)
Total Protein: 7.4 g/dL (ref 6.4–8.3)

## 2016-10-04 LAB — MAGNESIUM: MAGNESIUM: 2.2 mg/dL (ref 1.5–2.5)

## 2016-10-04 MED ORDER — CYCLOBENZAPRINE HCL 10 MG PO TABS: 10.0000 mg | ORAL_TABLET | Freq: Three times a day (TID) | ORAL | 0 refills | Status: DC | PRN

## 2016-10-04 MED ORDER — ZOLPIDEM TARTRATE 5 MG PO TABS: 5.0000 mg | ORAL_TABLET | Freq: Every evening | ORAL | 0 refills | Status: DC | PRN

## 2016-10-04 MED FILL — ZOLPIDEM TARTRATE 5 MG TAB: 5 | 30 days supply | Qty: 30 | Fill #0

## 2016-10-04 MED FILL — CYCLOBENZAPRINE 10 MG TAB: 10 | 10 days supply | Qty: 30 | Fill #0

## 2016-10-04 NOTE — Telephone Encounter (Signed)
Spoke with pt and was informed that pt has had  Severe back pain for about 3 days.  Stated feeling dizzy with movement.  Had left over pain med - Oxycodone 5 ml every 3 - 4 hours - with  not much pain relief.  Stated bowel functions fine.  Had problem with voiding but ok now. Lavella Lemons, RN desk nurse consulted.   Spoke with pt and gave pt appts for lab 1230 pm , and symptom management visit at 130 pm as ok per Jenny Reichmann, NP. Pt voiced understanding. Pt's   Cell  Phone     [phone].

## 2016-10-05 ENCOUNTER — Telehealth: Payer: Self-pay | Admitting: *Deleted

## 2016-10-05 ENCOUNTER — Ambulatory Visit (HOSPITAL_COMMUNITY)
Admission: RE | Admit: 2016-10-05 | Discharge: 2016-10-05 | Disposition: A | Discharge status: Home / Self Care | Payer: BLUE CROSS/BLUE SHIELD | Source: Ambulatory Visit | Attending: Nurse Practitioner | Admitting: Nurse Practitioner

## 2016-10-05 DIAGNOSIS — R918 Other nonspecific abnormal finding of lung field: Secondary | ICD-10-CM | POA: Insufficient documentation

## 2016-10-05 DIAGNOSIS — C153 Malignant neoplasm of upper third of esophagus: Secondary | ICD-10-CM | POA: Diagnosis present

## 2016-10-05 DIAGNOSIS — I700 Atherosclerosis of aorta: Secondary | ICD-10-CM | POA: Insufficient documentation

## 2016-10-05 DIAGNOSIS — R591 Generalized enlarged lymph nodes: Secondary | ICD-10-CM | POA: Insufficient documentation

## 2016-10-05 MED ORDER — IOPAMIDOL (ISOVUE-300) INJECTION 61%
75.0000 mL | Freq: Once | INTRAVENOUS | Status: AC | PRN
  Administered 2016-10-05: 75 mL via INTRAVENOUS

## 2016-10-05 NOTE — Telephone Encounter (Signed)
Pt called wanting to know results of CT scan done today.  Message routed to [place], NP. Pt's    Phone   [phone].

## 2016-10-06 ENCOUNTER — Encounter: Payer: Self-pay | Admitting: Nurse Practitioner

## 2016-10-06 ENCOUNTER — Telehealth: Payer: Self-pay | Admitting: Nurse Practitioner

## 2016-10-06 DIAGNOSIS — M25512 Pain in left shoulder: Secondary | ICD-10-CM | POA: Insufficient documentation

## 2016-10-06 NOTE — Assessment & Plan Note (Signed)
Patient received cycle 5, day 15 of his Taxol/Cyramza chemotherapy regimen on 09/28/2016.  He is scheduled to return on 10/12/2016 for labs, flush, visit, and his next cycle of chemotherapy.

## 2016-10-06 NOTE — Telephone Encounter (Signed)
Called patient to review his CT chest results.  CT chest revealed:  IMPRESSION: 1. Mediastinal and bilateral hilar lymphadenopathy is mildly increased. 2. Interlobular septal thickening asymmetrically involving the entire right lung is increased, worrisome for lymphangitic tumor. 3. Two left lung nodules are mildly increased, most consistent with enlarging metastases. 4. Stable T7 vertebral body lytic lesion. 5. Additional findings include aortic atherosclerosis, cholelithiasis and scarring versus atelectasis at the left lung Base.  Reviewed findings with Ned Card, nurse practitioner prior to call in the patient.  Then, advised patient that there could quite possibly be some mild progression.  Advised patient would review all with Dr. Benay Spice when he returns next week.  Patient is scheduled to meet with Dr. Benay Spice on 10/12/2016.  Patient was comfortable with this plan of care.  Patient stated that he continues with pain to his left shoulder region; despite taking the oxycodone he already has at home. He stated that the Flexeril.  He was prescribed yesterday is not helping.    He was advised to call/return or go directly to the emergency department for any worsening symptoms whatsoever.

## 2016-10-06 NOTE — Assessment & Plan Note (Signed)
Patient states that he worked out in the yard this past Sunday, 10/01/2016; and then noticed some acute left shoulder blade and shoulder pain within 24 hours.  He denies any other injury or trauma to his left shoulder area.  He denies any chest pain, chest pressure, shortness breath, or pain with inspiration.  He denies any recent fevers or chills.  He continues with his trach as baseline.  Patient states he has tried oxycodone at home that he already had as a prescription.  Exam today reveals no obvious injury or trauma to the left chest, back, or shoulder area.  Patient was observed with discomfort when lifting his left arm up.  There was no evidence of infection noted on exam.  Patient underwent plain film x-rays of his left scapula and left shoulder with no acute findings.  Patient will proceed with a CT of the chest for further evaluation.  Tomorrow morning.  Note: Could not obtain a MRI due to patient's mental trach device.  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  In the meantime-patient was advised to call/return or go directly to the emergency room for any worsening symptoms whatsoever.  All details of today's visit were reviewed with Dr. Burr Medico.

## 2016-10-06 NOTE — Progress Notes (Signed)
SYMPTOM MANAGEMENT CLINIC    Chief Complaint: Left shoulder pain  HPI:  Joel Osborne, Dr. 50 y.o. male diagnosed with esophageal cancer.  Currently undergoing Taxol/Cyramza chemotherapy regimen.     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        Review of Systems  Musculoskeletal:       Left shoulder pain  All other systems reviewed and are negative.   Past Medical History:  Diagnosis Date  . Anxiety   . Colon polyp   . Diverticulosis   . Esophageal cancer ([place]) 03/18/2013   Adenocarcinoma  . GERD (gastroesophageal reflux disease)   . History of radiation therapy 04/07/13-05-15-13   Esopageal ca,50.4Gy/16f and [facility] [place] [facility]  . Hyperlipemia   . Pre-diabetes   . Shortness of breath dyspnea   . Sleep apnea    no c-pap at this time    Past Surgical History:  Procedure Laterality Date  . APPENDECTOMY    . BALLOON DILATION N/A 06/05/2016   Procedure: BALLOON DILATION;  Surgeon: JIrene Shipper MD;  [facility];  Service: Endoscopy;  Laterality: N/A;  . COLONOSCOPY    . COMPLETE ESOPHAGECTOMY N/A 07/07/2013   Procedure: ESOPHAGECTOMY COMPLETE;  Surgeon: EGrace Isaac MD;  [place]  Service: Thoracic;  Laterality: N/A;  transhiatel esophagectomy, feeding jejunostomy  . EDG  03/18/2013   Esophageal Mass  . ESOPHAGOGASTRODUODENOSCOPY N/A 07/02/2014   Procedure: ESOPHAGOGASTRODUODENOSCOPY (EGD);  Surgeon: CGatha Mayer MD;  [facility];  Service: Endoscopy;  Laterality: N/A;  . ESOPHAGOGASTRODUODENOSCOPY N/A 02/16/2015   Procedure: ESOPHAGOGASTRODUODENOSCOPY (EGD);  Surgeon: DLafayette Dragon MD;  [facility];  Service: Endoscopy;  Laterality: N/A;  . ESOPHAGOGASTRODUODENOSCOPY N/A 09/29/2015   Procedure: ESOPHAGOGASTRODUODENOSCOPY (EGD);  Surgeon: MLadene Artist MD;  [facility];  Service: Endoscopy;  Laterality: N/A;  . ESOPHAGOGASTRODUODENOSCOPY N/A 06/05/2016   Procedure: ESOPHAGOGASTRODUODENOSCOPY (EGD);  Surgeon: JIrene Shipper MD;  [facility];  Service: Endoscopy;  Laterality: N/A;  . EUS N/A 06/25/2014   Procedure: UPPER ENDOSCOPIC ULTRASOUND (EUS) LINEAR;  Surgeon: DMilus Banister MD;  [facility];  Service: Endoscopy;  Laterality: N/A;  . LIPOMA EXCISION    . SUBXYPHOID PERICARDIAL WINDOW N/A 04/21/2016   Procedure: SUBXYPHOID PERICARDIAL WINDOW;  Surgeon: SMelrose Nakayama MD;  [place]  Service: Thoracic;  Laterality: N/A;  . UPPER GASTROINTESTINAL ENDOSCOPY    . VIDEO BRONCHOSCOPY N/A 07/07/2013   Procedure: VIDEO BRONCHOSCOPY;  Surgeon: EGrace Isaac MD;  [facility];  Service: Thoracic;  Laterality: N/A;  . vocal chord surgery     pushed right vocal cord more centered- September 2015    has Cancer of distal third of [place]ophagus [place]; HCAP (healthcare-associated pneumonia); Acute respiratory failure with hypoxia ([place]; Pericardial effusion with cardiac tamponade; Leukocytosis; Anemia of chronic disease; Hilar adenopathy; Lymphatic obstruction; Elevated liver enzymes; Encounter for chest tube removal; Atrial fibrillation, [place] onset ([place]; Knee pain; Pain; Chest pain; Dyspnea; Paroxysmal atrial fibrillation ([place]; Mass in chest; Malignant neoplasm of esophagus ([place]; Port catheter in place; Dysphagia; Esophageal stricture; and Shoulder pain, left on his problem list.    is allergic to lactose intolerance (gi).    Medication List       Accurate as of  10/04/16 11:59 PM. Always use your most recent med list.          acetaminophen 500 MG tablet Commonly known as:  TYLENOL Take 1,000 mg by mouth every 6 (six) hours as needed for mild pain or moderate pain.   ALPRAZolam 0.5 MG tablet Commonly known as:  XANAX TAKE  1 TABLET BY MOUTH 3 TIMES DAILY AS NEEDED FOR ANXIETY   benzonatate 100 MG capsule Commonly known as:  TESSALON Take 1 capsule (100 mg total) by mouth 3 (three) times daily as needed for cough.   cyclobenzaprine 10 MG tablet Commonly known as:  FLEXERIL Take 1 tablet (10 mg total) by mouth 3 (three) times daily as needed for muscle spasms.   ibuprofen 200 MG tablet Commonly known as:  ADVIL,MOTRIN Take 200 mg by mouth every 8 (eight) hours as needed. Reported on 05/12/2016   lactase 3000 units tablet Commonly known as:  LACTAID Take 3,000 Units by mouth daily as needed (lactose intolerance). Reported on 05/12/2016   lidocaine-prilocaine cream Commonly known as:  EMLA Apply 1 application topically as needed. Apply tablespoon to PAC 2 hours prior to stick and cover with plastic wrap   mirtazapine 15 MG tablet Commonly known as:  REMERON Take 1 tablet (15 mg total) by mouth at bedtime.   multivitamin with minerals Tabs tablet Take 1 tablet by mouth daily.   vitamin C 1000 MG tablet Take 1,000 mg by mouth 3 (three) times daily.   zolpidem 5 MG tablet Commonly known as:  AMBIEN Take 1 tablet (5 mg total) by mouth at bedtime as needed for sleep.        PHYSICAL EXAMINATION  Oncology Vitals 10/04/2016 09/28/2016  Height 185 cm -  Weight 84.188 kg -  Weight (lbs) 185 lbs 10 oz -  BMI (kg/m2) 24.49 kg/m2 -  Temp 98.8 98.5  Pulse 103 97  Resp 17 18  SpO2 97 96  BSA (m2) 2.08 m2 -   BP Readings from Last 2 Encounters:  10/04/16 (!) 142/86  09/28/16 117/73    Physical Exam  Constitutional: He is oriented to person, place, and time. He appears unhealthy.  HENT:  Head: Normocephalic and atraumatic.  Trach intact  Eyes: Conjunctivae and EOM are normal. Pupils are equal, round, and reactive to light.  Neck: Normal range of motion. Neck supple.  Pulmonary/Chest: Effort normal. No respiratory distress.  Musculoskeletal: He exhibits tenderness. He exhibits no edema.    Tenderness with full range of motion with the left arm and shoulder.  Patient has pain when he tries to lift his shoulder above the level of his neck.  Neurological: He is alert and oriented to person, place, and time. Gait normal.  Skin: Skin is warm and dry.  Psychiatric: Affect normal.  Nursing note and vitals reviewed.   LABORATORY DATA:. Appointment on 10/04/2016  Component Date Value Ref Range Status  . WBC 10/04/2016 3.1* 4.0 - 10.3 10e3/uL Final  . NEUT# 10/04/2016 2.2  1.5 - 6.5 10e3/uL Final  . HGB 10/04/2016 11.6* 13.0 - 17.1 g/dL Final  . HCT 10/04/2016 36.1* 38.4 - 49.9 % Final  . Platelets 10/04/2016 203  140 - 400 10e3/uL Final  . MCV 10/04/2016 89.9  79.3 - 98.0 fL Final  . MCH 10/04/2016 28.9  27.2 - 33.4 pg Final  . MCHC 10/04/2016 32.2  32.0 - 36.0 g/dL Final  . RBC 10/04/2016 4.01* 4.20 - 5.82 10e6/uL Final  . RDW 10/04/2016 19.5* 11.0 - 14.6 %  Final  . lymph# 10/04/2016 0.6* 0.9 - 3.3 10e3/uL Final  . MONO# 10/04/2016 0.2  0.1 - 0.9 10e3/uL Final  . Eosinophils Absolute 10/04/2016 0.1  0.0 - 0.5 10e3/uL Final  . Basophils Absolute 10/04/2016 0.1  0.0 - 0.1 10e3/uL Final  . NEUT% 10/04/2016 71.4  39.0 - 75.0 % Final  . LYMPH% 10/04/2016 17.8  14.0 - 49.0 % Final  . MONO% 10/04/2016 6.5  0.0 - 14.0 % Final  . EOS% 10/04/2016 1.8  0.0 - 7.0 % Final  . BASO% 10/04/2016 2.5* 0.0 - 2.0 % Final  . Sodium 10/04/2016 140  136 - 145 mEq/L Final  . Potassium 10/04/2016 4.0  3.5 - 5.1 mEq/L Final  . Chloride 10/04/2016 106  98 - 109 mEq/L Final  . CO2 10/04/2016 26  22 - 29 mEq/L Final  . Glucose 10/04/2016 67* 70 - 140 mg/dl Final  . BUN 10/04/2016 7.5  7.0 - 26.0 mg/dL Final  . Creatinine 10/04/2016 0.7  0.7 - 1.3 mg/dL Final  . Total Bilirubin 10/04/2016 0.51  0.20 - 1.20 mg/dL Final  . Alkaline Phosphatase 10/04/2016 87  40 - 150 U/L Final  . AST 10/04/2016 14  5 - 34 U/L Final  . ALT 10/04/2016 13  0 - 55 U/L Final  . Total Protein 10/04/2016 7.4  6.4 - 8.3  g/dL Final  . Albumin 10/04/2016 3.5  3.5 - 5.0 g/dL Final  . Calcium 10/04/2016 9.1  8.4 - 10.4 mg/dL Final  . Anion Gap 10/04/2016 9  3 - 11 mEq/L Final  . EGFR 10/04/2016 >90  >90 ml/min/1.73 m2 Final  . Magnesium 10/04/2016 2.2  1.5 - 2.5 mg/dl Final  . Glucose 10/04/2016 Negative  Negative mg/dL Final  . Bilirubin (Urine) 10/04/2016 Negative  Negative Final  . Ketones 10/04/2016 Negative  Negative mg/dL Final  . Specific Gravity, Urine 10/04/2016 1.015  1.003 - 1.035 Final  . Blood 10/04/2016 Moderate  Negative Final  . pH 10/04/2016 6.0  4.6 - 8.0 Final  . Protein 10/04/2016 < 30  Negative- <30 mg/dL Final  . Urobilinogen, UR 10/04/2016 0.2  0.2 - 1 mg/dL Final  . Nitrite 10/04/2016 Negative  Negative Final  . Leukocyte Esterase 10/04/2016 Negative  Negative Final  . RBC / HPF 10/04/2016 0-2  0 - 2 Final  . WBC, UA 10/04/2016 0-2  0 - 2 Final  . Bacteria, UA 10/04/2016 Few  Negative- Trace Final  . Casts 10/04/2016 Hyaline  Negative Final  . Crystals 10/04/2016 Ca Oxalate  Negative Final  . Epithelial Cells 10/04/2016 Occasional  Negative- Few Final    RADIOGRAPHIC STUDIES: Dg Scapula Left  Result Date: 10/04/2016 CLINICAL DATA:  Esophageal malignancy, left shoulder and left scapula pain EXAM: LEFT SCAPULA - 1+ VIEWS COMPARISON:  None. FINDINGS: There is no evidence of fracture or other focal bone lesions. Soft tissues are unremarkable. Tracheostomy tube in place. IMPRESSION: Negative. Electronically Signed   By: Lahoma Crocker M.D.   On: 10/04/2016 15:15   Ct Chest W Contrast  Result Date: 10/05/2016 CLINICAL DATA:  Upper third esophageal cancer diagnosed in 2014 with a history of T7 spinal metastasis, now presenting with left scapula/ shoulder pain. EXAM: CT CHEST WITH CONTRAST TECHNIQUE: Multidetector CT imaging of the chest was performed during intravenous contrast administration. CONTRAST:  99m ISOVUE-300 IOPAMIDOL (ISOVUE-300) INJECTION 61% COMPARISON:  08/10/2016 chest CT.  FINDINGS: Cardiovascular: Normal heart size. Stable trace pericardial effusion/thickening. Right internal jugular MediPort terminates in the lower third of  the superior vena cava. Atherosclerotic nonaneurysmal thoracic aorta. Normal caliber pulmonary arteries. No central pulmonary emboli. Mediastinum/Nodes: No discrete thyroid nodules. Status post esophagectomy with gastric pull-through, with intact appearing anastomosis in the upper mediastinum. No pneumomediastinum. No mediastinal fluid collections. No axillary adenopathy. Mildly enlarged AP window 1.0 cm node (series 2/ image 67), previously 0.8 cm, mildly increased. Bilateral hilar lymphadenopathy is mildly increased bilaterally, for example a 1.2 cm right hilar node (series 2/ image 79) is increased from 1.1 cm and a left infrahilar 2.0 cm node (series 2/ image 72) is increased from 1.7 cm. Lungs/Pleura: No pneumothorax. No pleural effusion. Tracheostomy tube tip is well positioned in the upper tracheal lumen. Superior segment left lower lobe 5 mm solid pulmonary nodule (series 5/ image 40) is increased from 3 mm. Central left upper lobe 1.8 cm nodule (series 5/ image 72) is increased from 1.7 cm. There is extensive interlobular septal thickening asymmetrically involving the right lung, increased. There is bandlike consolidation and volume loss in the left lower lobe, increased, favor scarring or atelectasis. Upper abdomen: Cholelithiasis. Enlarging 2.9 x 1.9 cm right adrenal nodule (series 2/ image 141), previously 1.9 x 1.3 cm. Musculoskeletal: Stable lytic lesion in the posterior T7 vertebral body. No new focal osseous lesions. Mild thoracic spondylosis. IMPRESSION: 1. Mediastinal and bilateral hilar lymphadenopathy is mildly increased. 2. Interlobular septal thickening asymmetrically involving the entire right lung is increased, worrisome for lymphangitic tumor. 3. Two left lung nodules are mildly increased, most consistent with enlarging metastases. 4.  Stable T7 vertebral body lytic lesion. 5. Additional findings include aortic atherosclerosis, cholelithiasis and scarring versus atelectasis at the left lung base. Electronically Signed   By: Delbert Phenix M.D.   On: 10/05/2016 09:36   Dg Shoulder Left  Result Date: 10/04/2016 CLINICAL DATA:  Malignant neoplasm of upper esophagus. Left scapular and shoulder pain. EXAM: LEFT SHOULDER - 2+ VIEW COMPARISON:  None. FINDINGS: Tracheostomy tube noted in place. Mild joint space narrowing in the glenohumeral joint with early spurring. No acute bony abnormality. Specifically, no fracture, subluxation, or dislocation. Soft tissues are intact. IMPRESSION: Early degenerative changes in the left shoulder.  No acute findings. Electronically Signed   By: Charlett Nose M.D.   On: 10/04/2016 15:07    ASSESSMENT/PLAN:    Shoulder pain, left Patient states that he worked out in the yard this past Sunday, 10/01/2016; and then noticed some acute left shoulder blade and shoulder pain within 24 hours.  He denies any other injury or trauma to his left shoulder area.  He denies any chest pain, chest pressure, shortness breath, or pain with inspiration.  He denies any recent fevers or chills.  He continues with his trach as baseline.  Patient states he has tried oxycodone at home that he already had as a prescription.  Exam today reveals no obvious injury or trauma to the left chest, back, or shoulder area.  Patient was observed with discomfort when lifting his left arm up.  There was no evidence of infection noted on exam.  Patient underwent plain film x-rays of his left scapula and left shoulder with no acute findings.  Patient will proceed with a CT of the chest for further evaluation.  Tomorrow morning.  Note: Could not obtain a MRI due to patient's mental trach device.  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  In the meantime-patient was advised to call/return or go directly to the  emergency room for any worsening  symptoms whatsoever.  All details of today's visit were reviewed with Dr. Burr Medico.    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  He is scheduled to return on 10/12/2016 for labs, flush, visit, and his next cycle of chemotherapy.   Patient stated understanding of all instructions; and was in agreement with this plan of care. The patient knows to call the clinic with any problems, questions or concerns.   Total time spent with patient was 25 minutes;  with greater than 75 percent of that time spent in face to face counseling regarding patient's symptoms,  and coordination of care and follow up.  Disclaimer:This dictation was prepared with Dragon/digital dictation along with [employer]. Any transcriptional errors that result from this process are unintentional.  Drue Second, NP 10/06/2016

## 2016-10-08 IMAGING — CR DG CHEST 2V
2 series · 2 of 2 positions shown · non-contrast
Comparison: 05/09/2016.

CLINICAL DATA: Esophageal cancer.  Fever.

EXAM:
CHEST  2 VIEW

[w chest pa]
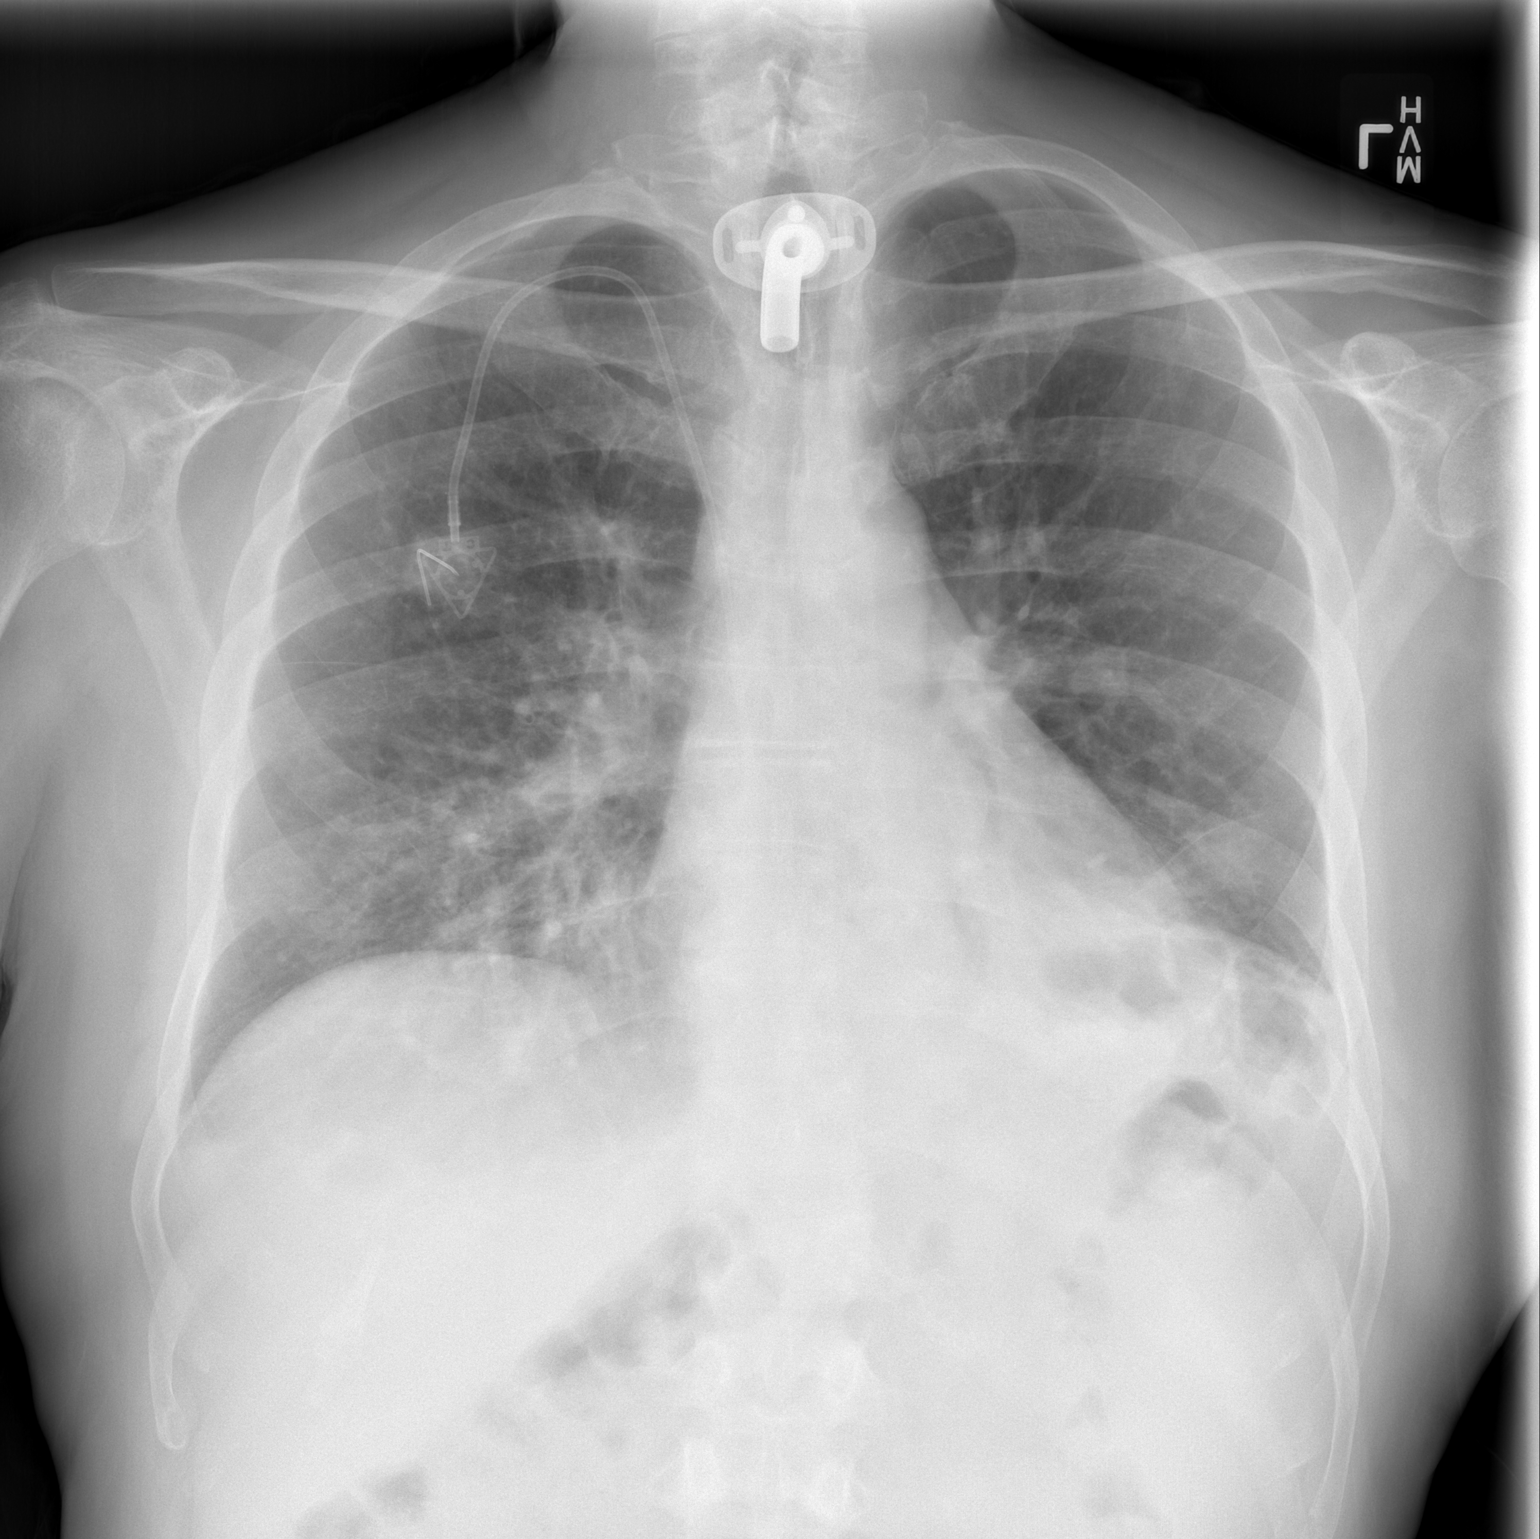

[w chest lat]
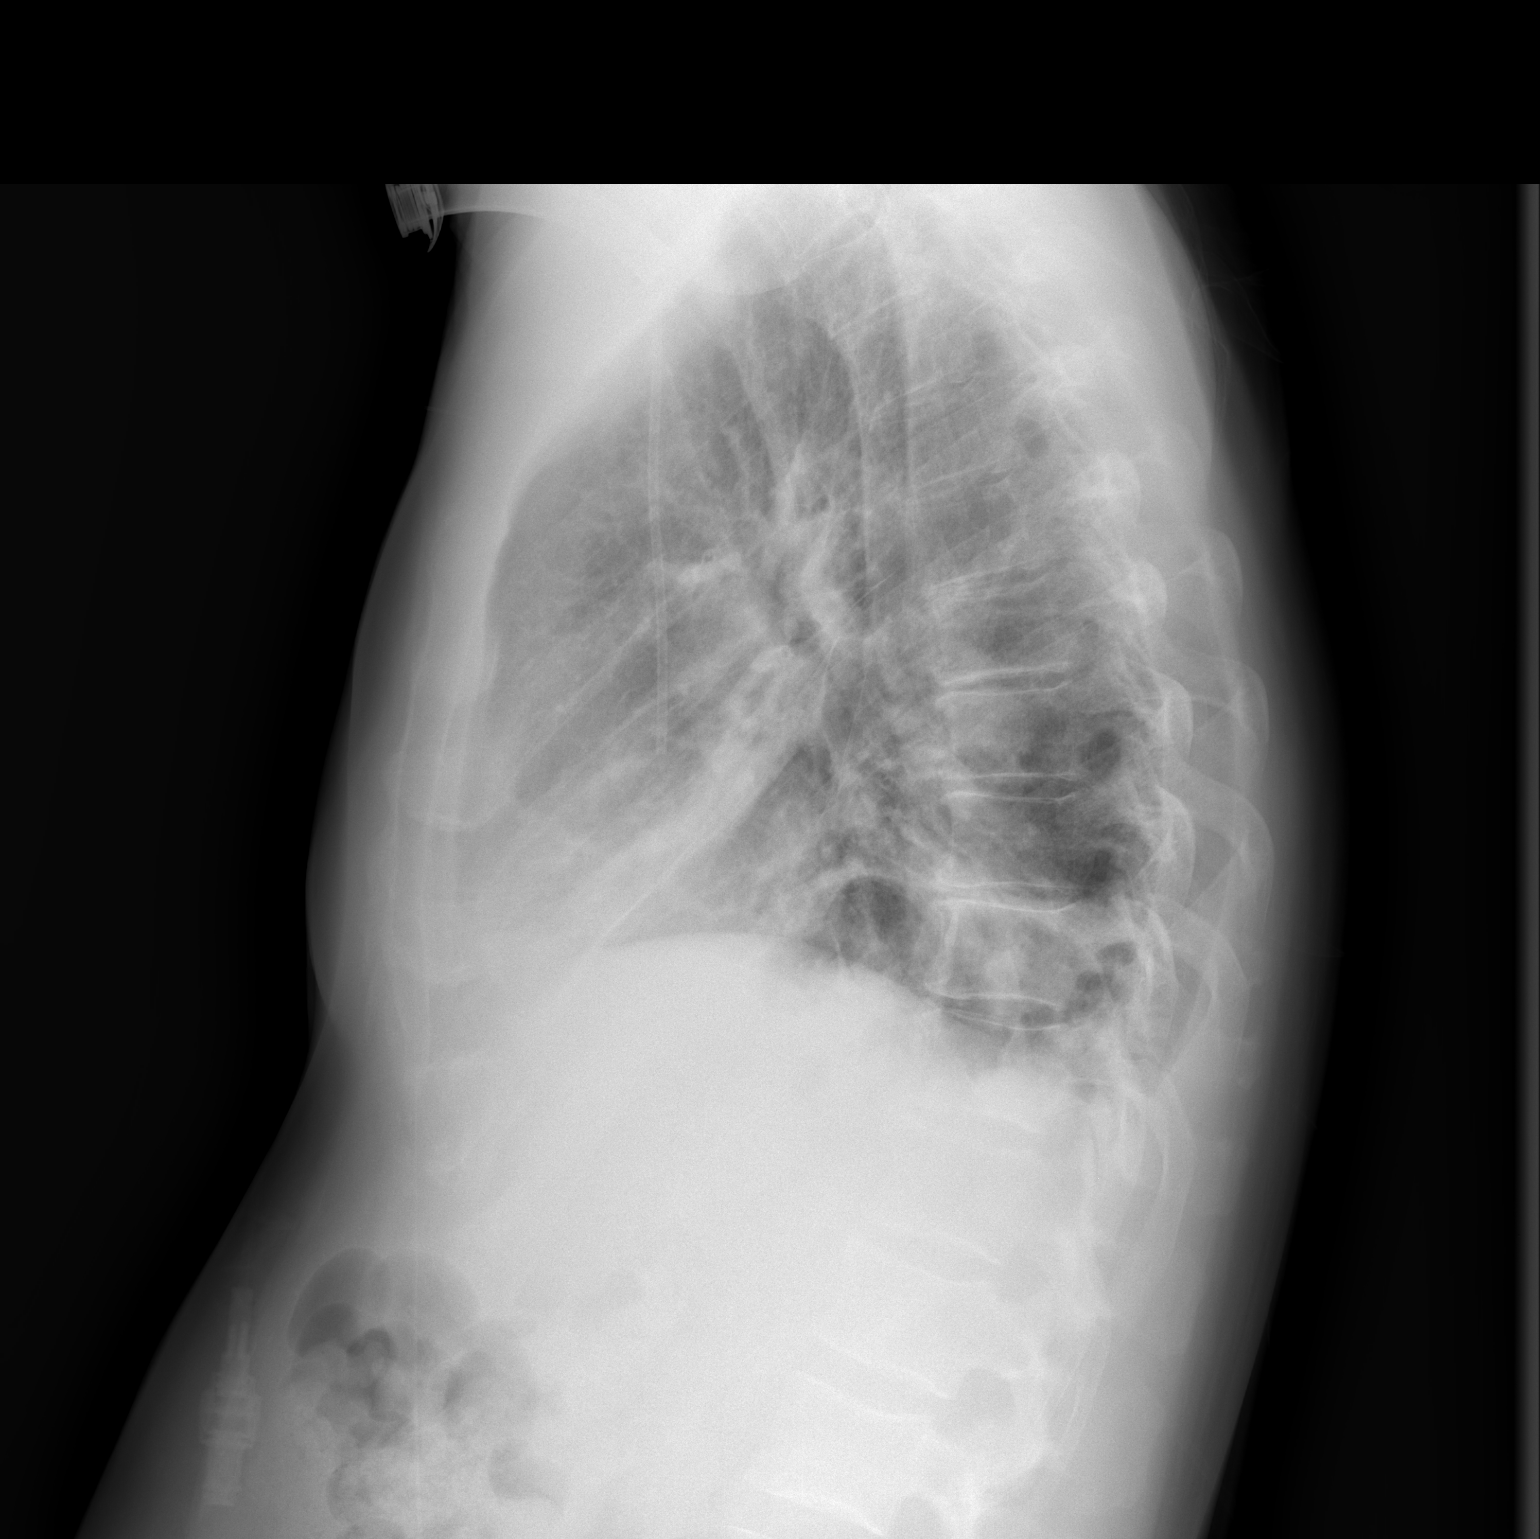

[2 of 2 positions shown; findings below may reference images not displayed]

FINDINGS: Tracheostomy tube again noted. Right Port-A-Cath tip overlies the
distal SVC. Interval decrease in right infrahilar airspace opacity
with slight interval increase in atelectasis or infiltrate in the
posterior left lung base. No edema or substantial pleural effusion.
The cardiopericardial silhouette is within normal limits for size.
The visualized bony structures of the thorax are intact.
IMPRESSION: Interval decrease in right infrahilar opacity with worsening
collapse/ consolidation in the posterior left lower lobe.

## 2016-10-12 ENCOUNTER — Ambulatory Visit (HOSPITAL_BASED_OUTPATIENT_CLINIC_OR_DEPARTMENT_OTHER): Payer: BLUE CROSS/BLUE SHIELD

## 2016-10-12 ENCOUNTER — Other Ambulatory Visit (HOSPITAL_BASED_OUTPATIENT_CLINIC_OR_DEPARTMENT_OTHER): Payer: BLUE CROSS/BLUE SHIELD

## 2016-10-12 ENCOUNTER — Ambulatory Visit (HOSPITAL_BASED_OUTPATIENT_CLINIC_OR_DEPARTMENT_OTHER): Payer: BLUE CROSS/BLUE SHIELD | Admitting: Oncology

## 2016-10-12 ENCOUNTER — Ambulatory Visit: Payer: BLUE CROSS/BLUE SHIELD

## 2016-10-12 VITALS — BP 129/78 | HR 125 | Temp 98.9°F | Resp 20 | Ht 73.0 in | Wt 186.4 lb

## 2016-10-12 DIAGNOSIS — C155 Malignant neoplasm of lower third of esophagus: Secondary | ICD-10-CM

## 2016-10-12 DIAGNOSIS — Z5112 Encounter for antineoplastic immunotherapy: Secondary | ICD-10-CM | POA: Diagnosis not present

## 2016-10-12 DIAGNOSIS — M25512 Pain in left shoulder: Secondary | ICD-10-CM | POA: Diagnosis not present

## 2016-10-12 DIAGNOSIS — Z23 Encounter for immunization: Secondary | ICD-10-CM

## 2016-10-12 DIAGNOSIS — Z5111 Encounter for antineoplastic chemotherapy: Secondary | ICD-10-CM

## 2016-10-12 DIAGNOSIS — G893 Neoplasm related pain (acute) (chronic): Secondary | ICD-10-CM | POA: Diagnosis not present

## 2016-10-12 DIAGNOSIS — Z95828 Presence of other vascular implants and grafts: Secondary | ICD-10-CM

## 2016-10-12 LAB — CBC WITH DIFFERENTIAL/PLATELET
BASO%: 1.4 % (ref 0.0–2.0)
BASOS ABS: 0.1 10*3/uL (ref 0.0–0.1)
EOS ABS: 0.1 10*3/uL (ref 0.0–0.5)
EOS%: 2.6 % (ref 0.0–7.0)
HEMATOCRIT: 37.2 % — AB (ref 38.4–49.9)
HEMOGLOBIN: 11.7 g/dL — AB (ref 13.0–17.1)
LYMPH%: 15.7 % (ref 14.0–49.0)
MCH: 28.7 pg (ref 27.2–33.4)
MCHC: 31.5 g/dL — ABNORMAL LOW (ref 32.0–36.0)
MCV: 91.2 fL (ref 79.3–98.0)
MONO#: 0.6 10*3/uL (ref 0.1–0.9)
MONO%: 12.2 % (ref 0.0–14.0)
NEUT%: 68.1 % (ref 39.0–75.0)
NEUTROS ABS: 3.5 10*3/uL (ref 1.5–6.5)
PLATELETS: 189 10*3/uL (ref 140–400)
RBC: 4.08 10*6/uL — ABNORMAL LOW (ref 4.20–5.82)
RDW: 17.6 % — AB (ref 11.0–14.6)
WBC: 5.1 10*3/uL (ref 4.0–10.3)
lymph#: 0.8 10*3/uL — ABNORMAL LOW (ref 0.9–3.3)

## 2016-10-12 LAB — COMPREHENSIVE METABOLIC PANEL
ALBUMIN: 3.2 g/dL — AB (ref 3.5–5.0)
ALK PHOS: 100 U/L (ref 40–150)
ALT: 12 U/L (ref 0–55)
ANION GAP: 9 meq/L (ref 3–11)
AST: 15 U/L (ref 5–34)
BILIRUBIN TOTAL: 0.46 mg/dL (ref 0.20–1.20)
BUN: 8.8 mg/dL (ref 7.0–26.0)
CALCIUM: 8.9 mg/dL (ref 8.4–10.4)
CO2: 22 mEq/L (ref 22–29)
Chloride: 104 mEq/L (ref 98–109)
Creatinine: 0.9 mg/dL (ref 0.7–1.3)
GLUCOSE: 311 mg/dL — AB (ref 70–140)
Potassium: 4.8 mEq/L (ref 3.5–5.1)
Sodium: 135 mEq/L — ABNORMAL LOW (ref 136–145)
TOTAL PROTEIN: 7 g/dL (ref 6.4–8.3)

## 2016-10-12 MED ORDER — PACLITAXEL CHEMO INJECTION 300 MG/50ML: 80.0000 mg/m2 | Freq: Once | INTRAVENOUS | Status: DC

## 2016-10-12 MED ORDER — FAMOTIDINE IN NACL 20-0.9 MG/50ML-% IV SOLN
20.0000 mg | Freq: Once | INTRAVENOUS | Status: AC
  Administered 2016-10-12: 20 mg via INTRAVENOUS

## 2016-10-12 MED ORDER — SODIUM CHLORIDE 0.9 % IJ SOLN
10.0000 mL | INTRAMUSCULAR | Status: DC | PRN
  Administered 2016-10-12: 10 mL via INTRAVENOUS
  Filled 2016-10-12: qty 10

## 2016-10-12 MED ORDER — SODIUM CHLORIDE 0.9 % IV SOLN
8.0000 mg/kg | Freq: Once | INTRAVENOUS | Status: AC
  Administered 2016-10-12: 600 mg via INTRAVENOUS
  Filled 2016-10-12: qty 50

## 2016-10-12 MED ORDER — FAMOTIDINE IN NACL 20-0.9 MG/50ML-% IV SOLN
INTRAVENOUS | Status: AC
  Filled 2016-10-12: qty 50

## 2016-10-12 MED ORDER — DIPHENHYDRAMINE HCL 50 MG/ML IJ SOLN
25.0000 mg | Freq: Once | INTRAMUSCULAR | Status: AC
  Administered 2016-10-12: 25 mg via INTRAVENOUS

## 2016-10-12 MED ORDER — DEXAMETHASONE SODIUM PHOSPHATE 10 MG/ML IJ SOLN
10.0000 mg | Freq: Once | INTRAMUSCULAR | Status: AC
  Administered 2016-10-12: 10 mg via INTRAVENOUS

## 2016-10-12 MED ORDER — ACETAMINOPHEN 325 MG PO TABS
650.0000 mg | ORAL_TABLET | Freq: Once | ORAL | Status: AC
  Administered 2016-10-12: 650 mg via ORAL

## 2016-10-12 MED ORDER — ACETAMINOPHEN 325 MG PO TABS
ORAL_TABLET | ORAL | Status: AC
  Filled 2016-10-12: qty 2

## 2016-10-12 MED ORDER — DIPHENHYDRAMINE HCL 50 MG/ML IJ SOLN
INTRAMUSCULAR | Status: AC
  Filled 2016-10-12: qty 1

## 2016-10-12 MED ORDER — PACLITAXEL CHEMO INJECTION 300 MG/50ML
80.0000 mg/m2 | Freq: Once | INTRAVENOUS | Status: AC
  Administered 2016-10-12: 162 mg via INTRAVENOUS
  Filled 2016-10-12: qty 27

## 2016-10-12 MED ORDER — ACETAMINOPHEN 160 MG/5ML PO SOLN
650.0000 mg | Freq: Once | ORAL | Status: DC
  Filled 2016-10-12: qty 20.3

## 2016-10-12 MED ORDER — HEPARIN SOD (PORK) LOCK FLUSH 100 UNIT/ML IV SOLN
500.0000 [IU] | Freq: Once | INTRAVENOUS | Status: AC | PRN
  Administered 2016-10-12: 500 [IU]
  Filled 2016-10-12: qty 5

## 2016-10-12 MED ORDER — SODIUM CHLORIDE 0.9% FLUSH
10.0000 mL | INTRAVENOUS | Status: DC | PRN
  Administered 2016-10-12: 10 mL
  Filled 2016-10-12: qty 10

## 2016-10-12 MED ORDER — DEXAMETHASONE SODIUM PHOSPHATE 10 MG/ML IJ SOLN
INTRAMUSCULAR | Status: AC
  Filled 2016-10-12: qty 1

## 2016-10-12 MED ORDER — SODIUM CHLORIDE 0.9 % IV SOLN
10.0000 mg | Freq: Once | INTRAVENOUS | Status: DC
  Filled 2016-10-12: qty 1

## 2016-10-12 MED ORDER — SODIUM CHLORIDE 0.9 % IV SOLN
Freq: Once | INTRAVENOUS | Status: AC
  Administered 2016-10-12: 10:00:00 via INTRAVENOUS

## 2016-10-12 NOTE — Progress Notes (Signed)
Joel Osborne OFFICE PROGRESS NOTE   Diagnosis: Esophagus cancer  INTERVAL HISTORY:   Joel Osborne returns as scheduled. He continues treatment with Taxol and ramucirumab. He complains of pain at the left upper scapula for the past few weeks. He relates the pain to working in his urine. He was seen in the symptom management clinic last week. He was referred for a CT of the chest. He has been taking oxycodone for pain.  The chest CT 10/05/2016 was compared to a CT from 08/10/2016. Enlargement of a right adrenal nodule was noted. Slight enlargement of several pulmonary nodules was also noted. Interlobular septal thickening was seen in the right lung, increased, concerning for lymphatic tumor spread. A stable lytic lesion was noted at T7. No new osseous lesions.   Objective:  Vital signs in last 24 hours:  Blood pressure 129/78, pulse (!) 125, temperature 98.9 F (37.2 C), temperature source Oral, resp. rate 20, height '6\' 1"'$  (1.854 m), weight 186 lb 6.4 oz (84.6 kg), SpO2 97 %.    HEENT:  neck without mass Lymphatics:  no cervical or supra-clavicular nodes Resp:  scattered expiratory rhonchi, no respiratory distress Cardio:  regular rate and rhythm GI:  no hepatosplenomegaly Vascular:  no leg edema Musculoskeletal: The area of discomfort is at the upper medial left scapula. There is fullness at the left trapezius area compared to the right side without a discrete mass.     Portacath/PICC-without erythema  Lab Results:  Lab Results  Component Value Date   WBC 5.1 10/12/2016   HGB 11.7 (L) 10/12/2016   HCT 37.2 (L) 10/12/2016   MCV 91.2 10/12/2016   PLT 189 10/12/2016   NEUTROABS 3.5 10/12/2016   CEA on 09/28/2016: 51.2   Imaging:  CT images from 10/05/2016-reviewed with Dr. Verline Lema   Medications: I have reviewed the patient's current medications.  Assessment/Plan: 1.Adenocarcinoma of the distal esophagus/gastric cardia-status post an endoscopic biopsy on  03/18/2013 confirming adenocarcinoma, HER-2/neunot amplified  -Staging CT scans 03/20/2013 confirmed a distal esophageal/gastric cardia mass, distal paraesophageal lymph nodes and paratracheal nodes concerning for metastatic lymphadenopathy  -A PET scan 03/27/2013 with a multifocal area of hypermetabolic activity involving the thoracic esophagus extending into the gastric cardia and hypermetabolic mediastinal nodes, no distant metastatic disease  -Initiation of radiation 04/07/2013 and weekly Taxol/carboplatin 04/08/2013, radiation completed 05/15/2013  -Restaging PET scan 06/09/2013-no evidence of metastatic disease, no hypermetabolic lymphadenopathy  -Esophagogastrectomy 07/07/2013 revealed a ypT1b,ypN0 tumor with negative surgical margins  -CT neck 06/09/2014 revealed a necrotic lymph node in the right tracheoesophageal groove  -PET scan 11/15/2014 revealed a single focus of hypermetabolism corresponding to the necrotic right upper mediastinum lymph node -EUS biopsy of the paraesophageal lymph node 06/25/2012 confirmed metastatic adenocarcinoma  -Radiation completed 08/11/2014 -Rising CEA May 2016 -PET scan 07/13/2015 with hypermetabolic right paratracheal and right hilar adenopathy -Cycle 1 FOLFOX 08/03/2015 -Cycle 2 FOLFOX 08/17/2015 - cycle 3 FOLFOX 08/31/2015 (oxaliplatin dose reduced secondary to thrombocytopenia)  -Cycle 4 FOLFOX 09/14/2015 -Cycle 5 FOLFOX 09/28/2015 - restaging CTs 09/30/2015 with stable disease involving neck/chest lymph nodes, a slight increase in size of left upper lobe lung nodule  - cycle 6 FOLFOX 10/12/2015  -FOLFOX discontinued secondary to development of bilateral vocal cord paralysis November 2016 - CT scan of the neck and chest on 11/22/15 revealed no definite disease in the neck. At the thoracic inlet region, there is questionably slight increase in prominence of soft tissue at the right tracheoesophageal groove. This could be an enlarging node  or  mass but could also relate to the esophagectomy and pull-through surgery.Enlarging lingular and left lower lobe nodules are worrisome for metastatic disease. Peribronchovascular nodular consolidation in the anterior right lower lobe, slightly more prominent, indeterminate. Cholelithiasis. Left diaphragmatic hernia, stable. -Xeloda 1 week on/1 week off beginning 12/11/2015 -Restaging CTs of the neck and chest 02/07/2016 revealed enlargement of 2 left-sided lung nodules, no other evidence of disease progression -Cycle 1 Pembrolizumab 02/25/2016  -Cycle 2 Pembrolizumab 03/17/2016 -Cycle 3 Pembrolizumab 04/07/2016   -CT 04/20/2016 consistent with progressive disease involving a large pericardial effusion, bilateral pleural effusions, enlarged mediastinal lymph nodes and lung nodules  Cytology from the pericardial fluid 04/21/2016-positive for metastatic adenocarcinoma -CT 05/01/2016 was slight worsening of right hilar lymphadenopathy with near complete collapse of the right middle lobe, nodular metastases in the left lung, stable bilateral pleural effusions  Cycle 1 Taxol/ramucirumab 05/12/2016; day 8 Taxol 05/19/2016; day 15 Taxol/ramucirumab 05/26/2016.  Cycle 2 day 1 Taxol/ramucirumab 06/22/2016  Cycle 2 day 8 Taxol 06/29/2016   Cycle 2 day 15 Taxol/ramucirumab07/28/2017  Cycle 3 day 1 Taxol/ramucirumab 07/20/2016  Cycle 3 day 8 Taxol 07/27/2016  Cycle 3 day 15 Taxol/ramucirumab08/24/2017  Restaging chest CT 08/10/2016-left upper lobe pulmonary nodule similar; left lower lobe pulmonary nodule may have enlarged minimally; improved appearance of the mediastinum and right infrahilar region with decreased soft tissue fullness; improvement in probable left infrahilar adenopathy; decreased left and resolved right pleural effusions; developing right adrenal nodularity; T7 vertebral body lesion suspicious for osseous metastasis.  Cycle 4 Taxol/ramucirumab09/06/2016   cycle 5  Taxol/ramucirumab10/03/2016   restaging chest CT 10/05/2016 with slight enlargement of mediastinal lymph nodes and pulmonary nodules, enlargement of left adrenal lesion,? Lymphatic tumor spread in the right lung  Cycle 6 Taxol/ ramucirumab 10/12/2016  2. Indeterminate 15 mm right liver lesion on the CT scan 03/20/2013, MRI of the liver 06/09/2013 confirmed a right liver hemangioma  3. Anorexia/weight loss and solid dysphagia secondary to #1 -improved 4. History of a colon polyp, status post a polypectomy October 2013  5. history of Thrombocytopenia secondary chemotherapy-the carboplatin wase dose reduced with week 5 of chemotherapy chemotherapy  6. hoarseness secondary to right vocal cord paralysis-CT/PET scan imaging consistent with a malignant right paratracheal lymph node causing the vocal cord paralysis  -Status post a laryngeal plasty at [facility] on 09/02/2014 7. Esophageal stricture-status post dilation 06/08/2015 -Food impaction removal 09/29/2015 -Esophageal stricture dilation 10/06/2015 8. Port-A-Cath placement 07/20/2015 9. Delayed nausea following cycle 1 FOLFOX. Aloxi and Emend added with cycle 2. 10. Thrombocytopenia secondary to chemotherapy  11. Emotional lability/depression-prophylactic Decadron discontinued; Remeron initiated 12. Progressive hoarseness November 2016, diagnosed with bilateral vocal cord paralysis, status post a tracheostomy and bilateral injection laryngoplasty at [facility] on 10/27/2015 13. Pericardial window 04/21/2016  14. High fever 07/06/2016-chest x-ray suggestive of possible left lower lobe pneumonia-treated with Augmentin     Disposition:  His overall status appears unchanged. I reviewed the CT images with him. There is been minimal progression of disease in the chest. We decided to continue the current chemotherapy regimen. He will proceed with cycle 4 Taxol/ramucirumab today.  The etiology of the discomfort at the left upper back is unclear. I  will review the CT images in radiology. He will continue oxycodone as needed for pain. Dr. Verline Lema will return for Taxol and one week and an office visit in 2 weeks.  Betsy Coder, MD  10/12/2016  10:02 AM

## 2016-10-12 NOTE — Patient Instructions (Signed)
[facility] Discharge Instructions for Patients Receiving Chemotherapy  Today you received the following chemotherapy agents:  Cyramza and Taxol.  To help prevent nausea and vomiting after your treatment, we encourage you to take your nausea medication as directed.   If you develop nausea and vomiting that is not controlled by your nausea medication, call the clinic.   BELOW ARE SYMPTOMS THAT SHOULD BE REPORTED IMMEDIATELY:  *FEVER GREATER THAN 100.5 F  *CHILLS WITH OR WITHOUT FEVER  NAUSEA AND VOMITING THAT IS NOT CONTROLLED WITH YOUR NAUSEA MEDICATION  *UNUSUAL SHORTNESS OF BREATH  *UNUSUAL BRUISING OR BLEEDING  TENDERNESS IN MOUTH AND THROAT WITH OR WITHOUT PRESENCE OF ULCERS  *URINARY PROBLEMS  *BOWEL PROBLEMS  UNUSUAL RASH Items with * indicate a potential emergency and should be followed up as soon as possible.  Feel free to call the clinic you have any questions or concerns. The clinic phone number is [phone].  Please show the [place] at check-in to the Emergency Department and triage nurse.

## 2016-10-12 NOTE — Patient Instructions (Signed)

## 2016-10-16 ENCOUNTER — Other Ambulatory Visit: Payer: Self-pay | Admitting: Nurse Practitioner

## 2016-10-16 DIAGNOSIS — C153 Malignant neoplasm of upper third of esophagus: Secondary | ICD-10-CM

## 2016-10-16 MED FILL — CYCLOBENZAPRINE 10 MG TAB: 10 | 10 days supply | Qty: 30 | Fill #0

## 2016-10-17 ENCOUNTER — Telehealth: Payer: Self-pay | Admitting: Oncology

## 2016-10-17 ENCOUNTER — Other Ambulatory Visit: Payer: Self-pay | Admitting: *Deleted

## 2016-10-17 DIAGNOSIS — C155 Malignant neoplasm of lower third of esophagus: Secondary | ICD-10-CM

## 2016-10-17 NOTE — Telephone Encounter (Signed)
Returned call to patient in regards to next scheduled appointments. Confirmed next scheduled appointments with patient.

## 2016-10-19 ENCOUNTER — Ambulatory Visit (HOSPITAL_BASED_OUTPATIENT_CLINIC_OR_DEPARTMENT_OTHER): Payer: BLUE CROSS/BLUE SHIELD

## 2016-10-19 ENCOUNTER — Other Ambulatory Visit (HOSPITAL_BASED_OUTPATIENT_CLINIC_OR_DEPARTMENT_OTHER): Payer: BLUE CROSS/BLUE SHIELD

## 2016-10-19 ENCOUNTER — Ambulatory Visit: Payer: BLUE CROSS/BLUE SHIELD

## 2016-10-19 VITALS — BP 123/80 | HR 96 | Temp 97.5°F | Resp 18

## 2016-10-19 DIAGNOSIS — Z95828 Presence of other vascular implants and grafts: Secondary | ICD-10-CM

## 2016-10-19 DIAGNOSIS — C155 Malignant neoplasm of lower third of esophagus: Secondary | ICD-10-CM

## 2016-10-19 DIAGNOSIS — Z5111 Encounter for antineoplastic chemotherapy: Secondary | ICD-10-CM | POA: Diagnosis not present

## 2016-10-19 LAB — CBC WITH DIFFERENTIAL/PLATELET
BASO%: 1.4 % (ref 0.0–2.0)
Basophils Absolute: 0.1 10*3/uL (ref 0.0–0.1)
EOS ABS: 0.1 10*3/uL (ref 0.0–0.5)
EOS%: 1.8 % (ref 0.0–7.0)
HCT: 35.1 % — ABNORMAL LOW (ref 38.4–49.9)
HEMOGLOBIN: 11.3 g/dL — AB (ref 13.0–17.1)
LYMPH%: 9.9 % — ABNORMAL LOW (ref 14.0–49.0)
MCH: 28.3 pg (ref 27.2–33.4)
MCHC: 32.1 g/dL (ref 32.0–36.0)
MCV: 87.9 fL (ref 79.3–98.0)
MONO#: 0.5 10*3/uL (ref 0.1–0.9)
MONO%: 5.8 % (ref 0.0–14.0)
NEUT%: 81.1 % — ABNORMAL HIGH (ref 39.0–75.0)
NEUTROS ABS: 6.6 10*3/uL — AB (ref 1.5–6.5)
PLATELETS: 223 10*3/uL (ref 140–400)
RBC: 3.99 10*6/uL — ABNORMAL LOW (ref 4.20–5.82)
RDW: 19.5 % — AB (ref 11.0–14.6)
WBC: 8.2 10*3/uL (ref 4.0–10.3)
lymph#: 0.8 10*3/uL — ABNORMAL LOW (ref 0.9–3.3)

## 2016-10-19 MED ORDER — SODIUM CHLORIDE 0.9 % IV SOLN: 10.0000 mg | Freq: Once | INTRAVENOUS | Status: DC

## 2016-10-19 MED ORDER — PACLITAXEL CHEMO INJECTION 300 MG/50ML
80.0000 mg/m2 | Freq: Once | INTRAVENOUS | Status: AC
  Administered 2016-10-19: 162 mg via INTRAVENOUS
  Filled 2016-10-19: qty 27

## 2016-10-19 MED ORDER — SODIUM CHLORIDE 0.9 % IJ SOLN
10.0000 mL | INTRAMUSCULAR | Status: DC | PRN
  Administered 2016-10-19: 10 mL via INTRAVENOUS
  Filled 2016-10-19: qty 10

## 2016-10-19 MED ORDER — DEXAMETHASONE SODIUM PHOSPHATE 10 MG/ML IJ SOLN
INTRAMUSCULAR | Status: AC
  Filled 2016-10-19: qty 1

## 2016-10-19 MED ORDER — HEPARIN SOD (PORK) LOCK FLUSH 100 UNIT/ML IV SOLN
500.0000 [IU] | Freq: Once | INTRAVENOUS | Status: AC | PRN
  Administered 2016-10-19: 500 [IU]
  Filled 2016-10-19: qty 5

## 2016-10-19 MED ORDER — FAMOTIDINE IN NACL 20-0.9 MG/50ML-% IV SOLN
INTRAVENOUS | Status: AC
  Filled 2016-10-19: qty 50

## 2016-10-19 MED ORDER — SODIUM CHLORIDE 0.9 % IV SOLN
Freq: Once | INTRAVENOUS | Status: AC
  Administered 2016-10-19: 15:00:00 via INTRAVENOUS

## 2016-10-19 MED ORDER — SODIUM CHLORIDE 0.9% FLUSH
10.0000 mL | INTRAVENOUS | Status: DC | PRN
  Administered 2016-10-19: 10 mL
  Filled 2016-10-19: qty 10

## 2016-10-19 MED ORDER — DIPHENHYDRAMINE HCL 50 MG/ML IJ SOLN
25.0000 mg | Freq: Once | INTRAMUSCULAR | Status: AC
  Administered 2016-10-19: 25 mg via INTRAVENOUS

## 2016-10-19 MED ORDER — FAMOTIDINE IN NACL 20-0.9 MG/50ML-% IV SOLN
20.0000 mg | Freq: Once | INTRAVENOUS | Status: AC
  Administered 2016-10-19: 20 mg via INTRAVENOUS

## 2016-10-19 MED ORDER — DEXAMETHASONE SODIUM PHOSPHATE 10 MG/ML IJ SOLN
10.0000 mg | Freq: Once | INTRAMUSCULAR | Status: AC
  Administered 2016-10-19: 10 mg via INTRAVENOUS

## 2016-10-19 MED ORDER — DIPHENHYDRAMINE HCL 50 MG/ML IJ SOLN
INTRAMUSCULAR | Status: AC
  Filled 2016-10-19: qty 1

## 2016-10-19 NOTE — Patient Instructions (Signed)
[facility] Discharge Instructions for Patients Receiving Chemotherapy  Today you received the following chemotherapy agents Taxol   To help prevent nausea and vomiting after your treatment, we encourage you to take your nausea medication as directed.   If you develop nausea and vomiting that is not controlled by your nausea medication, call the clinic.   BELOW ARE SYMPTOMS THAT SHOULD BE REPORTED IMMEDIATELY:  *FEVER GREATER THAN 100.5 F  *CHILLS WITH OR WITHOUT FEVER  NAUSEA AND VOMITING THAT IS NOT CONTROLLED WITH YOUR NAUSEA MEDICATION  *UNUSUAL SHORTNESS OF BREATH  *UNUSUAL BRUISING OR BLEEDING  TENDERNESS IN MOUTH AND THROAT WITH OR WITHOUT PRESENCE OF ULCERS  *URINARY PROBLEMS  *BOWEL PROBLEMS  UNUSUAL RASH Items with * indicate a potential emergency and should be followed up as soon as possible.  Feel free to call the clinic you have any questions or concerns. The clinic phone number is [phone].  Please show the CHEMO ALERT CARD at check-in to the Emergency Department and triage nurse.   

## 2016-10-19 NOTE — Progress Notes (Signed)
Per Dr. Benay Spice: Proceed with chemo based on 11/2 chemistry panel, no CMET needed today. Hassan Rowan, Infusion RN made aware.

## 2016-10-22 ENCOUNTER — Other Ambulatory Visit: Payer: Self-pay | Admitting: Oncology

## 2016-10-26 ENCOUNTER — Other Ambulatory Visit (HOSPITAL_BASED_OUTPATIENT_CLINIC_OR_DEPARTMENT_OTHER): Payer: BLUE CROSS/BLUE SHIELD

## 2016-10-26 ENCOUNTER — Encounter: Payer: Self-pay | Admitting: *Deleted

## 2016-10-26 ENCOUNTER — Telehealth: Payer: Self-pay | Admitting: Oncology

## 2016-10-26 ENCOUNTER — Ambulatory Visit (HOSPITAL_BASED_OUTPATIENT_CLINIC_OR_DEPARTMENT_OTHER): Payer: BLUE CROSS/BLUE SHIELD | Admitting: Nurse Practitioner

## 2016-10-26 ENCOUNTER — Ambulatory Visit (HOSPITAL_BASED_OUTPATIENT_CLINIC_OR_DEPARTMENT_OTHER): Payer: BLUE CROSS/BLUE SHIELD

## 2016-10-26 ENCOUNTER — Ambulatory Visit: Payer: BLUE CROSS/BLUE SHIELD

## 2016-10-26 DIAGNOSIS — Z5112 Encounter for antineoplastic immunotherapy: Secondary | ICD-10-CM

## 2016-10-26 DIAGNOSIS — G620 Drug-induced polyneuropathy: Secondary | ICD-10-CM

## 2016-10-26 DIAGNOSIS — C155 Malignant neoplasm of lower third of esophagus: Secondary | ICD-10-CM

## 2016-10-26 DIAGNOSIS — C153 Malignant neoplasm of upper third of esophagus: Secondary | ICD-10-CM

## 2016-10-26 DIAGNOSIS — Z95828 Presence of other vascular implants and grafts: Secondary | ICD-10-CM

## 2016-10-26 DIAGNOSIS — R634 Abnormal weight loss: Secondary | ICD-10-CM | POA: Diagnosis not present

## 2016-10-26 LAB — CBC WITH DIFFERENTIAL/PLATELET
BASO%: 2.7 % — AB (ref 0.0–2.0)
Basophils Absolute: 0.1 10*3/uL (ref 0.0–0.1)
EOS%: 3 % (ref 0.0–7.0)
Eosinophils Absolute: 0.1 10*3/uL (ref 0.0–0.5)
HEMATOCRIT: 34.3 % — AB (ref 38.4–49.9)
HEMOGLOBIN: 11 g/dL — AB (ref 13.0–17.1)
LYMPH#: 0.7 10*3/uL — AB (ref 0.9–3.3)
LYMPH%: 24.2 % (ref 14.0–49.0)
MCH: 28.1 pg (ref 27.2–33.4)
MCHC: 32.1 g/dL (ref 32.0–36.0)
MCV: 87.6 fL (ref 79.3–98.0)
MONO#: 0.2 10*3/uL (ref 0.1–0.9)
MONO%: 6.1 % (ref 0.0–14.0)
NEUT#: 1.9 10*3/uL (ref 1.5–6.5)
NEUT%: 64 % (ref 39.0–75.0)
Platelets: 199 10*3/uL (ref 140–400)
RBC: 3.91 10*6/uL — ABNORMAL LOW (ref 4.20–5.82)
RDW: 20.1 % — AB (ref 11.0–14.6)
WBC: 2.9 10*3/uL — ABNORMAL LOW (ref 4.0–10.3)

## 2016-10-26 MED ORDER — ZOLPIDEM TARTRATE 5 MG PO TABS: 5.0000 mg | ORAL_TABLET | Freq: Every evening | ORAL | 0 refills | Status: DC | PRN

## 2016-10-26 MED ORDER — DIPHENHYDRAMINE HCL 50 MG/ML IJ SOLN
25.0000 mg | Freq: Once | INTRAMUSCULAR | Status: AC
  Administered 2016-10-26: 25 mg via INTRAVENOUS

## 2016-10-26 MED ORDER — HEPARIN SOD (PORK) LOCK FLUSH 100 UNIT/ML IV SOLN
500.0000 [IU] | Freq: Once | INTRAVENOUS | Status: AC | PRN
  Administered 2016-10-26: 500 [IU]
  Filled 2016-10-26: qty 5

## 2016-10-26 MED ORDER — SODIUM CHLORIDE 0.9 % IV SOLN
Freq: Once | INTRAVENOUS | Status: AC
  Administered 2016-10-26: 13:00:00 via INTRAVENOUS

## 2016-10-26 MED ORDER — SODIUM CHLORIDE 0.9 % IJ SOLN
10.0000 mL | INTRAMUSCULAR | Status: DC | PRN
  Administered 2016-10-26: 10 mL via INTRAVENOUS
  Filled 2016-10-26: qty 10

## 2016-10-26 MED ORDER — SODIUM CHLORIDE 0.9 % IV SOLN
8.0000 mg/kg | Freq: Once | INTRAVENOUS | Status: AC
  Administered 2016-10-26: 600 mg via INTRAVENOUS
  Filled 2016-10-26: qty 50

## 2016-10-26 MED ORDER — ACETAMINOPHEN 325 MG PO TABS
ORAL_TABLET | ORAL | Status: AC
  Filled 2016-10-26: qty 2

## 2016-10-26 MED ORDER — ACETAMINOPHEN 325 MG PO TABS
650.0000 mg | ORAL_TABLET | Freq: Once | ORAL | Status: AC
  Administered 2016-10-26: 650 mg via ORAL

## 2016-10-26 MED ORDER — ACETAMINOPHEN 160 MG/5ML PO SOLN
650.0000 mg | Freq: Once | ORAL | Status: DC
  Filled 2016-10-26: qty 20.3

## 2016-10-26 MED ORDER — DIPHENHYDRAMINE HCL 50 MG/ML IJ SOLN
INTRAMUSCULAR | Status: AC
  Filled 2016-10-26: qty 1

## 2016-10-26 MED ORDER — SODIUM CHLORIDE 0.9% FLUSH
10.0000 mL | INTRAVENOUS | Status: DC | PRN
  Administered 2016-10-26: 10 mL
  Filled 2016-10-26: qty 10

## 2016-10-26 MED ORDER — ACETAMINOPHEN 325 MG PO TABS
ORAL_TABLET | ORAL | Status: AC
  Filled 2016-10-26: qty 1

## 2016-10-26 NOTE — Progress Notes (Addendum)
[facility] OFFICE PROGRESS NOTE   Diagnosis:  Esophagus cancer  INTERVAL HISTORY:   Joel Osborne returns as scheduled. He continues Taxol and ramucirumab. He completed the day 8 Taxol 10/19/2016. He notes a "painful, tingly" sensation in the feet. He also has a "pins and needles" sensation across the top of his head. He has numbness in the fingertips. 2 days ago he had some loose stools. Appetite varies. He has lost some weight since his last visit. No significant nausea/vomiting. Pain at the left scapula is better.  Objective:  Vital signs in last 24 hours:  Blood pressure 124/74, pulse (!) 106, temperature 98.3 F (36.8 C), temperature source Oral, resp. rate 18, height _0  (1.854 m), weight 175 lb 14.4 oz (79.8 kg), SpO2 99 %.    HEENT: No thrush or ulcers. Lymphatics: No palpable cervical or supraclavicular lymph nodes. Resp: Lungs clear bilaterally. Cardio: Regular rate and rhythm. GI: Abdomen soft and nontender. No organomegaly. Vascular: No leg edema. Neuro: Vibratory sense mildly decreased over the fingertips per tuning fork exam.  Port-A-Cath without erythema.    Lab Results:  Lab Results  Component Value Date   WBC 2.9 (L) 10/26/2016   HGB 11.0 (L) 10/26/2016   HCT 34.3 (L) 10/26/2016   MCV 87.6 10/26/2016   PLT 199 10/26/2016   NEUTROABS 1.9 10/26/2016    Imaging:  No results found.  Medications: I have reviewed the patient's current medications.  Assessment/Plan: 1.Adenocarcinoma of the distal esophagus/gastric cardia-status post an endoscopic biopsy on 03/18/2013 confirming adenocarcinoma, HER-2/neunot amplified  -Staging CT scans 03/20/2013 confirmed a distal esophageal/gastric cardia mass, distal paraesophageal lymph nodes and paratracheal nodes concerning for metastatic lymphadenopathy  -A PET scan 03/27/2013 with a multifocal area of hypermetabolic activity involving the thoracic esophagus extending into the gastric cardia and  hypermetabolic mediastinal nodes, no distant metastatic disease  -Initiation of radiation 04/07/2013 and weekly Taxol/carboplatin 04/08/2013, radiation completed 05/15/2013  -Restaging PET scan 06/09/2013-no evidence of metastatic disease, no hypermetabolic lymphadenopathy  -Esophagogastrectomy 07/07/2013 revealed a ypT1b,ypN0 tumor with negative surgical margins  -CT neck 06/09/2014 revealed a necrotic lymph node in the right tracheoesophageal groove  -PET scan 11/15/2014 revealed a single focus of hypermetabolism corresponding to the necrotic right upper mediastinum lymph node -EUS biopsy of the paraesophageal lymph node 06/25/2012 confirmed metastatic adenocarcinoma  -Radiation completed 08/11/2014 -Rising CEA May 2016 -PET scan 07/13/2015 with hypermetabolic right paratracheal and right hilar adenopathy -Cycle 1 FOLFOX 08/03/2015 -Cycle 2 FOLFOX 08/17/2015 - cycle 3 FOLFOX 08/31/2015 (oxaliplatin dose reduced secondary to thrombocytopenia)  -Cycle 4 FOLFOX 09/14/2015 -Cycle 5 FOLFOX 09/28/2015 - restaging CTs 09/30/2015 with stable disease involving neck/chest lymph nodes, a slight increase in size of left upper lobe lung nodule  - cycle 6 FOLFOX 10/12/2015  -FOLFOX discontinued secondary to development of bilateral vocal cord paralysis November 2016 - CT scan of the neck and chest on 11/22/15 revealed no definite disease in the neck. At the thoracic inlet region, there is questionably slight increase in prominence of soft tissue at the right tracheoesophageal groove. This could be an enlarging node or mass but could also relate to the esophagectomy and pull-through surgery.Enlarging lingular and left lower lobe nodules are worrisome for metastatic disease. Peribronchovascular nodular consolidation in the anterior right lower lobe, slightly more prominent, indeterminate. Cholelithiasis. Left diaphragmatic hernia, stable. -Xeloda 1 week on/1 week off beginning 12/11/2015 -Restaging  CTs of the neck and chest 02/07/2016 revealed enlargement of 2 left-sided lung nodules, no other evidence of disease progression -  Cycle 1 Pembrolizumab 02/25/2016  -Cycle 2 Pembrolizumab 03/17/2016 -Cycle 3 Pembrolizumab 04/07/2016   -CT 04/20/2016 consistent with progressive disease involving a large pericardial effusion, bilateral pleural effusions, enlarged mediastinal lymph nodes and lung nodules  Cytology from the pericardial fluid 04/21/2016-positive for metastatic adenocarcinoma -CT 05/01/2016 was slight worsening of right hilar lymphadenopathy with near complete collapse of the right middle lobe, nodular metastases in the left lung, stable bilateral pleural effusions  Cycle 1 Taxol/ramucirumab 05/12/2016; day 8 Taxol 05/19/2016; day 15 Taxol/ramucirumab 05/26/2016.  Cycle 2 day 1 Taxol/ramucirumab 06/22/2016  Cycle 2 day 8 Taxol 06/29/2016   Cycle 2 day 15 Taxol/ramucirumab07/28/2017  Cycle 3 day 1 Taxol/ramucirumab 07/20/2016  Cycle 3 day 8 Taxol 07/27/2016  Cycle 3 day 15 Taxol/ramucirumab08/24/2017  Restaging chest CT 08/10/2016-left upper lobe pulmonary nodule similar; left lower lobe pulmonary nodule may have enlarged minimally; improved appearance of the mediastinum and right infrahilar region with decreased soft tissue fullness; improvement in probable left infrahilar adenopathy; decreased left and resolved right pleural effusions; developing right adrenal nodularity; T7 vertebral body lesion suspicious for osseous metastasis.  Cycle 4 Taxol/ramucirumab09/06/2016   cycle 5 Taxol/ramucirumab10/03/2016   restaging chest CT 10/05/2016 with slight enlargement of mediastinal lymph nodes and pulmonary nodules, enlargement of left adrenal lesion,? Lymphatic tumor spread in the right lung  Cycle 6 Taxol/ ramucirumab 10/12/2016; Taxol placed on hold beginning 10/26/2016 due to neuropathy symptoms, continuation of ramucirumab alone  2. Indeterminate 15 mm right  liver lesion on the CT scan 03/20/2013, MRI of the liver 06/09/2013 confirmed a right liver hemangioma  3. Anorexia/weight loss and solid dysphagia secondary to #1 -improved 4. History of a colon polyp, status post a polypectomy October 2013  5. history of Thrombocytopenia secondary chemotherapy-the carboplatin wase dose reduced with week 5 of chemotherapy chemotherapy  6. hoarseness secondary to right vocal cord paralysis-CT/PET scan imaging consistent with a malignant right paratracheal lymph node causing the vocal cord paralysis  -Status post a laryngeal plasty at [facility] on 09/02/2014 7. Esophageal stricture-status post dilation 06/08/2015 -Food impaction removal 09/29/2015 -Esophageal stricture dilation 10/06/2015 8. Port-A-Cath placement 07/20/2015 9. Delayed nausea following cycle 1 FOLFOX. Aloxi and Emend added with cycle 2. 10. Thrombocytopenia secondary to chemotherapy  11. Emotional lability/depression-prophylactic Decadron discontinued; Remeron initiated 12. Progressive hoarseness November 2016, diagnosed with bilateral vocal cord paralysis, status post a tracheostomy and bilateral injection laryngoplasty at [facility] on 10/27/2015 13. Pericardial window 04/21/2016  14. High fever 07/06/2016-chest x-ray suggestive of possible left lower lobe pneumonia-treated with Augmentin  15. 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 Taxol placed on hold beginning 10/26/2016. 16. Weight loss 10/26/2016, unclear etiology.   Disposition: Joel Osborne overall status appears unchanged. He has developed progressive neuropathy symptoms in the hands and feet. He understands this is likely due to Taxol. We will place the Taxol on hold and continue ramucirumab alone.  The etiology of the weight loss is unclear. We will continue to monitor closely.  He will return for a follow-up visit and ramucirumab in 2 weeks. He will contact the office in the interim with any  problems.  Patient seen with Dr. Benay Spice. 25 minutes were spent face-to-face at today's visit with the majority of that time involved in counseling/coordination of care.    Joel Osborne, Joel Osborne ANP/GNP-BC   10/26/2016  11:16 AM  This was a shared visit with Joel Osborne. We discussed the restaging CT findings and treatment options with Joel Osborne and his wife. He has developed progressive neuropathy symptoms. The  Taxol was placed on hold. He we will continue every 2 week ramucirumab.  Julieanne Manson, M.D.

## 2016-10-26 NOTE — Telephone Encounter (Signed)
Appointments scheduled per 11/16 LOS. Patient given AVS report and calendars with future scheduled appointments.

## 2016-10-26 NOTE — Progress Notes (Signed)
Oncology Nurse Navigator Documentation  Oncology Nurse Navigator Flowsheets 10/26/2016  Navigator Location [facility]  Navigator Encounter Type Lobby  Patient Visit Type MedOnc;Follow-up  Treatment Phase Active Tx--Taxol/Cyramza (having some symptom management issues-he will discuss with NP)  Barriers/Navigation Needs No barriers at this time;No Questions;No Needs  Education -  Interventions None required  Coordination of Care -  Education Method -  Support Groups/Services -  Acuity -  Time Spent with Patient -

## 2016-10-27 ENCOUNTER — Telehealth: Payer: Self-pay | Admitting: *Deleted

## 2016-10-27 NOTE — Telephone Encounter (Signed)
Per LOS I have scheduled appts and notified the scheduler. Notified the 11/30 appts first available given

## 2016-11-06 MED FILL — MIRTAZAPINE 15 MG TABLET: 15 | 30 days supply | Qty: 30 | Fill #1

## 2016-11-09 ENCOUNTER — Telehealth: Payer: Self-pay | Admitting: Oncology

## 2016-11-09 ENCOUNTER — Ambulatory Visit (HOSPITAL_BASED_OUTPATIENT_CLINIC_OR_DEPARTMENT_OTHER): Payer: BLUE CROSS/BLUE SHIELD | Admitting: Oncology

## 2016-11-09 ENCOUNTER — Ambulatory Visit (HOSPITAL_BASED_OUTPATIENT_CLINIC_OR_DEPARTMENT_OTHER): Payer: BLUE CROSS/BLUE SHIELD

## 2016-11-09 ENCOUNTER — Other Ambulatory Visit (HOSPITAL_BASED_OUTPATIENT_CLINIC_OR_DEPARTMENT_OTHER): Payer: BLUE CROSS/BLUE SHIELD

## 2016-11-09 ENCOUNTER — Ambulatory Visit: Payer: BLUE CROSS/BLUE SHIELD

## 2016-11-09 VITALS — BP 119/77 | HR 95 | Temp 98.4°F | Resp 19 | Ht 73.0 in | Wt 182.9 lb

## 2016-11-09 DIAGNOSIS — Z5112 Encounter for antineoplastic immunotherapy: Secondary | ICD-10-CM | POA: Diagnosis not present

## 2016-11-09 DIAGNOSIS — G620 Drug-induced polyneuropathy: Secondary | ICD-10-CM | POA: Diagnosis not present

## 2016-11-09 DIAGNOSIS — C155 Malignant neoplasm of lower third of esophagus: Secondary | ICD-10-CM

## 2016-11-09 DIAGNOSIS — M545 Low back pain: Secondary | ICD-10-CM | POA: Diagnosis not present

## 2016-11-09 DIAGNOSIS — C153 Malignant neoplasm of upper third of esophagus: Secondary | ICD-10-CM

## 2016-11-09 DIAGNOSIS — Z95828 Presence of other vascular implants and grafts: Secondary | ICD-10-CM

## 2016-11-09 LAB — COMPREHENSIVE METABOLIC PANEL
ALBUMIN: 3.1 g/dL — AB (ref 3.5–5.0)
ALK PHOS: 87 U/L (ref 40–150)
ALT: 9 U/L (ref 0–55)
AST: 15 U/L (ref 5–34)
Anion Gap: 7 mEq/L (ref 3–11)
BUN: 7.9 mg/dL (ref 7.0–26.0)
CALCIUM: 9.2 mg/dL (ref 8.4–10.4)
CO2: 24 mEq/L (ref 22–29)
CREATININE: 0.7 mg/dL (ref 0.7–1.3)
Chloride: 108 mEq/L (ref 98–109)
EGFR: >90 mL/min/{1.73_m2} (ref >90)
Glucose: 132 mg/dl (ref 70–140)
Potassium: 4 mEq/L (ref 3.5–5.1)
Sodium: 139 mEq/L (ref 136–145)
Total Bilirubin: 0.39 mg/dL (ref 0.20–1.20)
Total Protein: 6.7 g/dL (ref 6.4–8.3)

## 2016-11-09 LAB — CBC WITH DIFFERENTIAL/PLATELET
BASO%: 1.2 % (ref 0.0–2.0)
Basophils Absolute: 0.1 10*3/uL (ref 0.0–0.1)
EOS%: 2.7 % (ref 0.0–7.0)
Eosinophils Absolute: 0.2 10*3/uL (ref 0.0–0.5)
HEMATOCRIT: 36.3 % — AB (ref 38.4–49.9)
HEMOGLOBIN: 11.6 g/dL — AB (ref 13.0–17.1)
LYMPH#: 0.7 10*3/uL — AB (ref 0.9–3.3)
LYMPH%: 10.3 % — ABNORMAL LOW (ref 14.0–49.0)
MCH: 27.6 pg (ref 27.2–33.4)
MCHC: 31.8 g/dL — ABNORMAL LOW (ref 32.0–36.0)
MCV: 86.7 fL (ref 79.3–98.0)
MONO#: 0.6 10*3/uL (ref 0.1–0.9)
MONO%: 8.6 % (ref 0.0–14.0)
NEUT#: 4.9 10*3/uL (ref 1.5–6.5)
NEUT%: 77.2 % — ABNORMAL HIGH (ref 39.0–75.0)
Platelets: 183 10*3/uL (ref 140–400)
RBC: 4.19 10*6/uL — ABNORMAL LOW (ref 4.20–5.82)
RDW: 19.5 % — AB (ref 11.0–14.6)
WBC: 6.4 10*3/uL (ref 4.0–10.3)

## 2016-11-09 LAB — CEA (IN HOUSE-CHCC): CEA (CHCC-IN HOUSE): 107.75 ng/mL — AB (ref 0.00–5.00)

## 2016-11-09 MED ORDER — SODIUM CHLORIDE 0.9% FLUSH
10.0000 mL | INTRAVENOUS | Status: DC | PRN
  Administered 2016-11-09: 10 mL
  Filled 2016-11-09: qty 10

## 2016-11-09 MED ORDER — CYCLOBENZAPRINE HCL 10 MG PO TABS: 10.0000 mg | ORAL_TABLET | Freq: Three times a day (TID) | ORAL | 0 refills | Status: DC | PRN

## 2016-11-09 MED ORDER — ACETAMINOPHEN 325 MG PO TABS
ORAL_TABLET | ORAL | Status: AC
  Filled 2016-11-09: qty 2

## 2016-11-09 MED ORDER — SODIUM CHLORIDE 0.9 % IJ SOLN
10.0000 mL | INTRAMUSCULAR | Status: DC | PRN
  Administered 2016-11-09: 10 mL via INTRAVENOUS
  Filled 2016-11-09: qty 10

## 2016-11-09 MED ORDER — SODIUM CHLORIDE 0.9 % IV SOLN
8.0000 mg/kg | Freq: Once | INTRAVENOUS | Status: AC
  Administered 2016-11-09: 600 mg via INTRAVENOUS
  Filled 2016-11-09: qty 50

## 2016-11-09 MED ORDER — HEPARIN SOD (PORK) LOCK FLUSH 100 UNIT/ML IV SOLN
500.0000 [IU] | Freq: Once | INTRAVENOUS | Status: AC | PRN
  Administered 2016-11-09: 500 [IU]
  Filled 2016-11-09: qty 5

## 2016-11-09 MED ORDER — ACETAMINOPHEN 325 MG PO TABS
650.0000 mg | ORAL_TABLET | Freq: Once | ORAL | Status: AC
  Administered 2016-11-09: 650 mg via ORAL

## 2016-11-09 MED ORDER — SODIUM CHLORIDE 0.9 % IV SOLN
Freq: Once | INTRAVENOUS | Status: AC
  Administered 2016-11-09: 10:00:00 via INTRAVENOUS

## 2016-11-09 MED FILL — CYCLOBENZAPRINE 10 MG TAB: 10 | 10 days supply | Qty: 30 | Fill #0

## 2016-11-09 NOTE — Progress Notes (Signed)
[facility] OFFICE PROGRESS NOTE   Diagnosis: Esophagus cancer  INTERVAL HISTORY:   He returns as scheduled. He completed another treatment with ramucirumab on 10/26/2016. He continues to have neuropathy symptoms in the hands and feet. The feet are most bothersome. He describes a pins and needles sensation. The neuropathy does not interfere with activities with his hands. The pain at the upper back has improved. He now has pain at the low back, relieved with Tylenol. His appetite has improved.  Objective:  Vital signs in last 24 hours:  Blood pressure 119/77, pulse 95, temperature 98.4 F (36.9 C), temperature source Oral, resp. rate 19, height '6\' 1"'$  (1.854 m), weight 182 lb 14.4 oz (83 kg), SpO2 100 %.    HEENT: No thrush or ulcers Lymphatics: No cervical or supraclavicular nodes Resp: Decreased breath sounds at the left lower chest, no respiratory distress Cardio: Regular rate and rhythm GI: No hepatomegaly Vascular: No leg edema Neuro: Mild to moderate decrease in vibratory sense at the fingertips bilaterally  Musculoskeletal: No tenderness at the low back   Portacath/PICC-without erythema  Lab Results:  Lab Results  Component Value Date   WBC 6.4 11/09/2016   HGB 11.6 (L) 11/09/2016   HCT 36.3 (L) 11/09/2016   MCV 86.7 11/09/2016   PLT 183 11/09/2016   NEUTROABS 4.9 11/09/2016   09/28/2016: CEA-51.21   Medications: I have reviewed the patient's current medications.  Assessment/Plan: 1.Adenocarcinoma of the distal esophagus/gastric cardia-status post an endoscopic biopsy on 03/18/2013 confirming adenocarcinoma, HER-2/neunot amplified  -Staging CT scans 03/20/2013 confirmed a distal esophageal/gastric cardia mass, distal paraesophageal lymph nodes and paratracheal nodes concerning for metastatic lymphadenopathy  -A PET scan 03/27/2013 with a multifocal area of hypermetabolic activity involving the thoracic esophagus extending into the gastric  cardia and hypermetabolic mediastinal nodes, no distant metastatic disease  -Initiation of radiation 04/07/2013 and weekly Taxol/carboplatin 04/08/2013, radiation completed 05/15/2013  -Restaging PET scan 06/09/2013-no evidence of metastatic disease, no hypermetabolic lymphadenopathy  -Esophagogastrectomy 07/07/2013 revealed a ypT1b,ypN0 tumor with negative surgical margins  -CT neck 06/09/2014 revealed a necrotic lymph node in the right tracheoesophageal groove  -PET scan 11/15/2014 revealed a single focus of hypermetabolism corresponding to the necrotic right upper mediastinum lymph node -EUS biopsy of the paraesophageal lymph node 06/25/2012 confirmed metastatic adenocarcinoma  -Radiation completed 08/11/2014 -Rising CEA May 2016 -PET scan 07/13/2015 with hypermetabolic right paratracheal and right hilar adenopathy -Cycle 1 FOLFOX 08/03/2015 -Cycle 2 FOLFOX 08/17/2015 - cycle 3 FOLFOX 08/31/2015 (oxaliplatin dose reduced secondary to thrombocytopenia)  -Cycle 4 FOLFOX 09/14/2015 -Cycle 5 FOLFOX 09/28/2015 - restaging CTs 09/30/2015 with stable disease involving neck/chest lymph nodes, a slight increase in size of left upper lobe lung nodule  - cycle 6 FOLFOX 10/12/2015  -FOLFOX discontinued secondary to development of bilateral vocal cord paralysis November 2016 - CT scan of the neck and chest on 11/22/15 revealed no definite disease in the neck. At the thoracic inlet region, there is questionably slight increase in prominence of soft tissue at the right tracheoesophageal groove. This could be an enlarging node or mass but could also relate to the esophagectomy and pull-through surgery.Enlarging lingular and left lower lobe nodules are worrisome for metastatic disease. Peribronchovascular nodular consolidation in the anterior right lower lobe, slightly more prominent, indeterminate. Cholelithiasis. Left diaphragmatic hernia, stable. -Xeloda 1 week on/1 week off beginning  12/11/2015 -Restaging CTs of the neck and chest 02/07/2016 revealed enlargement of 2 left-sided lung nodules, no other evidence of disease progression -Cycle 1 Pembrolizumab 02/25/2016  -  Cycle 2 Pembrolizumab 03/17/2016 -Cycle 3 Pembrolizumab 04/07/2016   -CT 04/20/2016 consistent with progressive disease involving a large pericardial effusion, bilateral pleural effusions, enlarged mediastinal lymph nodes and lung nodules  Cytology from the pericardial fluid 04/21/2016-positive for metastatic adenocarcinoma -CT 05/01/2016 was slight worsening of right hilar lymphadenopathy with near complete collapse of the right middle lobe, nodular metastases in the left lung, stable bilateral pleural effusions  Cycle 1 Taxol/ramucirumab 05/12/2016; day 8 Taxol 05/19/2016; day 15 Taxol/ramucirumab 05/26/2016.  Cycle 2 day 1 Taxol/ramucirumab 06/22/2016  Cycle 2 day 8 Taxol 06/29/2016   Cycle 2 day 15 Taxol/ramucirumab07/28/2017  Cycle 3 day 1 Taxol/ramucirumab 07/20/2016  Cycle 3 day 8 Taxol 07/27/2016  Cycle 3 day 15 Taxol/ramucirumab08/24/2017  Restaging chest CT 08/10/2016-left upper lobe pulmonary nodule similar; left lower lobe pulmonary nodule may have enlarged minimally; improved appearance of the mediastinum and right infrahilar region with decreased soft tissue fullness; improvement in probable left infrahilar adenopathy; decreased left and resolved right pleural effusions; developing right adrenal nodularity; T7 vertebral body lesion suspicious for osseous metastasis.  Cycle 4 Taxol/ramucirumab09/06/2016   cycle 5 Taxol/ramucirumab10/03/2016  restaging chest CT 10/05/2016 with slight enlargement of mediastinal lymph nodes and pulmonary nodules, enlargement of left adrenal lesion,? Lymphatic tumor spread in the right lung  Cycle 6 Taxol/ ramucirumab11/01/2016; Taxol placed on hold beginning 10/26/2016 due to neuropathy symptoms, continuation of ramucirumab alone  Cycle  7Taxol/ ramucirumab 11/09/2016-Taxol remains on hold  2. Indeterminate 15 mm right liver lesion on the CT scan 03/20/2013, MRI of the liver 06/09/2013 confirmed a right liver hemangioma  3. Anorexia/weight loss and solid dysphagia secondary to #1 -improved 4. History of a colon polyp, status post a polypectomy October 2013  5. history of Thrombocytopenia secondary chemotherapy-the carboplatin wase dose reduced with week 5 of chemotherapy chemotherapy  6. hoarseness secondary to right vocal cord paralysis-CT/PET scan imaging consistent with a malignant right paratracheal lymph node causing the vocal cord paralysis  -Status post a laryngeal plasty at [facility] on 09/02/2014 7. Esophageal stricture-status post dilation 06/08/2015 -Food impaction removal 09/29/2015 -Esophageal stricture dilation 10/06/2015 8. Port-A-Cath placement 07/20/2015 9. Delayed nausea following cycle 1 FOLFOX. Aloxi and Emend added with cycle 2. 10. Thrombocytopenia secondary to chemotherapy  11. Emotional lability/depression-prophylactic Decadron discontinued; Remeron initiated 12. Progressive hoarseness November 2016, diagnosed with bilateral vocal cord paralysis, status post a tracheostomy and bilateral injection laryngoplasty at [facility] on 10/27/2015 13. Pericardial window 04/21/2016  14. High fever 07/06/2016-chest x-ray suggestive of possible left lower lobe pneumonia-treated with Augmentin  15. 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 Taxol placed on hold beginning 10/26/2016.   Disposition:  His overall status appears unchanged. He continues to have neuropathy symptoms in the hands and feet. The Taxol will remain on hold. He will continue every 2 week ramuciurmab. He will contact us if the back pain worsens.  Dr.Lorson will return for an office visit and ramucirumab on 11/23/2016.   Betsy Coder, MD  11/09/2016  10:38 AM

## 2016-11-09 NOTE — Patient Instructions (Signed)
[facility] Discharge Instructions for Patients Receiving Chemotherapy  Today you received the following chemotherapy agents Cyramza.  To help prevent nausea and vomiting after your treatment, we encourage you to take your nausea medication.  If you develop nausea and vomiting that is not controlled by your nausea medication, call the clinic.   BELOW ARE SYMPTOMS THAT SHOULD BE REPORTED IMMEDIATELY:  *FEVER GREATER THAN 100.5 F  *CHILLS WITH OR WITHOUT FEVER  NAUSEA AND VOMITING THAT IS NOT CONTROLLED WITH YOUR NAUSEA MEDICATION  *UNUSUAL SHORTNESS OF BREATH  *UNUSUAL BRUISING OR BLEEDING  TENDERNESS IN MOUTH AND THROAT WITH OR WITHOUT PRESENCE OF ULCERS  *URINARY PROBLEMS  *BOWEL PROBLEMS  UNUSUAL RASH Items with * indicate a potential emergency and should be followed up as soon as possible.  Feel free to call the clinic you have any questions or concerns. The clinic phone number is [phone].  Please show the [place] at check-in to the Emergency Department and triage nurse.

## 2016-11-09 NOTE — Patient Instructions (Signed)

## 2016-11-09 NOTE — Telephone Encounter (Signed)
Appointments scheduled per 11/30 LOS. Patient given AVS report and calendars with future scheduled appointments. °

## 2016-11-10 ENCOUNTER — Other Ambulatory Visit: Payer: Self-pay | Admitting: *Deleted

## 2016-11-10 ENCOUNTER — Telehealth: Payer: Self-pay | Admitting: *Deleted

## 2016-11-10 ENCOUNTER — Other Ambulatory Visit: Payer: Self-pay | Admitting: Nurse Practitioner

## 2016-11-10 DIAGNOSIS — C153 Malignant neoplasm of upper third of esophagus: Secondary | ICD-10-CM

## 2016-11-10 MED ORDER — ZOLPIDEM TARTRATE 5 MG PO TABS: 5.0000 mg | ORAL_TABLET | Freq: Every evening | ORAL | 0 refills | Status: DC | PRN

## 2016-11-10 MED FILL — ZOLPIDEM TARTRATE 5 MG TAB: 5 | 30 days supply | Qty: 30 | Fill #0

## 2016-11-10 NOTE — Telephone Encounter (Signed)
Per LOS I have scheduled appt and notified the scheduler

## 2016-11-10 NOTE — Telephone Encounter (Signed)
Call received from patient concerned about his elevated cea and would like to know if he should be seen by Dr. Benay Spice next week and is also requesting a refill on Ambien and Flexeril.  Per Dr. Benay Spice, [place] for refill on Ambien and Flexeril, patient does not need to be seen next week, cea will be checked at next appt and if still elevated will potentially add Taxol back to patients treatment plan.  Call placed back to patient to inform him of MD orders and pt has no further questions at this time.  Patient appreciative of call back.

## 2016-11-17 ENCOUNTER — Telehealth: Payer: Self-pay | Admitting: *Deleted

## 2016-11-17 NOTE — Telephone Encounter (Signed)
Message from pt reporting dysuria and cough with green sputum. Was able to reach pt after 2 attempts. He reports no change in the intermittent dysuria. Denies fever. Pt reports greenish sputum has been on-going as well. Denies dyspnea. Instructed him to call on-call MD or go to ED if he develops fever or dysnpea. He agreed to do so.  Pt also had questions about whether he needed tetanus vaccination; he received a MyChart notification that he does. Instructed him to discuss with MD during visit next week. He agreed to do so. Dr. Benay Spice aware of above. No further orders.

## 2016-11-21 ENCOUNTER — Telehealth: Payer: Self-pay | Admitting: *Deleted

## 2016-11-21 DIAGNOSIS — C153 Malignant neoplasm of upper third of esophagus: Secondary | ICD-10-CM

## 2016-11-21 MED ORDER — MIRTAZAPINE 15 MG PO TABS: 15.0000 mg | ORAL_TABLET | Freq: Every day | ORAL | 1 refills | Status: DC

## 2016-11-21 MED ORDER — CYCLOBENZAPRINE HCL 10 MG PO TABS: 10.0000 mg | ORAL_TABLET | Freq: Three times a day (TID) | ORAL | 0 refills | Status: DC | PRN

## 2016-11-21 MED ORDER — HYDROCODONE-ACETAMINOPHEN 7.5-325 MG/15ML PO SOLN: 10.0000 mL | Freq: Four times a day (QID) | ORAL | 0 refills | Status: DC | PRN

## 2016-11-21 MED ORDER — ALPRAZOLAM 0.5 MG PO TABS: 0.5000 mg | ORAL_TABLET | Freq: Three times a day (TID) | ORAL | 0 refills | Status: DC | PRN

## 2016-11-21 MED ORDER — ZOLPIDEM TARTRATE 5 MG PO TABS: 5.0000 mg | ORAL_TABLET | Freq: Every evening | ORAL | 0 refills | Status: DC | PRN

## 2016-11-21 MED FILL — HYDROCOD-APAP 7.5-325/15ML: 7.5-325 | 6 days supply | Qty: 240 | Fill #0

## 2016-11-21 MED FILL — CYCLOBENZAPRINE 10 MG TAB: 10 | 10 days supply | Qty: 30 | Fill #0

## 2016-11-21 MED FILL — ALPRAZolam 0.5 MG TABS: 0.5 | 20 days supply | Qty: 60 | Fill #0

## 2016-11-21 NOTE — Telephone Encounter (Signed)
Pt came to office to request refill on Hydrocodone (resumed taking this for lower back pain.) Rates pain at 5-7/10 before taking Hydrocodone. Pt also requesting refills on Alprazolam, Cyclobenzaprine, Mirtazapine and Ambien.  Discussed with Dr. Benay Spice: OK to refill.  Pt instructed not to drive while taking narcotics, he voiced understanding. Next appointment confirmed.

## 2016-11-23 ENCOUNTER — Other Ambulatory Visit (HOSPITAL_BASED_OUTPATIENT_CLINIC_OR_DEPARTMENT_OTHER): Payer: BLUE CROSS/BLUE SHIELD

## 2016-11-23 ENCOUNTER — Ambulatory Visit (HOSPITAL_BASED_OUTPATIENT_CLINIC_OR_DEPARTMENT_OTHER): Payer: BLUE CROSS/BLUE SHIELD | Admitting: Oncology

## 2016-11-23 ENCOUNTER — Ambulatory Visit (HOSPITAL_BASED_OUTPATIENT_CLINIC_OR_DEPARTMENT_OTHER): Payer: BLUE CROSS/BLUE SHIELD

## 2016-11-23 ENCOUNTER — Ambulatory Visit: Payer: BLUE CROSS/BLUE SHIELD

## 2016-11-23 VITALS — BP 129/98 | HR 104 | Temp 98.9°F | Resp 18 | Wt 182.2 lb

## 2016-11-23 DIAGNOSIS — G893 Neoplasm related pain (acute) (chronic): Secondary | ICD-10-CM

## 2016-11-23 DIAGNOSIS — C153 Malignant neoplasm of upper third of esophagus: Secondary | ICD-10-CM

## 2016-11-23 DIAGNOSIS — Z5112 Encounter for antineoplastic immunotherapy: Secondary | ICD-10-CM | POA: Diagnosis not present

## 2016-11-23 DIAGNOSIS — C155 Malignant neoplasm of lower third of esophagus: Secondary | ICD-10-CM | POA: Diagnosis not present

## 2016-11-23 DIAGNOSIS — F329 Major depressive disorder, single episode, unspecified: Secondary | ICD-10-CM

## 2016-11-23 DIAGNOSIS — G620 Drug-induced polyneuropathy: Secondary | ICD-10-CM | POA: Diagnosis not present

## 2016-11-23 DIAGNOSIS — R634 Abnormal weight loss: Secondary | ICD-10-CM | POA: Diagnosis not present

## 2016-11-23 DIAGNOSIS — Z95828 Presence of other vascular implants and grafts: Secondary | ICD-10-CM

## 2016-11-23 LAB — COMPREHENSIVE METABOLIC PANEL
ALT: 7 U/L (ref 0–55)
ANION GAP: 10 meq/L (ref 3–11)
AST: 11 U/L (ref 5–34)
Albumin: 3.1 g/dL — ABNORMAL LOW (ref 3.5–5.0)
Alkaline Phosphatase: 99 U/L (ref 40–150)
BUN: 8.2 mg/dL (ref 7.0–26.0)
CHLORIDE: 107 meq/L (ref 98–109)
CO2: 25 meq/L (ref 22–29)
CREATININE: 0.7 mg/dL (ref 0.7–1.3)
Calcium: 9.1 mg/dL (ref 8.4–10.4)
EGFR: >90 mL/min/{1.73_m2} (ref >90)
Glucose: 97 mg/dl (ref 70–140)
Potassium: 3.9 mEq/L (ref 3.5–5.1)
Sodium: 141 mEq/L (ref 136–145)
Total Bilirubin: 0.53 mg/dL (ref 0.20–1.20)
Total Protein: 6.8 g/dL (ref 6.4–8.3)

## 2016-11-23 LAB — CBC WITH DIFFERENTIAL/PLATELET
BASO%: 0.9 % (ref 0.0–2.0)
Basophils Absolute: 0.1 10*3/uL (ref 0.0–0.1)
EOS%: 7.5 % — AB (ref 0.0–7.0)
Eosinophils Absolute: 0.5 10*3/uL (ref 0.0–0.5)
HEMATOCRIT: 35.6 % — AB (ref 38.4–49.9)
HGB: 11.3 g/dL — ABNORMAL LOW (ref 13.0–17.1)
LYMPH#: 0.7 10*3/uL — AB (ref 0.9–3.3)
LYMPH%: 9.8 % — ABNORMAL LOW (ref 14.0–49.0)
MCH: 27.4 pg (ref 27.2–33.4)
MCHC: 31.8 g/dL — AB (ref 32.0–36.0)
MCV: 86.3 fL (ref 79.3–98.0)
MONO#: 0.6 10*3/uL (ref 0.1–0.9)
MONO%: 9.2 % (ref 0.0–14.0)
NEUT#: 4.8 10*3/uL (ref 1.5–6.5)
NEUT%: 72.6 % (ref 39.0–75.0)
PLATELETS: 147 10*3/uL (ref 140–400)
RBC: 4.12 10*6/uL — AB (ref 4.20–5.82)
RDW: 18.2 % — ABNORMAL HIGH (ref 11.0–14.6)
WBC: 6.7 10*3/uL (ref 4.0–10.3)

## 2016-11-23 LAB — CEA (IN HOUSE-CHCC): CEA (CHCC-In House): 118.45 ng/mL — ABNORMAL HIGH (ref 0.00–5.00)

## 2016-11-23 MED ORDER — SODIUM CHLORIDE 0.9 % IV SOLN
8.0000 mg/kg | Freq: Once | INTRAVENOUS | Status: AC
  Administered 2016-11-23: 600 mg via INTRAVENOUS
  Filled 2016-11-23: qty 50

## 2016-11-23 MED ORDER — SODIUM CHLORIDE 0.9 % IJ SOLN
10.0000 mL | INTRAMUSCULAR | Status: DC | PRN
  Administered 2016-11-23: 10 mL via INTRAVENOUS
  Filled 2016-11-23: qty 10

## 2016-11-23 MED ORDER — SODIUM CHLORIDE 0.9 % IV SOLN
Freq: Once | INTRAVENOUS | Status: AC
  Administered 2016-11-23: 10:00:00 via INTRAVENOUS

## 2016-11-23 MED ORDER — PREGABALIN 50 MG PO CAPS: 50.0000 mg | ORAL_CAPSULE | Freq: Two times a day (BID) | ORAL | 0 refills | Status: DC

## 2016-11-23 MED ORDER — BENZONATATE 100 MG PO CAPS: 100.0000 mg | ORAL_CAPSULE | Freq: Three times a day (TID) | ORAL | 1 refills | Status: DC | PRN

## 2016-11-23 MED ORDER — ACETAMINOPHEN 325 MG PO TABS
ORAL_TABLET | ORAL | Status: AC
  Filled 2016-11-23: qty 2

## 2016-11-23 MED ORDER — HEPARIN SOD (PORK) LOCK FLUSH 100 UNIT/ML IV SOLN
500.0000 [IU] | Freq: Once | INTRAVENOUS | Status: AC | PRN
  Administered 2016-11-23: 500 [IU]
  Filled 2016-11-23: qty 5

## 2016-11-23 MED ORDER — ACETAMINOPHEN 325 MG PO TABS
650.0000 mg | ORAL_TABLET | Freq: Once | ORAL | Status: AC
  Administered 2016-11-23: 650 mg via ORAL

## 2016-11-23 MED ORDER — SODIUM CHLORIDE 0.9% FLUSH
10.0000 mL | INTRAVENOUS | Status: DC | PRN
  Administered 2016-11-23: 10 mL
  Filled 2016-11-23: qty 10

## 2016-11-23 MED ORDER — MIRTAZAPINE 30 MG PO TABS: 30.0000 mg | ORAL_TABLET | Freq: Every day | ORAL | 0 refills | Status: DC

## 2016-11-23 MED FILL — BENZONATATE 100 MG CAPSULE: 100 | 30 days supply | Qty: 90 | Fill #0

## 2016-11-23 MED FILL — LYRICA 50 MG CAPSULE: 50 | 30 days supply | Qty: 60 | Fill #0

## 2016-11-23 MED FILL — MIRTAZAPINE 30 MG TABLET: 30 | 30 days supply | Qty: 30 | Fill #0

## 2016-11-23 NOTE — Progress Notes (Signed)
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 He has discomfort in the low back. The pain is relieved with hydrocodone. HEENT is not constant. The left upper back pain has improved. He has malaise. No dyspnea. He complains of feeling emotional. The neuropathy symptoms in the hands and feet persist and is worse in the cold. He reports a stinging discomfort.    Objective:  Vital signs in last 24 hours:  Blood pressure (!) 129/98, pulse (!) 104, temperature 98.9 F (37.2 C), temperature source Oral, resp. rate 18, weight 182 lb 3.2 oz (82.6 kg), SpO2 98 %.    HEENT: No thrush Lymphatics: No cervical or supraclavicular nodes Resp: Lungs clear bilaterally Cardio: Regular rate and rhythm GI: No hepatomegaly, nontender Vascular: No leg edema  Portacath/PICC-without erythema  Lab Results:  Lab Results  Component Value Date   WBC 6.7 11/23/2016   HGB 11.3 (L) 11/23/2016   HCT 35.6 (L) 11/23/2016   MCV 86.3 11/23/2016   PLT 147 11/23/2016   NEUTROABS 4.8 11/23/2016    Lab Results  Component Value Date   NA 139 11/09/2016    Lab Results  Component Value Date   CEA1 148.2 (H) 08/03/2016    Imaging:  No results found.  Medications: I have reviewed the patient's current medications.  Assessment/Plan: 1.Adenocarcinoma of the distal esophagus/gastric cardia-status post an endoscopic biopsy on 03/18/2013 confirming adenocarcinoma, HER-2/neunot amplified  -Staging CT scans 03/20/2013 confirmed a distal esophageal/gastric cardia mass, distal paraesophageal lymph nodes and paratracheal nodes concerning for metastatic lymphadenopathy  -A PET scan 03/27/2013 with a multifocal area of hypermetabolic activity involving the thoracic esophagus extending into the gastric cardia and hypermetabolic mediastinal nodes, no distant  metastatic disease  -Initiation of radiation 04/07/2013 and weekly Taxol/carboplatin 04/08/2013, radiation completed 05/15/2013  -Restaging PET scan 06/09/2013-no evidence of metastatic disease, no hypermetabolic lymphadenopathy  -Esophagogastrectomy 07/07/2013 revealed a ypT1b,ypN0 tumor with negative surgical margins  -CT neck 06/09/2014 revealed a necrotic lymph node in the right tracheoesophageal groove  -PET scan 11/15/2014 revealed a single focus of hypermetabolism corresponding to the necrotic right upper mediastinum lymph node -EUS biopsy of the paraesophageal lymph node 06/25/2012 confirmed metastatic adenocarcinoma  -Radiation completed 08/11/2014 -Rising CEA May 2016 -PET scan 07/13/2015 with hypermetabolic right paratracheal and right hilar adenopathy -Cycle 1 FOLFOX 08/03/2015 -Cycle 2 FOLFOX 08/17/2015 - cycle 3 FOLFOX 08/31/2015 (oxaliplatin dose reduced secondary to thrombocytopenia)  -Cycle 4 FOLFOX 09/14/2015 -Cycle 5 FOLFOX 09/28/2015 - restaging CTs 09/30/2015 with stable disease involving neck/chest lymph nodes, a slight increase in size of left upper lobe lung nodule  - cycle 6 FOLFOX 10/12/2015  -FOLFOX discontinued secondary to development of bilateral vocal cord paralysis November 2016 - CT scan of the neck and chest on 11/22/15 revealed no definite disease in the neck. At the thoracic inlet region, there is questionably slight increase in prominence of soft tissue at the right tracheoesophageal groove. This could be an enlarging node or mass but could also relate to the esophagectomy and pull-through surgery.Enlarging lingular and left lower lobe nodules are worrisome for metastatic disease. Peribronchovascular nodular consolidation in the anterior right lower lobe, slightly more prominent, indeterminate. Cholelithiasis. Left diaphragmatic hernia, stable. -Xeloda 1 week on/1 week off beginning 12/11/2015 -Restaging CTs of the neck and chest 02/07/2016 revealed  enlargement of 2 left-sided lung nodules, no other evidence of disease progression -Cycle 1 Pembrolizumab 02/25/2016  -Cycle  2 Pembrolizumab 03/17/2016 -Cycle 3 Pembrolizumab 04/07/2016   -CT 04/20/2016 consistent with progressive disease involving a large pericardial effusion, bilateral pleural effusions, enlarged mediastinal lymph nodes and lung nodules  Cytology from the pericardial fluid 04/21/2016-positive for metastatic adenocarcinoma -CT 05/01/2016 was slight worsening of right hilar lymphadenopathy with near complete collapse of the right middle lobe, nodular metastases in the left lung, stable bilateral pleural effusions  Cycle 1 Taxol/ramucirumab 05/12/2016; day 8 Taxol 05/19/2016; day 15 Taxol/ramucirumab 05/26/2016.  Cycle 2 day 1 Taxol/ramucirumab 06/22/2016  Cycle 2 day 8 Taxol 06/29/2016   Cycle 2 day 15 Taxol/ramucirumab07/28/2017  Cycle 3 day 1 Taxol/ramucirumab 07/20/2016  Cycle 3 day 8 Taxol 07/27/2016  Cycle 3 day 15 Taxol/ramucirumab08/24/2017  Restaging chest CT 08/10/2016-left upper lobe pulmonary nodule similar; left lower lobe pulmonary nodule may have enlarged minimally; improved appearance of the mediastinum and right infrahilar region with decreased soft tissue fullness; improvement in probable left infrahilar adenopathy; decreased left and resolved right pleural effusions; developing right adrenal nodularity; T7 vertebral body lesion suspicious for osseous metastasis.  Cycle 4 Taxol/ramucirumab09/06/2016   cycle 5 Taxol/ramucirumab10/03/2016  restaging chest CT 10/05/2016 with slight enlargement of mediastinal lymph nodes and pulmonary nodules, enlargement of left adrenal lesion,? Lymphatic tumor spread in the right lung  Cycle 6 Taxol/ ramucirumab11/01/2016; Taxol placed on hold beginning 10/26/2016 due to neuropathy symptoms, continuation of ramucirumab alone  Cycle 7Taxol/ ramucirumab 11/09/2016-Taxol remains on hold  2. Indeterminate  15 mm right liver lesion on the CT scan 03/20/2013, MRI of the liver 06/09/2013 confirmed a right liver hemangioma  3. Anorexia/weight loss and solid dysphagia secondary to #1 -improved 4. History of a colon polyp, status post a polypectomy October 2013  5. history of Thrombocytopenia secondary chemotherapy-the carboplatin wase dose reduced with week 5 of chemotherapy chemotherapy  6. hoarseness secondary to right vocal cord paralysis-CT/PET scan imaging consistent with a malignant right paratracheal lymph node causing the vocal cord paralysis  -Status post a laryngeal plasty at [facility] on 09/02/2014 7. Esophageal stricture-status post dilation 06/08/2015 -Food impaction removal 09/29/2015 -Esophageal stricture dilation 10/06/2015 8. Port-A-Cath placement 07/20/2015 9. Delayed nausea following cycle 1 FOLFOX. Aloxi and Emend added with cycle 2. 10. Thrombocytopenia secondary to chemotherapy  11. Emotional lability/depression-prophylactic Decadron discontinued; Remeron initiated 12. Progressive hoarseness November 2016, diagnosed with bilateral vocal cord paralysis, status post a tracheostomy and bilateral injection laryngoplasty at [facility] on 10/27/2015 13. Pericardial window 04/21/2016  14. High fever 07/06/2016-chest x-ray suggestive of possible left lower lobe pneumonia-treated with Augmentin  15. 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 Taxol placed on hold beginning 10/26/2016. Lyrica added 11/23/2016 16. 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 He will complete another treatment ramucirumab today. We added Lyrica for the painful peripheral neuropathy. He will increase the dose of Remeron for depression.  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 Taxol remains on hold secondary to neuropathy.   11/23/2016  8:51 AM

## 2016-11-23 NOTE — Patient Instructions (Signed)
[facility] Discharge Instructions for Patients Receiving Chemotherapy  Today you received the following chemotherapy agents:  Cyramza  To help prevent nausea and vomiting after your treatment, we encourage you to take your nausea medication as ordered per MD.   If you develop nausea and vomiting that is not controlled by your nausea medication, call the clinic.   BELOW ARE SYMPTOMS THAT SHOULD BE REPORTED IMMEDIATELY:  *FEVER GREATER THAN 100.5 F  *CHILLS WITH OR WITHOUT FEVER  NAUSEA AND VOMITING THAT IS NOT CONTROLLED WITH YOUR NAUSEA MEDICATION  *UNUSUAL SHORTNESS OF BREATH  *UNUSUAL BRUISING OR BLEEDING  TENDERNESS IN MOUTH AND THROAT WITH OR WITHOUT PRESENCE OF ULCERS  *URINARY PROBLEMS  *BOWEL PROBLEMS  UNUSUAL RASH Items with * indicate a potential emergency and should be followed up as soon as possible.  Feel free to call the clinic you have any questions or concerns. The clinic phone number is [phone].  Please show the [facility] at check-in to the Emergency Department and triage nurse.

## 2016-11-28 ENCOUNTER — Emergency Department (HOSPITAL_COMMUNITY): Payer: BLUE CROSS/BLUE SHIELD

## 2016-11-28 ENCOUNTER — Encounter (HOSPITAL_COMMUNITY): Payer: Self-pay

## 2016-11-28 ENCOUNTER — Telehealth: Payer: Self-pay | Admitting: *Deleted

## 2016-11-28 ENCOUNTER — Emergency Department (HOSPITAL_COMMUNITY)
Admission: EM | Admit: 2016-11-28 | Discharge: 2016-11-28 | Disposition: A | Discharge status: Home / Self Care | Payer: BLUE CROSS/BLUE SHIELD | Attending: Emergency Medicine | Admitting: Emergency Medicine

## 2016-11-28 DIAGNOSIS — R935 Abnormal findings on diagnostic imaging of other abdominal regions, including retroperitoneum: Secondary | ICD-10-CM | POA: Insufficient documentation

## 2016-11-28 DIAGNOSIS — Z8501 Personal history of malignant neoplasm of esophagus: Secondary | ICD-10-CM | POA: Insufficient documentation

## 2016-11-28 DIAGNOSIS — M545 Low back pain: Secondary | ICD-10-CM | POA: Diagnosis not present

## 2016-11-28 MED ORDER — SODIUM CHLORIDE 0.9% FLUSH
10.0000 mL | Freq: Once | INTRAVENOUS | Status: AC
  Administered 2016-11-28: 10 mL via INTRAVENOUS

## 2016-11-28 MED ORDER — HYDROMORPHONE HCL 2 MG/ML IJ SOLN
1.0000 mg | Freq: Once | INTRAMUSCULAR | Status: AC
  Administered 2016-11-28: 1 mg via INTRAVENOUS
  Filled 2016-11-28: qty 1

## 2016-11-28 MED ORDER — SODIUM CHLORIDE 0.9 % IV SOLN
INTRAVENOUS | Status: DC
  Administered 2016-11-28: 125 mL/h via INTRAVENOUS

## 2016-11-28 MED ORDER — HEPARIN SOD (PORK) LOCK FLUSH 100 UNIT/ML IV SOLN
500.0000 [IU] | Freq: Once | INTRAVENOUS | Status: AC
  Administered 2016-11-28: 500 [IU]

## 2016-11-28 MED ORDER — HEPARIN SOD (PORK) LOCK FLUSH 100 UNIT/ML IV SOLN
INTRAVENOUS | Status: AC
  Administered 2016-11-28: 500 [IU]
  Filled 2016-11-28: qty 5

## 2016-11-28 MED ORDER — HYDROMORPHONE HCL 2 MG PO TABS: 2.0000 mg | ORAL_TABLET | Freq: Four times a day (QID) | ORAL | 0 refills | Status: DC | PRN

## 2016-11-28 MED ORDER — HYDROMORPHONE HCL 1 MG/ML PO LIQD: 2.0000 mg | Freq: Four times a day (QID) | ORAL | 0 refills | Status: AC | PRN

## 2016-11-28 NOTE — ED Notes (Signed)
Per Dr Gearldine Shown office-patient in severe back pain-states history of esophogeal cancer-possible metz

## 2016-11-28 NOTE — ED Triage Notes (Addendum)
Patient c/o low back pain x 3 weeks that is progressively getting worse. Patient states he took hydrocodone 1 1/2 hours prior to coming to the ED.

## 2016-11-28 NOTE — ED Notes (Signed)
Patient denies pain and is resting comfortably.  

## 2016-11-28 NOTE — Telephone Encounter (Signed)
Message received from patient stating that he is having increased back pain. Call placed immediately back to patient.  Patient having difficulty speaking and states that his pain is not controlled. Call placed on speaker phone and pt.'s wife states that pt is having increased pain to lower back which is not controlled with prescribed Hydrocodone.  Patient's wife states that pt is refusing to go to the ED at this time.  She states that pt is having no nausea, is having difficulty ambulating d/t back pain and is having no difficulties with his bowels or bladder.  L. Alexiz NP notified of patient's complaints and spoke with Dr. Burr Medico who recommends that pt go to the ED immediately.  Patient instructed to go to the ED per Dr. Burr Medico and he verbalized that he will go.

## 2016-11-28 NOTE — ED Provider Notes (Signed)
[facility] DEPT Provider Note   CSN: [id number] Arrival date & time: 11/28/16  1539     History   Chief Complaint Chief Complaint  Patient presents with  . 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  HPI Pt started having low back pain a few weeks ago.  The pain is in the lower back and radiates down toward the front of the hip.    He has a history of esophageal CA and is getting chemotherapy treatments.  He mentioned it to his oncologist but has not had any specific evaluation of the pain. He has been taking hydrocodone but the pain increased. No trouble urinating.  No numbness in the leg.  No abdominal pain.  No dysuria.  No fever.   No rash  Past Medical History:  Diagnosis Date  . Anxiety   . Colon polyp   . Diverticulosis   . Esophageal cancer ([place]) 03/18/2013   Adenocarcinoma  . GERD (gastroesophageal reflux disease)   . History of radiation therapy 04/07/13-05-15-13   Esopageal ca,50.4Gy/54f and again 2015 neck area at dLupus  . Hyperlipemia   . Pre-diabetes   . Shortness of breath dyspnea   . Sleep apnea    no c-pap at this time    Patient Active Problem List   Diagnosis Date Noted  . Shoulder pain, left 10/06/2016  . Dysphagia   . Esophageal stricture   . Port catheter in place 05/12/2016  . Chest pain 05/02/2016  . Dyspnea 05/02/2016  . Paroxysmal atrial fibrillation ([facility] 05/02/2016  . Mass in chest 05/02/2016  . Malignant neoplasm of esophagus ([place]   . Knee pain   . Pain   . Atrial fibrillation, new onset ([facility] 04/25/2016  . Elevated liver enzymes   . Encounter for chest tube removal   . Acute respiratory failure with hypoxia ([facility] 04/21/2016  . Pericardial effusion with cardiac tamponade 04/21/2016  . Leukocytosis 04/21/2016  . Anemia of chronic disease 04/21/2016  . Hilar adenopathy 04/21/2016  . Lymphatic obstruction 04/21/2016  . HCAP (healthcare-associated pneumonia) 04/20/2016  . Cancer of distal third of esophagus ([facility]  03/28/2013    Past Surgical History:  Procedure Laterality Date  . APPENDECTOMY    . BALLOON DILATION N/A 06/05/2016   Procedure: BALLOON DILATION;  Surgeon: JIrene Shipper MD;  [facility]  Service: Endoscopy;  Laterality: N/A;  . COLONOSCOPY    . COMPLETE ESOPHAGECTOMY N/A 07/07/2013   Procedure: ESOPHAGECTOMY COMPLETE;  Surgeon: EGrace Isaac MD;  [facility] [facility]  Service: Thoracic;  Laterality: N/A;  transhiatel esophagectomy, feeding jejunostomy  . EDG  03/18/2013   Esophageal Mass  . ESOPHAGOGASTRODUODENOSCOPY N/A 07/02/2014   Procedure: ESOPHAGOGASTRODUODENOSCOPY (EGD);  Surgeon: CGatha Mayer MD;  [facility];  Service: Endoscopy;  Laterality: N/A;  . ESOPHAGOGASTRODUODENOSCOPY N/A 02/16/2015   Procedure: ESOPHAGOGASTRODUODENOSCOPY (EGD);  Surgeon: DLafayette Dragon MD;  [facility];  Service: Endoscopy;  Laterality: N/A;  . ESOPHAGOGASTRODUODENOSCOPY N/A 09/29/2015   Procedure: ESOPHAGOGASTRODUODENOSCOPY (EGD);  Surgeon: MLadene Artist MD;  [facility];  Service: Endoscopy;  Laterality: N/A;  . ESOPHAGOGASTRODUODENOSCOPY N/A 06/05/2016   Procedure: ESOPHAGOGASTRODUODENOSCOPY (EGD);  Surgeon: JIrene Shipper MD;  [facility];  Service: Endoscopy;  Laterality: N/A;  . EUS N/A 06/25/2014   Procedure: UPPER ENDOSCOPIC ULTRASOUND (EUS) LINEAR;  Surgeon: DMilus Banister MD;  [facility];  Service: Endoscopy;  Laterality: N/A;  . LIPOMA EXCISION    .  SUBXYPHOID PERICARDIAL WINDOW N/A 04/21/2016   Procedure: SUBXYPHOID PERICARDIAL WINDOW;  Surgeon: Melrose Nakayama, MD;  Location: [facility];  Service: Thoracic;  Laterality: N/A;  . UPPER GASTROINTESTINAL ENDOSCOPY    . VIDEO BRONCHOSCOPY N/A 07/07/2013   Procedure: VIDEO BRONCHOSCOPY;  Surgeon: Grace Isaac, MD;  Location[facility];  Service: Thoracic;  Laterality: N/A;  . vocal chord surgery     pushed right vocal cord more centered- September 2015    OB History    No  data available       Home Medications    Prior to Admission medications   Medication Sig Start Date End Date Taking? Authorizing Provider  acetaminophen (TYLENOL) 500 MG tablet Take 1,000 mg by mouth every 6 (six) hours as needed for mild pain or moderate pain.   Yes Historical Provider, MD  ALPRAZolam Duanne Moron) 0.5 MG tablet Take 1 tablet (0.5 mg total) by mouth 3 (three) times daily as needed for anxiety. 11/21/16  Yes Ladell Pier, MD  Ascorbic Acid (VITAMIN C) 1000 MG tablet Take 1,000 mg by mouth 3 (three) times daily.   Yes Historical Provider, MD  benzonatate (TESSALON) 100 MG capsule Take 1 capsule (100 mg total) by mouth 3 (three) times daily as needed for cough. 11/23/16  Yes Ladell Pier, MD  cholecalciferol (VITAMIN D) 1000 units tablet Take 1,000 Units by mouth daily.   Yes Historical Provider, MD  cyclobenzaprine (FLEXERIL) 10 MG tablet Take 1 tablet (10 mg total) by mouth 3 (three) times daily as needed for muscle spasms. 11/21/16  Yes Ladell Pier, MD  HYDROcodone-acetaminophen (HYCET) 7.5-325 mg/15 ml solution Take 10 mLs by mouth 4 (four) times daily as needed for moderate pain. 11/21/16  Yes Ladell Pier, MD  ibuprofen (ADVIL,MOTRIN) 200 MG tablet Take 400 mg by mouth every 8 (eight) hours as needed for moderate pain. Reported on 05/12/2016   Yes Historical Provider, MD  lactase (LACTAID) 3000 UNITS tablet Take 3,000 Units by mouth daily as needed (lactose intolerance). Reported on 05/12/2016   Yes Historical Provider, MD  lidocaine-prilocaine (EMLA) cream Apply 1 application topically as needed. Apply tablespoon to PAC 2 hours prior to stick and cover with plastic wrap 11/09/15  Yes Ladell Pier, MD  mirtazapine (REMERON) 30 MG tablet Take 1 tablet (30 mg total) by mouth at bedtime. 11/23/16  Yes Ladell Pier, MD  pregabalin (LYRICA) 50 MG capsule Take 1 capsule (50 mg total) by mouth 2 (two) times daily. 11/23/16  Yes Ladell Pier, MD  zolpidem (AMBIEN) 5 MG  tablet Take 1 tablet (5 mg total) by mouth at bedtime as needed for sleep. 11/21/16  Yes Ladell Pier, MD  HYDROmorphone (DILAUDID) 2 MG tablet Take 1 tablet (2 mg total) by mouth every 6 (six) hours as needed. 11/28/16   Dorie Rank, MD    Family History Family History  Problem Relation Age of Onset  . Colon cancer Mother     dx in her 65s  . Diabetes Mother   . Breast cancer Maternal Aunt 81  . Diabetes Maternal Aunt   . Esophageal cancer Maternal Aunt 78  . Kidney failure Brother 12  . Hypertension Brother 70  . Stomach cancer Neg Hx   . Rectal cancer Neg Hx     Social History Social History  Substance Use Topics  . Smoking status: Never Smoker  . Smokeless tobacco: Never Used  . Alcohol use No     Allergies  Lactose intolerance (gi)   Review of Systems Review of Systems  All other systems reviewed and are negative.    Physical Exam Updated Vital Signs BP 133/92   Pulse 86   Temp 98 F (36.7 C) (Oral)   Resp 18   Ht '6\' 1"'$  (1.854 m)   Wt 83.9 kg   SpO2 95%   BMI 24.41 kg/m   Physical Exam  Constitutional: He appears well-developed and well-nourished. No distress.  HENT:  Head: Normocephalic and atraumatic.  Right Ear: External ear normal.  Left Ear: External ear normal.  Eyes: Conjunctivae are normal. Right eye exhibits no discharge. Left eye exhibits no discharge. No scleral icterus.  Neck: Neck supple. No tracheal deviation present.  Tracheostomy in place, no resp difficulty  Cardiovascular: Normal rate, regular rhythm and intact distal pulses.   Pulmonary/Chest: Effort normal and breath sounds normal. No stridor. No respiratory distress. He has no wheezes. He has no rales.  Abdominal: Soft. Bowel sounds are normal. He exhibits no distension. There is no tenderness. There is no rebound and no guarding.  Musculoskeletal: He exhibits no edema.       Lumbar back: He exhibits tenderness and bony tenderness. He exhibits no swelling and no edema.    Neurological: He is alert. He has normal strength. No cranial nerve deficit (no facial droop, extraocular movements intact, no slurred speech) or sensory deficit. He exhibits normal muscle tone. He displays no seizure activity. Coordination normal.  5/5 strength bilateral upper extremity and lower extremity, sensation intact  Skin: Skin is warm and dry. No rash noted.  Psychiatric: He has a normal mood and affect.  Nursing note and vitals reviewed.    ED Treatments / Results  Labs (all labs ordered are listed, but only abnormal results are displayed) Labs Reviewed - No data to display  EKG  EKG Interpretation None       Radiology Dg Lumbar Spine Complete  Result Date: 11/28/2016 CLINICAL DATA:  50 year old male with a history of right hip pain and lower back pain EXAM: LUMBAR SPINE - COMPLETE 4+ VIEW COMPARISON:  CT abdomen 04/20/2016 FINDINGS: Relative alignment of the lumbar elements maintained with no subluxation. Trace retrolisthesis of L5 on S1. Anterior view demonstrates mild apex left scoliotic curvature. Oblique images demonstrate no displaced pars defect. Vertebral body heights relatively maintained. No acute fracture identified. Mild disc space narrowing throughout the lumbar spine. Developing facet hypertrophy most pronounced at the L3-S1 levels. IMPRESSION: Negative for acute fracture malalignment of the lumbar spine. Developing facet disease most pronounced at L3-S1. Signed, Dulcy Fanny. Earleen Newport, DO [facility] Electronically Signed   By: Corrie Mckusick D.O.   On: 11/28/2016 17:24   Ct Pelvis Wo Contrast  Result Date: 11/28/2016 CLINICAL DATA:  Intolerable low back pain, progressively worsening over 3 weeks. EXAM: CT PELVIS WITHOUT CONTRAST TECHNIQUE: Multidetector CT imaging of the pelvis was performed following the standard protocol without intravenous contrast. COMPARISON:  04/20/2016 FINDINGS: Urinary Tract: Urinary  bladder and distal ureters are normal. No distal ureteral calculi. Bowel:  Visible bowel is normal. Vascular/Lymphatic: No vascular abnormality is evident on unenhanced scanning. No pathologic adenopathy is evident in the pelvis or low abdomen. Reproductive:  Unremarkable Other:  No acute inflammatory changes.  No ascites. Musculoskeletal: No bone lesion, bony destruction, or fracture. Moderately severe lumbar facet arthritis, greatest at L4-5 on the right. Good preservation of the intervertebral disc spaces. Minimal anterolisthesis at L4-5, appearing degenerative and unchanged. IMPRESSION: Lower lumbar  facet arthritis and minimal L4-5 degenerative spondylolisthesis. No acute findings are evident. Electronically Signed   By: Andreas Newport M.D.   On: 11/28/2016 19:28   Dg Hip Unilat W Or Wo Pelvis 2-3 Views Right  Result Date: 11/28/2016 CLINICAL DATA:  Pain. No known injury. Metastatic esophageal cancer. EXAM: DG HIP (WITH OR WITHOUT PELVIS) 2-3V RIGHT COMPARISON:  CT 04/20/2016 PET-CT 06/15/2014. FINDINGS: Degenerative changes lumbar spine and both hips. Calcific density noted adjacent to the right greater trochanter most likely from tendinous calcification. Questionable lucencies are present in the right femoral neck. To a further evaluate gadolinium-enhanced MRI of the right hip suggested . IMPRESSION: Questionable lucencies are noted in the right femoral neck. To further evaluate gadolinium-enhanced MRI of the right hip suggested . No evidence of fracture or dislocation. Electronically Signed   By: Marcello Moores  Register   On: 11/28/2016 17:24    Procedures Procedures (including critical care time)  Medications Ordered in ED Medications  0.9 %  sodium chloride infusion ( Intravenous Stopped 11/28/16 1954)  HYDROmorphone (DILAUDID) injection 1 mg (1 mg Intravenous Given 11/28/16 1643)     Initial Impression / Assessment and Plan / ED Course  I have reviewed the triage vital signs and the nursing  notes.  Pertinent labs & imaging results that were available during my care of the patient were reviewed by me and considered in my medical decision making (see chart for details).  Clinical Course as of Nov 29 1955  Tue Nov 28, 2016  1955 Pain improved with ED treatment  [JK]    Clinical Course User Index [JK] Dorie Rank, MD  Patient presented with back pain. Unfortunately he has history of esophageal cancer as well as concern about the possibility of metastatic lesions. Plain films suggested questionable lucency in the hip area. MRI was recommended however the patient has a metallic tracheostomy. Although that could be replaced with a plastic one he's not had that done before and he is not having any respiratory issues. I hesitate replacement just for an MRI. I discussed with Dr. Katherine Basset who felt that CT scan would be a reasonable alternative.  CT scan does not show any signs of any bony destruction or malignancy. Patient is stable for discharge. I will give him a prescription for hydromorphone tablets.  Final Clinical Impressions(s) / ED Diagnoses   Final diagnoses:  Acute right-sided low back pain, with sciatica presence unspecified    New Prescriptions New Prescriptions   HYDROMORPHONE (DILAUDID) 2 MG TABLET    Take 1 tablet (2 mg total) by mouth every 6 (six) hours as needed.     Dorie Rank, MD 11/28/16 [phone]

## 2016-11-29 ENCOUNTER — Other Ambulatory Visit: Payer: Self-pay | Admitting: *Deleted

## 2016-11-29 MED ORDER — HYDROCODONE-ACETAMINOPHEN 7.5-325 MG/15ML PO SOLN: 10.0000 mL | Freq: Four times a day (QID) | ORAL | 0 refills | Status: DC | PRN

## 2016-11-29 MED FILL — HYDROMORPHONE 5 MG/5 ML SOL: 1 | 7 days supply | Qty: 50 | Fill #0

## 2016-11-29 MED FILL — HYDROCOD-APAP 7.5-325/15ML: 7.5-325 | 6 days supply | Qty: 240 | Fill #0

## 2016-11-29 NOTE — Telephone Encounter (Signed)
Pt came in to office requesting Hydrocodone refill. He has not filled Hydromorphone script yet. Stated he wants to continue Hydrocodone during the day and will take Hydromorphone at night for pain. Discussed with Ned Card, NP: OK to refill.

## 2016-12-08 ENCOUNTER — Emergency Department (HOSPITAL_COMMUNITY)
Admission: EM | Admit: 2016-12-08 | Discharge: 2016-12-08 | Disposition: A | Discharge status: Home / Self Care | Payer: BLUE CROSS/BLUE SHIELD | Attending: Emergency Medicine | Admitting: Emergency Medicine

## 2016-12-08 ENCOUNTER — Other Ambulatory Visit: Payer: Self-pay | Admitting: Oncology

## 2016-12-08 ENCOUNTER — Ambulatory Visit (HOSPITAL_COMMUNITY)
Admission: RE | Admit: 2016-12-08 | Discharge: 2016-12-08 | Disposition: A | Discharge status: Home / Self Care | Payer: BLUE CROSS/BLUE SHIELD | Source: Ambulatory Visit | Attending: Oncology | Admitting: Oncology

## 2016-12-08 ENCOUNTER — Emergency Department (HOSPITAL_COMMUNITY): Payer: BLUE CROSS/BLUE SHIELD

## 2016-12-08 ENCOUNTER — Encounter (HOSPITAL_COMMUNITY): Payer: Self-pay

## 2016-12-08 ENCOUNTER — Other Ambulatory Visit: Payer: Self-pay | Admitting: *Deleted

## 2016-12-08 ENCOUNTER — Other Ambulatory Visit: Payer: Self-pay

## 2016-12-08 ENCOUNTER — Encounter (HOSPITAL_COMMUNITY): Payer: Self-pay | Admitting: Emergency Medicine

## 2016-12-08 DIAGNOSIS — Z8501 Personal history of malignant neoplasm of esophagus: Secondary | ICD-10-CM | POA: Insufficient documentation

## 2016-12-08 DIAGNOSIS — I700 Atherosclerosis of aorta: Secondary | ICD-10-CM

## 2016-12-08 DIAGNOSIS — J90 Pleural effusion, not elsewhere classified: Secondary | ICD-10-CM

## 2016-12-08 DIAGNOSIS — C7971 Secondary malignant neoplasm of right adrenal gland: Secondary | ICD-10-CM

## 2016-12-08 DIAGNOSIS — Z79899 Other long term (current) drug therapy: Secondary | ICD-10-CM | POA: Insufficient documentation

## 2016-12-08 DIAGNOSIS — R0789 Other chest pain: Secondary | ICD-10-CM | POA: Insufficient documentation

## 2016-12-08 DIAGNOSIS — D649 Anemia, unspecified: Secondary | ICD-10-CM | POA: Diagnosis not present

## 2016-12-08 DIAGNOSIS — G8929 Other chronic pain: Secondary | ICD-10-CM | POA: Diagnosis not present

## 2016-12-08 DIAGNOSIS — C155 Malignant neoplasm of lower third of esophagus: Secondary | ICD-10-CM

## 2016-12-08 DIAGNOSIS — C153 Malignant neoplasm of upper third of esophagus: Secondary | ICD-10-CM

## 2016-12-08 DIAGNOSIS — C7802 Secondary malignant neoplasm of left lung: Secondary | ICD-10-CM | POA: Insufficient documentation

## 2016-12-08 DIAGNOSIS — K8020 Calculus of gallbladder without cholecystitis without obstruction: Secondary | ICD-10-CM | POA: Insufficient documentation

## 2016-12-08 DIAGNOSIS — M549 Dorsalgia, unspecified: Secondary | ICD-10-CM | POA: Insufficient documentation

## 2016-12-08 LAB — BASIC METABOLIC PANEL
Anion gap: 10 (ref 5–15)
BUN: 14 mg/dL (ref 6–20)
CO2: 27 mmol/L (ref 22–32)
CREATININE: 0.62 mg/dL (ref 0.61–1.24)
Calcium: 8.8 mg/dL — ABNORMAL LOW (ref 8.9–10.3)
Chloride: 101 mmol/L (ref 101–111)
GFR calc Af Amer: >60 mL/min (ref >60)
GLUCOSE: 116 mg/dL — AB (ref 65–99)
POTASSIUM: 4.1 mmol/L (ref 3.5–5.1)
Sodium: 138 mmol/L (ref 135–145)

## 2016-12-08 LAB — CBC
HEMATOCRIT: 36.7 % — AB (ref 39.0–52.0)
Hemoglobin: 11.7 g/dL — ABNORMAL LOW (ref 13.0–17.0)
MCH: 27.1 pg (ref 26.0–34.0)
MCHC: 31.9 g/dL (ref 30.0–36.0)
MCV: 85.2 fL (ref 78.0–100.0)
Platelets: 164 10*3/uL (ref 150–400)
RBC: 4.31 MIL/uL (ref 4.22–5.81)
RDW: 15.9 % — AB (ref 11.5–15.5)
WBC: 15.1 10*3/uL — ABNORMAL HIGH (ref 4.0–10.5)

## 2016-12-08 LAB — I-STAT TROPONIN, ED
Troponin i, poc: 0 ng/mL (ref 0.00–0.08)
Troponin i, poc: 0 ng/mL (ref 0.00–0.08)

## 2016-12-08 MED ORDER — HYDROMORPHONE HCL 2 MG/ML IJ SOLN
2.0000 mg | Freq: Once | INTRAMUSCULAR | Status: AC
  Administered 2016-12-08: 2 mg via INTRAVENOUS
  Filled 2016-12-08: qty 1

## 2016-12-08 MED ORDER — ZOLPIDEM TARTRATE 5 MG PO TABS: 5.0000 mg | ORAL_TABLET | Freq: Every evening | ORAL | 0 refills | Status: DC | PRN

## 2016-12-08 MED ORDER — HYDROMORPHONE HCL 2 MG PO TABS: 2.0000 mg | ORAL_TABLET | Freq: Four times a day (QID) | ORAL | 0 refills | Status: DC | PRN

## 2016-12-08 MED ORDER — HEPARIN SOD (PORK) LOCK FLUSH 100 UNIT/ML IV SOLN
INTRAVENOUS | Status: AC
  Filled 2016-12-08: qty 5

## 2016-12-08 MED ORDER — CYCLOBENZAPRINE HCL 10 MG PO TABS: 10.0000 mg | ORAL_TABLET | Freq: Three times a day (TID) | ORAL | 0 refills | Status: DC | PRN

## 2016-12-08 MED ORDER — HEPARIN SOD (PORK) LOCK FLUSH 100 UNIT/ML IV SOLN
500.0000 [IU] | Freq: Once | INTRAVENOUS | Status: DC
  Filled 2016-12-08: qty 5

## 2016-12-08 MED ORDER — HYDROCODONE-ACETAMINOPHEN 7.5-325 MG/15ML PO SOLN: ORAL | 0 refills | Status: DC

## 2016-12-08 MED ORDER — HYDROMORPHONE HCL 4 MG PO TABS: 2.0000 mg | ORAL_TABLET | Freq: Four times a day (QID) | ORAL | 0 refills | Status: DC | PRN

## 2016-12-08 MED ORDER — HYDROCODONE-ACETAMINOPHEN 7.5-325 MG/15ML PO SOLN: 10.0000 mL | Freq: Four times a day (QID) | ORAL | 0 refills | Status: DC | PRN

## 2016-12-08 MED ORDER — IOPAMIDOL (ISOVUE-300) INJECTION 61%
INTRAVENOUS | Status: AC
  Administered 2016-12-08: 75 mL
  Filled 2016-12-08: qty 75

## 2016-12-08 MED FILL — CYCLOBENZAPRINE 10 MG TAB: 10 | 10 days supply | Qty: 30 | Fill #0

## 2016-12-08 NOTE — Discharge Instructions (Addendum)
Contact Dr.Paterson on 12/12/2016 to let him know that you're here today with chest pain. Take the pain medicine prescribed as directed as needed for pain. Also let Dr.Sherrill know that you were here today. Return if concern for any reason

## 2016-12-08 NOTE — ED Provider Notes (Addendum)
[facility] DEPT Provider Note   CSN: [id number] Arrival date & time: 12/08/16  1330     History   Chief Complaint Chief Complaint  Patient presents with  . Chest Pain  . 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 Complained of left anterior chest pain onset parsley 1 PM today accompanied by shortness of breath while in radiology suite getting previously ordered chest CT scan as outpatient. Symptoms resolve spontaneously after approximately 30 minutes. Discomfort described as pressure, nonradiating. No other associated symptoms no nausea no sweatiness or lightheadedness. Other associated symptoms include a cough which is chronic and unchanged. He also complains of chronic pain at right sacroiliac joint. Nonradiating, which is worse with changing positions improved with remaining still. He denies any fever. Presently feels at baseline  HPI  Past Medical History:  Diagnosis Date  . Anxiety   . Colon polyp   . Diverticulosis   . 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 GERD (gastroesophageal reflux disease)   . History of radiation therapy 04/07/13-05-15-13   Esopageal ca,50.4Gy/81f and again 2015 neck area at [facility]  . Hyperlipemia   . Pre-diabetes   . Shortness of breath dyspnea   . Sleep apnea    no c-pap at this time    Patient Active Problem List   Diagnosis Date Noted  . Shoulder pain, left 10/06/2016  . Dysphagia   . Esophageal stricture   . Port catheter in place 05/12/2016  . Chest pain 05/02/2016  . Dyspnea 05/02/2016  . Paroxysmal atrial fibrillation ([facility] 05/02/2016  . Mass in chest 05/02/2016  . Malignant neoplasm of esophagus ([facility]   . Knee pain   . Pain   . Atrial fibrillation, new onset ([facility] 04/25/2016  . Elevated liver enzymes   . Encounter for chest tube removal   . Acute respiratory failure with hypoxia ([facility] 04/21/2016  . Pericardial effusion with cardiac tamponade 04/21/2016  . Leukocytosis 04/21/2016    . Anemia of chronic disease 04/21/2016  . Hilar adenopathy 04/21/2016  . Lymphatic obstruction 04/21/2016  . HCAP (healthcare-associated pneumonia) 04/20/2016  . Cancer of distal third of esophagus ([facility] 03/28/2013    Past Surgical History:  Procedure Laterality Date  . APPENDECTOMY    . BALLOON DILATION N/A 06/05/2016   Procedure: BALLOON DILATION;  Surgeon: JIrene Shipper MD;  [facility];  Service: Endoscopy;  Laterality: N/A;  . COLONOSCOPY    . COMPLETE ESOPHAGECTOMY N/A 07/07/2013   Procedure: ESOPHAGECTOMY COMPLETE;  Surgeon: EGrace Isaac MD;  [facility]  Service: Thoracic;  Laterality: N/A;  transhiatel esophagectomy, feeding jejunostomy  . EDG  03/18/2013   Esophageal Mass  . ESOPHAGOGASTRODUODENOSCOPY N/A 07/02/2014   Procedure: ESOPHAGOGASTRODUODENOSCOPY (EGD);  Surgeon: CGatha Mayer MD;  [facility];  Service: Endoscopy;  Laterality: N/A;  . ESOPHAGOGASTRODUODENOSCOPY N/A 02/16/2015   Procedure: ESOPHAGOGASTRODUODENOSCOPY (EGD);  Surgeon: DLafayette Dragon MD;  [facility];  Service: Endoscopy;  Laterality: N/A;  . ESOPHAGOGASTRODUODENOSCOPY N/A 09/29/2015   Procedure: ESOPHAGOGASTRODUODENOSCOPY (EGD);  Surgeon: MLadene Artist MD;  [facility];  Service: Endoscopy;  Laterality: N/A;  . ESOPHAGOGASTRODUODENOSCOPY N/A 06/05/2016   Procedure: ESOPHAGOGASTRODUODENOSCOPY (EGD);  Surgeon: JIrene Shipper MD;  [facility];  Service: Endoscopy;  Laterality: N/A;  . EUS N/A 06/25/2014   Procedure: UPPER ENDOSCOPIC ULTRASOUND (EUS) LINEAR;  Surgeon: DMilus Banister MD;  Location[facility];  Service: Endoscopy;  Laterality: N/A;  .  LIPOMA EXCISION    . SUBXYPHOID PERICARDIAL WINDOW N/A 04/21/2016   Procedure: SUBXYPHOID PERICARDIAL WINDOW;  Surgeon: Melrose Nakayama, MD;  Location: [facility];  Service: Thoracic;  Laterality: N/A;  . UPPER GASTROINTESTINAL ENDOSCOPY    . VIDEO BRONCHOSCOPY N/A 07/07/2013   Procedure: VIDEO  BRONCHOSCOPY;  Surgeon: Grace Isaac, MD;  Location[facility];  Service: Thoracic;  Laterality: N/A;  . vocal chord surgery     pushed right vocal cord more centered- September 2015    OB History    No data available       Home Medications    Prior to Admission medications   Medication Sig Start Date End Date Taking? Authorizing Provider  acetaminophen (TYLENOL) 500 MG tablet Take 1,000 mg by mouth every 6 (six) hours as needed for mild pain or moderate pain.    Historical Provider, MD  ALPRAZolam Duanne Moron) 0.5 MG tablet TAKE 1 TABLET BY MOUTH 3 TIMES DAILY AS NEEDED FOR ANXIETY 12/08/16   Ladell Pier, MD  Ascorbic Acid (VITAMIN C) 1000 MG tablet Take 1,000 mg by mouth 3 (three) times daily.    Historical Provider, MD  benzonatate (TESSALON) 100 MG capsule Take 1 capsule (100 mg total) by mouth 3 (three) times daily as needed for cough. 11/23/16   Ladell Pier, MD  cholecalciferol (VITAMIN D) 1000 units tablet Take 1,000 Units by mouth daily.    Historical Provider, MD  cyclobenzaprine (FLEXERIL) 10 MG tablet Take 1 tablet (10 mg total) by mouth 3 (three) times daily as needed for muscle spasms. 12/08/16   Ladell Pier, MD  HYDROcodone-acetaminophen (HYCET) 7.5-325 mg/15 ml solution 10 ml. by mouth four times daily for cough 12/08/16   Ladell Pier, MD  HYDROmorphone (DILAUDID) 2 MG tablet Take 1 tablet (2 mg total) by mouth every 6 (six) hours as needed. 12/08/16   Ladell Pier, MD  ibuprofen (ADVIL,MOTRIN) 200 MG tablet Take 400 mg by mouth every 8 (eight) hours as needed for moderate pain. Reported on 05/12/2016    Historical Provider, MD  lactase (LACTAID) 3000 UNITS tablet Take 3,000 Units by mouth daily as needed (lactose intolerance). Reported on 05/12/2016    Historical Provider, MD  lidocaine-prilocaine (EMLA) cream Apply 1 application topically as needed. Apply tablespoon to PAC 2 hours prior to stick and cover with plastic wrap 11/09/15   Ladell Pier, MD    mirtazapine (REMERON) 30 MG tablet Take 1 tablet (30 mg total) by mouth at bedtime. 11/23/16   Ladell Pier, MD  pregabalin (LYRICA) 50 MG capsule Take 1 capsule (50 mg total) by mouth 2 (two) times daily. 11/23/16   Ladell Pier, MD  zolpidem (AMBIEN) 5 MG tablet Take 1 tablet (5 mg total) by mouth at bedtime as needed for sleep. 12/08/16   Ladell Pier, MD    Family History Family History  Problem Relation Age of Onset  . Colon cancer Mother     dx in her 21s  . Diabetes Mother   . Breast cancer Maternal Aunt 81  . Diabetes Maternal Aunt   . Esophageal cancer Maternal Aunt 78  . Kidney failure Brother 53  . Hypertension Brother 43  . Stomach cancer Neg Hx   . Rectal cancer Neg Hx     Social History Social History  Substance Use Topics  . Smoking status: Never Smoker  . Smokeless tobacco: Never Used  . Alcohol use No     Allergies  Lactose intolerance (gi)   Review of Systems Review of Systems  Constitutional: Negative.   HENT:       Chronic coarseness. Tracheostomy in place  Respiratory: Positive for cough and shortness of breath.   Cardiovascular: Positive for chest pain.  Gastrointestinal: Negative.   Musculoskeletal: Positive for back pain.  Skin: Negative.   Allergic/Immunologic: Positive for immunocompromised state.  Neurological: Negative.   Psychiatric/Behavioral: Negative.   All other systems reviewed and are negative.    Physical Exam Updated Vital Signs BP 114/90   Pulse 98   Temp 99 F (37.2 C) (Oral)   Resp 24   SpO2 95%   Physical Exam  Constitutional: He is oriented to person, place, and time. He appears well-developed and well-nourished.  HENT:  Head: Normocephalic and atraumatic.  No mucosal lesion  Eyes: Conjunctivae are normal. Pupils are equal, round, and reactive to light.  Neck: Neck supple. No tracheal deviation present. No thyromegaly present.  Tracheostomy in place  Cardiovascular: Normal rate and regular rhythm.    No murmur heard. Pulmonary/Chest: Effort normal. No respiratory distress.  Diffuse scant rhonchi  Abdominal: Soft. Bowel sounds are normal. He exhibits no distension. There is no tenderness.  Musculoskeletal: Normal range of motion. He exhibits no edema or tenderness.  Pain at right lower back upon sitting up from supine position  Neurological: He is alert and oriented to person, place, and time. Coordination normal.  Skin: Skin is warm and dry. No rash noted.  Psychiatric: He has a normal mood and affect.  Nursing note and vitals reviewed.    ED Treatments / Results  Labs (all labs ordered are listed, but only abnormal results are displayed) Labs Reviewed  BASIC METABOLIC PANEL  CBC  I-STAT [place], ED    EKG  EKG Interpretation  Date/Time:  Friday December 08 2016 13:40:21 EST Ventricular Rate:  108 PR Interval:    QRS Duration: 88 QT Interval:  338 QTC Calculation: 453 R Axis:   52 Text Interpretation:  Sinus tachycardia Low voltage, precordial leads Abnormal R-wave progression, early transition Nonspecific T abnormalities, lateral leads No significant change since last tracing Confirmed by Winfred Leeds  MD, Julissa Browning [phone]) on 12/08/2016 2:22:21 PM      Results for orders placed or performed during the hospital encounter of 73/71/06  Basic metabolic panel  Result Value Ref Range   Sodium 138 135 - 145 mmol/L   Potassium 4.1 3.5 - 5.1 mmol/L   Chloride 101 101 - 111 mmol/L   CO2 27 22 - 32 mmol/L   Glucose, Bld 116 (H) 65 - 99 mg/dL   BUN 14 6 - 20 mg/dL   Creatinine, Ser 0.62 0.61 - 1.24 mg/dL   Calcium 8.8 (L) 8.9 - 10.3 mg/dL   GFR calc non Af Amer >60 >60 mL/min   GFR calc Af Amer >60 >60 mL/min   Anion gap 10 5 - 15  CBC  Result Value Ref Range   WBC 15.1 (H) 4.0 - 10.5 K/uL   RBC 4.31 4.22 - 5.81 MIL/uL   Hemoglobin 11.7 (L) 13.0 - 17.0 g/dL   HCT 36.7 (L) 39.0 - 52.0 %   MCV 85.2 78.0 - 100.0 fL   MCH 27.1 26.0 - 34.0 pg   MCHC 31.9 30.0 - 36.0 g/dL    RDW 15.9 (H) 11.5 - 15.5 %   Platelets 164 150 - 400 K/uL  I-stat troponin, ED  Result Value Ref Range   Troponin i, poc 0.00 0.00 - 0.08  ng/mL   Comment 3          I-stat troponin, ED  Result Value Ref Range   Troponin i, poc 0.00 0.00 - 0.08 ng/mL   Comment 3           Dg Chest 2 View  Result Date: 12/08/2016 CLINICAL DATA:  Shortness of Breath EXAM: CHEST  2 VIEW COMPARISON:  May 09, 2016 FINDINGS: There is consolidation in the medial right base region. There is a questionable nodular opacity in the left base, better seen on lateral view measuring 2.9 x 2.9 cm There is a nodular opacity in the left mid lung region measuring 1.7 x 1.6 cm, stable from prior study. There is scarring in the right perihilar region with central peribronchial thickening. Heart size is normal. Pulmonary vascularity is normal. There is questionable left hilar adenopathy. Tracheostomy tip is 4.8 cm above the carina. Central catheter tip is in the superior vena cava. No pneumothorax. No bone lesions. IMPRESSION: Nodular opacities in the left perihilar and left base regions, likely metastases. Consolidation medial left base. Underlying chronic bronchitis. Scarring right perihilar region. Tracheostomy well-seated. No pneumothorax. Port-A-Cath tip in superior vena cava. No pneumothorax. Question left hilar adenopathy. Electronically Signed   By: Lowella Grip III M.D.   On: 12/08/2016 14:29   Dg Lumbar Spine Complete  Result Date: 11/28/2016 CLINICAL DATA:  50 year old male with a history of right hip pain and lower back pain EXAM: LUMBAR SPINE - COMPLETE 4+ VIEW COMPARISON:  CT abdomen 04/20/2016 FINDINGS: Relative alignment of the lumbar elements maintained with no subluxation. Trace retrolisthesis of L5 on S1. Anterior view demonstrates mild apex left scoliotic curvature. Oblique images demonstrate no displaced pars defect. Vertebral body heights relatively maintained. No acute fracture identified. Mild disc space  narrowing throughout the lumbar spine. Developing facet hypertrophy most pronounced at the L3-S1 levels. IMPRESSION: Negative for acute fracture malalignment of the lumbar spine. Developing facet disease most pronounced at L3-S1. Signed, Dulcy Fanny. Earleen Newport, DO [facility] Electronically Signed   By: Corrie Mckusick D.O.   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 EXAM: CT CHEST WITH CONTRAST TECHNIQUE: Multidetector CT imaging of the chest was performed during intravenous contrast administration. CONTRAST:  23m ISOVUE-300 IOPAMIDOL (ISOVUE-300) INJECTION 61% COMPARISON:  10/05/2016 chest CT. FINDINGS: Cardiovascular: Normal heart size. No significant pericardial fluid/thickening. Right internal jugular MediPort terminates at the cavoatrial junction. Mildly atherosclerotic nonaneurysmal thoracic aorta. Normal caliber pulmonary arteries. No central pulmonary emboli. Mediastinum/Nodes: No discrete thyroid nodules. Status post esophagectomy and gastric pull-through. Stable appearance of the esophagogastric anastomosis in the upper mediastinum with no discrete anastomotic mass. No pneumomediastinum. No mediastinal fluid collections. No axillary adenopathy. Left hilar adenopathy measuring up to 1.6 cm (series 2/ image 68), increased from 1.2 cm. Right hilar adenopathy measuring up to 1.3 cm (series 2/ image 76), previously 1.2 cm, not appreciably changed. AP window enlarged 1.1 cm node (series 2/ image 59), previously 1.0 cm, not appreciably changed. No additional pathologically enlarged mediastinal nodes. Lungs/Pleura: Well-positioned tracheostomy tube with the tip in  the tracheal lumen just below thoracic inlet. No pneumothorax. No right pleural effusion. Trace dependent left pleural effusion, slightly decreased. Stable minimal radiation fibrosis in the medial apical right upper lobe. Solid central  left upper lobe 1.9 x 1.5 cm pulmonary nodule (series 5/ image 69), previously 1.8 x 1.4 cm, stable to minimally increased. Superior segment left lower lobe solid 0.4 cm pulmonary nodule (series 5/ image 39), stable. Central left lower lobe solid 2.7 x 2.3 cm pulmonary nodule (series 5/ image 73), previously 2.7 x 2.2 cm, not appreciably changed. Stable parenchymal band at the dependent basilar left lower lobe consistent with postinfectious/ postinflammatory scarring. Asymmetric interlobular septal thickening at the right lung base is not appreciably changed. No acute consolidative airspace disease or new significant pulmonary nodules. Upper abdomen: Cholelithiasis. Right adrenal 3.2 x 2.1 cm mass (series 2/ image 145), previously 2.9 x 1.9 cm, mildly increased. No discrete left adrenal nodule. Subcentimeter hypodense renal cortical lesions in the upper left kidney are too small to characterize and appears stable suggesting benign renal cysts. Musculoskeletal: Stable lytic lesion in the posterior T7 vertebral body. No new focal osseous lesions. Mild thoracic spondylosis. IMPRESSION: 1. Thoracic metastatic burden is overall stable to slightly increased since 10/05/2016. AP window and right hilar adenopathy is stable. Left hilar adenopathy is mildly increased. Left lung metastases are stable to slightly increased. Stable asymmetric interlobular septal thickening at the right lung base suspicious for stable lymphangitic tumor. Stable lytic lesion in the posterior T7 vertebral body. 2. Right adrenal metastasis is mildly increased. 3. Trace left pleural effusion, slightly decreased. 4. Additional findings include aortic atherosclerosis and cholelithiasis. Electronically Signed   By: Ilona Sorrel M.D.   On: 12/08/2016 14:54   Ct Pelvis Wo Contrast  Result Date: 11/28/2016 CLINICAL DATA:  Intolerable low back pain, progressively worsening over 3 weeks. EXAM: CT PELVIS WITHOUT CONTRAST TECHNIQUE: Multidetector CT imaging of the pelvis was performed following the standard protocol without intravenous contrast. COMPARISON:  04/20/2016 FINDINGS: Urinary Tract: Urinary bladder and distal ureters are normal. No distal ureteral calculi. Bowel:  Visible bowel is normal. Vascular/Lymphatic: No vascular abnormality is evident on unenhanced scanning. No pathologic adenopathy is evident in the pelvis or low abdomen. Reproductive:  Unremarkable Other:  No acute inflammatory changes.  No ascites. Musculoskeletal: No bone lesion, bony destruction, or fracture. Moderately severe lumbar facet arthritis, greatest at L4-5 on the right. Good preservation of the intervertebral disc spaces. Minimal anterolisthesis at L4-5, appearing degenerative and unchanged. IMPRESSION: Lower lumbar facet arthritis and minimal L4-5 degenerative spondylolisthesis. No acute findings are evident. Electronically Signed   By: Andreas Newport M.D.   On: 11/28/2016 19:28   Dg Hip Unilat W Or Wo Pelvis 2-3 Views Right  Result Date: 11/28/2016 CLINICAL DATA:  Pain. No known injury. Metastatic esophageal cancer. EXAM: DG HIP (WITH OR WITHOUT PELVIS) 2-3V RIGHT COMPARISON:  CT 04/20/2016 PET-CT 06/15/2014. FINDINGS: Degenerative changes lumbar spine and both hips. Calcific density noted adjacent to the right greater trochanter most likely from tendinous calcification. Questionable lucencies are present in the right femoral neck. To a further evaluate gadolinium-enhanced MRI of the right hip suggested . IMPRESSION: Questionable lucencies are noted in the right femoral neck. To further evaluate gadolinium-enhanced MRI of the right hip suggested . No evidence of fracture or dislocation. Electronically Signed   By: Marcello Moores  Register   On:  11/28/2016 17:24    Radiology Dg Chest 2 View  Result Date: 12/08/2016 CLINICAL DATA:  Shortness of Breath EXAM: CHEST  2 VIEW COMPARISON:  May 09, 2016 FINDINGS: There is consolidation in the medial right base region. There is a questionable nodular opacity in the left base, better seen on lateral view measuring  2.9 x 2.9 cm There is a nodular opacity in the left mid lung region measuring 1.7 x 1.6 cm, stable from prior study. There is scarring in the right perihilar region with central peribronchial thickening. Heart size is normal. Pulmonary vascularity is normal. There is questionable left hilar adenopathy. Tracheostomy tip is 4.8 cm above the carina. Central catheter tip is in the superior vena cava. No pneumothorax. No bone lesions. IMPRESSION: Nodular opacities in the left perihilar and left base regions, likely metastases. Consolidation medial left base. Underlying chronic bronchitis. Scarring right perihilar region. Tracheostomy well-seated. No pneumothorax. Port-A-Cath tip in superior vena cava. No pneumothorax. Question left hilar adenopathy. Electronically Signed   By: Lowella Grip III M.D.   On: 12/08/2016 14:29    Procedures Procedures (including critical care time) 3:45 PM intravenous hydromorphone ordered for patient's low back pain. He feels improved after IV hydromorphone 2 mg administered Medications Ordered in ED Medications  HYDROmorphone (DILAUDID) injection 2 mg (not administered)     Initial Impression / Assessment and Plan / ED Course  I have reviewed the triage vital signs and the nursing notes.  Pertinent labs & imaging results that were available during my care of the patient were reviewed by me and considered in my medical decision making (see chart for details). . Clinical Course    6:10 PM he remains chest pain-free. He reports that he had transient relief of right SI joint pain after treatment with intravenous hydromorphone. He is requesting additional  pain medicine. Additional IV hydromorphone ordered. 6:45 PM patient's pain at [facility] joint is improved. He is alert and able to ambulate without difficulty. Plan discharge per home prescription hydromorphone as he states he's run out. Heart score equals 3. No pulmonary embolus or aortic dissection seen on CT scan of chest today. And follow-up with Dr. Sharlett Iles as well as with Dr. Benay Spice [place] controlled Substance reporting system.queried Final Clinical Impressions(s) / ED Diagnoses  Diagnoses #1 atypical chest pain #2 chronic back pain #3 anemia Final diagnoses:  None    New Prescriptions New Prescriptions   No medications on file     Orlie Dakin, MD 12/08/16 [place], MD 12/08/16 [id number]

## 2016-12-08 NOTE — Telephone Encounter (Signed)
Received call from pt asking for refill on his dilaudid, hydrocodone, flexeril & Lorrin Mais (he reports 3-4 left) & states he has had to take 2 ambien some nights.  He reports having a CT today @ 1:15 pm & would like to p/u after CT.  Message to Dr Sherrill/Pod RN

## 2016-12-08 NOTE — ED Triage Notes (Addendum)
Pt complaint of central chest pressure and SOB worse with exertion onset 1230 with transfer to radiology for outpatient CT. Pt receives chemotherapy for esophageal cancer.

## 2016-12-11 ENCOUNTER — Other Ambulatory Visit: Payer: Self-pay | Admitting: Oncology

## 2016-12-12 MED FILL — ZOLPIDEM TARTRATE 5 MG TAB: 5 | 30 days supply | Qty: 30 | Fill #0

## 2016-12-12 MED FILL — ALPRAZolam 0.5 MG TABS: 0.5 | 20 days supply | Qty: 60 | Fill #0

## 2016-12-12 MED FILL — HYDROCOD-APAP 7.5-325/15ML: 7.5-325 | 6 days supply | Qty: 240 | Fill #0

## 2016-12-12 MED FILL — HYDROmorphone HCL 2 MG TABS: 2 | 5 days supply | Qty: 20 | Fill #0

## 2016-12-14 ENCOUNTER — Telehealth: Payer: Self-pay | Admitting: Oncology

## 2016-12-14 ENCOUNTER — Ambulatory Visit (HOSPITAL_BASED_OUTPATIENT_CLINIC_OR_DEPARTMENT_OTHER): Payer: BLUE CROSS/BLUE SHIELD

## 2016-12-14 ENCOUNTER — Ambulatory Visit (HOSPITAL_BASED_OUTPATIENT_CLINIC_OR_DEPARTMENT_OTHER): Payer: BLUE CROSS/BLUE SHIELD | Admitting: Oncology

## 2016-12-14 ENCOUNTER — Telehealth: Payer: Self-pay | Admitting: *Deleted

## 2016-12-14 ENCOUNTER — Other Ambulatory Visit (HOSPITAL_BASED_OUTPATIENT_CLINIC_OR_DEPARTMENT_OTHER): Payer: BLUE CROSS/BLUE SHIELD

## 2016-12-14 VITALS — BP 115/74 | HR 107 | Temp 98.4°F | Resp 17 | Ht 73.0 in | Wt 175.3 lb

## 2016-12-14 DIAGNOSIS — G620 Drug-induced polyneuropathy: Secondary | ICD-10-CM

## 2016-12-14 DIAGNOSIS — C155 Malignant neoplasm of lower third of esophagus: Secondary | ICD-10-CM

## 2016-12-14 DIAGNOSIS — D6959 Other secondary thrombocytopenia: Secondary | ICD-10-CM | POA: Diagnosis not present

## 2016-12-14 DIAGNOSIS — M545 Low back pain: Secondary | ICD-10-CM | POA: Diagnosis not present

## 2016-12-14 DIAGNOSIS — Z95828 Presence of other vascular implants and grafts: Secondary | ICD-10-CM

## 2016-12-14 DIAGNOSIS — Z5112 Encounter for antineoplastic immunotherapy: Secondary | ICD-10-CM

## 2016-12-14 DIAGNOSIS — F329 Major depressive disorder, single episode, unspecified: Secondary | ICD-10-CM

## 2016-12-14 DIAGNOSIS — C153 Malignant neoplasm of upper third of esophagus: Secondary | ICD-10-CM

## 2016-12-14 DIAGNOSIS — R634 Abnormal weight loss: Secondary | ICD-10-CM

## 2016-12-14 LAB — CBC WITH DIFFERENTIAL/PLATELET
BASO%: 1 % (ref 0.0–2.0)
Basophils Absolute: 0.1 10*3/uL (ref 0.0–0.1)
EOS%: 6.7 % (ref 0.0–7.0)
Eosinophils Absolute: 0.5 10*3/uL (ref 0.0–0.5)
HEMATOCRIT: 36.1 % — AB (ref 38.4–49.9)
HGB: 11.7 g/dL — ABNORMAL LOW (ref 13.0–17.1)
LYMPH#: 0.8 10*3/uL — AB (ref 0.9–3.3)
LYMPH%: 11.2 % — AB (ref 14.0–49.0)
MCH: 27.2 pg (ref 27.2–33.4)
MCHC: 32.3 g/dL (ref 32.0–36.0)
MCV: 84.4 fL (ref 79.3–98.0)
MONO#: 0.7 10*3/uL (ref 0.1–0.9)
MONO%: 9.8 % (ref 0.0–14.0)
NEUT#: 5 10*3/uL (ref 1.5–6.5)
NEUT%: 71.3 % (ref 39.0–75.0)
Platelets: 186 10*3/uL (ref 140–400)
RBC: 4.28 10*6/uL (ref 4.20–5.82)
RDW: 17.6 % — ABNORMAL HIGH (ref 11.0–14.6)
WBC: 7 10*3/uL (ref 4.0–10.3)

## 2016-12-14 LAB — COMPREHENSIVE METABOLIC PANEL
ALT: <6 U/L (ref 0–55)
AST: 11 U/L (ref 5–34)
Albumin: 3.4 g/dL — ABNORMAL LOW (ref 3.5–5.0)
Alkaline Phosphatase: 92 U/L (ref 40–150)
Anion Gap: 9 mEq/L (ref 3–11)
BUN: 12.2 mg/dL (ref 7.0–26.0)
CHLORIDE: 107 meq/L (ref 98–109)
CO2: 26 meq/L (ref 22–29)
CREATININE: 0.7 mg/dL (ref 0.7–1.3)
Calcium: 9.2 mg/dL (ref 8.4–10.4)
EGFR: >90 mL/min/{1.73_m2} (ref >90)
GLUCOSE: 88 mg/dL (ref 70–140)
Potassium: 3.9 mEq/L (ref 3.5–5.1)
SODIUM: 142 meq/L (ref 136–145)
Total Bilirubin: 0.34 mg/dL (ref 0.20–1.20)
Total Protein: 6.9 g/dL (ref 6.4–8.3)

## 2016-12-14 LAB — CEA (IN HOUSE-CHCC): CEA (CHCC-In House): 217.23 ng/mL — ABNORMAL HIGH (ref 0.00–5.00)

## 2016-12-14 MED ORDER — ACETAMINOPHEN 325 MG PO TABS
ORAL_TABLET | ORAL | Status: AC
  Filled 2016-12-14: qty 2

## 2016-12-14 MED ORDER — HEPARIN SOD (PORK) LOCK FLUSH 100 UNIT/ML IV SOLN
500.0000 [IU] | Freq: Once | INTRAVENOUS | Status: DC | PRN
  Filled 2016-12-14: qty 5

## 2016-12-14 MED ORDER — OXYCODONE-ACETAMINOPHEN 5-325 MG PO TABS
ORAL_TABLET | ORAL | Status: AC
  Filled 2016-12-14: qty 2

## 2016-12-14 MED ORDER — ACETAMINOPHEN 325 MG PO TABS
650.0000 mg | ORAL_TABLET | Freq: Once | ORAL | Status: AC
  Administered 2016-12-14: 650 mg via ORAL

## 2016-12-14 MED ORDER — SODIUM CHLORIDE 0.9 % IV SOLN
8.0000 mg/kg | Freq: Once | INTRAVENOUS | Status: AC
  Administered 2016-12-14: 600 mg via INTRAVENOUS
  Filled 2016-12-14: qty 50

## 2016-12-14 MED ORDER — SODIUM CHLORIDE 0.9% FLUSH
10.0000 mL | INTRAVENOUS | Status: DC | PRN
  Filled 2016-12-14: qty 10

## 2016-12-14 MED ORDER — SODIUM CHLORIDE 0.9 % IJ SOLN
10.0000 mL | INTRAMUSCULAR | Status: DC | PRN
  Administered 2016-12-14: 10 mL via INTRAVENOUS
  Filled 2016-12-14: qty 10

## 2016-12-14 MED ORDER — OXYCODONE-ACETAMINOPHEN 5-325 MG PO TABS
2.0000 | ORAL_TABLET | Freq: Once | ORAL | Status: AC
Start: 2016-12-14 — End: 2016-12-14
  Administered 2016-12-14: 2 via ORAL

## 2016-12-14 MED ORDER — CYCLOBENZAPRINE HCL 10 MG PO TABS: 10.0000 mg | ORAL_TABLET | Freq: Three times a day (TID) | ORAL | 0 refills | Status: DC | PRN

## 2016-12-14 MED ORDER — SODIUM CHLORIDE 0.9 % IV SOLN
Freq: Once | INTRAVENOUS | Status: AC
  Administered 2016-12-14: 12:00:00 via INTRAVENOUS

## 2016-12-14 NOTE — Progress Notes (Signed)
Pt complained of 9/10 back pain and spasms. Pt unable to sit in the chair during treatment due to pain. Notified Dr.Sherrill's nurse Tanya,RN. Obtained order for percocet 2 tabs now. Will reassess pain level in 1 hr.

## 2016-12-14 NOTE — Telephone Encounter (Signed)
Per 1/4 LOS I have scheduled appts and notified the scheduler. Notified scheduler first available given on 1/18 and move labs on 1/11

## 2016-12-14 NOTE — Telephone Encounter (Signed)
Appointments scheduled per 1/4 LOS. Patient given AVS report and calendars with future scheduled appointments. °

## 2016-12-14 NOTE — Progress Notes (Signed)
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 He was last treated with ramucirumab on 11/23/2016. He complains of pain in the lower back. He takes Dilaudid for relief of the pain. No leg pain or weakness. He reports partial relief when he underwent a "adjustment "by fellow chiropractor. He was able to travel to [place] for the holidays.   Objective:  Vital signs in last 24 hours:  Blood pressure 115/74, pulse (!) 107, temperature 98.4 F (36.9 C), temperature source Oral, resp. rate 17, height '6\' 1"'$  (1.854 m), weight 175 lb 4.8 oz (79.5 kg), SpO2 97 %.    HEENT:  no thrush Resp:  lungs clear bilaterally Cardio:  regular rate and rhythm GI:  no hepatomegaly, no mass Vascular:  no leg edema Musculoskeletal: Mild tenderness at the lumbar spine   Portacath/PICC-without erythema  Lab Results:  Lab Results  Component Value Date   WBC 7.0 12/14/2016   HGB 11.7 (L) 12/14/2016   HCT 36.1 (L) 12/14/2016   MCV 84.4 12/14/2016   PLT 186 12/14/2016   NEUTROABS 5.0 12/14/2016    Imaging:CT chest 12/08/2016-mild increase in size of left hilar lymph node. Mild increased size of left upper lobe nodule, no change in interlobular septal thickening at the right lung base, slight increase in size of the right adrenal mass. Stable lytic lesion at T7, no new focal osseous lesions   Medications: I have reviewed the patient's current medications.  Assessment/Plan: 1.Adenocarcinoma of the distal esophagus/gastric cardia-status post an endoscopic biopsy on 03/18/2013 confirming adenocarcinoma, HER-2/neunot amplified  -Staging CT scans 03/20/2013 confirmed a distal esophageal/gastric cardia mass, distal paraesophageal lymph nodes and paratracheal nodes concerning for metastatic lymphadenopathy  -A PET scan 03/27/2013 with a multifocal area of hypermetabolic activity involving the thoracic esophagus  extending into the gastric cardia and hypermetabolic mediastinal nodes, no distant metastatic disease  -Initiation of radiation 04/07/2013 and weekly Taxol/carboplatin 04/08/2013, radiation completed 05/15/2013  -Restaging PET scan 06/09/2013-no evidence of metastatic disease, no hypermetabolic lymphadenopathy  -Esophagogastrectomy 07/07/2013 revealed a ypT1b,ypN0 tumor with negative surgical margins  -CT neck 06/09/2014 revealed a necrotic lymph node in the right tracheoesophageal groove  -PET scan 11/15/2014 revealed a single focus of hypermetabolism corresponding to the necrotic right upper mediastinum lymph node -EUS biopsy of the paraesophageal lymph node 06/25/2012 confirmed metastatic adenocarcinoma  -Radiation completed 08/11/2014 -Rising CEA May 2016 -PET scan 07/13/2015 with hypermetabolic right paratracheal and right hilar adenopathy -Cycle 1 FOLFOX 08/03/2015 -Cycle 2 FOLFOX 08/17/2015 - cycle 3 FOLFOX 08/31/2015 (oxaliplatin dose reduced secondary to thrombocytopenia)  -Cycle 4 FOLFOX 09/14/2015 -Cycle 5 FOLFOX 09/28/2015 - restaging CTs 09/30/2015 with stable disease involving neck/chest lymph nodes, a slight increase in size of left upper lobe lung nodule  - cycle 6 FOLFOX 10/12/2015  -FOLFOX discontinued secondary to development of bilateral vocal cord paralysis November 2016 - CT scan of the neck and chest on 11/22/15 revealed no definite disease in the neck. At the thoracic inlet region, there is questionably slight increase in prominence of soft tissue at the right tracheoesophageal groove. This could be an enlarging node or mass but could also relate to the esophagectomy and pull-through surgery.Enlarging lingular and left lower lobe nodules are worrisome for metastatic disease. Peribronchovascular nodular consolidation in the anterior right lower lobe, slightly more prominent, indeterminate. Cholelithiasis. Left diaphragmatic hernia, stable. -Xeloda 1 week on/1 week  off beginning 12/11/2015 -Restaging CTs of the neck and chest 02/07/2016 revealed enlargement  of 2 left-sided lung nodules, no other evidence of disease progression -Cycle 1 Pembrolizumab 02/25/2016  -Cycle 2 Pembrolizumab 03/17/2016 -Cycle 3 Pembrolizumab 04/07/2016   -CT 04/20/2016 consistent with progressive disease involving a large pericardial effusion, bilateral pleural effusions, enlarged mediastinal lymph nodes and lung nodules  Cytology from the pericardial fluid 04/21/2016-positive for metastatic adenocarcinoma -CT 05/01/2016 was slight worsening of right hilar lymphadenopathy with near complete collapse of the right middle lobe, nodular metastases in the left lung, stable bilateral pleural effusions  Cycle 1 Taxol/ramucirumab 05/12/2016; day 8 Taxol 05/19/2016; day 15 Taxol/ramucirumab 05/26/2016.  Cycle 2 day 1 Taxol/ramucirumab 06/22/2016  Cycle 2 day 8 Taxol 06/29/2016   Cycle 2 day 15 Taxol/ramucirumab07/28/2017  Cycle 3 day 1 Taxol/ramucirumab 07/20/2016  Cycle 3 day 8 Taxol 07/27/2016  Cycle 3 day 15 Taxol/ramucirumab08/24/2017  Restaging chest CT 08/10/2016-left upper lobe pulmonary nodule similar; left lower lobe pulmonary nodule may have enlarged minimally; improved appearance of the mediastinum and right infrahilar region with decreased soft tissue fullness; improvement in probable left infrahilar adenopathy; decreased left and resolved right pleural effusions; developing right adrenal nodularity; T7 vertebral body lesion suspicious for osseous metastasis.  Cycle 4 Taxol/ramucirumab09/06/2016   cycle 5 Taxol/ramucirumab10/03/2016  restaging chest CT 10/05/2016 with slight enlargement of mediastinal lymph nodes and pulmonary nodules, enlargement of left adrenal lesion,? Lymphatic tumor spread in the right lung  Cycle 6 Taxol/ ramucirumab11/01/2016; Taxol placed on hold beginning 10/26/2016 due to neuropathy symptoms, continuation of ramucirumab  alone  Cycle 7Taxol/ ramucirumab11/30/2017-Taxol remains on hold   restaging chest CT 12/08/2016-minimal increase in size of a left hilar node and left lung nodule. Mild increase in the size of a right adrenal metastasis  Cycle 8 Taxol/ ramucirumab 12/14/2016-Taxol resumed  2. Indeterminate 15 mm right liver lesion on the CT scan 03/20/2013, MRI of the liver 06/09/2013 confirmed a right liver hemangioma  3. Anorexia/weight loss and solid dysphagia secondary to #1 -improved 4. History of a colon polyp, status post a polypectomy October 2013  5. history of Thrombocytopenia secondary chemotherapy-the carboplatin wase dose reduced with week 5 of chemotherapy chemotherapy  6. hoarseness secondary to right vocal cord paralysis-CT/PET scan imaging consistent with a malignant right paratracheal lymph node causing the vocal cord paralysis  -Status post a laryngeal plasty at [facility] on 09/02/2014 7. Esophageal stricture-status post dilation 06/08/2015 -Food impaction removal 09/29/2015 -Esophageal stricture dilation 10/06/2015 8. Port-A-Cath placement 07/20/2015 9. Delayed nausea following cycle 1 FOLFOX. Aloxi and Emend added with cycle 2. 10. Thrombocytopenia secondary to chemotherapy  11. Emotional lability/depression-prophylactic Decadron discontinued; Remeron initiated 12. Progressive hoarseness November 2016, diagnosed with bilateral vocal cord paralysis, status post a tracheostomy and bilateral injection laryngoplasty at [facility] on 10/27/2015 13. Pericardial window 04/21/2016  14. High fever 07/06/2016-chest x-ray suggestive of possible left lower lobe pneumonia-treated with Augmentin  15. 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 Taxol placed on hold beginning 10/26/2016. Lyrica added 11/23/2016 16. Depression-Remeron dose increased to 30 mg 11/23/2016 17. Back pain-potentially related to progressive esophagus cancer     Disposition:   His overall  status appears unchanged. The restaging CT does not reveal significant evidence of disease progression. He has been maintained off of Taxol for the past 2 months secondary to neuropathy symptoms. We decided to resume the Taxol treatment. He reports Lyrica has helped the peripheral tingling. He understands the neuropathy could progress with resumption of Taxol.  He will continue the current narcotic regimen for pain. The pain may be related to bone metastases,  though no new bone lesions were seen on the CT of the chest. I recommended a lumbar MRI. He does not wish to schedule this at present.  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  25 minutes were spent with the patient today. The majority of the time was used for counseling and coordination of care. Chemotherapy orders were entered.  Betsy Coder, MD  12/14/2016  3:23 PM

## 2016-12-14 NOTE — Patient Instructions (Signed)
[place] Discharge Instructions for Patients Receiving Chemotherapy  Today you received the following chemotherapy agents:  Cyramza  To help prevent nausea and vomiting after your treatment, we encourage you to take your nausea medication as ordered per MD.   If you develop nausea and vomiting that is not controlled by your nausea medication, call the clinic.   BELOW ARE SYMPTOMS THAT SHOULD BE REPORTED IMMEDIATELY:  *FEVER GREATER THAN 100.5 F  *CHILLS WITH OR WITHOUT FEVER  NAUSEA AND VOMITING THAT IS NOT CONTROLLED WITH YOUR NAUSEA MEDICATION  *UNUSUAL SHORTNESS OF BREATH  *UNUSUAL BRUISING OR BLEEDING  TENDERNESS IN MOUTH AND THROAT WITH OR WITHOUT PRESENCE OF ULCERS  *URINARY PROBLEMS  *BOWEL PROBLEMS  UNUSUAL RASH Items with * indicate a potential emergency and should be followed up as soon as possible.  Feel free to call the clinic you have any questions or concerns. The clinic phone number is [phone].  Please show the [place] at check-in to the Emergency Department and triage nurse.

## 2016-12-17 ENCOUNTER — Other Ambulatory Visit: Payer: Self-pay | Admitting: Oncology

## 2016-12-20 ENCOUNTER — Encounter: Payer: Self-pay | Admitting: Pharmacist

## 2016-12-21 ENCOUNTER — Ambulatory Visit (HOSPITAL_BASED_OUTPATIENT_CLINIC_OR_DEPARTMENT_OTHER): Payer: BLUE CROSS/BLUE SHIELD

## 2016-12-21 ENCOUNTER — Other Ambulatory Visit (HOSPITAL_BASED_OUTPATIENT_CLINIC_OR_DEPARTMENT_OTHER): Payer: BLUE CROSS/BLUE SHIELD

## 2016-12-21 ENCOUNTER — Other Ambulatory Visit: Payer: Self-pay | Admitting: Oncology

## 2016-12-21 ENCOUNTER — Ambulatory Visit: Payer: BLUE CROSS/BLUE SHIELD

## 2016-12-21 ENCOUNTER — Encounter: Payer: BLUE CROSS/BLUE SHIELD | Admitting: Nutrition

## 2016-12-21 VITALS — BP 130/84 | HR 101 | Temp 98.7°F | Resp 18

## 2016-12-21 DIAGNOSIS — Z95828 Presence of other vascular implants and grafts: Secondary | ICD-10-CM

## 2016-12-21 DIAGNOSIS — Z5111 Encounter for antineoplastic chemotherapy: Secondary | ICD-10-CM

## 2016-12-21 DIAGNOSIS — C155 Malignant neoplasm of lower third of esophagus: Secondary | ICD-10-CM | POA: Diagnosis not present

## 2016-12-21 DIAGNOSIS — C153 Malignant neoplasm of upper third of esophagus: Secondary | ICD-10-CM

## 2016-12-21 LAB — CBC WITH DIFFERENTIAL/PLATELET
BASO%: 0.3 % (ref 0.0–2.0)
Basophils Absolute: 0 10*3/uL (ref 0.0–0.1)
EOS%: 7.2 % — AB (ref 0.0–7.0)
Eosinophils Absolute: 0.5 10*3/uL (ref 0.0–0.5)
HEMATOCRIT: 36.8 % — AB (ref 38.4–49.9)
HEMOGLOBIN: 11.6 g/dL — AB (ref 13.0–17.1)
LYMPH#: 0.7 10*3/uL — AB (ref 0.9–3.3)
LYMPH%: 9.7 % — ABNORMAL LOW (ref 14.0–49.0)
MCH: 26.9 pg — ABNORMAL LOW (ref 27.2–33.4)
MCHC: 31.5 g/dL — ABNORMAL LOW (ref 32.0–36.0)
MCV: 85.4 fL (ref 79.3–98.0)
MONO#: 0.6 10*3/uL (ref 0.1–0.9)
MONO%: 7.3 % (ref 0.0–14.0)
NEUT%: 75.5 % — ABNORMAL HIGH (ref 39.0–75.0)
NEUTROS ABS: 5.7 10*3/uL (ref 1.5–6.5)
Platelets: 136 10*3/uL — ABNORMAL LOW (ref 140–400)
RBC: 4.31 10*6/uL (ref 4.20–5.82)
RDW: 15.7 % — AB (ref 11.0–14.6)
WBC: 7.5 10*3/uL (ref 4.0–10.3)

## 2016-12-21 MED ORDER — DEXAMETHASONE SODIUM PHOSPHATE 10 MG/ML IJ SOLN
INTRAMUSCULAR | Status: AC
  Filled 2016-12-21: qty 1

## 2016-12-21 MED ORDER — FAMOTIDINE IN NACL 20-0.9 MG/50ML-% IV SOLN
INTRAVENOUS | Status: AC
  Filled 2016-12-21: qty 50

## 2016-12-21 MED ORDER — DEXAMETHASONE SODIUM PHOSPHATE 10 MG/ML IJ SOLN
10.0000 mg | Freq: Once | INTRAMUSCULAR | Status: AC
  Administered 2016-12-21: 10 mg via INTRAVENOUS

## 2016-12-21 MED ORDER — DIPHENHYDRAMINE HCL 50 MG/ML IJ SOLN
25.0000 mg | Freq: Once | INTRAMUSCULAR | Status: AC
  Administered 2016-12-21: 25 mg via INTRAVENOUS

## 2016-12-21 MED ORDER — SODIUM CHLORIDE 0.9% FLUSH
10.0000 mL | INTRAVENOUS | Status: DC | PRN
  Administered 2016-12-21: 10 mL
  Filled 2016-12-21: qty 10

## 2016-12-21 MED ORDER — HEPARIN SOD (PORK) LOCK FLUSH 100 UNIT/ML IV SOLN
500.0000 [IU] | Freq: Once | INTRAVENOUS | Status: DC | PRN
  Filled 2016-12-21: qty 5

## 2016-12-21 MED ORDER — DIPHENHYDRAMINE HCL 50 MG/ML IJ SOLN
INTRAMUSCULAR | Status: AC
Start: 2016-12-21 — End: 2016-12-21
  Filled 2016-12-21: qty 1

## 2016-12-21 MED ORDER — FAMOTIDINE IN NACL 20-0.9 MG/50ML-% IV SOLN
20.0000 mg | Freq: Once | INTRAVENOUS | Status: AC
  Administered 2016-12-21: 20 mg via INTRAVENOUS

## 2016-12-21 MED ORDER — PACLITAXEL CHEMO INJECTION 300 MG/50ML
80.0000 mg/m2 | Freq: Once | INTRAVENOUS | Status: AC
  Administered 2016-12-21: 162 mg via INTRAVENOUS
  Filled 2016-12-21: qty 27

## 2016-12-21 MED ORDER — SODIUM CHLORIDE 0.9 % IJ SOLN
10.0000 mL | INTRAMUSCULAR | Status: DC | PRN
  Administered 2016-12-21: 10 mL via INTRAVENOUS
  Filled 2016-12-21: qty 10

## 2016-12-21 MED ORDER — HEPARIN SOD (PORK) LOCK FLUSH 100 UNIT/ML IV SOLN
500.0000 [IU] | Freq: Once | INTRAVENOUS | Status: AC | PRN
  Administered 2016-12-21: 500 [IU]
  Filled 2016-12-21: qty 5

## 2016-12-21 MED ORDER — SODIUM CHLORIDE 0.9 % IV SOLN
Freq: Once | INTRAVENOUS | Status: AC
  Administered 2016-12-21: 09:00:00 via INTRAVENOUS

## 2016-12-21 NOTE — Progress Notes (Signed)
Pt ok to treat without Cmet today

## 2016-12-21 NOTE — Patient Instructions (Signed)
[facility] Discharge Instructions for Patients Receiving Chemotherapy  Today you received the following chemotherapy agents Taxol   To help prevent nausea and vomiting after your treatment, we encourage you to take your nausea medication as directed.   If you develop nausea and vomiting that is not controlled by your nausea medication, call the clinic.   BELOW ARE SYMPTOMS THAT SHOULD BE REPORTED IMMEDIATELY:  *FEVER GREATER THAN 100.5 F  *CHILLS WITH OR WITHOUT FEVER  NAUSEA AND VOMITING THAT IS NOT CONTROLLED WITH YOUR NAUSEA MEDICATION  *UNUSUAL SHORTNESS OF BREATH  *UNUSUAL BRUISING OR BLEEDING  TENDERNESS IN MOUTH AND THROAT WITH OR WITHOUT PRESENCE OF ULCERS  *URINARY PROBLEMS  *BOWEL PROBLEMS  UNUSUAL RASH Items with * indicate a potential emergency and should be followed up as soon as possible.  Feel free to call the clinic you have any questions or concerns. The clinic phone number is [phone].  Please show the CHEMO ALERT CARD at check-in to the Emergency Department and triage nurse.   

## 2016-12-21 NOTE — Patient Instructions (Signed)

## 2016-12-21 NOTE — Progress Notes (Signed)
OK to treat today using CMET from 1/4 per Dr. Benay Spice

## 2016-12-24 ENCOUNTER — Other Ambulatory Visit: Payer: Self-pay | Admitting: Oncology

## 2016-12-26 ENCOUNTER — Telehealth: Payer: Self-pay | Admitting: *Deleted

## 2016-12-26 MED ORDER — HYDROCODONE-ACETAMINOPHEN 7.5-325 MG/15ML PO SOLN: 10.0000 mL | Freq: Four times a day (QID) | ORAL | 0 refills | Status: DC | PRN

## 2016-12-26 MED FILL — HYDROCOD-APAP 7.5-325/15ML: 7.5-325 | 6 days supply | Qty: 240 | Fill #0

## 2016-12-26 NOTE — Telephone Encounter (Signed)
Message from pt requesting refill on Hydrocodone. Reports PCP is referring him for cortisone injection for his back pain. Dr. Benay Spice made aware.

## 2016-12-28 ENCOUNTER — Ambulatory Visit (HOSPITAL_BASED_OUTPATIENT_CLINIC_OR_DEPARTMENT_OTHER): Payer: BLUE CROSS/BLUE SHIELD | Admitting: Nurse Practitioner

## 2016-12-28 ENCOUNTER — Ambulatory Visit (HOSPITAL_COMMUNITY)
Admission: RE | Admit: 2016-12-28 | Discharge: 2016-12-28 | Disposition: A | Discharge status: Home / Self Care | Payer: BLUE CROSS/BLUE SHIELD | Source: Ambulatory Visit | Attending: Nurse Practitioner | Admitting: Nurse Practitioner

## 2016-12-28 ENCOUNTER — Ambulatory Visit: Payer: BLUE CROSS/BLUE SHIELD

## 2016-12-28 ENCOUNTER — Telehealth: Payer: Self-pay

## 2016-12-28 ENCOUNTER — Encounter (HOSPITAL_COMMUNITY): Payer: Self-pay

## 2016-12-28 ENCOUNTER — Other Ambulatory Visit (HOSPITAL_BASED_OUTPATIENT_CLINIC_OR_DEPARTMENT_OTHER): Payer: BLUE CROSS/BLUE SHIELD

## 2016-12-28 VITALS — BP 128/85 | HR 108 | Temp 98.9°F | Resp 18 | Ht 73.0 in | Wt 170.1 lb

## 2016-12-28 DIAGNOSIS — C155 Malignant neoplasm of lower third of esophagus: Secondary | ICD-10-CM

## 2016-12-28 DIAGNOSIS — C7971 Secondary malignant neoplasm of right adrenal gland: Secondary | ICD-10-CM

## 2016-12-28 DIAGNOSIS — X58XXXA Exposure to other specified factors, initial encounter: Secondary | ICD-10-CM | POA: Diagnosis not present

## 2016-12-28 DIAGNOSIS — C7970 Secondary malignant neoplasm of unspecified adrenal gland: Secondary | ICD-10-CM | POA: Diagnosis not present

## 2016-12-28 DIAGNOSIS — Z95828 Presence of other vascular implants and grafts: Secondary | ICD-10-CM

## 2016-12-28 DIAGNOSIS — C7951 Secondary malignant neoplasm of bone: Secondary | ICD-10-CM | POA: Diagnosis not present

## 2016-12-28 DIAGNOSIS — M8448XA Pathological fracture, other site, initial encounter for fracture: Secondary | ICD-10-CM | POA: Diagnosis not present

## 2016-12-28 DIAGNOSIS — M545 Low back pain: Secondary | ICD-10-CM

## 2016-12-28 LAB — CBC WITH DIFFERENTIAL/PLATELET
BASO%: 0.9 % (ref 0.0–2.0)
BASOS ABS: 0 10*3/uL (ref 0.0–0.1)
EOS%: 9.6 % — ABNORMAL HIGH (ref 0.0–7.0)
Eosinophils Absolute: 0.4 10*3/uL (ref 0.0–0.5)
HCT: 35.5 % — ABNORMAL LOW (ref 38.4–49.9)
HGB: 11.2 g/dL — ABNORMAL LOW (ref 13.0–17.1)
LYMPH%: 15.4 % (ref 14.0–49.0)
MCH: 26.7 pg — AB (ref 27.2–33.4)
MCHC: 31.5 g/dL — ABNORMAL LOW (ref 32.0–36.0)
MCV: 84.5 fL (ref 79.3–98.0)
MONO#: 0.2 10*3/uL (ref 0.1–0.9)
MONO%: 3.8 % (ref 0.0–14.0)
NEUT%: 70.3 % (ref 39.0–75.0)
NEUTROS ABS: 3.2 10*3/uL (ref 1.5–6.5)
Platelets: 144 10*3/uL (ref 140–400)
RBC: 4.2 10*6/uL (ref 4.20–5.82)
RDW: 15.8 % — ABNORMAL HIGH (ref 11.0–14.6)
WBC: 4.5 10*3/uL (ref 4.0–10.3)
lymph#: 0.7 10*3/uL — ABNORMAL LOW (ref 0.9–3.3)

## 2016-12-28 MED ORDER — SODIUM CHLORIDE 0.9% FLUSH
10.0000 mL | INTRAVENOUS | Status: DC | PRN
  Administered 2016-12-28: 10 mL via INTRAVENOUS
  Filled 2016-12-28: qty 10

## 2016-12-28 MED ORDER — SODIUM CHLORIDE 0.9 % IJ SOLN
10.0000 mL | INTRAMUSCULAR | Status: DC | PRN
  Administered 2016-12-28: 10 mL via INTRAVENOUS
  Filled 2016-12-28: qty 10

## 2016-12-28 MED ORDER — HYDROMORPHONE HCL 4 MG/ML IJ SOLN
1.0000 mg | Freq: Once | INTRAMUSCULAR | Status: AC
Start: 2016-12-28 — End: 2016-12-28
  Administered 2016-12-28: 1 mg via INTRAVENOUS

## 2016-12-28 MED ORDER — HYDROMORPHONE HCL 2 MG PO TABS: 2.0000 mg | ORAL_TABLET | ORAL | 0 refills | Status: DC | PRN

## 2016-12-28 MED ORDER — HYDROMORPHONE HCL 4 MG/ML IJ SOLN
1.0000 mg | Freq: Once | INTRAMUSCULAR | Status: AC
  Administered 2016-12-28: 1 mg via INTRAVENOUS

## 2016-12-28 MED ORDER — HYDROMORPHONE HCL 4 MG/ML IJ SOLN
INTRAMUSCULAR | Status: AC
  Filled 2016-12-28: qty 1

## 2016-12-28 MED ORDER — IOPAMIDOL (ISOVUE-300) INJECTION 61%
INTRAVENOUS | Status: AC
  Filled 2016-12-28: qty 75

## 2016-12-28 MED ORDER — HEPARIN SOD (PORK) LOCK FLUSH 100 UNIT/ML IV SOLN
500.0000 [IU] | Freq: Once | INTRAVENOUS | Status: AC
  Administered 2016-12-28: 500 [IU] via INTRAVENOUS
  Filled 2016-12-28: qty 5

## 2016-12-28 MED ORDER — IOPAMIDOL (ISOVUE-300) INJECTION 61%
75.0000 mL | Freq: Once | INTRAVENOUS | Status: AC | PRN
  Administered 2016-12-28: 75 mL via INTRAVENOUS

## 2016-12-28 MED ORDER — IOPAMIDOL (ISOVUE-300) INJECTION 61%: 100.0000 mL | Freq: Once | INTRAVENOUS | Status: DC | PRN

## 2016-12-28 MED ORDER — HYDROMORPHONE HCL 1 MG/ML PO LIQD: 2.0000 mg | Freq: Four times a day (QID) | ORAL | 0 refills | Status: DC | PRN

## 2016-12-28 MED FILL — HYDROMORPHONE 5 MG/5 ML SOL: 1 | 3 days supply | Qty: 60 | Fill #0

## 2016-12-28 NOTE — Telephone Encounter (Signed)
Called patients wife to see if she can pick pt up today- pt has received pain meds in office d/t severe pain. Mrs. okane states she will come get patient, informed her we were trying to get an MRI done and will call her back with more information

## 2016-12-28 NOTE — Patient Instructions (Signed)

## 2016-12-28 NOTE — Telephone Encounter (Signed)
Called patients wife to inform her patient was back from CT scan and just waiting for results to be reviewed. Mrs. Blissett states she will come up to get pt.

## 2016-12-28 NOTE — Progress Notes (Addendum)
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 He was treated with Taxol 12/21/2016. He denies nausea/vomiting. No mouth sores. He has been constipated. He thinks the constipation is related to pain medication. Neuropathy symptoms are stable to improved. He reports increased low back pain. The pain radiates to the bilateral groin regions intermittently. He denies leg weakness or numbness. No bladder dysfunction. As noted above, some constipation.  Objective:  Vital signs in last 24 hours:  Blood pressure 128/85, pulse (!) 108, temperature 98.9 F (37.2 C), temperature source Oral, resp. rate 18, height '6\' 1"'$  (1.854 m), weight 170 lb 1.6 oz (77.2 kg), SpO2 98 %.    HEENT: No thrush or ulcers. Resp: Lungs clear bilaterally. Cardio: Regular rate and rhythm. GI: Abdomen soft with mild generalized tenderness. No hepatomegaly. Vascular: Trace bilateral pretibial edema. Calves soft and nontender. Neuro: Alert and oriented. Decreased strength right upper leg appears to be related to pain. Leg strength otherwise intact.  Musculoskeletal: Tenderness at the lumbar spine. Port-A-Cath without erythema.   Lab Results:  Lab Results  Component Value Date   WBC 4.5 12/28/2016   HGB 11.2 (L) 12/28/2016   HCT 35.5 (L) 12/28/2016   MCV 84.5 12/28/2016   PLT 144 12/28/2016   NEUTROABS 3.2 12/28/2016    Imaging:  No results found.  Medications: I have reviewed the patient's current medications.  Assessment/Plan: 1.Adenocarcinoma of the distal esophagus/gastric cardia-status post an endoscopic biopsy on 03/18/2013 confirming adenocarcinoma, HER-2/neunot amplified  -Staging CT scans 03/20/2013 confirmed a distal esophageal/gastric cardia mass, distal paraesophageal lymph nodes and paratracheal nodes concerning for metastatic lymphadenopathy  -A PET scan 03/27/2013 with a multifocal area of  hypermetabolic activity involving the thoracic esophagus extending into the gastric cardia and hypermetabolic mediastinal nodes, no distant metastatic disease  -Initiation of radiation 04/07/2013 and weekly Taxol/carboplatin 04/08/2013, radiation completed 05/15/2013  -Restaging PET scan 06/09/2013-no evidence of metastatic disease, no hypermetabolic lymphadenopathy  -Esophagogastrectomy 07/07/2013 revealed a ypT1b,ypN0 tumor with negative surgical margins  -CT neck 06/09/2014 revealed a necrotic lymph node in the right tracheoesophageal groove  -PET scan 11/15/2014 revealed a single focus of hypermetabolism corresponding to the necrotic right upper mediastinum lymph node -EUS biopsy of the paraesophageal lymph node 06/25/2012 confirmed metastatic adenocarcinoma  -Radiation completed 08/11/2014 -Rising CEA May 2016 -PET scan 07/13/2015 with hypermetabolic right paratracheal and right hilar adenopathy -Cycle 1 FOLFOX 08/03/2015 -Cycle 2 FOLFOX 08/17/2015 - cycle 3 FOLFOX 08/31/2015 (oxaliplatin dose reduced secondary to thrombocytopenia)  -Cycle 4 FOLFOX 09/14/2015 -Cycle 5 FOLFOX 09/28/2015 - restaging CTs 09/30/2015 with stable disease involving neck/chest lymph nodes, a slight increase in size of left upper lobe lung nodule  - cycle 6 FOLFOX 10/12/2015  -FOLFOX discontinued secondary to development of bilateral vocal cord paralysis November 2016 - CT scan of the neck and chest on 11/22/15 revealed no definite disease in the neck. At the thoracic inlet region, there is questionably slight increase in prominence of soft tissue at the right tracheoesophageal groove. This could be an enlarging node or mass but could also relate to the esophagectomy and pull-through surgery.Enlarging lingular and left lower lobe nodules are worrisome for metastatic disease. Peribronchovascular nodular consolidation in the anterior right lower lobe, slightly more prominent, indeterminate. Cholelithiasis. Left  diaphragmatic hernia, stable. -Xeloda 1 week on/1 week off beginning 12/11/2015 -Restaging CTs of the neck and chest 02/07/2016 revealed enlargement of 2 left-sided lung nodules, no other evidence of disease progression -  Cycle 1 Pembrolizumab 02/25/2016  -Cycle 2 Pembrolizumab 03/17/2016 -Cycle 3 Pembrolizumab 04/07/2016   -CT 04/20/2016 consistent with progressive disease involving a large pericardial effusion, bilateral pleural effusions, enlarged mediastinal lymph nodes and lung nodules  Cytology from the pericardial fluid 04/21/2016-positive for metastatic adenocarcinoma -CT 05/01/2016 was slight worsening of right hilar lymphadenopathy with near complete collapse of the right middle lobe, nodular metastases in the left lung, stable bilateral pleural effusions  Cycle 1 Taxol/ramucirumab 05/12/2016; day 8 Taxol 05/19/2016; day 15 Taxol/ramucirumab 05/26/2016.  Cycle 2 day 1 Taxol/ramucirumab 06/22/2016  Cycle 2 day 8 Taxol 06/29/2016   Cycle 2 day 15 Taxol/ramucirumab07/28/2017  Cycle 3 day 1 Taxol/ramucirumab 07/20/2016  Cycle 3 day 8 Taxol 07/27/2016  Cycle 3 day 15 Taxol/ramucirumab08/24/2017  Restaging chest CT 08/10/2016-left upper lobe pulmonary nodule similar; left lower lobe pulmonary nodule may have enlarged minimally; improved appearance of the mediastinum and right infrahilar region with decreased soft tissue fullness; improvement in probable left infrahilar adenopathy; decreased left and resolved right pleural effusions; developing right adrenal nodularity; T7 vertebral body lesion suspicious for osseous metastasis.  Cycle 4 Taxol/ramucirumab09/06/2016   cycle 5 Taxol/ramucirumab10/03/2016  restaging chest CT 10/05/2016 with slight enlargement of mediastinal lymph nodes and pulmonary nodules, enlargement of left adrenal lesion,? Lymphatic tumor spread in the right lung  Cycle 6 Taxol/ ramucirumab11/01/2016; Taxol placed on hold beginning 10/26/2016 due to  neuropathy symptoms, continuation of ramucirumab alone  Cycle 7Taxol/ ramucirumab11/30/2017-Taxol remains on hold   restaging chest CT 12/08/2016-minimal increase in size of a left hilar node and left lung nodule. Mild increase in the size of a right adrenal metastasis  Cycle 8 Taxol/ ramucirumab 12/14/2016-Taxol resumed 12/21/2016  CT lumbar spine 12/28/2016 with infiltrative tumor at L3 with mild L3 pathologic fracture. S2 lytic lesion/metastasis. New 3.5 x 2.3 cm right adrenal mass.  Course of palliative radiation planned  2. Indeterminate 15 mm right liver lesion on the CT scan 03/20/2013, MRI of the liver 06/09/2013 confirmed a right liver hemangioma  3. Anorexia/weight loss and solid dysphagia secondary to #1 -improved 4. History of a colon polyp, status post a polypectomy October 2013  5. history of Thrombocytopenia secondary chemotherapy-the carboplatin wase dose reduced with week 5 of chemotherapy chemotherapy  6. hoarseness secondary to right vocal cord paralysis-CT/PET scan imaging consistent with a malignant right paratracheal lymph node causing the vocal cord paralysis  -Status post a laryngeal plasty at [facility] on 09/02/2014 7. Esophageal stricture-status post dilation 06/08/2015 -Food impaction removal 09/29/2015 -Esophageal stricture dilation 10/06/2015 8. Port-A-Cath placement 07/20/2015 9. Delayed nausea following cycle 1 FOLFOX. Aloxi and Emend added with cycle 2. 10. Thrombocytopenia secondary to chemotherapy  11. Emotional lability/depression-prophylactic Decadron discontinued; Remeron initiated 12. Progressive hoarseness November 2016, diagnosed with bilateral vocal cord paralysis, status post a tracheostomy and bilateral injection laryngoplasty at [facility] on 10/27/2015 13. Pericardial window 04/21/2016  14. High fever 07/06/2016-chest x-ray suggestive of possible left lower lobe pneumonia-treated with Augmentin  15. Neuropathy symptoms involving the hands and feet  likely secondary to Taxol. Taxol placed on hold beginning 10/26/2016.Lyrica added 11/23/2016 16. Depression-Remeron dose increased to 30 mg 11/23/2016 17. Back pain-potentially related to progressive esophagus cancer; CT 11/28/2016 with no bone lesion, bony distraction or fracture. Moderately severe lumbar facet arthritis greatest at L4-5 on the right. Good preservation of the intravertebral disc spaces. Stable, degenerative appearing, minimal anterolisthesis at L4-5. CT lumbar spine 12/28/2016-infiltrative tumor L3 with mild L3 pathologic fracture. S2 lytic lesion/metastasis. New 3.5 x 2.3 cm adrenal mass consistent with metastasis. Tumor mildly  narrows the right L3-4 neural foramen.   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 He is on active treatment with Taxol/ramucirumab. Taxol was resumed last week.   He presents today with worsening low back pain, lumbar region. He had a CT scan 11/28/2016 with no acute findings. We initially planned on obtaining an MRI of the lumbar spine. Due to the trach he apparently cannot have an MRI. We referred him for a stat CT scan of the lumbar spine which showed infiltrative tumor at L3 with mild L3 pathologic fracture. There was a new 3.5 x 2.3 cm right adrenal metastasis.  Dr. Benay Spice reviewed the CT results with [name] and his wife. They understand the CT scan shows evidence of disease progression. The current chemotherapy will be discontinued. We made a referral to radiation oncology. He was given a prescription for hydromorphone 1 mg/mL with instructions to take 2-4 mg every 6 hours as needed for pain. He understands he should not be driving while taking pain medication.  Dr. Benay Spice discussed a supportive care approach versus a trial of FOLFOX chemotherapy following completion of radiation. [name] initial thought is to proceed with chemotherapy. He will return for a follow-up visit on 01/11/2017 for additional discussion. He will  contact the office in the interim with any problems.    Patient seen with Dr. Benay Spice. CT images reviewed on the computer with [name] by Dr. Benay Spice. 45 minutes were spent face-to-face at today's visit with the majority of that time involved in counseling/coordination of care.  Mikael, [place] ANP/GNP-BC   12/28/2016  9:00 AM  This was a shared visit with Ned Card. Dr.Mccallister was interviewed and examined. I reviewed the recent pelvic and chest CT images with a radiologist earlier today. We reviewed the lumbar CT images with [name] and his wife. There is progressive metastatic disease involving The right adrenal gland and lumbosacral spine. He appears to be symptomatic from metastatic disease involving L3. I contacted Dr. Lisbeth Renshaw and he will be seen for palliative radiation on 12/29/2016.  The Taxol/ ramucirumab will be discontinued. He understands the small chance of clinical benefit with additional chemotherapy. He would like to consider salvage chemotherapy. He will return for an office visit and further discussion near the completion of radiation. We discussed hospice care.  Julieanne Manson, M.D.

## 2016-12-28 NOTE — Progress Notes (Signed)
Called MRI to see if patient could have MRI done with trach- Spoke with Marjory Lies who states patient has metal trach and is unable to do MRI- also noted in 10/06/16 note from [employer]. Attempted to call Dr. Otelia Santee office to see about other options, left message on triage line.

## 2016-12-29 ENCOUNTER — Ambulatory Visit
Admission: RE | Admit: 2016-12-29 | Discharge: 2016-12-29 | Disposition: A | Discharge status: Home / Self Care | Payer: BC Managed Care – PPO | Source: Ambulatory Visit | Attending: Radiation Oncology | Admitting: Radiation Oncology

## 2016-12-29 ENCOUNTER — Telehealth: Payer: Self-pay

## 2016-12-29 DIAGNOSIS — Z510 Encounter for antineoplastic radiation therapy: Secondary | ICD-10-CM | POA: Diagnosis present

## 2016-12-29 DIAGNOSIS — C7951 Secondary malignant neoplasm of bone: Secondary | ICD-10-CM | POA: Diagnosis not present

## 2016-12-29 NOTE — Telephone Encounter (Signed)
Nurse from Dr. Lucinda Dell office called back stating for future pt can have [plastic shiley placed for MRI and then metal one put back in after. She believes it is titanium but to be safe a plastic shiley is recommended.

## 2017-01-01 ENCOUNTER — Ambulatory Visit
Admission: RE | Admit: 2017-01-01 | Discharge: 2017-01-01 | Disposition: A | Discharge status: Home / Self Care | Payer: BC Managed Care – PPO | Source: Ambulatory Visit | Attending: Radiation Oncology | Admitting: Radiation Oncology

## 2017-01-01 ENCOUNTER — Telehealth: Payer: Self-pay | Admitting: *Deleted

## 2017-01-01 ENCOUNTER — Telehealth: Payer: Self-pay | Admitting: Nutrition

## 2017-01-01 DIAGNOSIS — Z510 Encounter for antineoplastic radiation therapy: Secondary | ICD-10-CM | POA: Diagnosis not present

## 2017-01-01 NOTE — Telephone Encounter (Signed)
Called patient home phone ,pt not there per son,called his cell phone, patient answered after 10 rings, he is parking the car and coming into the lobby shortly, he still needs to register, annd get a pger, then called [facility] therapist and infomred her that patient is just getting here late 1:05 PM

## 2017-01-01 NOTE — Telephone Encounter (Signed)
Patient requested to have appointment for 2/1 canceled.

## 2017-01-02 ENCOUNTER — Ambulatory Visit
Admission: RE | Admit: 2017-01-02 | Discharge: 2017-01-02 | Disposition: A | Discharge status: Home / Self Care | Payer: BC Managed Care – PPO | Source: Ambulatory Visit | Attending: Radiation Oncology | Admitting: Radiation Oncology

## 2017-01-02 ENCOUNTER — Telehealth: Payer: Self-pay | Admitting: *Deleted

## 2017-01-02 ENCOUNTER — Ambulatory Visit
Admission: RE | Admit: 2017-01-02 | Discharge: 2017-01-02 | Disposition: A | Discharge status: Home / Self Care | Payer: BLUE CROSS/BLUE SHIELD | Source: Ambulatory Visit | Attending: Radiation Oncology | Admitting: Radiation Oncology

## 2017-01-02 DIAGNOSIS — C155 Malignant neoplasm of lower third of esophagus: Secondary | ICD-10-CM

## 2017-01-02 DIAGNOSIS — Z510 Encounter for antineoplastic radiation therapy: Secondary | ICD-10-CM | POA: Diagnosis not present

## 2017-01-02 MED ORDER — BIAFINE EX EMUL
Freq: Every day | CUTANEOUS | Status: DC
  Administered 2017-01-02: 12:00:00 via TOPICAL

## 2017-01-02 NOTE — Progress Notes (Signed)
Pt education done, biafine cream, my business card, radiation therapy and you book given, discussed ways to manage [place],vomiting, diarrhea, loss appetite, skin irritation, fatigue,pain, USE BIAFINE DAILY TO RAD TX AREA AFTER TREATMENTS DAILY, may ned to eat smaller meals 5-6 and snacks,, low fiber diet if having diarrhea, take imodium prn, drink plenty high calorie drinks and water, increase protein in diet, teach back given, dor constipation, miralax, prn, merta metaucil, prn, teachback given 12:57 PM

## 2017-01-02 NOTE — Telephone Encounter (Signed)
Left message informing pt that DMV form is ready for pick up.

## 2017-01-03 ENCOUNTER — Telehealth: Payer: Self-pay | Admitting: *Deleted

## 2017-01-03 ENCOUNTER — Ambulatory Visit
Admission: RE | Admit: 2017-01-03 | Discharge: 2017-01-03 | Disposition: A | Discharge status: Home / Self Care | Payer: BLUE CROSS/BLUE SHIELD | Source: Ambulatory Visit | Attending: Radiation Oncology | Admitting: Radiation Oncology

## 2017-01-03 DIAGNOSIS — C155 Malignant neoplasm of lower third of esophagus: Secondary | ICD-10-CM

## 2017-01-03 DIAGNOSIS — Z510 Encounter for antineoplastic radiation therapy: Secondary | ICD-10-CM | POA: Diagnosis not present

## 2017-01-03 MED ORDER — HYDROMORPHONE HCL 1 MG/ML PO LIQD: 2.0000 mg | Freq: Four times a day (QID) | ORAL | 0 refills | Status: DC | PRN

## 2017-01-03 MED ORDER — HYDROCODONE-ACETAMINOPHEN 7.5-325 MG/15ML PO SOLN: 10.0000 mL | Freq: Four times a day (QID) | ORAL | 0 refills | Status: DC | PRN

## 2017-01-03 NOTE — Telephone Encounter (Signed)
Notified pt that prescriptions are ready for pick up.

## 2017-01-03 NOTE — Telephone Encounter (Signed)
Message from pt requesting refill of Hydrocodone and Hydromorphone. Requests to pick up while here for radiation today.

## 2017-01-04 ENCOUNTER — Ambulatory Visit
Admission: RE | Admit: 2017-01-04 | Discharge: 2017-01-04 | Disposition: A | Discharge status: Home / Self Care | Payer: BLUE CROSS/BLUE SHIELD | Source: Ambulatory Visit | Attending: Radiation Oncology | Admitting: Radiation Oncology

## 2017-01-04 ENCOUNTER — Telehealth: Payer: Self-pay | Admitting: *Deleted

## 2017-01-04 DIAGNOSIS — Z510 Encounter for antineoplastic radiation therapy: Secondary | ICD-10-CM | POA: Diagnosis not present

## 2017-01-04 DIAGNOSIS — C153 Malignant neoplasm of upper third of esophagus: Secondary | ICD-10-CM

## 2017-01-04 MED ORDER — CYCLOBENZAPRINE HCL 10 MG PO TABS: 10.0000 mg | ORAL_TABLET | Freq: Three times a day (TID) | ORAL | 1 refills | Status: DC | PRN

## 2017-01-04 MED FILL — HYDROMORPHONE 5 MG/5 ML SOL: 1 | 3 days supply | Qty: 60 | Fill #0

## 2017-01-04 MED FILL — HYDROCOD-APAP 7.5-325/15ML: 7.5-325 | 6 days supply | Qty: 240 | Fill #0

## 2017-01-04 MED FILL — CYCLOBENZAPRINE 10 MG TAB: 10 | 10 days supply | Qty: 30 | Fill #0

## 2017-01-04 NOTE — Telephone Encounter (Signed)
"  I want to update Dr. Benay Spice.  I'm in pain.  I hurt in the middle of my back and the lower back.  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  Pain is a constant ache all the time that is sharp with movement.  Spasms to back as well.  Rate pain to mid back as five on pain scale.  I use ibuprofen and pain medicines he prescribed brings pain down to two.  The lower back rates as ten and after medication down to a five.  Want to keep him informed and get refill on flexeril.  Will be in for today's radiation.    I'm also very constipated last BM was eight days ago.  I use OTC softener and laxative.  Some burning when I pee but I've had this a long time now."

## 2017-01-04 NOTE — Telephone Encounter (Signed)
Telephone call to patient. He has been using Senna, Ducolax once a day for the past 4 days. Pt was advised to try to Miralax on a more regular basis since he has also increased the amount of pain medication he has been taking the constipation is related to this. Pt states he understands to get this OTC and will call back if the problem persists.      Lumbar pain has increased. He is having spasms up to his middle back area. He states this is something entirely different and is convinced something else is going on since his last scan. He has not been able to rest at night like normal. With pain medication the lumbar area pain decreases but the mid back pain is constant. This concern forwarded to Dr. Hali Marry nurses.  Patient advised requested refills sent to the pharmacy.

## 2017-01-05 ENCOUNTER — Encounter: Payer: Self-pay | Admitting: Radiation Oncology

## 2017-01-05 ENCOUNTER — Ambulatory Visit
Admission: RE | Admit: 2017-01-05 | Discharge: 2017-01-05 | Disposition: A | Discharge status: Home / Self Care | Payer: BLUE CROSS/BLUE SHIELD | Source: Ambulatory Visit | Attending: Radiation Oncology | Admitting: Radiation Oncology

## 2017-01-05 ENCOUNTER — Telehealth: Payer: Self-pay | Admitting: Oncology

## 2017-01-05 DIAGNOSIS — Z510 Encounter for antineoplastic radiation therapy: Secondary | ICD-10-CM | POA: Diagnosis not present

## 2017-01-05 DIAGNOSIS — C7951 Secondary malignant neoplasm of bone: Secondary | ICD-10-CM | POA: Insufficient documentation

## 2017-01-05 NOTE — Telephone Encounter (Signed)
Follow up appointment with Dr Benay Spice was added to 01/11/17 appointments, per 12/28/16 los.

## 2017-01-05 NOTE — Progress Notes (Signed)
Department of Radiation Oncology  Phone:  [phone] Fax:        [phone]  Weekly Treatment Note    Name: [name], Dr. Date: 01/05/2017 MRN: [id number] DOB: 29-Mar-1966   Diagnosis:     ICD-9-CM ICD-10-CM   1. Bone metastasis ([facility]) 198.5 C79.51      Current dose: 15 Gy  Current fraction: 5   MEDICATIONS: Current Outpatient Prescriptions  Medication Sig Dispense Refill  . acetaminophen (TYLENOL) 500 MG tablet Take 1,000 mg by mouth every 6 (six) hours as needed for mild pain or moderate pain.    Marland Kitchen ALPRAZolam (XANAX) 0.5 MG tablet TAKE 1 TABLET BY MOUTH 3 TIMES DAILY AS NEEDED FOR ANXIETY 60 tablet 0  . Ascorbic Acid (VITAMIN C) 1000 MG tablet Take 1,000 mg by mouth 3 (three) times daily.    . benzonatate (TESSALON) 100 MG capsule Take 1 capsule (100 mg total) by mouth 3 (three) times daily as needed for cough. 90 capsule 1  . cholecalciferol (VITAMIN D) 1000 units tablet Take 1,000 Units by mouth daily.    . cyclobenzaprine (FLEXERIL) 10 MG tablet Take 1 tablet (10 mg total) by mouth 3 (three) times daily as needed for muscle spasms. 30 tablet 1  . emollient (BIAFINE) cream Apply 1 application topically daily.    Marland Kitchen HYDROcodone-acetaminophen (HYCET) 7.5-325 mg/15 ml solution Place 10 mLs into feeding tube every 6 (six) hours as needed for moderate pain. 240 mL 0  . HYDROmorphone HCl (DILAUDID) 1 MG/ML LIQD Take 2-4 mLs (2-4 mg total) by mouth every 6 (six) hours as needed for severe pain. 60 mL 0  . ibuprofen (ADVIL,MOTRIN) 200 MG tablet Take 400 mg by mouth every 8 (eight) hours as needed for moderate pain. Reported on 05/12/2016    . lactase (LACTAID) 3000 UNITS tablet Take 3,000 Units by mouth daily as needed (lactose intolerance). Reported on 05/12/2016    . lidocaine-prilocaine (EMLA) cream Apply 1 application topically as needed. Apply tablespoon to PAC 2 hours prior to stick and cover with plastic wrap 30 g 5  . mirtazapine (REMERON) 30 MG tablet Take 1  tablet (30 mg total) by mouth at bedtime. 30 tablet 0  . pregabalin (LYRICA) 50 MG capsule Take 1 capsule (50 mg total) by mouth 2 (two) times daily. 60 capsule 0  . sennosides-docusate sodium (SENOKOT-S) 8.6-50 MG tablet Take 1 tablet by mouth daily.    Marland Kitchen zolpidem (AMBIEN) 5 MG tablet Take 1 tablet (5 mg total) by mouth at bedtime as needed for sleep. 30 tablet 0   No current facility-administered medications for this encounter.      ALLERGIES: Lactose intolerance (gi)   LABORATORY DATA:  Lab Results  Component Value Date   WBC 4.5 12/28/2016   HGB 11.2 (L) 12/28/2016   HCT 35.5 (L) 12/28/2016   MCV 84.5 12/28/2016   PLT 144 12/28/2016   Lab Results  Component Value Date   NA 142 12/14/2016   K 3.9 12/14/2016   CL 101 12/08/2016   CO2 26 12/14/2016   Lab Results  Component Value Date   ALT <6 12/14/2016   AST 11 12/14/2016   ALKPHOS 92 12/14/2016   BILITOT 0.34 12/14/2016     NARRATIVE: Joel Osborne, Dr. was seen today for weekly treatment management. The chart was checked and the patient's films were reviewed.  The patient is complaining of some ongoing pain. No significant improvement in the lower back as of yet. The patient is taking  wanted medication. He does also complain of some lesser pain in the sacral region as well as higher in the T7 level region. His CT scans through this area did show lytic lesions, 1 cm in the T7 area and less than 1 cm in the sacrum at the S2 level. We discussed possible treatment to the sites which certainly would be feasible. The patient wished to hold off on any planning right now but will let us know how he is doing next week in this regard.  PHYSICAL EXAMINATION: weight is 167 lb (75.8 kg). His oral temperature is 98.2 F (36.8 C). His blood pressure is 126/79 and his pulse is 111 (abnormal). His respiration is 20 and oxygen saturation is 99%.        ASSESSMENT: The patient is doing satisfactorily with treatment.  PLAN: We will  continue with the patient's radiation treatment as planned.

## 2017-01-05 NOTE — Progress Notes (Signed)
Weekly rad txs L2-L4 5/10 completed, low back pain, takes dilaudid  Which helps, c/o nausea and constipation, took miralax,  To help had abm today , very weak,  And fatigyed 12:08 PM BP 126/79 (BP Location: Left Arm, Patient Position: Sitting, Cuff Size: Normal)   Pulse (!) 111   Temp 98.2 F (36.8 C) (Oral)   Resp 20   Wt 167 lb (75.8 kg)   SpO2 99% Comment: room air  BMI 22.03 kg/m   Wt Readings from Last 3 Encounters:  01/05/17 167 lb (75.8 kg)  12/28/16 170 lb 1.6 oz (77.2 kg)  12/14/16 175 lb 4.8 oz (79.5 kg)

## 2017-01-05 NOTE — Progress Notes (Signed)
  Radiation Oncology         [id number]) [phone] ________________________________  Name: Joel Osborne, Dr. MRN: [id number]  Date: 12/29/2016  DOB: 08-04-1966  SIMULATION AND TREATMENT PLANNING NOTE  DIAGNOSIS:     ICD-9-CM ICD-10-CM   1. Bone metastasis ([place]) 198.5 C79.51      Site:  L2-4  NARRATIVE:  The patient was brought to the [facility].  Identity was confirmed.  All relevant records and images related to the planned course of therapy were reviewed.   Written consent to proceed with treatment was confirmed which was freely given after reviewing the details related to the planned course of therapy had been reviewed with the patient.  Then, the patient was set-up in a stable reproducible  supine position for radiation therapy.  CT images were obtained.  Surface markings were placed.    Medically necessary complex treatment device(s) for immobilization:  Customized vac lock bag  The CT images were loaded into the planning software.  Then the target and avoidance structures were contoured.  Treatment planning then occurred.  The radiation prescription was entered and confirmed.  A total of  to complex treatment devices were fabricated which relate to the designed radiation treatment fields. Each of these customized fields/ complex treatment devices will be used on a daily basis during the radiation course. I have requested : 3D Simulation  I have requested a DVH of the following structures: Target volume, left kidney, right kidney, spinal cord .   PLAN:  The patient will receive 30 Gy in  10 fractions.  ________________________________   [name], MD, PhD

## 2017-01-07 IMAGING — CT CT CHEST W/ CM
2 of 3 series · 14 of 36 positions shown, 17 images · IV contrast (iopamidol)
Comparison: 08/10/2016 chest CT.

CLINICAL DATA: Upper third esophageal cancer diagnosed in 9790 with
a history of T7 spinal metastasis, now presenting with left scapula/
shoulder pain.

EXAM:
CT CHEST WITH CONTRAST
TECHNIQUE: Multidetector CT imaging of the chest was performed during
intravenous contrast administration.
CONTRAST:  75mL [id number] IOPAMIDOL ([id number]) INJECTION 61%

[Series 2: chest with st · axial · 0.82mm/px · z∈[+1324,+1600]mm · 11 of 163 slices shown, 14 images]
[im 13/163  mediastinal]
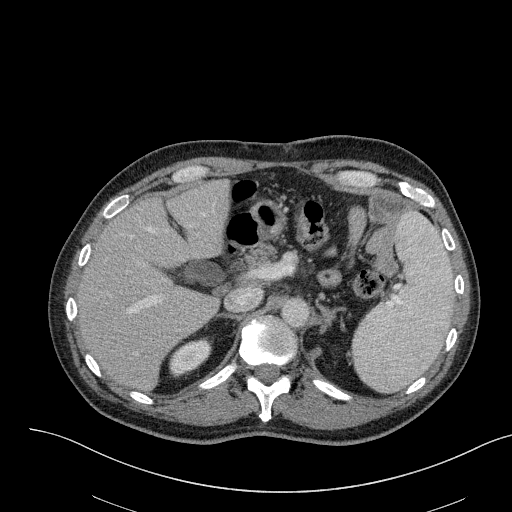
[im 13/163  lung]
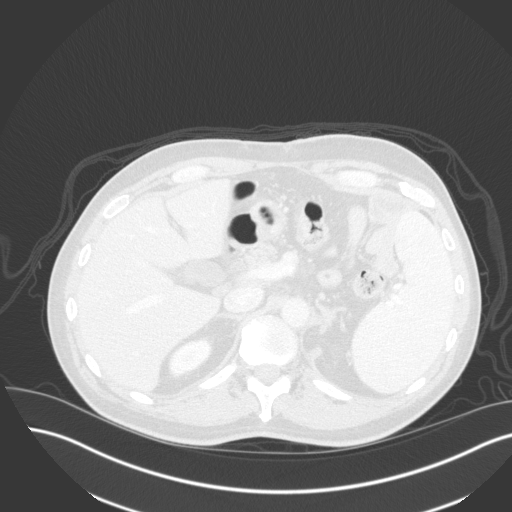
[im 25/163  lung]
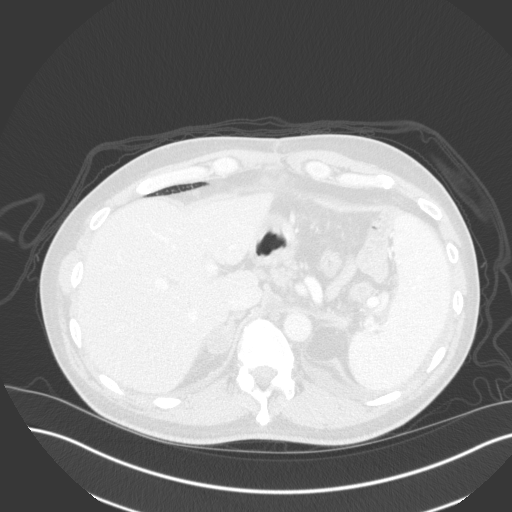
[im 37/163  lung]
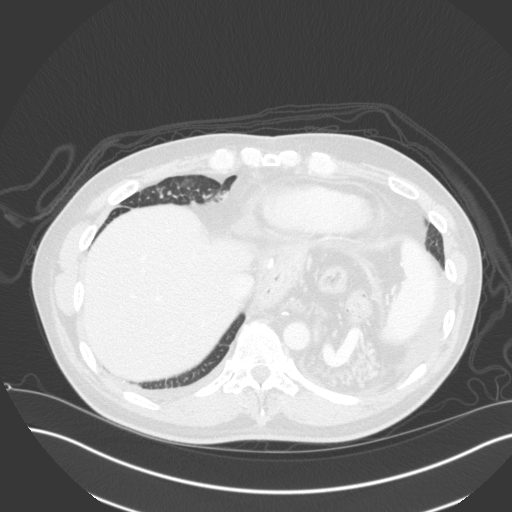
[im 55/163  lung]
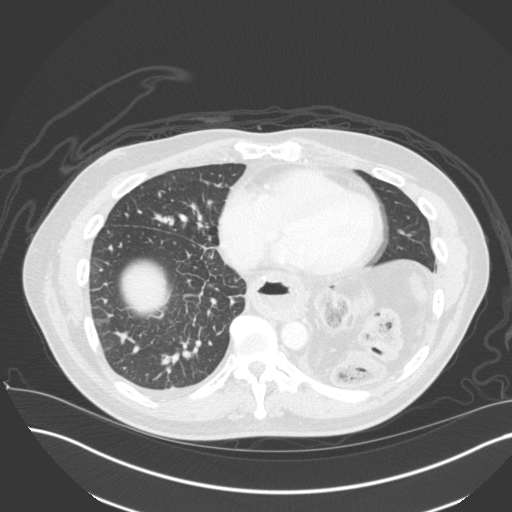
[im 67/163  mediastinal]
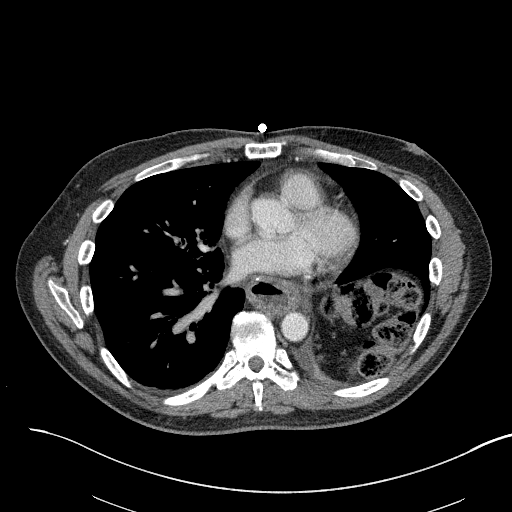
[im 67/163  lung]
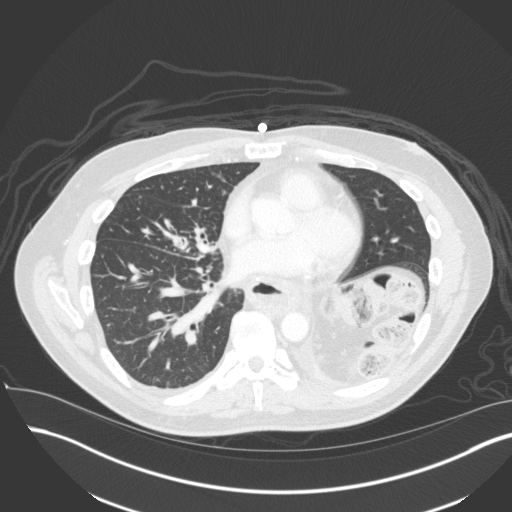
[im 85/163  lung]
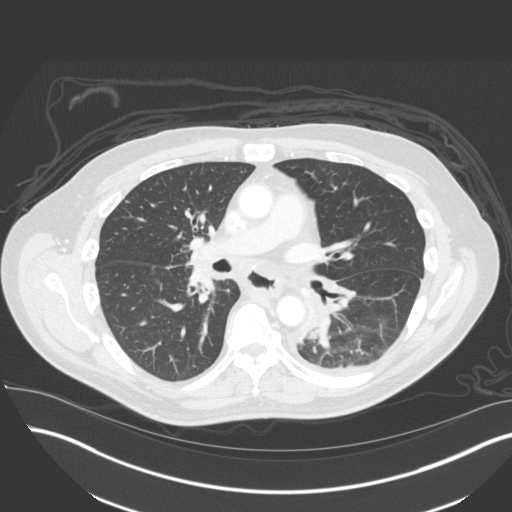
[im 97/163  lung]
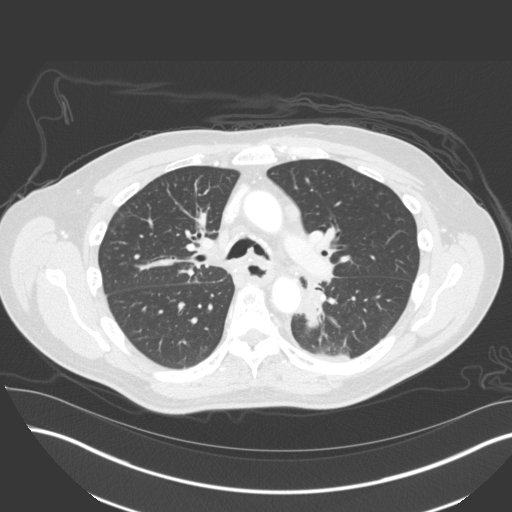
[im 109/163  lung]
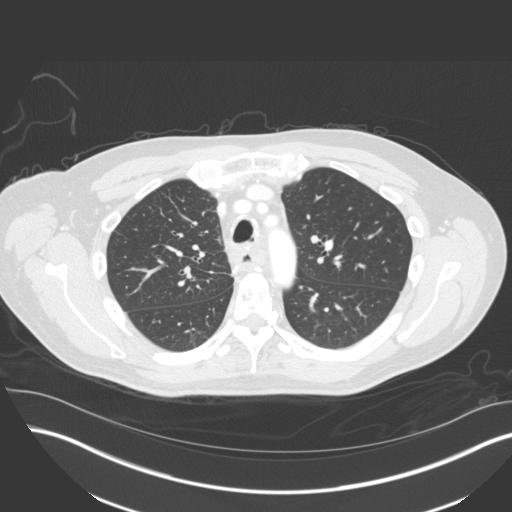
[im 127/163  mediastinal]
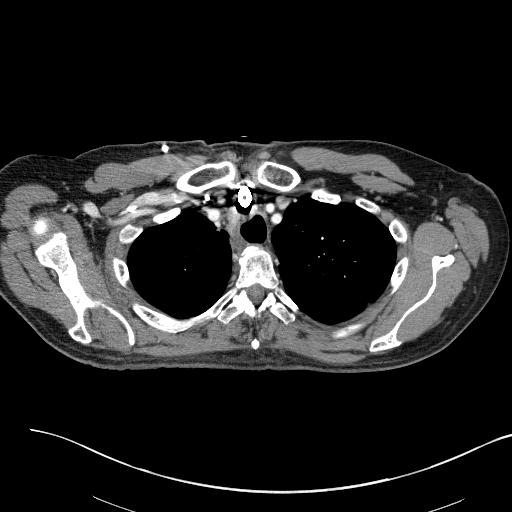
[im 127/163  lung]
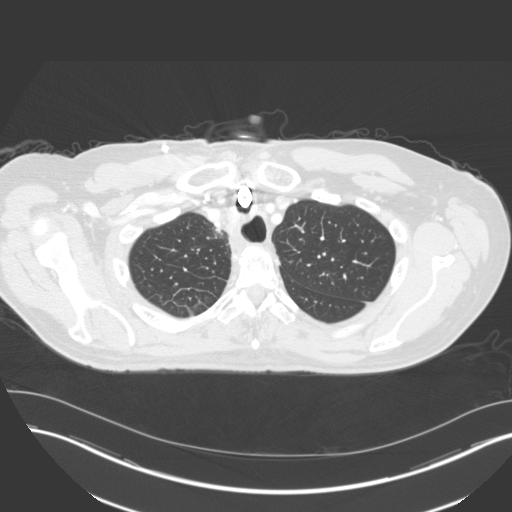
[im 139/163  lung]
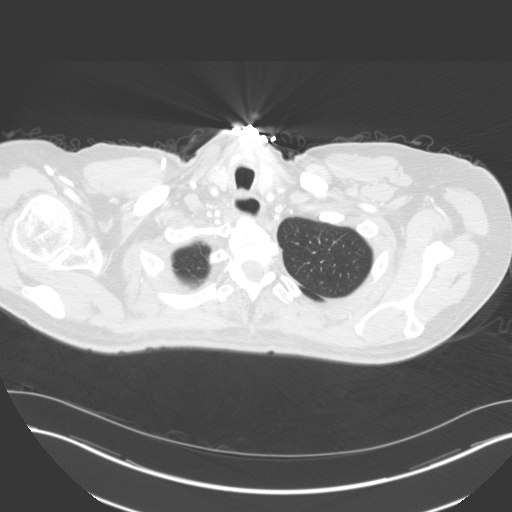
[im 151/163  lung]
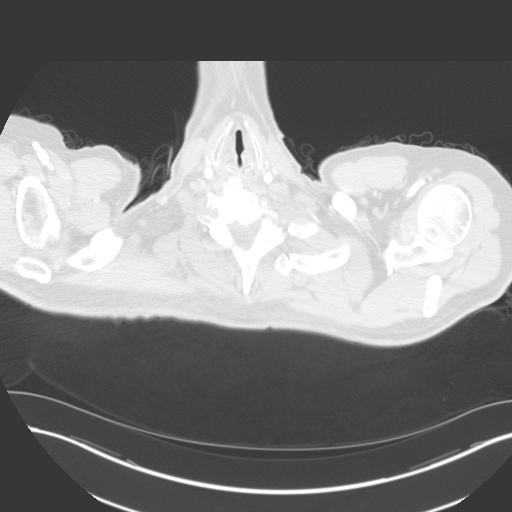

[Series 6: coronals · coronal · 0.66mm/px · 3 of 101 slices shown]
[im 21/101  lung]
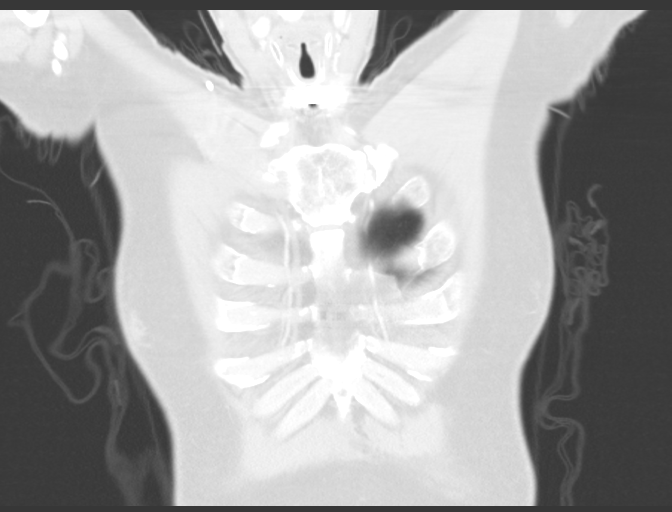
[im 41/101  lung]
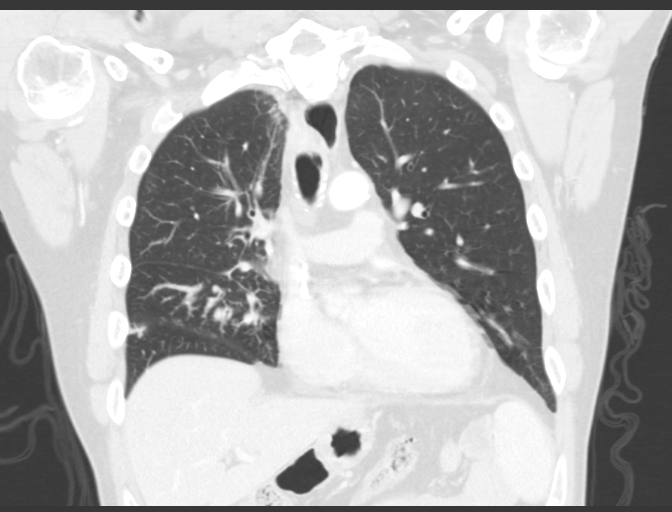
[im 61/101  lung]
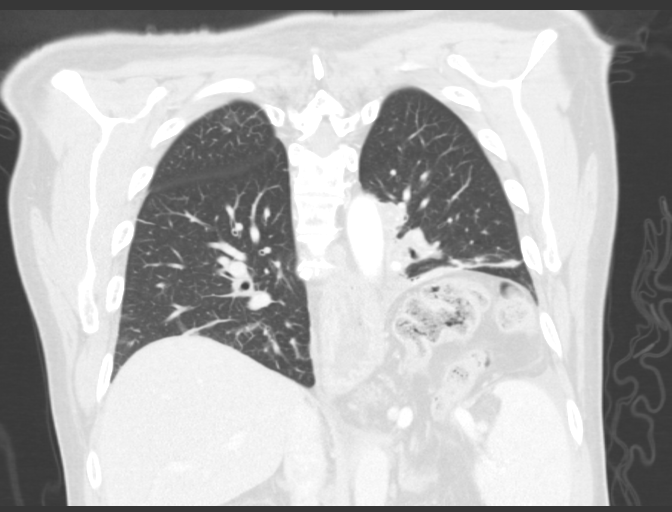

[14 of 36 positions shown; findings below may reference images not displayed]

FINDINGS: Cardiovascular: Normal heart size. Stable trace pericardial
effusion/thickening. Right internal jugular MediPort terminates in
the lower third of the superior vena cava. Atherosclerotic
nonaneurysmal thoracic aorta. Normal caliber pulmonary arteries. No
central pulmonary emboli.

Mediastinum/Nodes: No discrete thyroid nodules. Status post
esophagectomy with gastric pull-through, with intact appearing
anastomosis in the upper mediastinum. No pneumomediastinum. No
mediastinal fluid collections. No axillary adenopathy. Mildly
enlarged AP window 1.0 cm node (series 2/ image 67), previously
cm, mildly increased. Bilateral hilar lymphadenopathy is mildly
increased bilaterally, for example a 1.2 cm right hilar node (series
2/ image 79) is increased from 1.1 cm and a left infrahilar 2.0 cm
node (series 2/ image 72) is increased from 1.7 cm.

Lungs/Pleura: No pneumothorax. No pleural effusion. Tracheostomy
tube tip is well positioned in the upper tracheal lumen. Superior
segment left lower lobe 5 mm solid pulmonary nodule (series 5/ image
40) is increased from 3 mm. Central left upper lobe 1.8 cm nodule
(series 5/ image 72) is increased from 1.7 cm. There is extensive
interlobular septal thickening asymmetrically involving the right
lung, increased. There is bandlike consolidation and volume loss in
the left lower lobe, increased, favor scarring or atelectasis.

Upper abdomen: Cholelithiasis. Enlarging 2.9 x 1.9 cm right adrenal
nodule (series 2/ image 141), previously 1.9 x 1.3 cm.

Musculoskeletal: Stable lytic lesion in the posterior T7 vertebral
body. No new focal osseous lesions. Mild thoracic spondylosis.
IMPRESSION: 1. Mediastinal and bilateral hilar lymphadenopathy is mildly
increased.
2. Interlobular septal thickening asymmetrically involving the
entire right lung is increased, worrisome for lymphangitic tumor.
3. Two left lung nodules are mildly increased, most consistent with
enlarging metastases.
4. Stable T7 vertebral body lytic lesion.
5. Additional findings include aortic atherosclerosis,
cholelithiasis and scarring versus atelectasis at the left lung
base.

## 2017-01-08 ENCOUNTER — Ambulatory Visit
Admission: RE | Admit: 2017-01-08 | Discharge: 2017-01-08 | Disposition: A | Discharge status: Home / Self Care | Payer: BC Managed Care – PPO | Source: Ambulatory Visit | Attending: Radiation Oncology | Admitting: Radiation Oncology

## 2017-01-08 DIAGNOSIS — Z510 Encounter for antineoplastic radiation therapy: Secondary | ICD-10-CM | POA: Diagnosis not present

## 2017-01-09 ENCOUNTER — Ambulatory Visit
Admission: RE | Admit: 2017-01-09 | Discharge: 2017-01-09 | Disposition: A | Discharge status: Home / Self Care | Payer: BC Managed Care – PPO | Source: Ambulatory Visit | Attending: Radiation Oncology | Admitting: Radiation Oncology

## 2017-01-09 DIAGNOSIS — Z510 Encounter for antineoplastic radiation therapy: Secondary | ICD-10-CM | POA: Diagnosis not present

## 2017-01-10 ENCOUNTER — Ambulatory Visit
Admission: RE | Admit: 2017-01-10 | Discharge: 2017-01-10 | Disposition: A | Discharge status: Home / Self Care | Payer: BC Managed Care – PPO | Source: Ambulatory Visit | Attending: Radiation Oncology | Admitting: Radiation Oncology

## 2017-01-10 DIAGNOSIS — Z510 Encounter for antineoplastic radiation therapy: Secondary | ICD-10-CM | POA: Diagnosis not present

## 2017-01-11 ENCOUNTER — Ambulatory Visit (HOSPITAL_BASED_OUTPATIENT_CLINIC_OR_DEPARTMENT_OTHER): Payer: BLUE CROSS/BLUE SHIELD | Admitting: Oncology

## 2017-01-11 ENCOUNTER — Encounter: Payer: BLUE CROSS/BLUE SHIELD | Admitting: Nutrition

## 2017-01-11 ENCOUNTER — Ambulatory Visit
Admission: RE | Admit: 2017-01-11 | Discharge: 2017-01-11 | Disposition: A | Discharge status: Home / Self Care | Payer: BC Managed Care – PPO | Source: Ambulatory Visit | Attending: Radiation Oncology | Admitting: Radiation Oncology

## 2017-01-11 ENCOUNTER — Ambulatory Visit: Payer: BLUE CROSS/BLUE SHIELD

## 2017-01-11 ENCOUNTER — Other Ambulatory Visit: Payer: BLUE CROSS/BLUE SHIELD

## 2017-01-11 ENCOUNTER — Other Ambulatory Visit: Payer: Self-pay | Admitting: Oncology

## 2017-01-11 ENCOUNTER — Encounter: Payer: Self-pay | Admitting: Radiation Oncology

## 2017-01-11 ENCOUNTER — Ambulatory Visit
Admission: RE | Admit: 2017-01-11 | Discharge: 2017-01-11 | Disposition: A | Discharge status: Home / Self Care | Payer: BLUE CROSS/BLUE SHIELD | Source: Ambulatory Visit | Attending: Radiation Oncology | Admitting: Radiation Oncology

## 2017-01-11 ENCOUNTER — Other Ambulatory Visit: Payer: Self-pay | Admitting: *Deleted

## 2017-01-11 ENCOUNTER — Other Ambulatory Visit (HOSPITAL_BASED_OUTPATIENT_CLINIC_OR_DEPARTMENT_OTHER): Payer: BLUE CROSS/BLUE SHIELD

## 2017-01-11 VITALS — BP 127/89 | HR 117 | Temp 98.5°F | Resp 20 | Ht 73.0 in | Wt 162.4 lb

## 2017-01-11 VITALS — Wt 162.0 lb

## 2017-01-11 DIAGNOSIS — F329 Major depressive disorder, single episode, unspecified: Secondary | ICD-10-CM

## 2017-01-11 DIAGNOSIS — C155 Malignant neoplasm of lower third of esophagus: Secondary | ICD-10-CM | POA: Diagnosis not present

## 2017-01-11 DIAGNOSIS — M545 Low back pain: Secondary | ICD-10-CM

## 2017-01-11 DIAGNOSIS — Z95828 Presence of other vascular implants and grafts: Secondary | ICD-10-CM

## 2017-01-11 DIAGNOSIS — C7951 Secondary malignant neoplasm of bone: Secondary | ICD-10-CM

## 2017-01-11 DIAGNOSIS — C153 Malignant neoplasm of upper third of esophagus: Secondary | ICD-10-CM

## 2017-01-11 DIAGNOSIS — R634 Abnormal weight loss: Secondary | ICD-10-CM | POA: Diagnosis not present

## 2017-01-11 DIAGNOSIS — G620 Drug-induced polyneuropathy: Secondary | ICD-10-CM

## 2017-01-11 DIAGNOSIS — Z510 Encounter for antineoplastic radiation therapy: Secondary | ICD-10-CM | POA: Diagnosis not present

## 2017-01-11 LAB — COMPREHENSIVE METABOLIC PANEL
ALT: 7 U/L (ref 0–55)
AST: 11 U/L (ref 5–34)
Albumin: 3.5 g/dL (ref 3.5–5.0)
Alkaline Phosphatase: 91 U/L (ref 40–150)
Anion Gap: 10 mEq/L (ref 3–11)
BUN: 8.1 mg/dL (ref 7.0–26.0)
CALCIUM: 9.9 mg/dL (ref 8.4–10.4)
CHLORIDE: 101 meq/L (ref 98–109)
CO2: 26 mEq/L (ref 22–29)
CREATININE: 0.7 mg/dL (ref 0.7–1.3)
EGFR: >90 mL/min/{1.73_m2} (ref >90)
GLUCOSE: 114 mg/dL (ref 70–140)
POTASSIUM: 4 meq/L (ref 3.5–5.1)
SODIUM: 138 meq/L (ref 136–145)
Total Bilirubin: 0.38 mg/dL (ref 0.20–1.20)
Total Protein: 7.5 g/dL (ref 6.4–8.3)

## 2017-01-11 LAB — CBC WITH DIFFERENTIAL/PLATELET
BASO%: 0.1 % (ref 0.0–2.0)
Basophils Absolute: 0 10*3/uL (ref 0.0–0.1)
EOS%: 0.7 % (ref 0.0–7.0)
Eosinophils Absolute: 0.1 10*3/uL (ref 0.0–0.5)
HEMATOCRIT: 33.9 % — AB (ref 38.4–49.9)
HEMOGLOBIN: 11.6 g/dL — AB (ref 13.0–17.1)
LYMPH#: 0.7 10*3/uL — AB (ref 0.9–3.3)
LYMPH%: 9.1 % — ABNORMAL LOW (ref 14.0–49.0)
MCH: 26.9 pg — ABNORMAL LOW (ref 27.2–33.4)
MCHC: 34.2 g/dL (ref 32.0–36.0)
MCV: 78.7 fL — ABNORMAL LOW (ref 79.3–98.0)
MONO#: 0.9 10*3/uL (ref 0.1–0.9)
MONO%: 12.1 % (ref 0.0–14.0)
NEUT#: 6 10*3/uL (ref 1.5–6.5)
NEUT%: 78 % — ABNORMAL HIGH (ref 39.0–75.0)
Platelets: 188 10*3/uL (ref 140–400)
RBC: 4.31 10*6/uL (ref 4.20–5.82)
RDW: 15.7 % — AB (ref 11.0–14.6)
WBC: 7.7 10*3/uL (ref 4.0–10.3)

## 2017-01-11 LAB — CEA (IN HOUSE-CHCC): CEA (CHCC-IN HOUSE): 382.26 ng/mL — AB (ref 0.00–5.00)

## 2017-01-11 MED ORDER — ZOLPIDEM TARTRATE 5 MG PO TABS: 5.0000 mg | ORAL_TABLET | Freq: Every evening | ORAL | 0 refills | Status: DC | PRN

## 2017-01-11 MED ORDER — HYDROMORPHONE HCL 1 MG/ML PO LIQD: ORAL | 0 refills | Status: DC

## 2017-01-11 MED ORDER — HYDROCODONE-ACETAMINOPHEN 7.5-325 MG/15ML PO SOLN: ORAL | 0 refills | Status: DC

## 2017-01-11 MED ORDER — HEPARIN SOD (PORK) LOCK FLUSH 100 UNIT/ML IV SOLN
500.0000 [IU] | Freq: Once | INTRAVENOUS | Status: AC | PRN
  Administered 2017-01-11: 500 [IU] via INTRAVENOUS
  Filled 2017-01-11: qty 5

## 2017-01-11 MED FILL — ZOLPIDEM TARTRATE 5 MG TAB: 5 | 30 days supply | Qty: 30 | Fill #0

## 2017-01-11 MED FILL — MIRTAZAPINE 30 MG TABLET: 30 | 30 days supply | Qty: 30 | Fill #0

## 2017-01-11 MED FILL — HYDROMORPHONE 5 MG/5 ML SOL: 1 | 2 days supply | Qty: 60 | Fill #0

## 2017-01-11 MED FILL — oxyCODONE HCL 5 MG/5ML SOLN: 5 | 4 days supply | Qty: 240 | Fill #0

## 2017-01-11 NOTE — Progress Notes (Signed)
Department of Radiation Oncology  Phone:  [phone] Fax:        [phone]  Weekly Treatment Note    Name: Joel Osborne, Dr. Date: 01/11/2017 MRN: [id number] DOB: 1966/02/04   Diagnosis:   No diagnosis found.   Current dose: 27 Gy  Current fraction: 9   MEDICATIONS: Current Outpatient Prescriptions  Medication Sig Dispense Refill  . oxyCODONE-acetaminophen (ROXICET) 5-325 MG/5ML solution Take 5 mLs by mouth every 4 (four) hours as needed for severe pain. Take 5-27m oral every 4 to 6 hours for breakthrough pain  As needed    . acetaminophen (TYLENOL) 500 MG tablet Take 1,000 mg by mouth every 6 (six) hours as needed for mild pain or moderate pain.    .Marland KitchenALPRAZolam (XANAX) 0.5 MG tablet TAKE 1 TABLET BY MOUTH 3 TIMES DAILY AS NEEDED FOR ANXIETY 60 tablet 0  . Ascorbic Acid (VITAMIN C) 1000 MG tablet Take 1,000 mg by mouth 3 (three) times daily.    . benzonatate (TESSALON) 100 MG capsule Take 1 capsule (100 mg total) by mouth 3 (three) times daily as needed for cough. 90 capsule 1  . cholecalciferol (VITAMIN D) 1000 units tablet Take 1,000 Units by mouth daily.    . cyclobenzaprine (FLEXERIL) 10 MG tablet Take 1 tablet (10 mg total) by mouth 3 (three) times daily as needed for muscle spasms. 30 tablet 1  . emollient (BIAFINE) cream Apply 1 application topically daily.    .Marland KitchenHYDROcodone-acetaminophen (HYCET) 7.5-325 mg/15 ml solution Place 15-20 mls into feeding tube every 6 hrs as needed for moderate pain 240 mL 0  . HYDROmorphone HCl (DILAUDID) 1 MG/ML LIQD Take 4-6 mls (4-6 mg total) every 6 hrs as needed 60 mL 0  . ibuprofen (ADVIL,MOTRIN) 200 MG tablet Take 400 mg by mouth every 8 (eight) hours as needed for moderate pain. Reported on 05/12/2016    . lactase (LACTAID) 3000 UNITS tablet Take 3,000 Units by mouth daily as needed (lactose intolerance). Reported on 05/12/2016    . lidocaine-prilocaine (EMLA) cream Apply 1 application topically as needed. Apply tablespoon to  PAC 2 hours prior to stick and cover with plastic wrap 30 g 5  . mirtazapine (REMERON) 30 MG tablet TAKE 1 TABLET (30 MG TOTAL) BY MOUTH AT BEDTIME. 30 tablet 0  . pregabalin (LYRICA) 50 MG capsule Take 1 capsule (50 mg total) by mouth 2 (two) times daily. 60 capsule 0  . sennosides-docusate sodium (SENOKOT-S) 8.6-50 MG tablet Take 1 tablet by mouth daily.    .Marland Kitchenzolpidem (AMBIEN) 5 MG tablet Take 1 tablet (5 mg total) by mouth at bedtime as needed. for sleep 30 tablet 0   No current facility-administered medications for this encounter.      ALLERGIES: Lactose intolerance (gi)   LABORATORY DATA:  Lab Results  Component Value Date   WBC 7.7 01/11/2017   HGB 11.6 (L) 01/11/2017   HCT 33.9 (L) 01/11/2017   MCV 78.7 (L) 01/11/2017   PLT 188 01/11/2017   Lab Results  Component Value Date   NA 138 01/11/2017   K 4.0 01/11/2017   CL 101 12/08/2016   CO2 26 01/11/2017   Lab Results  Component Value Date   ALT 7 01/11/2017   AST 11 01/11/2017   ALKPHOS 91 01/11/2017   BILITOT 0.38 01/11/2017     NARRATIVE: TManon HildingTselides, Dr. was seen today for weekly treatment management. The chart was checked and the patient's films were reviewed.  The patient has  completed 9/10 fractions. He reports he is not sleeping well, and is very fatigued. He repots pain 5/10 in severity to the mid and lower back, abdomen, right groin, and left knee. The patient reports he took liquid hydrocodone 15 mL before leaving home today; it has not helped his pain much. The patient reports feeling nauseated. He reports an episode of diarrhea yesterday.  PHYSICAL EXAMINATION:   Vitals with BMI 01/11/2017  Height '6\' 1"'$   Weight 162 lbs 6 oz  BMI 74.0  Systolic 814  Diastolic 89  Pulse 481  Respirations 20   ASSESSMENT: The patient is doing satisfactorily with treatment Although he he is having some increased pain. He is aware that he does have additional disease in the T7 vertebral body and the sacrum  centrally, in particular. We discussed possible treatment to these areas once again today. He wants to see how he does with medication over the next week and he will let us know if he does wish to proceed with additional radiation treatment to these areas.  PLAN: We will continue with the patient's radiation treatment as planned.  The patient will finish his final treatment tomorrow in follow-up in our clinic in 1 month.  ------------------------------------------------  [name], MD, PhD  This document serves as a record of services personally performed by Kyung Rudd, MD. It was created on his behalf by Maryla Morrow, a trained medical scribe. The creation of this record is based on the scribe's personal observations and the provider's statements to them. This document has been checked and approved by the attending provider.

## 2017-01-11 NOTE — Progress Notes (Addendum)
Vitals taken in Med/Onc today, radiation treatment L2-l4 9/10 TREATMENTS, NOT SLEEPING WELL EITHER, patient pain level 5/10 lower back,, abdomen,  And pain lower  Right mid back   Also right groin pain, ,  took hydrocodone/liquid 17m  Before leaving home today,  Not helped much, or ,   Also nauseated,  Also pain left knee, had diarrhea yesterday, hoarseness, FATIGUED Wt Readings from Last 3 Encounters:  01/11/17 162 lb (73.5 kg)  01/11/17 162 lb 6.4 oz (73.7 kg)  01/05/17 167 lb (75.8 kg)

## 2017-01-11 NOTE — Progress Notes (Signed)
[place] OFFICE PROGRESS NOTE   Diagnosis: Esophagus cancer  INTERVAL HISTORY:   Joel Osborne returns as scheduled. He began palliative radiation to the lumbar spine on 01/01/2017. He continues to have pain in the lower back, not relieved with the hydrocodone or Dilaudid. He also has pain at the right side of the mid thoracic spine and in the right groin. No dysphagia. He is staying in bed most of the time.   Objective:  Vital signs in last 24 hours:  Blood pressure 127/89, pulse (!) 117, temperature 98.5 F (36.9 C), temperature source Oral, resp. rate 20, height _0  (1.854 m), weight 162 lb 6.4 oz (73.7 kg), SpO2 99 %.    HEENT: No thrush Resp: Distant breath sounds, no respiratory distress Cardio: Regular rate and rhythm GI: No hepatomegaly, nontender, examination of the right groin is unremarkable Vascular: No leg edema   Portacath/PICC-without erythema  Lab Results:  Lab Results  Component Value Date   WBC 7.7 01/11/2017   HGB 11.6 (L) 01/11/2017   HCT 33.9 (L) 01/11/2017   MCV 78.7 (L) 01/11/2017   PLT 188 01/11/2017   NEUTROABS 6.0 01/11/2017    Medications: I have reviewed the patient's current medications.  Assessment/Plan: 1.Adenocarcinoma of the distal esophagus/gastric cardia-status post an endoscopic biopsy on 03/18/2013 confirming adenocarcinoma, HER-2/neunot amplified  -Staging CT scans 03/20/2013 confirmed a distal esophageal/gastric cardia mass, distal paraesophageal lymph nodes and paratracheal nodes concerning for metastatic lymphadenopathy  -A PET scan 03/27/2013 with a multifocal area of hypermetabolic activity involving the thoracic esophagus extending into the gastric cardia and hypermetabolic mediastinal nodes, no distant metastatic disease  -Initiation of radiation 04/07/2013 and weekly Taxol/carboplatin 04/08/2013, radiation completed 05/15/2013  -Restaging PET scan 06/09/2013-no evidence of metastatic disease, no  hypermetabolic lymphadenopathy  -Esophagogastrectomy 07/07/2013 revealed a ypT1b,ypN0 tumor with negative surgical margins  -CT neck 06/09/2014 revealed a necrotic lymph node in the right tracheoesophageal groove  -PET scan 11/15/2014 revealed a single focus of hypermetabolism corresponding to the necrotic right upper mediastinum lymph node -EUS biopsy of the paraesophageal lymph node 06/25/2012 confirmed metastatic adenocarcinoma  -Radiation completed 08/11/2014 -Rising CEA May 2016 -PET scan 07/13/2015 with hypermetabolic right paratracheal and right hilar adenopathy -Cycle 1 FOLFOX 08/03/2015 -Cycle 2 FOLFOX 08/17/2015 - cycle 3 FOLFOX 08/31/2015 (oxaliplatin dose reduced secondary to thrombocytopenia)  -Cycle 4 FOLFOX 09/14/2015 -Cycle 5 FOLFOX 09/28/2015 - restaging CTs 09/30/2015 with stable disease involving neck/chest lymph nodes, a slight increase in size of left upper lobe lung nodule  - cycle 6 FOLFOX 10/12/2015  -FOLFOX discontinued secondary to development of bilateral vocal cord paralysis November 2016 - CT scan of the neck and chest on 11/22/15 revealed no definite disease in the neck. At the thoracic inlet region, there is questionably slight increase in prominence of soft tissue at the right tracheoesophageal groove. This could be an enlarging node or mass but could also relate to the esophagectomy and pull-through surgery.Enlarging lingular and left lower lobe nodules are worrisome for metastatic disease. Peribronchovascular nodular consolidation in the anterior right lower lobe, slightly more prominent, indeterminate. Cholelithiasis. Left diaphragmatic hernia, stable. -Xeloda 1 week on/1 week off beginning 12/11/2015 -Restaging CTs of the neck and chest 02/07/2016 revealed enlargement of 2 left-sided lung nodules, no other evidence of disease progression -Cycle 1 Pembrolizumab 02/25/2016  -Cycle 2 Pembrolizumab 03/17/2016 -Cycle 3 Pembrolizumab 04/07/2016   -CT  04/20/2016 consistent with progressive disease involving a large pericardial effusion, bilateral pleural effusions, enlarged mediastinal lymph nodes and lung nodules  Cytology from  the pericardial fluid 04/21/2016-positive for metastatic adenocarcinoma -CT 05/01/2016 was slight worsening of right hilar lymphadenopathy with near complete collapse of the right middle lobe, nodular metastases in the left lung, stable bilateral pleural effusions  Cycle 1 Taxol/ramucirumab 05/12/2016; day 8 Taxol 05/19/2016; day 15 Taxol/ramucirumab 05/26/2016.  Cycle 2 day 1 Taxol/ramucirumab 06/22/2016  Cycle 2 day 8 Taxol 06/29/2016   Cycle 2 day 15 Taxol/ramucirumab07/28/2017  Cycle 3 day 1 Taxol/ramucirumab 07/20/2016  Cycle 3 day 8 Taxol 07/27/2016  Cycle 3 day 15 Taxol/ramucirumab08/24/2017  Restaging chest CT 08/10/2016-left upper lobe pulmonary nodule similar; left lower lobe pulmonary nodule may have enlarged minimally; improved appearance of the mediastinum and right infrahilar region with decreased soft tissue fullness; improvement in probable left infrahilar adenopathy; decreased left and resolved right pleural effusions; developing right adrenal nodularity; T7 vertebral body lesion suspicious for osseous metastasis.  Cycle 4 Taxol/ramucirumab09/06/2016   cycle 5 Taxol/ramucirumab10/03/2016  restaging chest CT 10/05/2016 with slight enlargement of mediastinal lymph nodes and pulmonary nodules, enlargement of left adrenal lesion,? Lymphatic tumor spread in the right lung  Cycle 6 Taxol/ ramucirumab11/01/2016; Taxol placed on hold beginning 10/26/2016 due to neuropathy symptoms, continuation of ramucirumab alone  Cycle 7Taxol/ ramucirumab11/30/2017-Taxol remains on hold  restaging chest CT 12/08/2016-minimal increase in size of a left hilar node and left lung nodule. Mild increase in the size of a right adrenal metastasis  Cycle 8 Taxol/ ramucirumab01/03/2017-Taxol resumed  12/21/2016  CT lumbar spine 12/28/2016 with infiltrative tumor at L3 with mild L3 pathologic fracture. S2 lytic lesion/metastasis. New 3.5 x 2.3 cm right adrenal mass.  Initiation of palliative radiation to the lumbar spine 01/01/2017 Course of palliative rastarted 01/01/2017 2. Indeterminate 15 mm right liver lesion on the CT scan 03/20/2013, MRI of the liver 06/09/2013 confirmed a right liver hemangioma  3. Anorexia/weight loss and solid dysphagia secondary to #1 -improved 4. History of a colon polyp, status post a polypectomy October 2013  5. history of Thrombocytopenia secondary chemotherapy-the carboplatin wase dose reduced with week 5 of chemotherapy chemotherapy  6. hoarseness secondary to right vocal cord paralysis-CT/PET scan imaging consistent with a malignant right paratracheal lymph node causing the vocal cord paralysis  -Status post a laryngeal plasty at [facility] on 09/02/2014 7. Esophageal stricture-status post dilation 06/08/2015 -Food impaction removal 09/29/2015 -Esophageal stricture dilation 10/06/2015 8. Port-A-Cath placement 07/20/2015 9. Delayed nausea following cycle 1 FOLFOX. Aloxi and Emend added with cycle 2. 10. Thrombocytopenia secondary to chemotherapy  11. Emotional lability/depression-prophylactic Decadron discontinued; Remeron initiated 12. Progressive hoarseness November 2016, diagnosed with bilateral vocal cord paralysis, status post a tracheostomy and bilateral injection laryngoplasty at [facility] on 10/27/2015 13. Pericardial window 04/21/2016  14. High fever 07/06/2016-chest x-ray suggestive of possible left lower lobe pneumonia-treated with Augmentin  15. 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 Taxol placed on hold beginning 10/26/2016.Lyrica added 11/23/2016 16. Depression-Remeron dose increased to 30 mg 11/23/2016 17. Back pain-potentially related to progressive esophagus cancer; CT 11/28/2016 with no bone lesion, bony  distraction or fracture. Moderately severe lumbar facet arthritis greatest at L4-5 on the right. Good preservation of the intravertebral disc spaces. Stable, degenerative appearing, minimal anterolisthesis at L4-5. CT lumbar spine 12/28/2016-infiltrative tumor L3 with mild L3 pathologic fracture. S2 lytic lesion/metastasis. New 3.5 x 2.3 cm adrenal mass consistent with metastasis. Tumor mildly narrows the right L3-4 neural foramen.     Disposition:  Joel Osborne is completing a course of palliative radiation to the lumbar spine. He continues to have pain in the lower  back. He now has pain in the region of the mid thoracic spine and right groin. The pain could be related to metastases in these areas. He would like for radiation oncology to consider treating these areas. Systemic treatment remains on hold. I adjusted the narcotic pain regimen today.  I contacted radiation oncology and they will evaluate him today. They will consider treatment of the thoracic spine and pelvis. They will also consider the indication for a bone scan.  Joel Osborne will return for an office visit in one week.  25 minutes were spent with the patient today. The majority of the time was used for counseling and coordination of care.   01/11/2017  10:18 AM

## 2017-01-11 NOTE — Patient Instructions (Signed)

## 2017-01-12 ENCOUNTER — Ambulatory Visit
Admission: RE | Admit: 2017-01-12 | Discharge: 2017-01-12 | Disposition: A | Discharge status: Home / Self Care | Payer: BC Managed Care – PPO | Source: Ambulatory Visit | Attending: Radiation Oncology | Admitting: Radiation Oncology

## 2017-01-12 ENCOUNTER — Encounter: Payer: Self-pay | Admitting: Radiation Oncology

## 2017-01-12 DIAGNOSIS — Z510 Encounter for antineoplastic radiation therapy: Secondary | ICD-10-CM | POA: Diagnosis not present

## 2017-01-13 ENCOUNTER — Telehealth: Payer: Self-pay | Admitting: Oncology

## 2017-01-13 NOTE — Telephone Encounter (Signed)
Follow up appointment scheduled with Joel Osborne, per 01/11/17 los. Appointment confirmed with patient.

## 2017-01-16 ENCOUNTER — Telehealth: Payer: Self-pay

## 2017-01-16 NOTE — Telephone Encounter (Signed)
Patient called requesting if he was getting chemo no 01/18/17 when he saw Marlynn Perking and if he is still getting a bone scan. Spoke with MD, treatment options will be discussed with pt on 2/8 and Dr. Lisbeth Renshaw is ordering the bone scan. Called pt back and informed him, pt verbalized understanding.

## 2017-01-18 ENCOUNTER — Ambulatory Visit (HOSPITAL_BASED_OUTPATIENT_CLINIC_OR_DEPARTMENT_OTHER): Payer: BLUE CROSS/BLUE SHIELD | Admitting: Nurse Practitioner

## 2017-01-18 ENCOUNTER — Other Ambulatory Visit: Payer: Self-pay | Admitting: *Deleted

## 2017-01-18 ENCOUNTER — Telehealth: Payer: Self-pay | Admitting: Nurse Practitioner

## 2017-01-18 DIAGNOSIS — M545 Low back pain: Secondary | ICD-10-CM | POA: Diagnosis not present

## 2017-01-18 DIAGNOSIS — G8929 Other chronic pain: Secondary | ICD-10-CM | POA: Diagnosis not present

## 2017-01-18 DIAGNOSIS — C155 Malignant neoplasm of lower third of esophagus: Secondary | ICD-10-CM

## 2017-01-18 DIAGNOSIS — R5382 Chronic fatigue, unspecified: Secondary | ICD-10-CM

## 2017-01-18 MED ORDER — HYDROMORPHONE HCL 1 MG/ML PO LIQD: ORAL | 0 refills | Status: DC

## 2017-01-18 MED ORDER — OXYCODONE HCL 5 MG/5ML PO SOLN: 5.0000 mg | ORAL | 0 refills | Status: DC | PRN

## 2017-01-18 MED ORDER — OXYCODONE-ACETAMINOPHEN 5-325 MG/5ML PO SOLN: 5.0000 mL | ORAL | 0 refills | Status: DC | PRN

## 2017-01-18 MED FILL — HYDROMORPHONE 5 MG/5 ML SOL: 1 | 3 days supply | Qty: 60 | Fill #0

## 2017-01-18 MED FILL — oxyCODONE HCL 5 MG/5ML SOLN: 5 | 15 days supply | Qty: 473 | Fill #0

## 2017-01-18 NOTE — Progress Notes (Signed)
Radiation Oncology         [phone]) [phone] ________________________________  Name: Joel Osborne, Dr. MRN: [id number]  Date: 01/12/2017  DOB: 14-Mar-1966  End of Treatment Note  Diagnosis:   51 year-old gentleman with progressive metastatic disease involving the right adrenal gland and lumbosacral spine. He appears to be symptomatic from metastatic disease involving L3.        ICD-9-CM ICD-10-CM   1. Bone metastasis ([place]) 198.5 C79.51     Indication for treatment:  Curative       Radiation treatment dates:   01/01/2017 to 01/12/2017  Site/dose:   The L2-4 bone met was treated to 30 Gy in 10 fractions of 3 Gy per fraction.  Beams/energy:   3D // 10X, 15X  Narrative: The patient tolerated radiation treatment relatively well, although he is having some increased pain.   He reports pain 5/10 in severity to the mid and lower back, abdomen, right groin, and left knee. He also reports he is not sleeping well, and is very fatigued.  Plan: The patient has completed radiation treatment. The patient is aware that he does not have additional disease in the T7 vertebral body and the sacrum centrally, in particular. We discussed possible treatment to these areas once again. The patient wants to see how he does with medication over the next week and he will let us know if he does wish to proceed with additional radiation treatment to these areas. The patient will return to radiation oncology clinic for routine followup in one month. I advised them to call or return sooner if they have any questions or concerns related to their recovery or treatment.  ------------------------------------------------  [name], MD, PhD  This document serves as a record of services personally performed by Kyung Rudd, MD. It was created on his behalf by Arlyce Harman, a trained medical scribe. The creation of this record is based on the scribe's personal observations and the provider's statements to them. This  document has been checked and approved by the attending provider.

## 2017-01-18 NOTE — Progress Notes (Addendum)
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 He notes improvement in low back pain. He continues to have pain right side of the mid thoracic spine and in the right groin. For the past week he has had left knee pain. He is taking Dilaudid and oxycodone with partial relief. He takes 1 pain medication during the day and the other during the night. He in general feels weak. He spends approximately 80% of the day sitting. Bowels moving with a laxative.  Objective:  Vital signs in last 24 hours:  Blood pressure 110/81, pulse (!) 108, temperature 98.4 F (36.9 C), temperature source Oral, resp. rate 18, height '6\' 1"'$  (1.854 m), weight 162 lb 1.6 oz (73.5 kg), SpO2 98 %.    HEENT: No thrush or ulcers. Lymphatics: No adenopathy in the right inguinal region. Resp: Lungs clear bilaterally. Cardio: Regular rate and rhythm. GI: No hepatomegaly. Vascular: No leg edema. Port-A-Cath without erythema.  Lab Results:  Lab Results  Component Value Date   WBC 7.7 01/11/2017   HGB 11.6 (L) 01/11/2017   HCT 33.9 (L) 01/11/2017   MCV 78.7 (L) 01/11/2017   PLT 188 01/11/2017   NEUTROABS 6.0 01/11/2017    Imaging:  No results found.  Medications: I have reviewed the patient's current medications.  Assessment/Plan: 1.Adenocarcinoma of the distal esophagus/gastric cardia-status post an endoscopic biopsy on 03/18/2013 confirming adenocarcinoma, HER-2/neunot amplified  -Staging CT scans 03/20/2013 confirmed a distal esophageal/gastric cardia mass, distal paraesophageal lymph nodes and paratracheal nodes concerning for metastatic lymphadenopathy  -A PET scan 03/27/2013 with a multifocal area of hypermetabolic activity involving the thoracic esophagus extending into the gastric cardia and hypermetabolic mediastinal nodes, no distant metastatic disease  -Initiation of radiation 04/07/2013 and weekly  Taxol/carboplatin 04/08/2013, radiation completed 05/15/2013  -Restaging PET scan 06/09/2013-no evidence of metastatic disease, no hypermetabolic lymphadenopathy  -Esophagogastrectomy 07/07/2013 revealed a ypT1b,ypN0 tumor with negative surgical margins  -CT neck 06/09/2014 revealed a necrotic lymph node in the right tracheoesophageal groove  -PET scan 11/15/2014 revealed a single focus of hypermetabolism corresponding to the necrotic right upper mediastinum lymph node -EUS biopsy of the paraesophageal lymph node 06/25/2012 confirmed metastatic adenocarcinoma  -Radiation completed 08/11/2014 -Rising CEA May 2016 -PET scan 07/13/2015 with hypermetabolic right paratracheal and right hilar adenopathy -Cycle 1 FOLFOX 08/03/2015 -Cycle 2 FOLFOX 08/17/2015 - cycle 3 FOLFOX 08/31/2015 (oxaliplatin dose reduced secondary to thrombocytopenia)  -Cycle 4 FOLFOX 09/14/2015 -Cycle 5 FOLFOX 09/28/2015 - restaging CTs 09/30/2015 with stable disease involving neck/chest lymph nodes, a slight increase in size of left upper lobe lung nodule  - cycle 6 FOLFOX 10/12/2015  -FOLFOX discontinued secondary to development of bilateral vocal cord paralysis November 2016 - CT scan of the neck and chest on 11/22/15 revealed no definite disease in the neck. At the thoracic inlet region, there is questionably slight increase in prominence of soft tissue at the right tracheoesophageal groove. This could be an enlarging node or mass but could also relate to the esophagectomy and pull-through surgery.Enlarging lingular and left lower lobe nodules are worrisome for metastatic disease. Peribronchovascular nodular consolidation in the anterior right lower lobe, slightly more prominent, indeterminate. Cholelithiasis. Left diaphragmatic hernia, stable. -Xeloda 1 week on/1 week off beginning 12/11/2015 -Restaging CTs of the neck and chest 02/07/2016 revealed enlargement of 2 left-sided lung nodules, no other evidence of  disease progression -Cycle 1 Pembrolizumab 02/25/2016  -Cycle 2 Pembrolizumab 03/17/2016 -Cycle 3 Pembrolizumab 04/07/2016   -  CT 04/20/2016 consistent with progressive disease involving a large pericardial effusion, bilateral pleural effusions, enlarged mediastinal lymph nodes and lung nodules  Cytology from the pericardial fluid 04/21/2016-positive for metastatic adenocarcinoma -CT 05/01/2016 was slight worsening of right hilar lymphadenopathy with near complete collapse of the right middle lobe, nodular metastases in the left lung, stable bilateral pleural effusions  Cycle 1 Taxol/ramucirumab 05/12/2016; day 8 Taxol 05/19/2016; day 15 Taxol/ramucirumab 05/26/2016.  Cycle 2 day 1 Taxol/ramucirumab 06/22/2016  Cycle 2 day 8 Taxol 06/29/2016   Cycle 2 day 15 Taxol/ramucirumab07/28/2017  Cycle 3 day 1 Taxol/ramucirumab 07/20/2016  Cycle 3 day 8 Taxol 07/27/2016  Cycle 3 day 15 Taxol/ramucirumab08/24/2017  Restaging chest CT 08/10/2016-left upper lobe pulmonary nodule similar; left lower lobe pulmonary nodule may have enlarged minimally; improved appearance of the mediastinum and right infrahilar region with decreased soft tissue fullness; improvement in probable left infrahilar adenopathy; decreased left and resolved right pleural effusions; developing right adrenal nodularity; T7 vertebral body lesion suspicious for osseous metastasis.  Cycle 4 Taxol/ramucirumab09/06/2016   cycle 5 Taxol/ramucirumab10/03/2016  restaging chest CT 10/05/2016 with slight enlargement of mediastinal lymph nodes and pulmonary nodules, enlargement of left adrenal lesion,? Lymphatic tumor spread in the right lung  Cycle 6 Taxol/ ramucirumab11/01/2016; Taxol placed on hold beginning 10/26/2016 due to neuropathy symptoms, continuation of ramucirumab alone  Cycle 7Taxol/ ramucirumab11/30/2017-Taxol remains on hold  restaging chest CT 12/08/2016-minimal increase in size of a left hilar node and  left lung nodule. Mild increase in the size of a right adrenal metastasis  Cycle 8 Taxol/ ramucirumab01/03/2017-Taxol resumed01/10/2017  CT lumbar spine 12/28/2016 with infiltrative tumor at L3 with mild L3 pathologic fracture. S2 lytic lesion/metastasis. New 3.5 x 2.3 cm right adrenal mass.  Initiation of palliative radiation to the lumbar spine 01/01/2017; completed 01/12/2017  2. Indeterminate 15 mm right liver lesion on the CT scan 03/20/2013, MRI of the liver 06/09/2013 confirmed a right liver hemangioma  3. Anorexia/weight loss and solid dysphagia secondary to #1 -improved 4. History of a colon polyp, status post a polypectomy October 2013  5. history of Thrombocytopenia secondary chemotherapy-the carboplatin wase dose reduced with week 5 of chemotherapy chemotherapy  6. hoarseness secondary to right vocal cord paralysis-CT/PET scan imaging consistent with a malignant right paratracheal lymph node causing the vocal cord paralysis  -Status post a laryngeal plasty at [facility] on 09/02/2014 7. Esophageal stricture-status post dilation 06/08/2015 -Food impaction removal 09/29/2015 -Esophageal stricture dilation 10/06/2015 8. Port-A-Cath placement 07/20/2015 9. Delayed nausea following cycle 1 FOLFOX. Aloxi and Emend added with cycle 2. 10. Thrombocytopenia secondary to chemotherapy  11. Emotional lability/depression-prophylactic Decadron discontinued; Remeron initiated 12. Progressive hoarseness November 2016, diagnosed with bilateral vocal cord paralysis, status post a tracheostomy and bilateral injection laryngoplasty at [facility] on 10/27/2015 13. Pericardial window 04/21/2016  14. High fever 07/06/2016-chest x-ray suggestive of possible left lower lobe pneumonia-treated with Augmentin  15. 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 Taxol placed on hold beginning 10/26/2016.Lyrica added 11/23/2016 16. Depression-Remeron dose increased to 30 mg  11/23/2016 17. Back pain-potentially related to progressive esophagus cancer; CT 11/28/2016 with no bone lesion, bony distraction or fracture. Moderately severe lumbar facet arthritis greatest at L4-5 on the right. Good preservation of the intravertebral disc spaces. Stable, degenerative appearing, minimal anterolisthesis at L4-5.CT lumbar spine 12/28/2016-infiltrative tumor L3 with mild L3 pathologic fracture. S2 lytic lesion/metastasis. New 3.5 x 2.3 cm adrenal mass consistent with metastasis. Tumor mildly narrows the right L3-4 neural foramen. 18. Pain at multiple sites including the  mid thoracic region, right groin, left knee. Referred for a bone scan.     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 He has completed the course of palliative radiation. Low back pain is better. He is now having pain at multiple other sites. We are referring him for a bone scan. He was given new prescriptions for hydrocodone and oxycodone. He understands he should not take both at the same time and should not drive while taking pain medication.  Dr. Benay Spice discussed a supportive care approach. Dr. Verline Lema would like to proceed with additional systemic therapy. We discussed irinotecan/cisplatin, etoposide/5-fluorouracil/leucovorin.  Dr. Verline Lema will return for a follow-up visit on 01/24/2017 for additional discussion.  Patient seen with Dr. Benay Spice. 30 minutes were spent face-to-face at today's visit with the majority of that time involved in counseling/coordination of care.    [place], Cour[place] ANP/GNP-BC   01/18/2017  11:57 AM  This was a shared visit with Ned Card. 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 He will be referred for a bone scan and return for an office visit next week. I recommend comfort/supportive care, but he would like to be treated with additional salvage systemic therapy. I explained the chance of a clinical response to further chemotherapy is low. We will consider  treatment with irinotecan/cisplatin with the ELF regimen based on his performance status next week.  Julieanne Manson, M.D.

## 2017-01-18 NOTE — Telephone Encounter (Signed)
Pt confirmed appt, reviewed bone scan, received avs

## 2017-01-24 ENCOUNTER — Telehealth: Payer: Self-pay | Admitting: Oncology

## 2017-01-24 ENCOUNTER — Other Ambulatory Visit (HOSPITAL_BASED_OUTPATIENT_CLINIC_OR_DEPARTMENT_OTHER): Payer: BLUE CROSS/BLUE SHIELD

## 2017-01-24 ENCOUNTER — Ambulatory Visit (HOSPITAL_BASED_OUTPATIENT_CLINIC_OR_DEPARTMENT_OTHER): Payer: BLUE CROSS/BLUE SHIELD | Admitting: Nurse Practitioner

## 2017-01-24 ENCOUNTER — Ambulatory Visit (HOSPITAL_BASED_OUTPATIENT_CLINIC_OR_DEPARTMENT_OTHER): Payer: BLUE CROSS/BLUE SHIELD

## 2017-01-24 VITALS — BP 116/74 | HR 113 | Temp 98.6°F | Resp 17 | Ht 73.0 in | Wt 163.7 lb

## 2017-01-24 DIAGNOSIS — C155 Malignant neoplasm of lower third of esophagus: Secondary | ICD-10-CM | POA: Diagnosis not present

## 2017-01-24 DIAGNOSIS — G893 Neoplasm related pain (acute) (chronic): Secondary | ICD-10-CM

## 2017-01-24 DIAGNOSIS — C7951 Secondary malignant neoplasm of bone: Secondary | ICD-10-CM

## 2017-01-24 DIAGNOSIS — Z95828 Presence of other vascular implants and grafts: Secondary | ICD-10-CM

## 2017-01-24 LAB — CBC WITH DIFFERENTIAL/PLATELET
BASO%: 0.3 % (ref 0.0–2.0)
Basophils Absolute: 0 10*3/uL (ref 0.0–0.1)
EOS%: 5.6 % (ref 0.0–7.0)
Eosinophils Absolute: 0.4 10*3/uL (ref 0.0–0.5)
HCT: 34 % — ABNORMAL LOW (ref 38.4–49.9)
HGB: 10.7 g/dL — ABNORMAL LOW (ref 13.0–17.1)
LYMPH%: 7.2 % — AB (ref 14.0–49.0)
MCH: 26.4 pg — ABNORMAL LOW (ref 27.2–33.4)
MCHC: 31.5 g/dL — AB (ref 32.0–36.0)
MCV: 84 fL (ref 79.3–98.0)
MONO#: 0.6 10*3/uL (ref 0.1–0.9)
MONO%: 8.6 % (ref 0.0–14.0)
NEUT%: 78.3 % — AB (ref 39.0–75.0)
NEUTROS ABS: 5.6 10*3/uL (ref 1.5–6.5)
PLATELETS: 126 10*3/uL — AB (ref 140–400)
RBC: 4.05 10*6/uL — AB (ref 4.20–5.82)
RDW: 15.8 % — ABNORMAL HIGH (ref 11.0–14.6)
WBC: 7.1 10*3/uL (ref 4.0–10.3)
lymph#: 0.5 10*3/uL — ABNORMAL LOW (ref 0.9–3.3)

## 2017-01-24 LAB — COMPREHENSIVE METABOLIC PANEL
ALT: 8 U/L (ref 0–55)
ANION GAP: 9 meq/L (ref 3–11)
AST: 10 U/L (ref 5–34)
Albumin: 3.1 g/dL — ABNORMAL LOW (ref 3.5–5.0)
Alkaline Phosphatase: 81 U/L (ref 40–150)
BILIRUBIN TOTAL: 0.36 mg/dL (ref 0.20–1.20)
BUN: 11.4 mg/dL (ref 7.0–26.0)
CHLORIDE: 104 meq/L (ref 98–109)
CO2: 26 meq/L (ref 22–29)
CREATININE: 0.7 mg/dL (ref 0.7–1.3)
Calcium: 9.3 mg/dL (ref 8.4–10.4)
GLUCOSE: 100 mg/dL (ref 70–140)
Potassium: 4.3 mEq/L (ref 3.5–5.1)
SODIUM: 139 meq/L (ref 136–145)
TOTAL PROTEIN: 6.9 g/dL (ref 6.4–8.3)

## 2017-01-24 LAB — CEA (IN HOUSE-CHCC): CEA (CHCC-IN HOUSE): 404.67 ng/mL — AB (ref 0.00–5.00)

## 2017-01-24 LAB — MAGNESIUM: Magnesium: 2 mg/dl (ref 1.5–2.5)

## 2017-01-24 MED ORDER — HEPARIN SOD (PORK) LOCK FLUSH 100 UNIT/ML IV SOLN
500.0000 [IU] | Freq: Once | INTRAVENOUS | Status: AC | PRN
  Administered 2017-01-24: 500 [IU] via INTRAVENOUS
  Filled 2017-01-24: qty 5

## 2017-01-24 MED ORDER — SODIUM CHLORIDE 0.9 % IJ SOLN
10.0000 mL | INTRAMUSCULAR | Status: DC | PRN
  Administered 2017-01-24: 10 mL via INTRAVENOUS
  Filled 2017-01-24: qty 10

## 2017-01-24 NOTE — Progress Notes (Addendum)
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 He continues to have pain at multiple sites, specifically the left knee and mid back region on the right side. He has also developed a painful "crick" along the right neck. He describes his appetite as "so-so". His wife reports he is "resting" 80% of the day. No fever. No shortness of breath.  Objective:  Vital signs in last 24 hours:  Blood pressure 116/74, pulse (!) 113, temperature 98.6 F (37 C), temperature source Oral, resp. rate 17, height '6\' 1"'$  (1.854 m), weight 163 lb 11.2 oz (74.3 kg), SpO2 99 %.    Resp: Lungs clear bilaterally. Cardio: Regular rate and rhythm. GI: Abdomen soft and nontender. No hepatomegaly. Vascular: Trace bilateral pretibial edema. Calves soft and nontender. Musculoskeletal: No obvious abnormality involving the left knee. Tender right upper/mid paraspinal region and lower scapula.  Port-A-Cath without erythema.  Lab Results:  Lab Results  Component Value Date   WBC 7.1 01/24/2017   HGB 10.7 (L) 01/24/2017   HCT 34.0 (L) 01/24/2017   MCV 84.0 01/24/2017   PLT 126 (L) 01/24/2017   NEUTROABS 5.6 01/24/2017    Imaging:  No results found.  Medications: I have reviewed the patient's current medications.  Assessment/Plan: 1.Adenocarcinoma of the distal esophagus/gastric cardia-status post an endoscopic biopsy on 03/18/2013 confirming adenocarcinoma, HER-2/neunot amplified  -Staging CT scans 03/20/2013 confirmed a distal esophageal/gastric cardia mass, distal paraesophageal lymph nodes and paratracheal nodes concerning for metastatic lymphadenopathy  -A PET scan 03/27/2013 with a multifocal area of hypermetabolic activity involving the thoracic esophagus extending into the gastric cardia and hypermetabolic mediastinal nodes, no distant metastatic disease  -Initiation of radiation 04/07/2013 and weekly  Taxol/carboplatin 04/08/2013, radiation completed 05/15/2013  -Restaging PET scan 06/09/2013-no evidence of metastatic disease, no hypermetabolic lymphadenopathy  -Esophagogastrectomy 07/07/2013 revealed a ypT1b,ypN0 tumor with negative surgical margins  -CT neck 06/09/2014 revealed a necrotic lymph node in the right tracheoesophageal groove  -PET scan 11/15/2014 revealed a single focus of hypermetabolism corresponding to the necrotic right upper mediastinum lymph node -EUS biopsy of the paraesophageal lymph node 06/25/2012 confirmed metastatic adenocarcinoma  -Radiation completed 08/11/2014 -Rising CEA May 2016 -PET scan 07/13/2015 with hypermetabolic right paratracheal and right hilar adenopathy -Cycle 1 FOLFOX 08/03/2015 -Cycle 2 FOLFOX 08/17/2015 - cycle 3 FOLFOX 08/31/2015 (oxaliplatin dose reduced secondary to thrombocytopenia)  -Cycle 4 FOLFOX 09/14/2015 -Cycle 5 FOLFOX 09/28/2015 - restaging CTs 09/30/2015 with stable disease involving neck/chest lymph nodes, a slight increase in size of left upper lobe lung nodule  - cycle 6 FOLFOX 10/12/2015  -FOLFOX discontinued secondary to development of bilateral vocal cord paralysis November 2016 - CT scan of the neck and chest on 11/22/15 revealed no definite disease in the neck. At the thoracic inlet region, there is questionably slight increase in prominence of soft tissue at the right tracheoesophageal groove. This could be an enlarging node or mass but could also relate to the esophagectomy and pull-through surgery.Enlarging lingular and left lower lobe nodules are worrisome for metastatic disease. Peribronchovascular nodular consolidation in the anterior right lower lobe, slightly more prominent, indeterminate. Cholelithiasis. Left diaphragmatic hernia, stable. -Xeloda 1 week on/1 week off beginning 12/11/2015 -Restaging CTs of the neck and chest 02/07/2016 revealed enlargement of 2 left-sided lung nodules, no other evidence of  disease progression -Cycle 1 Pembrolizumab 02/25/2016  -Cycle 2 Pembrolizumab 03/17/2016 -Cycle 3 Pembrolizumab 04/07/2016   -CT 04/20/2016 consistent with progressive disease involving a  large pericardial effusion, bilateral pleural effusions, enlarged mediastinal lymph nodes and lung nodules  Cytology from the pericardial fluid 04/21/2016-positive for metastatic adenocarcinoma -CT 05/01/2016 was slight worsening of right hilar lymphadenopathy with near complete collapse of the right middle lobe, nodular metastases in the left lung, stable bilateral pleural effusions  Cycle 1 Taxol/ramucirumab 05/12/2016; day 8 Taxol 05/19/2016; day 15 Taxol/ramucirumab 05/26/2016.  Cycle 2 day 1 Taxol/ramucirumab 06/22/2016  Cycle 2 day 8 Taxol 06/29/2016   Cycle 2 day 15 Taxol/ramucirumab07/28/2017  Cycle 3 day 1 Taxol/ramucirumab 07/20/2016  Cycle 3 day 8 Taxol 07/27/2016  Cycle 3 day 15 Taxol/ramucirumab08/24/2017  Restaging chest CT 08/10/2016-left upper lobe pulmonary nodule similar; left lower lobe pulmonary nodule may have enlarged minimally; improved appearance of the mediastinum and right infrahilar region with decreased soft tissue fullness; improvement in probable left infrahilar adenopathy; decreased left and resolved right pleural effusions; developing right adrenal nodularity; T7 vertebral body lesion suspicious for osseous metastasis.  Cycle 4 Taxol/ramucirumab09/06/2016   cycle 5 Taxol/ramucirumab10/03/2016  restaging chest CT 10/05/2016 with slight enlargement of mediastinal lymph nodes and pulmonary nodules, enlargement of left adrenal lesion,? Lymphatic tumor spread in the right lung  Cycle 6 Taxol/ ramucirumab11/01/2016; Taxol placed on hold beginning 10/26/2016 due to neuropathy symptoms, continuation of ramucirumab alone  Cycle 7Taxol/ ramucirumab11/30/2017-Taxol remains on hold  restaging chest CT 12/08/2016-minimal increase in size of a left hilar node and  left lung nodule. Mild increase in the size of a right adrenal metastasis  Cycle 8 Taxol/ ramucirumab01/03/2017-Taxol resumed01/10/2017  CT lumbar spine 12/28/2016 with infiltrative tumor at L3 with mild L3 pathologic fracture. S2 lytic lesion/metastasis. New 3.5 x 2.3 cm right adrenal mass.  Initiation of palliative radiation to the lumbar spine 01/01/2017; completed 01/12/2017  2. Indeterminate 15 mm right liver lesion on the CT scan 03/20/2013, MRI of the liver 06/09/2013 confirmed a right liver hemangioma  3. Anorexia/weight loss and solid dysphagia secondary to #1 -improved 4. History of a colon polyp, status post a polypectomy October 2013  5. history of Thrombocytopenia secondary chemotherapy-the carboplatin wase dose reduced with week 5 of chemotherapy chemotherapy  6. hoarseness secondary to right vocal cord paralysis-CT/PET scan imaging consistent with a malignant right paratracheal lymph node causing the vocal cord paralysis  -Status post a laryngeal plasty at [facility] on 09/02/2014 7. Esophageal stricture-status post dilation 06/08/2015 -Food impaction removal 09/29/2015 -Esophageal stricture dilation 10/06/2015 8. Port-A-Cath placement 07/20/2015 9. Delayed nausea following cycle 1 FOLFOX. Aloxi and Emend added with cycle 2. 10. Thrombocytopenia secondary to chemotherapy  11. Emotional lability/depression-prophylactic Decadron discontinued; Remeron initiated 12. Progressive hoarseness November 2016, diagnosed with bilateral vocal cord paralysis, status post a tracheostomy and bilateral injection laryngoplasty at [facility] on 10/27/2015 13. Pericardial window 04/21/2016  14. High fever 07/06/2016-chest x-ray suggestive of possible left lower lobe pneumonia-treated with Augmentin  15. 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 Taxol placed on hold beginning 10/26/2016.Lyrica added 11/23/2016 16. Depression-Remeron dose increased to 30 mg  11/23/2016 17. Back pain-potentially related to progressive esophagus cancer; CT 11/28/2016 with no bone lesion, bony distraction or fracture. Moderately severe lumbar facet arthritis greatest at L4-5 on the right. Good preservation of the intravertebral disc spaces. Stable, degenerative appearing, minimal anterolisthesis at L4-5.CT lumbar spine 12/28/2016-infiltrative tumor L3 with mild L3 pathologic fracture. S2 lytic lesion/metastasis. New 3.5 x 2.3 cm adrenal mass consistent with metastasis. Tumor mildly narrows the right L3-4 neural foramen. 18. Pain at multiple sites including the mid thoracic region, right groin, left knee. Referred  for a bone scan.    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 He and his wife understand the poor performance status is related to cancer.   He continues to have pain at multiple sites. He is scheduled to have a bone scan tomorrow. He will continue Dilaudid and oxycodone as needed. We will contact him with the results of the bone scan.  Dr. Benay Spice again reviewed options to include supportive/comfort care versus a trial of systemic therapy. 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 He understands the goal of chemotherapy is palliative and not curative. We reviewed 2 regimens, ELF and irinotecan/cisplatin. He will be scheduled to begin the ELF regimen on 01/31/2017 (daily 3). We reviewed potential toxicities associated with the chemotherapy including myelosuppression, allergic reaction, hypotension related to the etoposide, rash, hair loss, nausea, mouth sores, diarrhea. He is agreeable to proceed.  He will return for labs and a follow-up visit on 02/08/2017. He will contact the office in the interim with any problems.  Patient seen with Dr. Benay Spice. 25 minutes were spent face-to-face at today's visit with the majority of that time involved in counseling/coordination of care.  Ollis, Daudelin  ANP/GNP-BC   01/24/2017  9:41 AM This was a shared visit with Ned Card.  We discussed treatment options and recommended Hospice care. He would like to proceed with salvage chemotherapy.  We discussed irinotecan/cisplatin and the ELF regimen.  The plan is to proceed with ELF chemotherapy.    Julieanne Manson, MD

## 2017-01-24 NOTE — Progress Notes (Signed)
Gastroesophageal - No Medical Intervention - Off Treatment.  Patient Characteristics: Distant Metastases (cM1/pM1) / Locally Recurrent Disease, Adenocarcinoma - Esophageal, GE Junction, and Gastric, Third Line and Beyond, MSS/pMMR or MSI Unknown, PD?L1 Expression Negative / Unknown Histology: Adenocarcinoma Disease Classification: GE Junction Therapeutic Status: Distant Metastases (No Additional Staging) Line of therapy: Third Line and Beyond Would you be surprised if this patient died  in the next year? I would NOT be surprised if this patient died in the next year Microsatellite/Mismatch Repair Status: Unknown PD-L1 Expression Status: Unknown

## 2017-01-24 NOTE — Telephone Encounter (Signed)
Message sent to Joel Osborne to be added, per 01/24/17 los. Appointments scheduled per 01/24/17 los. Patient was given a copy of the AVS report and appointment schedule, per 01/24/17 los.

## 2017-01-25 ENCOUNTER — Encounter (HOSPITAL_COMMUNITY)
Admission: RE | Admit: 2017-01-25 | Discharge: 2017-01-25 | Disposition: A | Discharge status: Home / Self Care | Payer: BLUE CROSS/BLUE SHIELD | Source: Ambulatory Visit | Attending: Nurse Practitioner | Admitting: Nurse Practitioner

## 2017-01-25 ENCOUNTER — Telehealth: Payer: Self-pay | Admitting: *Deleted

## 2017-01-25 DIAGNOSIS — C155 Malignant neoplasm of lower third of esophagus: Secondary | ICD-10-CM | POA: Diagnosis not present

## 2017-01-25 MED ORDER — TECHNETIUM TC 99M MEDRONATE IV KIT
21.4000 | PACK | Freq: Once | INTRAVENOUS | Status: AC | PRN
  Administered 2017-01-25: 21.4 via INTRAVENOUS

## 2017-01-25 NOTE — Telephone Encounter (Signed)
Message from pt requesting prescription for lift chair. Having difficulty getting up from chair at home due to knee pain. Will review with MD for order.

## 2017-01-28 ENCOUNTER — Other Ambulatory Visit: Payer: Self-pay | Admitting: Oncology

## 2017-01-29 ENCOUNTER — Telehealth: Payer: Self-pay

## 2017-01-29 ENCOUNTER — Telehealth: Payer: Self-pay | Admitting: *Deleted

## 2017-01-29 DIAGNOSIS — C153 Malignant neoplasm of upper third of esophagus: Secondary | ICD-10-CM

## 2017-01-29 DIAGNOSIS — C155 Malignant neoplasm of lower third of esophagus: Secondary | ICD-10-CM

## 2017-01-29 MED ORDER — HYDROMORPHONE HCL 1 MG/ML PO LIQD: ORAL | 0 refills | Status: DC

## 2017-01-29 MED ORDER — CYCLOBENZAPRINE HCL 10 MG PO TABS: 10.0000 mg | ORAL_TABLET | Freq: Three times a day (TID) | ORAL | 0 refills | Status: DC | PRN

## 2017-01-29 MED FILL — CYCLOBENZAPRINE 10 MG TAB: 10 | 30 days supply | Qty: 90 | Fill #0

## 2017-01-29 NOTE — Telephone Encounter (Signed)
Message from pt reporting he has viewed the bone survey report. Asks if he should have radiation for the bone lesions. Reviewed with Dr. Benay Spice: There are increased side effects with combined therapy. MD plans to discuss case with Dr. Lisbeth Renshaw. Pt reports the knee is most painful. Then his mid back and L hip. Informed him the script for lift chair is available. Recommended he try [facility] to see if they will help file with insurance.

## 2017-01-29 NOTE — Telephone Encounter (Signed)
Pt called for bone scan results  He also needs refill on dilaudid and flexeril, please call when ready for pickup.

## 2017-01-30 MED FILL — HYDROMORPHONE 5 MG/5 ML SOL: 1 | 3 days supply | Qty: 60 | Fill #0

## 2017-01-31 ENCOUNTER — Telehealth: Payer: Self-pay | Admitting: *Deleted

## 2017-01-31 ENCOUNTER — Ambulatory Visit
Admission: RE | Admit: 2017-01-31 | Discharge: 2017-01-31 | Disposition: A | Discharge status: Home / Self Care | Payer: BLUE CROSS/BLUE SHIELD | Source: Ambulatory Visit | Attending: Radiation Oncology | Admitting: Radiation Oncology

## 2017-01-31 ENCOUNTER — Telehealth: Payer: Self-pay | Admitting: Radiation Oncology

## 2017-01-31 ENCOUNTER — Ambulatory Visit: Payer: BLUE CROSS/BLUE SHIELD

## 2017-01-31 ENCOUNTER — Encounter: Payer: Self-pay | Admitting: Radiation Oncology

## 2017-01-31 ENCOUNTER — Ambulatory Visit (HOSPITAL_BASED_OUTPATIENT_CLINIC_OR_DEPARTMENT_OTHER): Payer: BLUE CROSS/BLUE SHIELD

## 2017-01-31 ENCOUNTER — Other Ambulatory Visit (HOSPITAL_BASED_OUTPATIENT_CLINIC_OR_DEPARTMENT_OTHER): Payer: BLUE CROSS/BLUE SHIELD

## 2017-01-31 VITALS — BP 90/71 | HR 108 | Temp 98.5°F | Resp 18

## 2017-01-31 VITALS — Resp 18 | Ht 73.0 in | Wt 168.8 lb

## 2017-01-31 DIAGNOSIS — Z833 Family history of diabetes mellitus: Secondary | ICD-10-CM | POA: Insufficient documentation

## 2017-01-31 DIAGNOSIS — C155 Malignant neoplasm of lower third of esophagus: Secondary | ICD-10-CM

## 2017-01-31 DIAGNOSIS — Z5111 Encounter for antineoplastic chemotherapy: Secondary | ICD-10-CM

## 2017-01-31 DIAGNOSIS — C7951 Secondary malignant neoplasm of bone: Secondary | ICD-10-CM | POA: Insufficient documentation

## 2017-01-31 DIAGNOSIS — C7971 Secondary malignant neoplasm of right adrenal gland: Secondary | ICD-10-CM | POA: Diagnosis not present

## 2017-01-31 DIAGNOSIS — Z510 Encounter for antineoplastic radiation therapy: Secondary | ICD-10-CM | POA: Diagnosis not present

## 2017-01-31 DIAGNOSIS — Z923 Personal history of irradiation: Secondary | ICD-10-CM | POA: Diagnosis not present

## 2017-01-31 DIAGNOSIS — Z841 Family history of disorders of kidney and ureter: Secondary | ICD-10-CM | POA: Diagnosis not present

## 2017-01-31 DIAGNOSIS — Z8501 Personal history of malignant neoplasm of esophagus: Secondary | ICD-10-CM | POA: Diagnosis not present

## 2017-01-31 DIAGNOSIS — Z86010 Personal history of colonic polyps: Secondary | ICD-10-CM | POA: Diagnosis not present

## 2017-01-31 DIAGNOSIS — R7303 Prediabetes: Secondary | ICD-10-CM | POA: Insufficient documentation

## 2017-01-31 DIAGNOSIS — K219 Gastro-esophageal reflux disease without esophagitis: Secondary | ICD-10-CM | POA: Insufficient documentation

## 2017-01-31 DIAGNOSIS — F419 Anxiety disorder, unspecified: Secondary | ICD-10-CM | POA: Insufficient documentation

## 2017-01-31 DIAGNOSIS — Z95828 Presence of other vascular implants and grafts: Secondary | ICD-10-CM

## 2017-01-31 DIAGNOSIS — Z79899 Other long term (current) drug therapy: Secondary | ICD-10-CM | POA: Diagnosis not present

## 2017-01-31 DIAGNOSIS — Z8249 Family history of ischemic heart disease and other diseases of the circulatory system: Secondary | ICD-10-CM | POA: Diagnosis not present

## 2017-01-31 DIAGNOSIS — M25562 Pain in left knee: Secondary | ICD-10-CM | POA: Insufficient documentation

## 2017-01-31 DIAGNOSIS — G4730 Sleep apnea, unspecified: Secondary | ICD-10-CM | POA: Insufficient documentation

## 2017-01-31 DIAGNOSIS — E785 Hyperlipidemia, unspecified: Secondary | ICD-10-CM | POA: Diagnosis not present

## 2017-01-31 DIAGNOSIS — Z800 Family history of malignant neoplasm of digestive organs: Secondary | ICD-10-CM | POA: Insufficient documentation

## 2017-01-31 LAB — CBC WITH DIFFERENTIAL/PLATELET
BASO%: 0.5 % (ref 0.0–2.0)
Basophils Absolute: 0 10*3/uL (ref 0.0–0.1)
EOS%: 17.4 % — ABNORMAL HIGH (ref 0.0–7.0)
Eosinophils Absolute: 1.1 10*3/uL — ABNORMAL HIGH (ref 0.0–0.5)
HCT: 32.3 % — ABNORMAL LOW (ref 38.4–49.9)
HGB: 10.6 g/dL — ABNORMAL LOW (ref 13.0–17.1)
LYMPH%: 8.3 % — ABNORMAL LOW (ref 14.0–49.0)
MCH: 26.9 pg — ABNORMAL LOW (ref 27.2–33.4)
MCHC: 32.9 g/dL (ref 32.0–36.0)
MCV: 81.8 fL (ref 79.3–98.0)
MONO#: 0.5 10*3/uL (ref 0.1–0.9)
MONO%: 8.4 % (ref 0.0–14.0)
NEUT#: 4.2 10*3/uL (ref 1.5–6.5)
NEUT%: 65.4 % (ref 39.0–75.0)
Platelets: 165 10*3/uL (ref 140–400)
RBC: 3.95 10*6/uL — ABNORMAL LOW (ref 4.20–5.82)
RDW: 17.1 % — ABNORMAL HIGH (ref 11.0–14.6)
WBC: 6.5 10*3/uL (ref 4.0–10.3)
lymph#: 0.5 10*3/uL — ABNORMAL LOW (ref 0.9–3.3)

## 2017-01-31 LAB — COMPREHENSIVE METABOLIC PANEL
ALBUMIN: 3.1 g/dL — AB (ref 3.5–5.0)
ALK PHOS: 81 U/L (ref 40–150)
ALT: 7 U/L (ref 0–55)
AST: 11 U/L (ref 5–34)
Anion Gap: 10 mEq/L (ref 3–11)
BILIRUBIN TOTAL: 0.47 mg/dL (ref 0.20–1.20)
BUN: 11.5 mg/dL (ref 7.0–26.0)
CO2: 25 meq/L (ref 22–29)
CREATININE: 0.7 mg/dL (ref 0.7–1.3)
Calcium: 9.4 mg/dL (ref 8.4–10.4)
Chloride: 105 mEq/L (ref 98–109)
GLUCOSE: 135 mg/dL (ref 70–140)
Potassium: 4.2 mEq/L (ref 3.5–5.1)
SODIUM: 140 meq/L (ref 136–145)
TOTAL PROTEIN: 6.6 g/dL (ref 6.4–8.3)

## 2017-01-31 MED ORDER — FLUOROURACIL CHEMO INJECTION 2.5 GM/50ML
300.0000 mg/m2 | Freq: Once | INTRAVENOUS | Status: AC
  Administered 2017-01-31: 600 mg via INTRAVENOUS
  Filled 2017-01-31: qty 12

## 2017-01-31 MED ORDER — SODIUM CHLORIDE 0.9 % IV SOLN: 100.0000 mg/m2 | Freq: Once | INTRAVENOUS | Status: DC

## 2017-01-31 MED ORDER — LEUCOVORIN CALCIUM INJECTION 350 MG
300.0000 mg/m2 | Freq: Once | INTRAMUSCULAR | Status: AC
  Administered 2017-01-31: 588 mg via INTRAVENOUS
  Filled 2017-01-31: qty 29.4

## 2017-01-31 MED ORDER — PROCHLORPERAZINE MALEATE 10 MG PO TABS
10.0000 mg | ORAL_TABLET | Freq: Once | ORAL | Status: AC
  Administered 2017-01-31: 10 mg via ORAL

## 2017-01-31 MED ORDER — SODIUM CHLORIDE 0.9% FLUSH
10.0000 mL | INTRAVENOUS | Status: DC | PRN
  Administered 2017-01-31: 10 mL
  Filled 2017-01-31: qty 10

## 2017-01-31 MED ORDER — FLUOROURACIL CHEMO INJECTION 2.5 GM/50ML: 400.0000 mg/m2 | Freq: Once | INTRAVENOUS | Status: DC

## 2017-01-31 MED ORDER — ONDANSETRON HCL 4 MG/2ML IJ SOLN
8.0000 mg | Freq: Once | INTRAMUSCULAR | Status: AC
  Administered 2017-01-31: 8 mg via INTRAVENOUS

## 2017-01-31 MED ORDER — SODIUM CHLORIDE 0.9 % IV SOLN
Freq: Once | INTRAVENOUS | Status: AC
  Administered 2017-01-31: 10:00:00 via INTRAVENOUS

## 2017-01-31 MED ORDER — HEPARIN SOD (PORK) LOCK FLUSH 100 UNIT/ML IV SOLN
500.0000 [IU] | Freq: Once | INTRAVENOUS | Status: AC | PRN
  Administered 2017-01-31: 500 [IU]
  Filled 2017-01-31: qty 5

## 2017-01-31 MED ORDER — SODIUM CHLORIDE 0.9 % IV SOLN
80.0000 mg/m2 | Freq: Once | INTRAVENOUS | Status: AC
  Administered 2017-01-31: 160 mg via INTRAVENOUS
  Filled 2017-01-31: qty 8

## 2017-01-31 MED ORDER — ONDANSETRON HCL 8 MG PO TABS
ORAL_TABLET | ORAL | Status: AC
Start: 2017-01-31 — End: 2017-01-31
  Filled 2017-01-31: qty 8

## 2017-01-31 MED ORDER — ONDANSETRON HCL 4 MG/2ML IJ SOLN
INTRAMUSCULAR | Status: AC
  Filled 2017-01-31: qty 4

## 2017-01-31 MED ORDER — PROCHLORPERAZINE MALEATE 10 MG PO TABS
ORAL_TABLET | ORAL | Status: AC
  Filled 2017-01-31: qty 1

## 2017-01-31 MED ORDER — SODIUM CHLORIDE 0.9 % IJ SOLN
10.0000 mL | INTRAMUSCULAR | Status: DC | PRN
  Administered 2017-01-31: 10 mL via INTRAVENOUS
  Filled 2017-01-31: qty 10

## 2017-01-31 NOTE — Progress Notes (Signed)
Had a she does not need is not  Radiation Oncology         [phone] ________________________________  Name: Joel Osborne, Dr. 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 51 year-old gentleman with progressive metastatic disease involving the right adrenal gland and lumbosacral spine. He appears to be symptomatic from metastatic disease involving L3.    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 The patient completed radiation to the lumbar spine on 01/12/17. A whole body bone scan on 01/25/17 showed concerns for metastasis. There were multifocal areas of abnormal bony uptake including lower cervical spine, mid and lower thoracic and lumbar spine, left pubic bone, and left femur.  Patient reports 6-7/10 pain in the left knee that has been worsening over the past 2 weeks. The pain is chronic and aggravated with movement. He complains of secondary pain to his mid back, and left hip. He reports improvement to the lower back.   PREVIOUS RADIATION THERAPY: Yes; 01/01/17-01/12/17 30 Gy in 10 fractions to the L2-4 bone mets   PAST MEDICAL HISTORY:  Past Medical History:  Diagnosis Date  . Anxiety   . Colon polyp   . Diverticulosis   . Esophageal cancer ([place]) 03/18/2013   Adenocarcinoma  . GERD (gastroesophageal reflux disease)   . History of radiation therapy 04/07/13-05-15-13   [place] ca,50.4Gy/81f [place]  . Hyperlipemia   . Pre-diabetes   . Shortness of breath dyspnea   . Sleep apnea    no c-pap at this time       PAST SURGICAL HISTORY: Past Surgical History:  Procedure Laterality Date  . APPENDECTOMY    . BALLOON DILATION N/A 06/05/2016   Procedure: BALLOON DILATION;   Surgeon: JIrene Shipper MD;  [facility]  Service: Endoscopy;  Laterality: N/A;  . COLONOSCOPY    . COMPLETE ESOPHAGECTOMY N/A 07/07/2013   Procedure: ESOPHAGECTOMY COMPLETE;  Surgeon: EGrace Isaac MD;  [place] [facility]  Service: Thoracic;  Laterality: N/A;  transhiatel esophagectomy, feeding jejunostomy  . EDG  03/18/2013   Esophageal Mass  . ESOPHAGOGASTRODUODENOSCOPY N/A 07/02/2014   Procedure: ESOPHAGOGASTRODUODENOSCOPY (EGD);  Surgeon: CGatha Mayer MD;  [facility];  Service: Endoscopy;  Laterality: N/A;  . ESOPHAGOGASTRODUODENOSCOPY N/A 02/16/2015   Procedure: ESOPHAGOGASTRODUODENOSCOPY (EGD);  Surgeon: DLafayette Dragon MD;  [facility];  Service: Endoscopy;  Laterality: N/A;  . ESOPHAGOGASTRODUODENOSCOPY N/A 09/29/2015   Procedure: ESOPHAGOGASTRODUODENOSCOPY (EGD);  Surgeon: MLadene Artist MD;  [facility];  Service: Endoscopy;  Laterality: N/A;  . ESOPHAGOGASTRODUODENOSCOPY N/A 06/05/2016   Procedure: ESOPHAGOGASTRODUODENOSCOPY (EGD);  Surgeon: JIrene Shipper MD;  [facility]  Service: Endoscopy;  Laterality: N/A;  . EUS N/A 06/25/2014   Procedure: UPPER ENDOSCOPIC ULTRASOUND (EUS) LINEAR;  Surgeon: DMilus Banister MD;  [facility];  Service: Endoscopy;  Laterality: N/A;  . LIPOMA EXCISION    . SUBXYPHOID PERICARDIAL WINDOW N/A 04/21/2016   Procedure: SUBXYPHOID PERICARDIAL WINDOW;  Surgeon: SMelrose Nakayama MD;  [place] [facility]  Service: Thoracic;  Laterality: N/A;  . UPPER GASTROINTESTINAL ENDOSCOPY    . VIDEO BRONCHOSCOPY N/A 07/07/2013   Procedure: VIDEO BRONCHOSCOPY;  Surgeon: EGrace Isaac  MD;  [facility];  Service: Thoracic;  Laterality: N/A;  . vocal chord surgery     pushed right vocal cord more centered- September 2015     FAMILY HISTORY:  Family History  Problem Relation Age of Onset  . Colon cancer Mother     dx in her 63s  . Diabetes Mother   . Breast cancer Maternal Aunt 81  .  Diabetes Maternal Aunt   . Esophageal cancer Maternal Aunt 78  . Kidney failure Brother 88  . Hypertension Brother 57  . Stomach cancer Neg Hx   . Rectal cancer Neg Hx      SOCIAL HISTORY:  reports that he has never smoked. He has never used smokeless tobacco. He reports that he does not drink alcohol or use drugs.   ALLERGIES: Lactose intolerance (gi)   MEDICATIONS:  Current Outpatient Prescriptions  Medication Sig Dispense Refill  . acetaminophen (TYLENOL) 500 MG tablet Take 1,000 mg by mouth every 6 (six) hours as needed for mild pain or moderate pain.    Marland Kitchen ALPRAZolam (XANAX) 0.5 MG tablet TAKE 1 TABLET BY MOUTH 3 TIMES DAILY AS NEEDED FOR ANXIETY 60 tablet 0  . Ascorbic Acid (VITAMIN C) 1000 MG tablet Take 1,000 mg by mouth 3 (three) times daily.    . cholecalciferol (VITAMIN D) 1000 units tablet Take 1,000 Units by mouth daily.    . cyclobenzaprine (FLEXERIL) 10 MG tablet Take 1 tablet (10 mg total) by mouth 3 (three) times daily as needed for muscle spasms. 90 tablet 0  . HYDROmorphone HCl (DILAUDID) 1 MG/ML LIQD Take 4-6 mls (4-6 mg total) every 6 hrs as needed. Do not take with oxycodone 60 mL 0  . lactase (LACTAID) 3000 UNITS tablet Take 3,000 Units by mouth daily as needed (lactose intolerance). Reported on 05/12/2016    . lidocaine-prilocaine (EMLA) cream Apply 1 application topically as needed. Apply tablespoon to PAC 2 hours prior to stick and cover with plastic wrap 30 g 5  . mirtazapine (REMERON) 30 MG tablet TAKE 1 TABLET (30 MG TOTAL) BY MOUTH AT BEDTIME. 30 tablet 0  . polyethylene glycol (MIRALAX / GLYCOLAX) packet Take 17 g by mouth daily as needed.    . zolpidem (AMBIEN) 5 MG tablet Take 1 tablet (5 mg total) by mouth at bedtime as needed. for sleep 30 tablet 0  . benzonatate (TESSALON) 100 MG capsule Take 1 capsule (100 mg total) by mouth 3 (three) times daily as needed for cough. (Patient not taking: Reported on 01/31/2017) 90 capsule 1  . emollient (BIAFINE) cream  Apply 1 application topically daily.    Marland Kitchen HYDROcodone-acetaminophen (HYCET) 7.5-325 mg/15 ml solution Place 15-20 mls into feeding tube every 6 hrs as needed for moderate pain (Patient not taking: Reported on 01/18/2017) 240 mL 0  . ibuprofen (ADVIL,MOTRIN) 200 MG tablet Take 400 mg by mouth every 8 (eight) hours as needed for moderate pain. Reported on 05/12/2016    . oxyCODONE (ROXICODONE) 5 MG/5ML solution Take 5 mLs (5 mg total) by mouth every 4 (four) hours as needed for severe pain. (Patient not taking: Reported on 01/31/2017) 473 mL 0  . oxyCODONE-acetaminophen (ROXICET) 5-325 MG/5ML solution Take 5-10 mLs by mouth every 4 (four) hours as needed for severe pain. Do not take with hydromorphone (Patient not taking: Reported on 01/31/2017) 200 mL 0  . pregabalin (LYRICA) 50 MG capsule Take 1 capsule (50 mg total) by mouth 2 (two) times daily. (Patient not taking: Reported on 01/31/2017)  60 capsule 0  . sennosides-docusate sodium (SENOKOT-S) 8.6-50 MG tablet Take 1 tablet by mouth daily.     No current facility-administered medications for this encounter.      REVIEW OF SYSTEMS: On review of systems, the patient reports that he is doing well overall. He denies any chest pain, shortness of breath, cough, fevers, chills, night sweats, unintended weight changes. He denies any bowel or bladder disturbances, and denies abdominal pain, nausea or vomiting. He denies any new musculoskeletal or joint aches or pains. A complete review of systems is obtained and is otherwise negative.     PHYSICAL EXAM:  Wt Readings from Last 3 Encounters:  01/31/17 168 lb 12.8 oz (76.6 kg)  01/24/17 163 lb 11.2 oz (74.3 kg)  01/18/17 162 lb 1.6 oz (73.5 kg)   Temp Readings from Last 3 Encounters:  02/01/17 98.2 F (36.8 C) (Oral)  01/31/17 98.5 F (36.9 C) (Oral)  01/24/17 98.6 F (37 C) (Oral)   BP Readings from Last 3 Encounters:  02/01/17 122/71  01/31/17 90/71  01/24/17 116/74   Pulse Readings from Last 3  Encounters:  02/01/17 100  01/31/17 (!) 108  01/24/17 (!) 113   Pain Assessment Pain Score: 6  Pain Loc: Knee (left)/10  In general this is a well appearing caucasian male in no acute distress. He's alert and oriented x4 and appropriate throughout the examination. Cardiopulmonary assessment is negative for acute distress and he exhibits normal effort. Strength is decreased in the left lower extremity as compared to the right. Sensation is intact in bilateral lower extremities. No edema noted.  ECOG = 1  0 - Asymptomatic (Fully active, able to carry on all predisease activities without restriction)  1 - Symptomatic but completely ambulatory (Restricted in physically strenuous activity but ambulatory and able to carry out work of a light or sedentary nature. For example, light housework, office work)  2 - Symptomatic, <50% in bed during the day (Ambulatory and capable of all self care but unable to carry out any work activities. Up and about more than 50% of waking hours)  3 - Symptomatic, >50% in bed, but not bedbound (Capable of only limited self-care, confined to bed or chair 50% or more of waking hours)  4 - Bedbound (Completely disabled. Cannot carry on any self-care. Totally confined to bed or chair)  5 - Death   Eustace Pen MM, Creech RH, Tormey DC, et al. [phone]). "Toxicity and response criteria of the [facility]". [place] Oncol. 5 (6): 649-55    LABORATORY DATA:  Lab Results  Component Value Date   WBC 6.5 01/31/2017   HGB 10.6 (L) 01/31/2017   HCT 32.3 (L) 01/31/2017   MCV 81.8 01/31/2017   PLT 165 01/31/2017   Lab Results  Component Value Date   NA 140 01/31/2017   K 4.2 01/31/2017   CL 101 12/08/2016   CO2 25 01/31/2017   Lab Results  Component Value Date   ALT 7 01/31/2017   AST 11 01/31/2017   ALKPHOS 81 01/31/2017   BILITOT 0.47 01/31/2017      RADIOGRAPHY: Nm Bone Scan Whole Body  Result Date: 01/25/2017 CLINICAL DATA:   Esophageal cancer.  Mid to lower back pain. EXAM: NUCLEAR MEDICINE WHOLE BODY BONE SCAN TECHNIQUE: Whole body anterior and posterior images were obtained approximately 3 hours after intravenous injection of radiopharmaceutical. RADIOPHARMACEUTICALS:  21.4 mCi Technetium-16mMDP IV COMPARISON:  PET CT 06/15/2014 FINDINGS: Multifocal areas of abnormal uptake in the  skeleton. This includes in the lower cervical spine, mid thoracic spine and lower thoracic spine, mid to lower lumbar spine. Marked increased activity noted in the proximal left femur and in the distal left femur at the left knee. Possible increased activity noted in the pubic bone on the left. IMPRESSION: Multifocal areas of abnormal bony uptake including lower cervical spine, mid and lower thoracic and lumbar spine, left pubic bone, and left femur. Findings concerning for metastases. Plain film correlation may be helpful. Electronically Signed   By: Rolm Baptise M.D.   On: 01/25/2017 12:19       IMPRESSION/PLAN: 1. Recent bone scan reveals multifocal activity in the bones, most prominently in proximal and distal left femur. These areas correlate with the patient's pain. Radiation therapy to these areas would be beneficial as part of pain management. I anticipate approximately 8 treatments to the left leg. Patient will be scheduled for simulation and treatment planning at 9 am tomorrow.    ------------------------------------------------  [name], MD, PhD       It was created on their behalf by Bethann Humble, a trained medical scribe. The creation of this record is based on the scribe's personal observations and the provider's statements to them. This document has been checked and approved by the attending provider.

## 2017-01-31 NOTE — Telephone Encounter (Signed)
I spoke with the patient to let him know that we didn't see evidence of bony involvement of the right femur or tibia. He is having some pain in the right knee which he states may be from compensating. However, we will look closely at the right side on his simulation images. He states agreement and understanding.

## 2017-01-31 NOTE — Progress Notes (Signed)
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 He appears to be symptomatic from metastatic disease involving L3.       ICD-9-CM ICD-10-CM   1. Bone metastasis ([place]) 198.5 C79.51     Indication for treatment:  Curative       Radiation treatment dates:   01/01/2017 to 01/12/2017  Site/dose:   The L2-4 bone met was treated to 30 Gy in 10 fractions of 3 Gy per fraction.  Beams/energy:   3D // 10X, 15X     FINDINGS: 01/25/17:NM Bone scan Multifocal areas of abnormal uptake in the skeleton. This includes in the lower cervical spine, mid thoracic spine and lower thoracic spine, mid to lower lumbar spine. Marked increased activity noted in the proximal left femur and in the distal left femur at the left knee. Possible increased activity noted in the pubic bone on the left.  IMPRESSION: Multifocal areas of abnormal bony uptake including lower cervical spine, mid and lower thoracic and lumbar spine, left pubic bone, and left femur. Findings concerning for metastases. Plain film correlation may be helpful.   Infusion today in med/onc:   Palliative    ELF regimen  3.5 hours        Pt reports the knee is most painful. Then his mid back and L hip. C/o 6/7 on 10 scale left knee at present,  Weight in med/onc today  Sitting vitals:98.5,106/67,.99,18 right arm Standing vitals:90/71/108/18 Weight 168.8lb toay

## 2017-01-31 NOTE — Patient Instructions (Signed)
Etoposide, VP-16 injection What is this medicine? ETOPOSIDE, VP-16 (e toe POE side) is a chemotherapy drug. It is used to treat testicular cancer, lung cancer, and other cancers. This medicine may be used for other purposes; ask your health care provider or pharmacist if you have questions. COMMON BRAND NAME(S): Etopophos, Toposar, VePesid What should I tell my health care provider before I take this medicine? They need to know if you have any of these conditions: -infection -kidney disease -liver disease -low blood counts, like low white cell, platelet, or red cell counts -an unusual or allergic reaction to etoposide, other medicines, foods, dyes, or preservatives -pregnant or trying to get pregnant -breast-feeding How should I use this medicine? This medicine is for infusion into a vein. It is administered in a hospital or clinic by a specially trained health care professional. Talk to your pediatrician regarding the use of this medicine in children. Special care may be needed. Overdosage: If you think you have taken too much of this medicine contact a poison control center or emergency room at once. NOTE: This medicine is only for you. Do not share this medicine with others. What if I miss a dose? It is important not to miss your dose. Call your doctor or health care professional if you are unable to keep an appointment. What may interact with this medicine? -aspirin -certain medications for seizures like carbamazepine, phenobarbital, phenytoin, valproic acid -cyclosporine -levamisole -warfarin This list may not describe all possible interactions. Give your health care provider a list of all the medicines, herbs, non-prescription drugs, or dietary supplements you use. Also tell them if you smoke, drink alcohol, or use illegal drugs. Some items may interact with your medicine. What should I watch for while using this medicine? Visit your doctor for checks on your progress. This drug  may make you feel generally unwell. This is not uncommon, as chemotherapy can affect healthy cells as well as cancer cells. Report any side effects. Continue your course of treatment even though you feel ill unless your doctor tells you to stop. In some cases, you may be given additional medicines to help with side effects. Follow all directions for their use. Call your doctor or health care professional for advice if you get a fever, chills or sore throat, or other symptoms of a cold or flu. Do not treat yourself. This drug decreases your body's ability to fight infections. Try to avoid being around people who are sick. This medicine may increase your risk to bruise or bleed. Call your doctor or health care professional if you notice any unusual bleeding. Talk to your doctor about your risk of cancer. You may be more at risk for certain types of cancers if you take this medicine. Do not become pregnant while taking this medicine or for at least 6 months after stopping it. Women should inform their doctor if they wish to become pregnant or think they might be pregnant. Women of child-bearing potential will need to have a negative pregnancy test before starting this medicine. There is a potential for serious side effects to an unborn child. Talk to your health care professional or pharmacist for more information. Do not breast-feed an infant while taking this medicine. Men must use a latex condom during sexual contact with a woman while taking this medicine and for at least 4 months after stopping it. A latex condom is needed even if you have had a vasectomy. Contact your doctor right away if your partner becomes pregnant. Do   not donate sperm while taking this medicine and for at least 4 months after you stop taking this medicine. Men should inform their doctors if they wish to father a child. This medicine may lower sperm counts. What side effects may I notice from receiving this medicine? Side effects that  you should report to your doctor or health care professional as soon as possible: -allergic reactions like skin rash, itching or hives, swelling of the face, lips, or tongue -low blood counts - this medicine may decrease the number of white blood cells, red blood cells and platelets. You may be at increased risk for infections and bleeding. -signs of infection - fever or chills, cough, sore throat, pain or difficulty passing urine -signs of decreased platelets or bleeding - bruising, pinpoint red spots on the skin, black, tarry stools, blood in the urine -signs of decreased red blood cells - unusually weak or tired, fainting spells, lightheadedness -breathing problems -changes in vision -mouth or throat sores or ulcers -pain, redness, swelling or irritation at the injection site -pain, tingling, numbness in the hands or feet -redness, blistering, peeling or loosening of the skin, including inside the mouth -seizures -vomiting Side effects that usually do not require medical attention (report to your doctor or health care professional if they continue or are bothersome): -diarrhea -hair loss -loss of appetite -nausea -stomach pain This list may not describe all possible side effects. Call your doctor for medical advice about side effects. You may report side effects to FDA at 1-800-FDA-1088. Where should I keep my medicine? This drug is given in a hospital or clinic and will not be stored at home. NOTE: This sheet is a summary. It may not cover all possible information. If you have questions about this medicine, talk to your doctor, pharmacist, or health care provider.  2017 Elsevier/Gold Standard (2015-11-19 11:53:23)  Leucovorin injection What is this medicine? LEUCOVORIN (loo koe VOR in) is used to prevent or treat the harmful effects of some medicines. This medicine is used to treat anemia caused by a low amount of folic acid in the body. It is also used with 5-fluorouracil (5-FU) to  treat colon cancer. This medicine may be used for other purposes; ask your health care provider or pharmacist if you have questions. What should I tell my health care provider before I take this medicine? They need to know if you have any of these conditions: -anemia from low levels of vitamin B-12 in the blood -an unusual or allergic reaction to leucovorin, folic acid, other medicines, foods, dyes, or preservatives -pregnant or trying to get pregnant -breast-feeding How should I use this medicine? This medicine is for injection into a muscle or into a vein. It is given by a health care professional in a hospital or clinic setting. Talk to your pediatrician regarding the use of this medicine in children. Special care may be needed. Overdosage: If you think you have taken too much of this medicine contact a poison control center or emergency room at once. NOTE: This medicine is only for you. Do not share this medicine with others. What if I miss a dose? This does not apply. What may interact with this medicine? -capecitabine -fluorouracil -phenobarbital -phenytoin -primidone -trimethoprim-sulfamethoxazole This list may not describe all possible interactions. Give your health care provider a list of all the medicines, herbs, non-prescription drugs, or dietary supplements you use. Also tell them if you smoke, drink alcohol, or use illegal drugs. Some items may interact with your medicine. What  should I watch for while using this medicine? Your condition will be monitored carefully while you are receiving this medicine. This medicine may increase the side effects of 5-fluorouracil, 5-FU. Tell your doctor or health care professional if you have diarrhea or mouth sores that do not get better or that get worse. What side effects may I notice from receiving this medicine? Side effects that you should report to your doctor or health care professional as soon as possible: -allergic reactions like  skin rash, itching or hives, swelling of the face, lips, or tongue -breathing problems -fever, infection -mouth sores -unusual bleeding or bruising -unusually weak or tired Side effects that usually do not require medical attention (report to your doctor or health care professional if they continue or are bothersome): -constipation or diarrhea -loss of appetite -nausea, vomiting This list may not describe all possible side effects. Call your doctor for medical advice about side effects. You may report side effects to FDA at 1-800-FDA-1088. Where should I keep my medicine? This drug is given in a hospital or clinic and will not be stored at home. NOTE: This sheet is a summary. It may not cover all possible information. If you have questions about this medicine, talk to your doctor, pharmacist, or health care provider.  2017 Elsevier/Gold Standard (2008-06-02 16:50:29)  Fluorouracil, 5-FU injection What is this medicine? FLUOROURACIL, 5-FU (flure oh YOOR a sil) is a chemotherapy drug. It slows the growth of cancer cells. This medicine is used to treat many types of cancer like breast cancer, colon or rectal cancer, pancreatic cancer, and stomach cancer. This medicine may be used for other purposes; ask your health care provider or pharmacist if you have questions. COMMON BRAND NAME(S): Adrucil What should I tell my health care provider before I take this medicine? They need to know if you have any of these conditions: -blood disorders -dihydropyrimidine dehydrogenase (DPD) deficiency -infection (especially a virus infection such as chickenpox, cold sores, or herpes) -kidney disease -liver disease -malnourished, poor nutrition -recent or ongoing radiation therapy -an unusual or allergic reaction to fluorouracil, other chemotherapy, other medicines, foods, dyes, or preservatives -pregnant or trying to get pregnant -breast-feeding How should I use this medicine? This drug is given as an  infusion or injection into a vein. It is administered in a hospital or clinic by a specially trained health care professional. Talk to your pediatrician regarding the use of this medicine in children. Special care may be needed. Overdosage: If you think you have taken too much of this medicine contact a poison control center or emergency room at once. NOTE: This medicine is only for you. Do not share this medicine with others. What if I miss a dose? It is important not to miss your dose. Call your doctor or health care professional if you are unable to keep an appointment. What may interact with this medicine? -allopurinol -cimetidine -dapsone -digoxin -hydroxyurea -leucovorin -levamisole -medicines for seizures like ethotoin, fosphenytoin, phenytoin -medicines to increase blood counts like filgrastim, pegfilgrastim, sargramostim -medicines that treat or prevent blood clots like warfarin, enoxaparin, and dalteparin -methotrexate -metronidazole -pyrimethamine -some other chemotherapy drugs like busulfan, cisplatin, estramustine, vinblastine -trimethoprim -trimetrexate -vaccines Talk to your doctor or health care professional before taking any of these medicines: -acetaminophen -aspirin -ibuprofen -ketoprofen -naproxen This list may not describe all possible interactions. Give your health care provider a list of all the medicines, herbs, non-prescription drugs, or dietary supplements you use. Also tell them if you smoke, drink alcohol, or use  illegal drugs. Some items may interact with your medicine. What should I watch for while using this medicine? Visit your doctor for checks on your progress. This drug may make you feel generally unwell. This is not uncommon, as chemotherapy can affect healthy cells as well as cancer cells. Report any side effects. Continue your course of treatment even though you feel ill unless your doctor tells you to stop. In some cases, you may be given  additional medicines to help with side effects. Follow all directions for their use. Call your doctor or health care professional for advice if you get a fever, chills or sore throat, or other symptoms of a cold or flu. Do not treat yourself. This drug decreases your body's ability to fight infections. Try to avoid being around people who are sick. This medicine may increase your risk to bruise or bleed. Call your doctor or health care professional if you notice any unusual bleeding. Be careful brushing and flossing your teeth or using a toothpick because you may get an infection or bleed more easily. If you have any dental work done, tell your dentist you are receiving this medicine. Avoid taking products that contain aspirin, acetaminophen, ibuprofen, naproxen, or ketoprofen unless instructed by your doctor. These medicines may hide a fever. Do not become pregnant while taking this medicine. Women should inform their doctor if they wish to become pregnant or think they might be pregnant. There is a potential for serious side effects to an unborn child. Talk to your health care professional or pharmacist for more information. Do not breast-feed an infant while taking this medicine. Men should inform their doctor if they wish to father a child. This medicine may lower sperm counts. Do not treat diarrhea with over the counter products. Contact your doctor if you have diarrhea that lasts more than 2 days or if it is severe and watery. This medicine can make you more sensitive to the sun. Keep out of the sun. If you cannot avoid being in the sun, wear protective clothing and use sunscreen. Do not use sun lamps or tanning beds/booths. What side effects may I notice from receiving this medicine? Side effects that you should report to your doctor or health care professional as soon as possible: -allergic reactions like skin rash, itching or hives, swelling of the face, lips, or tongue -low blood counts - this  medicine may decrease the number of white blood cells, red blood cells and platelets. You may be at increased risk for infections and bleeding. -signs of infection - fever or chills, cough, sore throat, pain or difficulty passing urine -signs of decreased platelets or bleeding - bruising, pinpoint red spots on the skin, black, tarry stools, blood in the urine -signs of decreased red blood cells - unusually weak or tired, fainting spells, lightheadedness -breathing problems -changes in vision -chest pain -mouth sores -nausea and vomiting -pain, swelling, redness at site where injected -pain, tingling, numbness in the hands or feet -redness, swelling, or sores on hands or feet -stomach pain -unusual bleeding Side effects that usually do not require medical attention (report to your doctor or health care professional if they continue or are bothersome): -changes in finger or toe nails -diarrhea -dry or itchy skin -hair loss -headache -loss of appetite -sensitivity of eyes to the light -stomach upset -unusually teary eyes This list may not describe all possible side effects. Call your doctor for medical advice about side effects. You may report side effects to FDA at 1-800-FDA-1088.  Where should I keep my medicine? This drug is given in a hospital or clinic and will not be stored at home. NOTE: This sheet is a summary. It may not cover all possible information. If you have questions about this medicine, talk to your doctor, pharmacist, or health care provider.  2017 Elsevier/Gold Standard (2008-04-01 13:53:16)

## 2017-01-31 NOTE — Telephone Encounter (Signed)
Patient  Phone message  Left,called    requesting Dr. Lisbeth Renshaw to call him back about radiating his right kneee also, his phone number uis [phone], will forward to MD 4:10 PM

## 2017-02-01 ENCOUNTER — Ambulatory Visit (HOSPITAL_BASED_OUTPATIENT_CLINIC_OR_DEPARTMENT_OTHER): Payer: BLUE CROSS/BLUE SHIELD

## 2017-02-01 ENCOUNTER — Ambulatory Visit
Admission: RE | Admit: 2017-02-01 | Discharge: 2017-02-01 | Disposition: A | Discharge status: Home / Self Care | Payer: BLUE CROSS/BLUE SHIELD | Source: Ambulatory Visit | Attending: Radiation Oncology | Admitting: Radiation Oncology

## 2017-02-01 VITALS — BP 122/71 | HR 100 | Temp 98.2°F | Resp 18

## 2017-02-01 DIAGNOSIS — C7951 Secondary malignant neoplasm of bone: Secondary | ICD-10-CM

## 2017-02-01 DIAGNOSIS — Z5111 Encounter for antineoplastic chemotherapy: Secondary | ICD-10-CM

## 2017-02-01 DIAGNOSIS — C155 Malignant neoplasm of lower third of esophagus: Secondary | ICD-10-CM | POA: Diagnosis not present

## 2017-02-01 DIAGNOSIS — Z510 Encounter for antineoplastic radiation therapy: Secondary | ICD-10-CM | POA: Diagnosis not present

## 2017-02-01 MED ORDER — ACETAMINOPHEN 325 MG PO TABS
ORAL_TABLET | ORAL | Status: AC
  Filled 2017-02-01: qty 2

## 2017-02-01 MED ORDER — ACETAMINOPHEN 325 MG PO TABS
650.0000 mg | ORAL_TABLET | Freq: Once | ORAL | Status: AC
  Administered 2017-02-01: 650 mg via ORAL

## 2017-02-01 MED ORDER — SODIUM CHLORIDE 0.9 % IV SOLN
Freq: Once | INTRAVENOUS | Status: AC
  Administered 2017-02-01: 12:00:00 via INTRAVENOUS

## 2017-02-01 MED ORDER — ONDANSETRON HCL 4 MG/2ML IJ SOLN
8.0000 mg | Freq: Once | INTRAMUSCULAR | Status: AC
  Administered 2017-02-01: 8 mg via INTRAVENOUS

## 2017-02-01 MED ORDER — PROCHLORPERAZINE MALEATE 10 MG PO TABS
10.0000 mg | ORAL_TABLET | Freq: Once | ORAL | Status: AC
  Administered 2017-02-01: 10 mg via ORAL

## 2017-02-01 MED ORDER — SODIUM CHLORIDE 0.9% FLUSH
10.0000 mL | INTRAVENOUS | Status: DC | PRN
  Administered 2017-02-01: 10 mL
  Filled 2017-02-01: qty 10

## 2017-02-01 MED ORDER — ETOPOSIDE CHEMO INJECTION 1 GM/50ML
80.0000 mg/m2 | Freq: Once | INTRAVENOUS | Status: AC
  Administered 2017-02-01: 160 mg via INTRAVENOUS
  Filled 2017-02-01: qty 8

## 2017-02-01 MED ORDER — FLUOROURACIL CHEMO INJECTION 2.5 GM/50ML
300.0000 mg/m2 | Freq: Once | INTRAVENOUS | Status: AC
Start: 2017-02-01 — End: 2017-02-01
  Administered 2017-02-01: 600 mg via INTRAVENOUS
  Filled 2017-02-01: qty 12

## 2017-02-01 MED ORDER — HEPARIN SOD (PORK) LOCK FLUSH 100 UNIT/ML IV SOLN
500.0000 [IU] | Freq: Once | INTRAVENOUS | Status: AC | PRN
  Administered 2017-02-01: 500 [IU]
  Filled 2017-02-01: qty 5

## 2017-02-01 MED ORDER — PROCHLORPERAZINE MALEATE 10 MG PO TABS
ORAL_TABLET | ORAL | Status: AC
  Filled 2017-02-01: qty 1

## 2017-02-01 MED ORDER — LEUCOVORIN CALCIUM INJECTION 350 MG
300.0000 mg/m2 | Freq: Once | INTRAMUSCULAR | Status: AC
  Administered 2017-02-01: 588 mg via INTRAVENOUS
  Filled 2017-02-01: qty 29.4

## 2017-02-01 MED ORDER — ONDANSETRON HCL 4 MG/2ML IJ SOLN
INTRAMUSCULAR | Status: AC
  Filled 2017-02-01: qty 4

## 2017-02-01 NOTE — Patient Instructions (Signed)
[facility] Discharge Instructions for Patients Receiving Chemotherapy  Today you received the following chemotherapy agents: Etoposide, Adrucil and Leocovorin.  To help prevent nausea and vomiting after your treatment, we encourage you to take your nausea medication as directed.   If you develop nausea and vomiting that is not controlled by your nausea medication, call the clinic.   BELOW ARE SYMPTOMS THAT SHOULD BE REPORTED IMMEDIATELY:  *FEVER GREATER THAN 100.5 F  *CHILLS WITH OR WITHOUT FEVER  NAUSEA AND VOMITING THAT IS NOT CONTROLLED WITH YOUR NAUSEA MEDICATION  *UNUSUAL SHORTNESS OF BREATH  *UNUSUAL BRUISING OR BLEEDING  TENDERNESS IN MOUTH AND THROAT WITH OR WITHOUT PRESENCE OF ULCERS  *URINARY PROBLEMS  *BOWEL PROBLEMS  UNUSUAL RASH Items with * indicate a potential emergency and should be followed up as soon as possible.  Feel free to call the clinic you have any questions or concerns. The clinic phone number is [phone].  Please show the [place] at check-in to the Emergency Department and triage nurse.

## 2017-02-02 ENCOUNTER — Ambulatory Visit (HOSPITAL_BASED_OUTPATIENT_CLINIC_OR_DEPARTMENT_OTHER): Payer: BLUE CROSS/BLUE SHIELD

## 2017-02-02 VITALS — BP 108/65 | HR 100 | Temp 98.5°F | Resp 18

## 2017-02-02 DIAGNOSIS — C155 Malignant neoplasm of lower third of esophagus: Secondary | ICD-10-CM

## 2017-02-02 DIAGNOSIS — Z5189 Encounter for other specified aftercare: Secondary | ICD-10-CM | POA: Diagnosis not present

## 2017-02-02 DIAGNOSIS — Z510 Encounter for antineoplastic radiation therapy: Secondary | ICD-10-CM | POA: Diagnosis not present

## 2017-02-02 DIAGNOSIS — Z5111 Encounter for antineoplastic chemotherapy: Secondary | ICD-10-CM | POA: Diagnosis not present

## 2017-02-02 MED ORDER — LEUCOVORIN CALCIUM INJECTION 350 MG
300.0000 mg/m2 | Freq: Once | INTRAMUSCULAR | Status: AC
Start: 2017-02-02 — End: 2017-02-02
  Administered 2017-02-02: 588 mg via INTRAVENOUS
  Filled 2017-02-02: qty 29.4

## 2017-02-02 MED ORDER — ONDANSETRON HCL 4 MG/2ML IJ SOLN
INTRAMUSCULAR | Status: AC
  Filled 2017-02-02: qty 4

## 2017-02-02 MED ORDER — PROCHLORPERAZINE MALEATE 10 MG PO TABS
ORAL_TABLET | ORAL | Status: AC
  Filled 2017-02-02: qty 1

## 2017-02-02 MED ORDER — SODIUM CHLORIDE 0.9 % IV SOLN
Freq: Once | INTRAVENOUS | Status: AC
  Administered 2017-02-02: 11:00:00 via INTRAVENOUS

## 2017-02-02 MED ORDER — PEGFILGRASTIM 6 MG/0.6ML ~~LOC~~ PSKT
6.0000 mg | PREFILLED_SYRINGE | Freq: Once | SUBCUTANEOUS | Status: AC
  Administered 2017-02-02: 6 mg via SUBCUTANEOUS
  Filled 2017-02-02: qty 0.6

## 2017-02-02 MED ORDER — HYDROMORPHONE HCL 4 MG/ML IJ SOLN
2.0000 mg | Freq: Once | INTRAMUSCULAR | Status: AC
  Administered 2017-02-02: 2 mg via INTRAVENOUS

## 2017-02-02 MED ORDER — IBUPROFEN 200 MG PO TABS
400.0000 mg | ORAL_TABLET | Freq: Once | ORAL | Status: AC
  Administered 2017-02-02: 400 mg via ORAL

## 2017-02-02 MED ORDER — FLUOROURACIL CHEMO INJECTION 2.5 GM/50ML
300.0000 mg/m2 | Freq: Once | INTRAVENOUS | Status: AC
Start: 2017-02-02 — End: 2017-02-02
  Administered 2017-02-02: 600 mg via INTRAVENOUS
  Filled 2017-02-02: qty 12

## 2017-02-02 MED ORDER — ACETAMINOPHEN 325 MG PO TABS
ORAL_TABLET | ORAL | Status: AC
Start: 2017-02-02 — End: 2017-02-02
  Filled 2017-02-02: qty 2

## 2017-02-02 MED ORDER — PROCHLORPERAZINE MALEATE 10 MG PO TABS
10.0000 mg | ORAL_TABLET | Freq: Once | ORAL | Status: AC
  Administered 2017-02-02: 10 mg via ORAL

## 2017-02-02 MED ORDER — HYDROMORPHONE HCL 4 MG/ML IJ SOLN
INTRAMUSCULAR | Status: AC
  Filled 2017-02-02: qty 1

## 2017-02-02 MED ORDER — SODIUM CHLORIDE 0.9 % IV SOLN
80.0000 mg/m2 | Freq: Once | INTRAVENOUS | Status: AC
  Administered 2017-02-02: 160 mg via INTRAVENOUS
  Filled 2017-02-02: qty 8

## 2017-02-02 MED ORDER — HEPARIN SOD (PORK) LOCK FLUSH 100 UNIT/ML IV SOLN
500.0000 [IU] | Freq: Once | INTRAVENOUS | Status: AC | PRN
  Administered 2017-02-02: 500 [IU]
  Filled 2017-02-02: qty 5

## 2017-02-02 MED ORDER — ONDANSETRON HCL 4 MG/2ML IJ SOLN
8.0000 mg | Freq: Once | INTRAMUSCULAR | Status: AC
  Administered 2017-02-02: 8 mg via INTRAVENOUS

## 2017-02-02 MED ORDER — SODIUM CHLORIDE 0.9% FLUSH
10.0000 mL | INTRAVENOUS | Status: DC | PRN
  Administered 2017-02-02: 10 mL
  Filled 2017-02-02: qty 10

## 2017-02-02 MED ORDER — ACETAMINOPHEN 325 MG PO TABS
650.0000 mg | ORAL_TABLET | Freq: Once | ORAL | Status: AC
  Administered 2017-02-02: 650 mg via ORAL

## 2017-02-02 MED ORDER — IBUPROFEN 200 MG PO TABS
ORAL_TABLET | ORAL | Status: AC
  Filled 2017-02-02: qty 2

## 2017-02-02 NOTE — Patient Instructions (Signed)
[facility] Discharge Instructions for Patients Receiving Chemotherapy  Today you received the following chemotherapy agents: Etoposide, Adrucil and Leocovorin.  To help prevent nausea and vomiting after your treatment, we encourage you to take your nausea medication as directed.   If you develop nausea and vomiting that is not controlled by your nausea medication, call the clinic.   BELOW ARE SYMPTOMS THAT SHOULD BE REPORTED IMMEDIATELY:  *FEVER GREATER THAN 100.5 F  *CHILLS WITH OR WITHOUT FEVER  NAUSEA AND VOMITING THAT IS NOT CONTROLLED WITH YOUR NAUSEA MEDICATION  *UNUSUAL SHORTNESS OF BREATH  *UNUSUAL BRUISING OR BLEEDING  TENDERNESS IN MOUTH AND THROAT WITH OR WITHOUT PRESENCE OF ULCERS  *URINARY PROBLEMS  *BOWEL PROBLEMS  UNUSUAL RASH Items with * indicate a potential emergency and should be followed up as soon as possible.  Feel free to call the clinic you have any questions or concerns. The clinic phone number is [phone].  Please show the [place] at check-in to the Emergency Department and triage nurse.

## 2017-02-05 ENCOUNTER — Ambulatory Visit
Admission: RE | Admit: 2017-02-05 | Discharge: 2017-02-05 | Disposition: A | Discharge status: Home / Self Care | Payer: BLUE CROSS/BLUE SHIELD | Source: Ambulatory Visit | Attending: Radiation Oncology | Admitting: Radiation Oncology

## 2017-02-05 DIAGNOSIS — Z510 Encounter for antineoplastic radiation therapy: Secondary | ICD-10-CM | POA: Diagnosis not present

## 2017-02-06 ENCOUNTER — Ambulatory Visit
Admission: RE | Admit: 2017-02-06 | Discharge: 2017-02-06 | Disposition: A | Discharge status: Home / Self Care | Payer: BLUE CROSS/BLUE SHIELD | Source: Ambulatory Visit | Attending: Radiation Oncology | Admitting: Radiation Oncology

## 2017-02-06 DIAGNOSIS — Z510 Encounter for antineoplastic radiation therapy: Secondary | ICD-10-CM | POA: Diagnosis not present

## 2017-02-07 ENCOUNTER — Ambulatory Visit
Admission: RE | Admit: 2017-02-07 | Discharge: 2017-02-07 | Disposition: A | Discharge status: Home / Self Care | Payer: BLUE CROSS/BLUE SHIELD | Source: Ambulatory Visit | Attending: Radiation Oncology | Admitting: Radiation Oncology

## 2017-02-07 DIAGNOSIS — Z510 Encounter for antineoplastic radiation therapy: Secondary | ICD-10-CM | POA: Diagnosis not present

## 2017-02-08 ENCOUNTER — Ambulatory Visit: Payer: BLUE CROSS/BLUE SHIELD | Admitting: Radiation Oncology

## 2017-02-08 ENCOUNTER — Ambulatory Visit (HOSPITAL_BASED_OUTPATIENT_CLINIC_OR_DEPARTMENT_OTHER): Payer: BLUE CROSS/BLUE SHIELD | Admitting: Oncology

## 2017-02-08 ENCOUNTER — Ambulatory Visit (HOSPITAL_BASED_OUTPATIENT_CLINIC_OR_DEPARTMENT_OTHER): Payer: BLUE CROSS/BLUE SHIELD

## 2017-02-08 ENCOUNTER — Ambulatory Visit
Admission: RE | Admit: 2017-02-08 | Discharge: 2017-02-08 | Disposition: A | Discharge status: Home / Self Care | Payer: BLUE CROSS/BLUE SHIELD | Source: Ambulatory Visit | Attending: Radiation Oncology | Admitting: Radiation Oncology

## 2017-02-08 ENCOUNTER — Other Ambulatory Visit (HOSPITAL_BASED_OUTPATIENT_CLINIC_OR_DEPARTMENT_OTHER): Payer: BLUE CROSS/BLUE SHIELD

## 2017-02-08 DIAGNOSIS — C7951 Secondary malignant neoplasm of bone: Secondary | ICD-10-CM

## 2017-02-08 DIAGNOSIS — G893 Neoplasm related pain (acute) (chronic): Secondary | ICD-10-CM

## 2017-02-08 DIAGNOSIS — G4700 Insomnia, unspecified: Secondary | ICD-10-CM

## 2017-02-08 DIAGNOSIS — C153 Malignant neoplasm of upper third of esophagus: Secondary | ICD-10-CM

## 2017-02-08 DIAGNOSIS — Z95828 Presence of other vascular implants and grafts: Secondary | ICD-10-CM

## 2017-02-08 DIAGNOSIS — C155 Malignant neoplasm of lower third of esophagus: Secondary | ICD-10-CM

## 2017-02-08 DIAGNOSIS — D696 Thrombocytopenia, unspecified: Secondary | ICD-10-CM

## 2017-02-08 DIAGNOSIS — Z510 Encounter for antineoplastic radiation therapy: Secondary | ICD-10-CM | POA: Diagnosis not present

## 2017-02-08 LAB — CBC WITH DIFFERENTIAL/PLATELET
BASO%: 0.5 % (ref 0.0–2.0)
Basophils Absolute: 0 10*3/uL (ref 0.0–0.1)
EOS ABS: 0.6 10*3/uL — AB (ref 0.0–0.5)
EOS%: 10.1 % — AB (ref 0.0–7.0)
HCT: 29.8 % — ABNORMAL LOW (ref 38.4–49.9)
HEMOGLOBIN: 9.3 g/dL — AB (ref 13.0–17.1)
LYMPH%: 8.6 % — ABNORMAL LOW (ref 14.0–49.0)
MCH: 26.2 pg — ABNORMAL LOW (ref 27.2–33.4)
MCHC: 31.2 g/dL — ABNORMAL LOW (ref 32.0–36.0)
MCV: 83.9 fL (ref 79.3–98.0)
MONO#: 0.5 10*3/uL (ref 0.1–0.9)
MONO%: 9.7 % (ref 0.0–14.0)
NEUT%: 71.1 % (ref 39.0–75.0)
NEUTROS ABS: 3.9 10*3/uL (ref 1.5–6.5)
Platelets: 81 10*3/uL — ABNORMAL LOW (ref 140–400)
RBC: 3.55 10*6/uL — ABNORMAL LOW (ref 4.20–5.82)
RDW: 16 % — AB (ref 11.0–14.6)
WBC: 5.5 10*3/uL (ref 4.0–10.3)
lymph#: 0.5 10*3/uL — ABNORMAL LOW (ref 0.9–3.3)

## 2017-02-08 MED ORDER — SODIUM CHLORIDE 0.9 % IJ SOLN
10.0000 mL | INTRAMUSCULAR | Status: DC | PRN
  Administered 2017-02-08: 10 mL via INTRAVENOUS
  Filled 2017-02-08: qty 10

## 2017-02-08 MED ORDER — OXYCODONE HCL 5 MG/5ML PO SOLN: 5.0000 mg | ORAL | 0 refills | Status: DC | PRN

## 2017-02-08 MED ORDER — HEPARIN SOD (PORK) LOCK FLUSH 100 UNIT/ML IV SOLN
500.0000 [IU] | Freq: Once | INTRAVENOUS | Status: AC | PRN
  Administered 2017-02-08: 500 [IU] via INTRAVENOUS
  Filled 2017-02-08: qty 5

## 2017-02-08 MED ORDER — ZOLPIDEM TARTRATE 10 MG PO TABS: 10.0000 mg | ORAL_TABLET | Freq: Every evening | ORAL | 0 refills | Status: DC | PRN

## 2017-02-08 MED ORDER — PREGABALIN 50 MG PO CAPS: 50.0000 mg | ORAL_CAPSULE | Freq: Two times a day (BID) | ORAL | 0 refills | Status: DC

## 2017-02-08 MED FILL — LYRICA 50 MG CAPSULE: 50 | 30 days supply | Qty: 60 | Fill #0

## 2017-02-08 MED FILL — ZOLPIDEM TARTRATE 10 MG TAB: 10 | 30 days supply | Qty: 30 | Fill #0

## 2017-02-08 MED FILL — oxyCODONE HCL 5 MG/5ML SOLN: 5 | 15 days supply | Qty: 473 | Fill #0

## 2017-02-08 NOTE — Patient Instructions (Signed)
Implanted Port Home Guide An implanted port is a type of central line that is placed under the skin. Central lines are used to provide IV access when treatment or nutrition needs to be given through a person's veins. Implanted ports are used for long-term IV access. An implanted port may be placed because:  You need IV medicine that would be irritating to the small veins in your hands or arms.  You need long-term IV medicines, such as antibiotics.  You need IV nutrition for a long period.  You need frequent blood draws for lab tests.  You need dialysis.  Implanted ports are usually placed in the chest area, but they can also be placed in the upper arm, the abdomen, or the leg. An implanted port has two main parts:  Reservoir. The reservoir is round and will appear as a small, raised area under your skin. The reservoir is the part where a needle is inserted to give medicines or draw blood.  Catheter. The catheter is a thin, flexible tube that extends from the reservoir. The catheter is placed into a large vein. Medicine that is inserted into the reservoir goes into the catheter and then into the vein.  How will I care for my incision site? Do not get the incision site wet. Bathe or shower as directed by your health care provider. How is my port accessed? Special steps must be taken to access the port:  Before the port is accessed, a numbing cream can be placed on the skin. This helps numb the skin over the port site.  Your health care provider uses a sterile technique to access the port. ? Your health care provider must put on a mask and sterile gloves. ? The skin over your port is cleaned carefully with an antiseptic and allowed to dry. ? The port is gently pinched between sterile gloves, and a needle is inserted into the port.  Only "non-coring" port needles should be used to access the port. Once the port is accessed, a blood return should be checked. This helps ensure that the port  is in the vein and is not clogged.  If your port needs to remain accessed for a constant infusion, a clear (transparent) bandage will be placed over the needle site. The bandage and needle will need to be changed every week, or as directed by your health care provider.  Keep the bandage covering the needle clean and dry. Do not get it wet. Follow your health care provider's instructions on how to take a shower or bath while the port is accessed.  If your port does not need to stay accessed, no bandage is needed over the port.  What is flushing? Flushing helps keep the port from getting clogged. Follow your health care provider's instructions on how and when to flush the port. Ports are usually flushed with saline solution or a medicine called heparin. The need for flushing will depend on how the port is used.  If the port is used for intermittent medicines or blood draws, the port will need to be flushed: ? After medicines have been given. ? After blood has been drawn. ? As part of routine maintenance.  If a constant infusion is running, the port may not need to be flushed.  How long will my port stay implanted? The port can stay in for as long as your health care provider thinks it is needed. When it is time for the port to come out, surgery will be   done to remove it. The procedure is similar to the one performed when the port was put in. When should I seek immediate medical care? When you have an implanted port, you should seek immediate medical care if:  You notice a bad smell coming from the incision site.  You have swelling, redness, or drainage at the incision site.  You have more swelling or pain at the port site or the surrounding area.  You have a fever that is not controlled with medicine.  This information is not intended to replace advice given to you by your health care provider. Make sure you discuss any questions you have with your health care provider. Document  Released: 11/27/2005 Document Revised: 05/04/2016 Document Reviewed: 08/04/2013 Elsevier Interactive Patient Education  2017 [employer]  

## 2017-02-08 NOTE — Progress Notes (Signed)
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 He completed a cycle of ELF chemotherapy beginning 01/31/2017. He is completing palliative radiation to the left femur. He continues to have pain in the left lower thigh and mid back. He fell earlier today. He tolerated the chemotherapy well. Mild shoulder discomfort following Neulasta.   Objective:  Vital signs in last 24 hours:  Blood pressure 105/67, pulse (!) 103, temperature 98.3 F (36.8 C), temperature source Oral, resp. rate 17, height _0  (1.854 m), weight 165 lb 4.8 oz (75 kg), SpO2 98 %.    HEENT: No thrush or ulcers Resp: Round upper airway sounds bilaterally, no respiratory distress Cardio: Regular rate and rhythm GI: No hepatosplenomegaly, nontender Vascular: No leg edema    Portacath/PICC-without erythema  Lab Results:  Lab Results  Component Value Date   WBC 5.5 02/08/2017   HGB 9.3 (L) 02/08/2017   HCT 29.8 (L) 02/08/2017   MCV 83.9 02/08/2017   PLT 81 (L) 02/08/2017   NEUTROABS 3.9 02/08/2017     Medications: I have reviewed the patient's current medications.  Assessment/Plan: 1.Adenocarcinoma of the distal esophagus/gastric cardia-status post an endoscopic biopsy on 03/18/2013 confirming adenocarcinoma, HER-2/neunot amplified  -Staging CT scans 03/20/2013 confirmed a distal esophageal/gastric cardia mass, distal paraesophageal lymph nodes and paratracheal nodes concerning for metastatic lymphadenopathy  -A PET scan 03/27/2013 with a multifocal area of hypermetabolic activity involving the thoracic esophagus extending into the gastric cardia and hypermetabolic mediastinal nodes, no distant metastatic disease  -Initiation of radiation 04/07/2013 and weekly Taxol/carboplatin 04/08/2013, radiation completed 05/15/2013  -Restaging PET scan 06/09/2013-no evidence of metastatic disease, no hypermetabolic  lymphadenopathy  -Esophagogastrectomy 07/07/2013 revealed a ypT1b,ypN0 tumor with negative surgical margins  -CT neck 06/09/2014 revealed a necrotic lymph node in the right tracheoesophageal groove  -PET scan 11/15/2014 revealed a single focus of hypermetabolism corresponding to the necrotic right upper mediastinum lymph node -EUS biopsy of the paraesophageal lymph node 06/25/2012 confirmed metastatic adenocarcinoma  -Radiation completed 08/11/2014 -Rising CEA May 2016 -PET scan 07/13/2015 with hypermetabolic right paratracheal and right hilar adenopathy -Cycle 1 FOLFOX 08/03/2015 -Cycle 2 FOLFOX 08/17/2015 - cycle 3 FOLFOX 08/31/2015 (oxaliplatin dose reduced secondary to thrombocytopenia)  -Cycle 4 FOLFOX 09/14/2015 -Cycle 5 FOLFOX 09/28/2015 - restaging CTs 09/30/2015 with stable disease involving neck/chest lymph nodes, a slight increase in size of left upper lobe lung nodule  - cycle 6 FOLFOX 10/12/2015  -FOLFOX discontinued secondary to development of bilateral vocal cord paralysis November 2016 - CT scan of the neck and chest on 11/22/15 revealed no definite disease in the neck. At the thoracic inlet region, there is questionably slight increase in prominence of soft tissue at the right tracheoesophageal groove. This could be an enlarging node or mass but could also relate to the esophagectomy and pull-through surgery.Enlarging lingular and left lower lobe nodules are worrisome for metastatic disease. Peribronchovascular nodular consolidation in the anterior right lower lobe, slightly more prominent, indeterminate. Cholelithiasis. Left diaphragmatic hernia, stable. -Xeloda 1 week on/1 week off beginning 12/11/2015 -Restaging CTs of the neck and chest 02/07/2016 revealed enlargement of 2 left-sided lung nodules, no other evidence of disease progression -Cycle 1 Pembrolizumab 02/25/2016  -Cycle 2 Pembrolizumab 03/17/2016 -Cycle 3 Pembrolizumab 04/07/2016   -CT 04/20/2016  consistent with progressive disease involving a large pericardial effusion, bilateral pleural effusions, enlarged mediastinal lymph nodes and lung nodules  Cytology from the pericardial fluid 04/21/2016-positive for metastatic adenocarcinoma -CT 05/01/2016 was slight  worsening of right hilar lymphadenopathy with near complete collapse of the right middle lobe, nodular metastases in the left lung, stable bilateral pleural effusions  Cycle 1 Taxol/ramucirumab 05/12/2016; day 8 Taxol 05/19/2016; day 15 Taxol/ramucirumab 05/26/2016.  Cycle 2 day 1 Taxol/ramucirumab 06/22/2016  Cycle 2 day 8 Taxol 06/29/2016   Cycle 2 day 15 Taxol/ramucirumab07/28/2017  Cycle 3 day 1 Taxol/ramucirumab 07/20/2016  Cycle 3 day 8 Taxol 07/27/2016  Cycle 3 day 15 Taxol/ramucirumab08/24/2017  Restaging chest CT 08/10/2016-left upper lobe pulmonary nodule similar; left lower lobe pulmonary nodule may have enlarged minimally; improved appearance of the mediastinum and right infrahilar region with decreased soft tissue fullness; improvement in probable left infrahilar adenopathy; decreased left and resolved right pleural effusions; developing right adrenal nodularity; T7 vertebral body lesion suspicious for osseous metastasis.  Cycle 4 Taxol/ramucirumab09/06/2016   cycle 5 Taxol/ramucirumab10/03/2016  restaging chest CT 10/05/2016 with slight enlargement of mediastinal lymph nodes and pulmonary nodules, enlargement of left adrenal lesion,? Lymphatic tumor spread in the right lung  Cycle 6 Taxol/ ramucirumab11/01/2016; Taxol placed on hold beginning 10/26/2016 due to neuropathy symptoms, continuation of ramucirumab alone  Cycle 7Taxol/ ramucirumab11/30/2017-Taxol remains on hold  restaging chest CT 12/08/2016-minimal increase in size of a left hilar node and left lung nodule. Mild increase in the size of a right adrenal metastasis  Cycle 8 Taxol/ ramucirumab01/03/2017-Taxol resumed01/10/2017  CT  lumbar spine 12/28/2016 with infiltrative tumor at L3 with mild L3 pathologic fracture. S2 lytic lesion/metastasis. New 3.5 x 2.3 cm right adrenal mass.  Initiation of palliative radiation to the lumbar spine 01/01/2017; completed 01/12/2017  Bone scan 01/25/2017-multiple areas of increased uptake including the cervical, thoracic, and lumbar spine. Left pubis, left femur.  Cycle 1 salvage chemotherapy with ELF 01/31/2017  Initiation of palliative radiation to the upper and lower left femur 02/05/2017  2. Indeterminate 15 mm right liver lesion on the CT scan 03/20/2013, MRI of the liver 06/09/2013 confirmed a right liver hemangioma  3. Anorexia/weight loss and solid dysphagia secondary to #1 -improved 4. History of a colon polyp, status post a polypectomy October 2013  5. history of Thrombocytopenia secondary chemotherapy-the carboplatin wase dose reduced with week 5 of chemotherapy chemotherapy  6. hoarseness secondary to right vocal cord paralysis-CT/PET scan imaging consistent with a malignant right paratracheal lymph node causing the vocal cord paralysis  -Status post a laryngeal plasty at [facility] on 09/02/2014 7. Esophageal stricture-status post dilation 06/08/2015 -Food impaction removal 09/29/2015 -Esophageal stricture dilation 10/06/2015 8. Port-A-Cath placement 07/20/2015 9. Delayed nausea following cycle 1 FOLFOX. Aloxi and Emend added with cycle 2. 10. Thrombocytopenia secondary to chemotherapy  11. Emotional lability/depression-prophylactic Decadron discontinued; Remeron initiated 12. Progressive hoarseness November 2016, diagnosed with bilateral vocal cord paralysis, status post a tracheostomy and bilateral injection laryngoplasty at [facility] on 10/27/2015 13. Pericardial window 04/21/2016  14. High fever 07/06/2016-chest x-ray suggestive of possible left lower lobe pneumonia-treated with Augmentin  15. 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  Taxol placed on hold beginning 10/26/2016.Lyrica added 11/23/2016 16. Depression-Remeron dose increased to 30 mg 11/23/2016 17. Back pain-potentially related to progressive esophagus cancer; CT 11/28/2016 with no bone lesion, bony distraction or fracture. Moderately severe lumbar facet arthritis greatest at L4-5 on the right. Good preservation of the intravertebral disc spaces. Stable, degenerative appearing, minimal anterolisthesis at L4-5.CT lumbar spine 12/28/2016-infiltrative tumor L3 with mild L3 pathologic fracture. S2 lytic lesion/metastasis. New 3.5 x 2.3 cm adrenal mass consistent with metastasis. Tumor mildly narrows the right L3-4 neural foramen. 18. Pain  at multiple sites including the mid thoracic region, right groin, left knee.   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 He continues to have pain at the left femur and upper back. He is completing palliative radiation to the left femur. He will discuss the indication for thoracic radiation with Dr. Lisbeth Renshaw. I am concerned about bone marrow toxicity with the concurrent chemotherapy and radiation.  He is now day 9 following cycle ELF. He has moderate thrombocytopenia. He will contact us for bleeding. We will check a CBC when he returns for radiation on 02/12/2017.  He will return for an office visit 02/22/2017.  We increased the dose of Ambien for insomnia. He understands to not take Ambien and Xanax at the same time.  25 minutes were spent with the patient today. The majority of the time was used for counseling and coordination of care.  02/08/2017  9:45 AM

## 2017-02-09 ENCOUNTER — Ambulatory Visit: Payer: BLUE CROSS/BLUE SHIELD

## 2017-02-09 ENCOUNTER — Ambulatory Visit
Admission: RE | Admit: 2017-02-09 | Discharge: 2017-02-09 | Disposition: A | Discharge status: Home / Self Care | Payer: BLUE CROSS/BLUE SHIELD | Source: Ambulatory Visit | Attending: Radiation Oncology | Admitting: Radiation Oncology

## 2017-02-09 ENCOUNTER — Other Ambulatory Visit: Payer: Self-pay | Admitting: *Deleted

## 2017-02-09 VITALS — BP 126/83 | HR 100 | Temp 98.4°F | Resp 18 | Wt 168.2 lb

## 2017-02-09 DIAGNOSIS — C7951 Secondary malignant neoplasm of bone: Secondary | ICD-10-CM

## 2017-02-09 DIAGNOSIS — C155 Malignant neoplasm of lower third of esophagus: Secondary | ICD-10-CM

## 2017-02-09 DIAGNOSIS — Z510 Encounter for antineoplastic radiation therapy: Secondary | ICD-10-CM | POA: Diagnosis not present

## 2017-02-09 MED ORDER — HYDROMORPHONE HCL 1 MG/ML PO LIQD: ORAL | 0 refills | Status: DC

## 2017-02-09 MED FILL — HYDROMORPHONE 5 MG/5 ML SOL: 1 | 15 days supply | Qty: 60 | Fill #0

## 2017-02-09 NOTE — Progress Notes (Addendum)
Weight stable. Heart rate slightly elevated. Reports overall pain in left upper leg was improving until he tripped last night. Reports left knee  pain 4, calf pain 6, and T7 pain 6 but, 10 when he lays on the treatment table. Reports taking Dilaudid at 3 pm today. Reports increased tingling in right hand. Reports numbness and tingling of lower extremities is no worse. Reports constipation and three days without a bowel movement. Reports using Miralax without success. Reports fatigue.   BP 126/83 (BP Location: Left Arm, Patient Position: Sitting, Cuff Size: Normal)   Pulse 100   Temp 98.4 F (36.9 C) (Oral)   Resp 18   Wt 168 lb 3.2 oz (76.3 kg)   SpO2 100%   BMI 22.19 kg/m  Wt Readings from Last 3 Encounters:  02/09/17 168 lb 3.2 oz (76.3 kg)  02/08/17 165 lb 4.8 oz (75 kg)  01/31/17 168 lb 12.8 oz (76.6 kg)

## 2017-02-11 NOTE — Progress Notes (Signed)
Department of Radiation Oncology  Phone:  [phone] Fax:        [phone]  Weekly Treatment Note    Name: ARMARI FUSSELL, Dr. Date: 02/11/2017 MRN: [id number] DOB: 1966/04/15   Diagnosis:     ICD-9-CM ICD-10-CM   1. Bone metastasis (HCC) 198.5 C79.51      Current dose: 17.5 Gy  Current fraction: 5   MEDICATIONS: Current Outpatient Prescriptions  Medication Sig Dispense Refill  . acetaminophen (TYLENOL) 500 MG tablet Take 1,000 mg by mouth every 6 (six) hours as needed for mild pain or moderate pain.    Marland Kitchen ALPRAZolam (XANAX) 0.5 MG tablet TAKE 1 TABLET BY MOUTH 3 TIMES DAILY AS NEEDED FOR ANXIETY 60 tablet 0  . Ascorbic Acid (VITAMIN C) 1000 MG tablet Take 1,000 mg by mouth 3 (three) times daily.    . cholecalciferol (VITAMIN D) 1000 units tablet Take 1,000 Units by mouth daily.    . cyclobenzaprine (FLEXERIL) 10 MG tablet Take 1 tablet (10 mg total) by mouth 3 (three) times daily as needed for muscle spasms. 90 tablet 0  . emollient (BIAFINE) cream Apply 1 application topically daily.    Marland Kitchen HYDROmorphone HCl (DILAUDID) 1 MG/ML LIQD Take 4-6 mls (4-6 mg total) every 6 hrs as needed. Do not take with oxycodone 60 mL 0  . ibuprofen (ADVIL,MOTRIN) 200 MG tablet Take 400 mg by mouth every 8 (eight) hours as needed for moderate pain. Reported on 05/12/2016    . lactase (LACTAID) 3000 UNITS tablet Take 3,000 Units by mouth daily as needed (lactose intolerance). Reported on 05/12/2016    . lidocaine-prilocaine (EMLA) cream Apply 1 application topically as needed. Apply tablespoon to PAC 2 hours prior to stick and cover with plastic wrap 30 g 5  . mirtazapine (REMERON) 30 MG tablet TAKE 1 TABLET (30 MG TOTAL) BY MOUTH AT BEDTIME. 30 tablet 0  . oxyCODONE (ROXICODONE) 5 MG/5ML solution Take 5 mLs (5 mg total) by mouth every 4 (four) hours as needed for severe pain. 473 mL 0  . polyethylene glycol (MIRALAX / GLYCOLAX) packet Take 17 g by mouth daily as needed.    . pregabalin  (LYRICA) 50 MG capsule Take 1 capsule (50 mg total) by mouth 2 (two) times daily. 60 capsule 0  . sennosides-docusate sodium (SENOKOT-S) 8.6-50 MG tablet Take 1 tablet by mouth daily.    Marland Kitchen zolpidem (AMBIEN) 10 MG tablet Take 1 tablet (10 mg total) by mouth at bedtime as needed for sleep. 30 tablet 0   No current facility-administered medications for this encounter.      ALLERGIES: Lactose intolerance (gi)   LABORATORY DATA:  Lab Results  Component Value Date   WBC 5.5 02/08/2017   HGB 9.3 (L) 02/08/2017   HCT 29.8 (L) 02/08/2017   MCV 83.9 02/08/2017   PLT 81 (L) 02/08/2017   Lab Results  Component Value Date   NA 140 01/31/2017   K 4.2 01/31/2017   CL 101 12/08/2016   CO2 25 01/31/2017   Lab Results  Component Value Date   ALT 7 01/31/2017   AST 11 01/31/2017   ALKPHOS 81 01/31/2017   BILITOT 0.47 01/31/2017     NARRATIVE: Manon Hilding Plocher, Dr. was seen today for weekly treatment management. The chart was checked and the patient's films were reviewed.  The patient states he is done satisfactorily with treatment this week. Does not appear to be feeling well overall however. No nausea, diarrhea, or other acute toxicity associated with  his treatment.  PHYSICAL EXAMINATION: weight is 168 lb 3.2 oz (76.3 kg). His oral temperature is 98.4 F (36.9 C). His blood pressure is 126/83 and his pulse is 100. His respiration is 18 and oxygen saturation is 100%.        ASSESSMENT: The patient is doing satisfactorily with treatment.  PLAN: We will continue with the patient's radiation treatment as planned.   The patient is scheduled to finish his treatment next Wednesday. I will try to see the patient back on the machine to check in with him on his final day.  If he needs to come around to clinic for a more extensive discussion then we can certainly do that.

## 2017-02-11 NOTE — Progress Notes (Signed)
  Radiation Oncology         [phone]) [phone] ________________________________  Name: JERIMAH WITUCKI, Dr. MRN: [id number]  Date: 02/01/2017  DOB: 1966/10/03  SIMULATION AND TREATMENT PLANNING NOTE  DIAGNOSIS:     ICD-9-CM ICD-10-CM   1. Bone metastasis (HCC) 198.5 C79.51      Site:   1.  Proximal left femur 2.  Distal left femur  NARRATIVE:  The patient was brought to the [facility].  Identity was confirmed.  All relevant records and images related to the planned course of therapy were reviewed.   Written consent to proceed with treatment was confirmed which was freely given after reviewing the details related to the planned course of therapy had been reviewed with the patient.  Then, the patient was set-up in a stable reproducible  supine position for radiation therapy.  CT images were obtained.  Surface markings were placed.    Medically necessary complex treatment device(s) for immobilization:   Customized vac lock bag .   The CT images were loaded into the planning software.  Then the target and avoidance structures were contoured.  Treatment planning then occurred.  The radiation prescription was entered and confirmed.  A total of 4 complex treatment devices were fabricated which relate to the designed radiation treatment fields:  2 sets of AP PA fields for each of the 2 separate target sites identified above. Each of these customized fields/ complex treatment devices will be used on a daily basis during the radiation course. I have requested : Isodose Plan.   PLAN:  The patient will receive 28  Gy in 8 fractions.  ________________________________   [name], MD, PhD

## 2017-02-12 ENCOUNTER — Ambulatory Visit
Admission: RE | Admit: 2017-02-12 | Discharge: 2017-02-12 | Disposition: A | Discharge status: Home / Self Care | Payer: BLUE CROSS/BLUE SHIELD | Source: Ambulatory Visit | Attending: Radiation Oncology | Admitting: Radiation Oncology

## 2017-02-12 ENCOUNTER — Ambulatory Visit: Payer: BLUE CROSS/BLUE SHIELD

## 2017-02-12 DIAGNOSIS — Z510 Encounter for antineoplastic radiation therapy: Secondary | ICD-10-CM | POA: Diagnosis not present

## 2017-02-13 ENCOUNTER — Other Ambulatory Visit: Payer: Self-pay | Admitting: Oncology

## 2017-02-13 ENCOUNTER — Ambulatory Visit: Payer: BLUE CROSS/BLUE SHIELD

## 2017-02-13 ENCOUNTER — Ambulatory Visit
Admission: RE | Admit: 2017-02-13 | Discharge: 2017-02-13 | Disposition: A | Discharge status: Home / Self Care | Payer: BLUE CROSS/BLUE SHIELD | Source: Ambulatory Visit | Attending: Radiation Oncology | Admitting: Radiation Oncology

## 2017-02-13 DIAGNOSIS — Z510 Encounter for antineoplastic radiation therapy: Secondary | ICD-10-CM | POA: Diagnosis not present

## 2017-02-14 ENCOUNTER — Ambulatory Visit
Admission: RE | Admit: 2017-02-14 | Discharge: 2017-02-14 | Disposition: A | Discharge status: Home / Self Care | Payer: BLUE CROSS/BLUE SHIELD | Source: Ambulatory Visit | Attending: Radiation Oncology | Admitting: Radiation Oncology

## 2017-02-14 ENCOUNTER — Ambulatory Visit: Payer: BLUE CROSS/BLUE SHIELD

## 2017-02-14 ENCOUNTER — Encounter: Payer: Self-pay | Admitting: Radiation Oncology

## 2017-02-14 DIAGNOSIS — Z510 Encounter for antineoplastic radiation therapy: Secondary | ICD-10-CM | POA: Diagnosis not present

## 2017-02-14 DIAGNOSIS — C7951 Secondary malignant neoplasm of bone: Secondary | ICD-10-CM

## 2017-02-14 MED FILL — MIRTAZAPINE 30 MG TABLET: 30 | 30 days supply | Qty: 30 | Fill #0

## 2017-02-14 NOTE — Progress Notes (Signed)
Department of Radiation Oncology  Phone:  [phone] Fax:        [phone]  Weekly Treatment Note    Name: Joel Osborne, Dr. Date: 02/14/2017 MRN: [id number] DOB: Sep 11, 1966   Diagnosis:     ICD-9-CM ICD-10-CM   1. Bone metastasis (HCC) 198.5 C79.51      Current dose: 28 Gy  Current fraction: 8   MEDICATIONS: Current Outpatient Prescriptions  Medication Sig Dispense Refill  . acetaminophen (TYLENOL) 500 MG tablet Take 1,000 mg by mouth every 6 (six) hours as needed for mild pain or moderate pain.    Marland Kitchen ALPRAZolam (XANAX) 0.5 MG tablet TAKE 1 TABLET BY MOUTH 3 TIMES DAILY AS NEEDED FOR ANXIETY 60 tablet 0  . Ascorbic Acid (VITAMIN C) 1000 MG tablet Take 1,000 mg by mouth 3 (three) times daily.    . cholecalciferol (VITAMIN D) 1000 units tablet Take 1,000 Units by mouth daily.    . cyclobenzaprine (FLEXERIL) 10 MG tablet Take 1 tablet (10 mg total) by mouth 3 (three) times daily as needed for muscle spasms. 90 tablet 0  . emollient (BIAFINE) cream Apply 1 application topically daily.    Marland Kitchen HYDROmorphone HCl (DILAUDID) 1 MG/ML LIQD Take 4-6 mls (4-6 mg total) every 6 hrs as needed. Do not take with oxycodone 60 mL 0  . ibuprofen (ADVIL,MOTRIN) 200 MG tablet Take 400 mg by mouth every 8 (eight) hours as needed for moderate pain. Reported on 05/12/2016    . lactase (LACTAID) 3000 UNITS tablet Take 3,000 Units by mouth daily as needed (lactose intolerance). Reported on 05/12/2016    . lidocaine-prilocaine (EMLA) cream Apply 1 application topically as needed. Apply tablespoon to PAC 2 hours prior to stick and cover with plastic wrap 30 g 5  . mirtazapine (REMERON) 30 MG tablet TAKE 1 TABLET BY MOUTH AT BEDTIME. 30 tablet 0  . oxyCODONE (ROXICODONE) 5 MG/5ML solution Take 5 mLs (5 mg total) by mouth every 4 (four) hours as needed for severe pain. 473 mL 0  . polyethylene glycol (MIRALAX / GLYCOLAX) packet Take 17 g by mouth daily as needed.    . pregabalin (LYRICA) 50 MG capsule  Take 1 capsule (50 mg total) by mouth 2 (two) times daily. 60 capsule 0  . sennosides-docusate sodium (SENOKOT-S) 8.6-50 MG tablet Take 1 tablet by mouth daily.    Marland Kitchen zolpidem (AMBIEN) 10 MG tablet Take 1 tablet (10 mg total) by mouth at bedtime as needed for sleep. 30 tablet 0   No current facility-administered medications for this encounter.      ALLERGIES: Lactose intolerance (gi)   LABORATORY DATA:  Lab Results  Component Value Date   WBC 5.5 02/08/2017   HGB 9.3 (L) 02/08/2017   HCT 29.8 (L) 02/08/2017   MCV 83.9 02/08/2017   PLT 81 (L) 02/08/2017   Lab Results  Component Value Date   NA 140 01/31/2017   K 4.2 01/31/2017   CL 101 12/08/2016   CO2 25 01/31/2017   Lab Results  Component Value Date   ALT 7 01/31/2017   AST 11 01/31/2017   ALKPHOS 81 01/31/2017   BILITOT 0.47 01/31/2017     NARRATIVE: Joel Osborne, Dr. was seen today for weekly treatment management. The chart was checked and the patient's films were reviewed.  The patient states that his pain in the left knee has improved a little. No significant difficulties with radiation treatment. No worsening nausea/diarrhea.  PHYSICAL EXAMINATION: vitals were not taken for this  visit.     Alert, no acute distress.  ASSESSMENT: The patient is doing satisfactorily with treatment.   He finish his final fraction today.  PLAN: The patient will return to clinic in 1 month.

## 2017-02-15 ENCOUNTER — Ambulatory Visit: Payer: BLUE CROSS/BLUE SHIELD

## 2017-02-16 ENCOUNTER — Other Ambulatory Visit (HOSPITAL_BASED_OUTPATIENT_CLINIC_OR_DEPARTMENT_OTHER): Payer: BLUE CROSS/BLUE SHIELD

## 2017-02-16 ENCOUNTER — Ambulatory Visit: Payer: BLUE CROSS/BLUE SHIELD

## 2017-02-16 ENCOUNTER — Telehealth: Payer: Self-pay | Admitting: Oncology

## 2017-02-16 ENCOUNTER — Other Ambulatory Visit: Payer: Self-pay | Admitting: *Deleted

## 2017-02-16 DIAGNOSIS — C153 Malignant neoplasm of upper third of esophagus: Secondary | ICD-10-CM

## 2017-02-16 DIAGNOSIS — C155 Malignant neoplasm of lower third of esophagus: Secondary | ICD-10-CM

## 2017-02-16 LAB — CBC WITH DIFFERENTIAL/PLATELET
BASO%: 0.3 % (ref 0.0–2.0)
Basophils Absolute: 0 10*3/uL (ref 0.0–0.1)
EOS ABS: 0.2 10*3/uL (ref 0.0–0.5)
EOS%: 1.5 % (ref 0.0–7.0)
HEMATOCRIT: 32.5 % — AB (ref 38.4–49.9)
HGB: 10.3 g/dL — ABNORMAL LOW (ref 13.0–17.1)
LYMPH%: 5.8 % — ABNORMAL LOW (ref 14.0–49.0)
MCH: 26.8 pg — ABNORMAL LOW (ref 27.2–33.4)
MCHC: 31.7 g/dL — ABNORMAL LOW (ref 32.0–36.0)
MCV: 84.6 fL (ref 79.3–98.0)
MONO#: 0.9 10*3/uL (ref 0.1–0.9)
MONO%: 6.4 % (ref 0.0–14.0)
NEUT%: 86 % — ABNORMAL HIGH (ref 39.0–75.0)
NEUTROS ABS: 12 10*3/uL — AB (ref 1.5–6.5)
NRBC: 0 % (ref 0–0)
PLATELETS: 192 10*3/uL (ref 140–400)
RBC: 3.84 10*6/uL — ABNORMAL LOW (ref 4.20–5.82)
RDW: 17.6 % — AB (ref 11.0–14.6)
WBC: 14 10*3/uL — AB (ref 4.0–10.3)
lymph#: 0.8 10*3/uL — ABNORMAL LOW (ref 0.9–3.3)

## 2017-02-16 MED ORDER — HYDROMORPHONE HCL 1 MG/ML PO LIQD: ORAL | 0 refills | Status: DC

## 2017-02-16 NOTE — Telephone Encounter (Signed)
Spoke with patient and confirmed appointment for 3/15

## 2017-02-16 NOTE — Progress Notes (Signed)
Call received from patient requesting refill for Dilaudid on Monday.  Refill complete.

## 2017-02-19 ENCOUNTER — Ambulatory Visit: Payer: BLUE CROSS/BLUE SHIELD

## 2017-02-19 NOTE — Progress Notes (Addendum)
Joel Osborne. Spellman 51 yo. man with Bone metastasis one month FU.   Pain: Left buttock 4/10 and 2/10 left ham string taking Dilaudid,Oxycodone, MS contin Skin changes:None Numbness or weakness of extremities:Yes left leg weakness, using a cane to ambulate. Fatigue:Having fatigue due to a lack of sleep, taking Ambien Appetite:Eating fair trying to eat three meals per day.  Eats fruit for a snack. Weight:Complaint of nausea this morning. Wt Readings from Last 3 Encounters:  02/28/17 160 lb 3.2 oz (72.7 kg)  02/27/17 159 lb 8 oz (72.3 kg)  02/22/17 161 lb 1.6 oz (73.1 kg)  BP 124/89   Pulse (!) 125   Temp 98.9 F (37.2 C) (Oral)   Resp 18   Ht '6\' 1"'$  (1.854 m)   Wt 160 lb 3.2 oz (72.7 kg)   SpO2 99%   BMI 21.14 kg/m

## 2017-02-20 ENCOUNTER — Telehealth: Payer: Self-pay | Admitting: *Deleted

## 2017-02-20 NOTE — Telephone Encounter (Signed)
Call from pt: Questions about 3/15 appt. Returned call, pt does not want to be stuck peripherally for lab. He does agree to fingerstick for CBC only. Phlebotomy team made aware.

## 2017-02-21 NOTE — Progress Notes (Signed)
  Radiation Oncology         [phone]) [phone] ________________________________  Name: Joel Osborne, Dr. MRN: [id number]  Date: 02/14/2017  DOB: 01/10/1966  End of Treatment Note  Diagnosis:   51 year-old gentleman with progressive metastatic disease involving the right adrenal gland and lumbosacral spine. Recent bone scan reveals multifocal activity in the bones, most prominently in proximal and distal left femur.       ICD-9-CM ICD-10-CM   1. Bone metastasis ([place]) 198.5 C79.51     Indication for treatment:  Curative       Radiation treatment dates:   02/05/2017 to 02/14/2017  Site/dose:    1. The proximal left femur was treated to 28 Gy in 8 fractions at 3.5 Gy per fraction. 2. The distal left femur was treated to 28 Gy in 8 fractions at 3.5 Gy per fraction.   Beams/energy:    1. Isodose plan // 10X 2. Isodose plan // 10X, 6X  Narrative: The patient tolerated radiation treatment relatively well.  He states that pain in the left knee has improved a little. He reports no significant difficulties with radiation treatment.   Plan: The patient has completed radiation treatment. The patient will return to radiation oncology clinic for routine followup in one month. I advised them to call or return sooner if they have any questions or concerns related to their recovery or treatment.  ------------------------------------------------  [name], MD, PhD  This document serves as a record of services personally performed by Kyung Rudd, MD. It was created on his behalf by Arlyce Harman, a trained medical scribe. The creation of this record is based on the scribe's personal observations and the provider's statements to them. This document has been checked and approved by the attending provider.

## 2017-02-22 ENCOUNTER — Ambulatory Visit (HOSPITAL_BASED_OUTPATIENT_CLINIC_OR_DEPARTMENT_OTHER): Payer: BLUE CROSS/BLUE SHIELD | Admitting: Nurse Practitioner

## 2017-02-22 ENCOUNTER — Other Ambulatory Visit (HOSPITAL_BASED_OUTPATIENT_CLINIC_OR_DEPARTMENT_OTHER): Payer: BLUE CROSS/BLUE SHIELD

## 2017-02-22 VITALS — BP 112/71 | HR 104 | Temp 98.7°F | Resp 18 | Wt 161.1 lb

## 2017-02-22 DIAGNOSIS — C153 Malignant neoplasm of upper third of esophagus: Secondary | ICD-10-CM

## 2017-02-22 DIAGNOSIS — C7951 Secondary malignant neoplasm of bone: Secondary | ICD-10-CM | POA: Diagnosis not present

## 2017-02-22 DIAGNOSIS — G893 Neoplasm related pain (acute) (chronic): Secondary | ICD-10-CM

## 2017-02-22 DIAGNOSIS — F329 Major depressive disorder, single episode, unspecified: Secondary | ICD-10-CM

## 2017-02-22 DIAGNOSIS — C155 Malignant neoplasm of lower third of esophagus: Secondary | ICD-10-CM

## 2017-02-22 LAB — CBC WITH DIFFERENTIAL/PLATELET
BASO%: 0.4 % (ref 0.0–2.0)
Basophils Absolute: 0.1 10*3/uL (ref 0.0–0.1)
EOS%: 0.5 % (ref 0.0–7.0)
Eosinophils Absolute: 0.1 10*3/uL (ref 0.0–0.5)
HCT: 32.8 % — ABNORMAL LOW (ref 38.4–49.9)
HGB: 10.2 g/dL — ABNORMAL LOW (ref 13.0–17.1)
LYMPH%: 5.3 % — ABNORMAL LOW (ref 14.0–49.0)
MCH: 26.6 pg — ABNORMAL LOW (ref 27.2–33.4)
MCHC: 31.1 g/dL — ABNORMAL LOW (ref 32.0–36.0)
MCV: 85.6 fL (ref 79.3–98.0)
MONO#: 0.9 10*3/uL (ref 0.1–0.9)
MONO%: 7.1 % (ref 0.0–14.0)
NEUT#: 10.4 10*3/uL — ABNORMAL HIGH (ref 1.5–6.5)
NEUT%: 86.7 % — ABNORMAL HIGH (ref 39.0–75.0)
Platelets: 189 10*3/uL (ref 140–400)
RBC: 3.83 10*6/uL — ABNORMAL LOW (ref 4.20–5.82)
RDW: 18.1 % — ABNORMAL HIGH (ref 11.0–14.6)
WBC: 12 10*3/uL — ABNORMAL HIGH (ref 4.0–10.3)
lymph#: 0.6 10*3/uL — ABNORMAL LOW (ref 0.9–3.3)
nRBC: 0 % (ref 0–0)

## 2017-02-22 MED FILL — HYDROMORPHONE 5 MG/5 ML SOL: 1 | 5 days supply | Qty: 60 | Fill #0

## 2017-02-22 NOTE — Progress Notes (Addendum)
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 He completed the course of radiation to the upper and lower left femur on 02/14/2017. He reports left knee and mid back pain have improved. He is having pain at the posterior upper legs bilaterally. This is not a new pain. He takes oxycodone and Dilaudid. He denies any nausea. Bowels are moving. No mouth sores.  He was noted to be sleepy/lethargic during today's visit. He reports he did not sleep well last night. He takes pain medicine prior to coming to the office today.  Objective:  Vital signs in last 24 hours:  Blood pressure 112/71, pulse (!) 104, temperature 98.7 F (37.1 C), temperature source Oral, resp. rate 18, weight 161 lb 1.6 oz (73.1 kg), SpO2 100 %.    HEENT: No thrush or ulcers. Resp: Lungs clear bilaterally. Cardio: Regular rate and rhythm. GI: Fullness with associated tenderness right upper abdomen. No definite hepatomegaly. Vascular: No leg edema. Neuro: Intermittently lethargic. Awakens easily with mild stimulation.  Port-A-Cath without erythema.  Lab Results:  Lab Results  Component Value Date   WBC 12.0 (H) 02/22/2017   HGB 10.2 (L) 02/22/2017   HCT 32.8 (L) 02/22/2017   MCV 85.6 02/22/2017   PLT 189 02/22/2017   NEUTROABS 10.4 (H) 02/22/2017    Imaging:  No results found.  Medications: I have reviewed the patient's current medications.  Assessment/Plan: 1.Adenocarcinoma of the distal esophagus/gastric cardia-status post an endoscopic biopsy on 03/18/2013 confirming adenocarcinoma, HER-2/neunot amplified  -Staging CT scans 03/20/2013 confirmed a distal esophageal/gastric cardia mass, distal paraesophageal lymph nodes and paratracheal nodes concerning for metastatic lymphadenopathy  -A PET scan 03/27/2013 with a multifocal area of hypermetabolic activity involving the thoracic esophagus extending into the  gastric cardia and hypermetabolic mediastinal nodes, no distant metastatic disease  -Initiation of radiation 04/07/2013 and weekly Taxol/carboplatin 04/08/2013, radiation completed 05/15/2013  -Restaging PET scan 06/09/2013-no evidence of metastatic disease, no hypermetabolic lymphadenopathy  -Esophagogastrectomy 07/07/2013 revealed a ypT1b,ypN0 tumor with negative surgical margins  -CT neck 06/09/2014 revealed a necrotic lymph node in the right tracheoesophageal groove  -PET scan 11/15/2014 revealed a single focus of hypermetabolism corresponding to the necrotic right upper mediastinum lymph node -EUS biopsy of the paraesophageal lymph node 06/25/2012 confirmed metastatic adenocarcinoma  -Radiation completed 08/11/2014 -Rising CEA May 2016 -PET scan 07/13/2015 with hypermetabolic right paratracheal and right hilar adenopathy -Cycle 1 FOLFOX 08/03/2015 -Cycle 2 FOLFOX 08/17/2015 - cycle 3 FOLFOX 08/31/2015 (oxaliplatin dose reduced secondary to thrombocytopenia)  -Cycle 4 FOLFOX 09/14/2015 -Cycle 5 FOLFOX 09/28/2015 - restaging CTs 09/30/2015 with stable disease involving neck/chest lymph nodes, a slight increase in size of left upper lobe lung nodule  - cycle 6 FOLFOX 10/12/2015  -FOLFOX discontinued secondary to development of bilateral vocal cord paralysis November 2016 - CT scan of the neck and chest on 11/22/15 revealed no definite disease in the neck. At the thoracic inlet region, there is questionably slight increase in prominence of soft tissue at the right tracheoesophageal groove. This could be an enlarging node or mass but could also relate to the esophagectomy and pull-through surgery.Enlarging lingular and left lower lobe nodules are worrisome for metastatic disease. Peribronchovascular nodular consolidation in the anterior right lower lobe, slightly more prominent, indeterminate. Cholelithiasis. Left diaphragmatic hernia, stable. -Xeloda 1 week on/1 week off beginning  12/11/2015 -Restaging CTs of the neck and chest 02/07/2016 revealed enlargement of 2 left-sided lung nodules, no other evidence of  disease progression -Cycle 1 Pembrolizumab 02/25/2016  -Cycle 2 Pembrolizumab 03/17/2016 -Cycle 3 Pembrolizumab 04/07/2016   -CT 04/20/2016 consistent with progressive disease involving a large pericardial effusion, bilateral pleural effusions, enlarged mediastinal lymph nodes and lung nodules  Cytology from the pericardial fluid 04/21/2016-positive for metastatic adenocarcinoma -CT 05/01/2016 was slight worsening of right hilar lymphadenopathy with near complete collapse of the right middle lobe, nodular metastases in the left lung, stable bilateral pleural effusions  Cycle 1 Taxol/ramucirumab 05/12/2016; day 8 Taxol 05/19/2016; day 15 Taxol/ramucirumab 05/26/2016.  Cycle 2 day 1 Taxol/ramucirumab 06/22/2016  Cycle 2 day 8 Taxol 06/29/2016   Cycle 2 day 15 Taxol/ramucirumab07/28/2017  Cycle 3 day 1 Taxol/ramucirumab 07/20/2016  Cycle 3 day 8 Taxol 07/27/2016  Cycle 3 day 15 Taxol/ramucirumab08/24/2017  Restaging chest CT 08/10/2016-left upper lobe pulmonary nodule similar; left lower lobe pulmonary nodule may have enlarged minimally; improved appearance of the mediastinum and right infrahilar region with decreased soft tissue fullness; improvement in probable left infrahilar adenopathy; decreased left and resolved right pleural effusions; developing right adrenal nodularity; T7 vertebral body lesion suspicious for osseous metastasis.  Cycle 4 Taxol/ramucirumab09/06/2016   cycle 5 Taxol/ramucirumab10/03/2016  restaging chest CT 10/05/2016 with slight enlargement of mediastinal lymph nodes and pulmonary nodules, enlargement of left adrenal lesion,? Lymphatic tumor spread in the right lung  Cycle 6 Taxol/ ramucirumab11/01/2016; Taxol placed on hold beginning 10/26/2016 due to neuropathy symptoms, continuation of ramucirumab alone  Cycle  7Taxol/ ramucirumab11/30/2017-Taxol remains on hold  restaging chest CT 12/08/2016-minimal increase in size of a left hilar node and left lung nodule. Mild increase in the size of a right adrenal metastasis  Cycle 8 Taxol/ ramucirumab01/03/2017-Taxol resumed01/10/2017  CT lumbar spine 12/28/2016 with infiltrative tumor at L3 with mild L3 pathologic fracture. S2 lytic lesion/metastasis. New 3.5 x 2.3 cm right adrenal mass.  Initiation of palliative radiation to the lumbar spine 01/01/2017; completed 01/12/2017  Bone scan 01/25/2017-multiple areas of increased uptake including the cervical, thoracic, and lumbar spine. Left pubis, left femur.  Cycle 1 salvage chemotherapy with ELF 01/31/2017  Initiation of palliative radiation to the upper and lower left femur 02/05/2017  2. Indeterminate 15 mm right liver lesion on the CT scan 03/20/2013, MRI of the liver 06/09/2013 confirmed a right liver hemangioma  3. Anorexia/weight loss and solid dysphagia secondary to #1 -improved 4. History of a colon polyp, status post a polypectomy October 2013  5. history of Thrombocytopenia secondary chemotherapy-the carboplatin wase dose reduced with week 5 of chemotherapy chemotherapy  6. hoarseness secondary to right vocal cord paralysis-CT/PET scan imaging consistent with a malignant right paratracheal lymph node causing the vocal cord paralysis  -Status post a laryngeal plasty at [facility] on 09/02/2014 7. Esophageal stricture-status post dilation 06/08/2015 -Food impaction removal 09/29/2015 -Esophageal stricture dilation 10/06/2015 8. Port-A-Cath placement 07/20/2015 9. Delayed nausea following cycle 1 FOLFOX. Aloxi and Emend added with cycle 2. 10. Thrombocytopenia secondary to chemotherapy  11. Emotional lability/depression-prophylactic Decadron discontinued; Remeron initiated 12. Progressive hoarseness November 2016, diagnosed with bilateral vocal cord paralysis, status post a tracheostomy and  bilateral injection laryngoplasty at [facility] on 10/27/2015 13. Pericardial window 04/21/2016  14. High fever 07/06/2016-chest x-ray suggestive of possible left lower lobe pneumonia-treated with Augmentin  15. 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 Taxol placed on hold beginning 10/26/2016.Lyrica added 11/23/2016 16. Depression-Remeron dose increased to 30 mg 11/23/2016 17. Back pain-potentially related to progressive esophagus cancer; CT 11/28/2016 with no bone lesion, bony distraction or fracture. Moderately severe lumbar facet arthritis greatest  at L4-5 on the right. Good preservation of the intravertebral disc spaces. Stable, degenerative appearing, minimal anterolisthesis at L4-5.CT lumbar spine 12/28/2016-infiltrative tumor L3 with mild L3 pathologic fracture. S2 lytic lesion/metastasis. New 3.5 x 2.3 cm adrenal mass consistent with metastasis. Tumor mildly narrows the right L3-4 neural foramen. 18. Pain at multiple sites including the mid thoracic region, right groin, left knee.   Palliative radiation to the upper and lower left femur initiated 02/05/2017, completed 02/14/2017.   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 He has completed 1 cycle of ELF. He completed palliative radiation to the upper and lower left femur earlier this month.  He will return for cycle 2 ELF on 02/27/2017.  We are contacting his wife to provide transportation home today. We reinforced that he cannot drive while taking pain medication. He expressed his understanding.  Patient seen with Dr. Benay Spice.  Kirklin, [place]duffee ANP/GNP-BC   02/22/2017  10:09 AM This was a shared visit with Ned Card. Dr.Milian was interviewed and examined. The plan is to proceed with cycle 2 ELF next week.  Julieanne Manson, M.D.

## 2017-02-25 ENCOUNTER — Other Ambulatory Visit: Payer: Self-pay | Admitting: Oncology

## 2017-02-26 ENCOUNTER — Telehealth: Payer: Self-pay | Admitting: Oncology

## 2017-02-26 NOTE — Telephone Encounter (Signed)
Spoke with patient re next appointment for 3/20. Patient to get new schedule at visit.

## 2017-02-27 ENCOUNTER — Other Ambulatory Visit (HOSPITAL_BASED_OUTPATIENT_CLINIC_OR_DEPARTMENT_OTHER): Payer: BLUE CROSS/BLUE SHIELD

## 2017-02-27 ENCOUNTER — Ambulatory Visit (HOSPITAL_BASED_OUTPATIENT_CLINIC_OR_DEPARTMENT_OTHER): Payer: BLUE CROSS/BLUE SHIELD

## 2017-02-27 ENCOUNTER — Ambulatory Visit: Payer: BLUE CROSS/BLUE SHIELD

## 2017-02-27 ENCOUNTER — Ambulatory Visit (HOSPITAL_BASED_OUTPATIENT_CLINIC_OR_DEPARTMENT_OTHER): Payer: BLUE CROSS/BLUE SHIELD | Admitting: Nurse Practitioner

## 2017-02-27 DIAGNOSIS — Z5111 Encounter for antineoplastic chemotherapy: Secondary | ICD-10-CM | POA: Diagnosis not present

## 2017-02-27 DIAGNOSIS — C155 Malignant neoplasm of lower third of esophagus: Secondary | ICD-10-CM

## 2017-02-27 DIAGNOSIS — C7951 Secondary malignant neoplasm of bone: Secondary | ICD-10-CM

## 2017-02-27 DIAGNOSIS — C153 Malignant neoplasm of upper third of esophagus: Secondary | ICD-10-CM

## 2017-02-27 DIAGNOSIS — G893 Neoplasm related pain (acute) (chronic): Secondary | ICD-10-CM

## 2017-02-27 DIAGNOSIS — Z95828 Presence of other vascular implants and grafts: Secondary | ICD-10-CM

## 2017-02-27 LAB — COMPREHENSIVE METABOLIC PANEL
ALT: 7 U/L (ref 0–55)
AST: 9 U/L (ref 5–34)
Albumin: 3.1 g/dL — ABNORMAL LOW (ref 3.5–5.0)
Alkaline Phosphatase: 90 U/L (ref 40–150)
Anion Gap: 9 mEq/L (ref 3–11)
BUN: 11.5 mg/dL (ref 7.0–26.0)
CO2: 28 mEq/L (ref 22–29)
Calcium: 9.2 mg/dL (ref 8.4–10.4)
Chloride: 104 mEq/L (ref 98–109)
Creatinine: 0.6 mg/dL — ABNORMAL LOW (ref 0.7–1.3)
EGFR: >90 mL/min/{1.73_m2} (ref >90)
Glucose: 98 mg/dl (ref 70–140)
Potassium: 4 mEq/L (ref 3.5–5.1)
Sodium: 140 mEq/L (ref 136–145)
Total Bilirubin: 0.3 mg/dL (ref 0.20–1.20)
Total Protein: 7 g/dL (ref 6.4–8.3)

## 2017-02-27 LAB — CBC WITH DIFFERENTIAL/PLATELET
BASO%: 0.3 % (ref 0.0–2.0)
BASOS ABS: 0 10*3/uL (ref 0.0–0.1)
EOS%: 0.9 % (ref 0.0–7.0)
Eosinophils Absolute: 0.1 10*3/uL (ref 0.0–0.5)
HCT: 29.9 % — ABNORMAL LOW (ref 38.4–49.9)
HEMOGLOBIN: 9.4 g/dL — AB (ref 13.0–17.1)
LYMPH#: 0.6 10*3/uL — AB (ref 0.9–3.3)
LYMPH%: 4.9 % — AB (ref 14.0–49.0)
MCH: 26.7 pg — AB (ref 27.2–33.4)
MCHC: 31.4 g/dL — ABNORMAL LOW (ref 32.0–36.0)
MCV: 84.9 fL (ref 79.3–98.0)
MONO#: 0.8 10*3/uL (ref 0.1–0.9)
MONO%: 7 % (ref 0.0–14.0)
NEUT#: 10.2 10*3/uL — ABNORMAL HIGH (ref 1.5–6.5)
NEUT%: 86.9 % — AB (ref 39.0–75.0)
Platelets: 187 10*3/uL (ref 140–400)
RBC: 3.52 10*6/uL — AB (ref 4.20–5.82)
RDW: 17.7 % — ABNORMAL HIGH (ref 11.0–14.6)
WBC: 11.7 10*3/uL — ABNORMAL HIGH (ref 4.0–10.3)

## 2017-02-27 LAB — CEA (IN HOUSE-CHCC): CEA (CHCC-In House): 657.32 ng/mL — ABNORMAL HIGH (ref 0.00–5.00)

## 2017-02-27 MED ORDER — SODIUM CHLORIDE 0.9 % IV SOLN
Freq: Once | INTRAVENOUS | Status: AC
  Administered 2017-02-27: 12:00:00 via INTRAVENOUS

## 2017-02-27 MED ORDER — MORPHINE SULFATE (PF) 4 MG/ML IV SOLN
INTRAVENOUS | Status: AC
  Filled 2017-02-27: qty 1

## 2017-02-27 MED ORDER — MORPHINE SULFATE 4 MG/ML IJ SOLN
4.0000 mg | Freq: Once | INTRAMUSCULAR | Status: AC
  Administered 2017-02-27: 4 mg via INTRAVENOUS
  Filled 2017-02-27: qty 1

## 2017-02-27 MED ORDER — ONDANSETRON HCL 4 MG/2ML IJ SOLN
8.0000 mg | Freq: Once | INTRAMUSCULAR | Status: AC
  Administered 2017-02-27: 8 mg via INTRAVENOUS

## 2017-02-27 MED ORDER — FLUOROURACIL CHEMO INJECTION 2.5 GM/50ML
300.0000 mg/m2 | Freq: Once | INTRAVENOUS | Status: AC
  Administered 2017-02-27: 600 mg via INTRAVENOUS
  Filled 2017-02-27: qty 12

## 2017-02-27 MED ORDER — ONDANSETRON HCL 4 MG/2ML IJ SOLN
INTRAMUSCULAR | Status: AC
  Filled 2017-02-27: qty 4

## 2017-02-27 MED ORDER — HYDROMORPHONE HCL 1 MG/ML PO LIQD: ORAL | 0 refills | Status: DC

## 2017-02-27 MED ORDER — SODIUM CHLORIDE 0.9 % IV SOLN
80.0000 mg/m2 | Freq: Once | INTRAVENOUS | Status: AC
  Administered 2017-02-27: 160 mg via INTRAVENOUS
  Filled 2017-02-27: qty 8

## 2017-02-27 MED ORDER — LEUCOVORIN CALCIUM INJECTION 350 MG
300.0000 mg/m2 | Freq: Once | INTRAMUSCULAR | Status: AC
  Administered 2017-02-27: 588 mg via INTRAVENOUS
  Filled 2017-02-27: qty 29.4

## 2017-02-27 MED ORDER — OXYCODONE HCL 5 MG/5ML PO SOLN: 5.0000 mg | ORAL | 0 refills | Status: DC | PRN

## 2017-02-27 MED ORDER — SODIUM CHLORIDE 0.9 % IJ SOLN
10.0000 mL | INTRAMUSCULAR | Status: DC | PRN
  Administered 2017-02-27: 10 mL via INTRAVENOUS
  Filled 2017-02-27: qty 10

## 2017-02-27 MED ORDER — HEPARIN SOD (PORK) LOCK FLUSH 100 UNIT/ML IV SOLN
500.0000 [IU] | Freq: Once | INTRAVENOUS | Status: AC | PRN
  Administered 2017-02-27: 500 [IU]
  Filled 2017-02-27: qty 5

## 2017-02-27 MED ORDER — MORPHINE SULFATE ER 30 MG PO TBCR: 30.0000 mg | EXTENDED_RELEASE_TABLET | Freq: Two times a day (BID) | ORAL | 0 refills | Status: DC

## 2017-02-27 MED ORDER — SODIUM CHLORIDE 0.9% FLUSH
10.0000 mL | INTRAVENOUS | Status: DC | PRN
  Administered 2017-02-27: 10 mL
  Filled 2017-02-27: qty 10

## 2017-02-27 MED FILL — MORPHINE SULF ER 30 MG TAB: 30 | 30 days supply | Qty: 60 | Fill #0

## 2017-02-27 MED FILL — HYDROMORPHONE 5 MG/5 ML SOL: 1 | 2 days supply | Qty: 60 | Fill #0

## 2017-02-27 MED FILL — oxyCODONE HCL 5 MG/5ML SOLN: 5 | 15 days supply | Qty: 473 | Fill #0

## 2017-02-27 NOTE — Patient Instructions (Signed)
[facility] Discharge Instructions for Patients Receiving Chemotherapy  Today you received the following chemotherapy agents: Leucovorin Injection, Etoposide and Adrucil   To help prevent nausea and vomiting after your treatment, we encourage you to take your nausea medication as directed.    If you develop nausea and vomiting that is not controlled by your nausea medication, call the clinic.   BELOW ARE SYMPTOMS THAT SHOULD BE REPORTED IMMEDIATELY:  *FEVER GREATER THAN 100.5 F  *CHILLS WITH OR WITHOUT FEVER  NAUSEA AND VOMITING THAT IS NOT CONTROLLED WITH YOUR NAUSEA MEDICATION  *UNUSUAL SHORTNESS OF BREATH  *UNUSUAL BRUISING OR BLEEDING  TENDERNESS IN MOUTH AND THROAT WITH OR WITHOUT PRESENCE OF ULCERS  *URINARY PROBLEMS  *BOWEL PROBLEMS  UNUSUAL RASH Items with * indicate a potential emergency and should be followed up as soon as possible.  Feel free to call the clinic you have any questions or concerns. The clinic phone number is [phone].  Please show the [facility] at check-in to the Emergency Department and triage nurse.

## 2017-02-27 NOTE — Progress Notes (Signed)
Patient complaining of continual pain to his lower back and legs. Pain has ranged from a 6-9 throughout the day and patient has received a total of '12mg'$  of morphine today. Ned Card, NP and Dr. Benay Spice made aware of patient's continued pain. Ned Card, NP at bedside to assess patient. Per Ned Card, NP patient is to receive a prescription for a long acting pain medication. Patient aware and verbalized understanding.

## 2017-02-27 NOTE — Progress Notes (Addendum)
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 He feels nauseated after receiving IV morphine this morning. He did not have nausea prior to coming to the office. He reports bilateral hip and upper leg pain. He is also having right shoulder pain. The pain is better following the IV morphine. He has intermittent dysphagia. No mouth sores. No diarrhea.   Objective:  Vital signs in last 24 hours:  Blood pressure 115/72, pulse (!) 107, temperature 98.5 F (36.9 C), temperature source Oral, resp. rate 18, weight 159 lb 8 oz (72.3 kg), SpO2 98 %.    HEENT: No thrush or ulcers. Resp: Scattered expiratory rhonchi. No respiratory distress. Cardio: Regular rate and rhythm. GI: Abdomen soft and nontender. No hepatomegaly. Vascular: No leg edema. Musculoskeletal: Tender over the left ischium. Port-A-Cath without erythema.   Lab Results:  Lab Results  Component Value Date   WBC 11.7 (H) 02/27/2017   HGB 9.4 (L) 02/27/2017   HCT 29.9 (L) 02/27/2017   MCV 84.9 02/27/2017   PLT 187 02/27/2017   NEUTROABS 10.2 (H) 02/27/2017    Imaging:  No results found.  Medications: I have reviewed the patient's current medications.  Assessment/Plan: 1.Adenocarcinoma of the distal esophagus/gastric cardia-status post an endoscopic biopsy on 03/18/2013 confirming adenocarcinoma, HER-2/neunot amplified  -Staging CT scans 03/20/2013 confirmed a distal esophageal/gastric cardia mass, distal paraesophageal lymph nodes and paratracheal nodes concerning for metastatic lymphadenopathy  -A PET scan 03/27/2013 with a multifocal area of hypermetabolic activity involving the thoracic esophagus extending into the gastric cardia and hypermetabolic mediastinal nodes, no distant metastatic disease  -Initiation of radiation 04/07/2013 and weekly Taxol/carboplatin 04/08/2013, radiation completed 05/15/2013  -Restaging PET  scan 06/09/2013-no evidence of metastatic disease, no hypermetabolic lymphadenopathy  -Esophagogastrectomy 07/07/2013 revealed a ypT1b,ypN0 tumor with negative surgical margins  -CT neck 06/09/2014 revealed a necrotic lymph node in the right tracheoesophageal groove  -PET scan 11/15/2014 revealed a single focus of hypermetabolism corresponding to the necrotic right upper mediastinum lymph node -EUS biopsy of the paraesophageal lymph node 06/25/2012 confirmed metastatic adenocarcinoma  -Radiation completed 08/11/2014 -Rising CEA May 2016 -PET scan 07/13/2015 with hypermetabolic right paratracheal and right hilar adenopathy -Cycle 1 FOLFOX 08/03/2015 -Cycle 2 FOLFOX 08/17/2015 - cycle 3 FOLFOX 08/31/2015 (oxaliplatin dose reduced secondary to thrombocytopenia)  -Cycle 4 FOLFOX 09/14/2015 -Cycle 5 FOLFOX 09/28/2015 - restaging CTs 09/30/2015 with stable disease involving neck/chest lymph nodes, a slight increase in size of left upper lobe lung nodule  - cycle 6 FOLFOX 10/12/2015  -FOLFOX discontinued secondary to development of bilateral vocal cord paralysis November 2016 - CT scan of the neck and chest on 11/22/15 revealed no definite disease in the neck. At the thoracic inlet region, there is questionably slight increase in prominence of soft tissue at the right tracheoesophageal groove. This could be an enlarging node or mass but could also relate to the esophagectomy and pull-through surgery.Enlarging lingular and left lower lobe nodules are worrisome for metastatic disease. Peribronchovascular nodular consolidation in the anterior right lower lobe, slightly more prominent, indeterminate. Cholelithiasis. Left diaphragmatic hernia, stable. -Xeloda 1 week on/1 week off beginning 12/11/2015 -Restaging CTs of the neck and chest 02/07/2016 revealed enlargement of 2 left-sided lung nodules, no other evidence of disease progression -Cycle 1 Pembrolizumab 02/25/2016  -Cycle 2 Pembrolizumab  03/17/2016 -Cycle 3 Pembrolizumab 04/07/2016   -CT 04/20/2016 consistent with progressive disease involving a large pericardial effusion, bilateral pleural effusions, enlarged mediastinal lymph nodes and  lung nodules  Cytology from the pericardial fluid 04/21/2016-positive for metastatic adenocarcinoma -CT 05/01/2016 was slight worsening of right hilar lymphadenopathy with near complete collapse of the right middle lobe, nodular metastases in the left lung, stable bilateral pleural effusions  Cycle 1 Taxol/ramucirumab 05/12/2016; day 8 Taxol 05/19/2016; day 15 Taxol/ramucirumab 05/26/2016.  Cycle 2 day 1 Taxol/ramucirumab 06/22/2016  Cycle 2 day 8 Taxol 06/29/2016   Cycle 2 day 15 Taxol/ramucirumab07/28/2017  Cycle 3 day 1 Taxol/ramucirumab 07/20/2016  Cycle 3 day 8 Taxol 07/27/2016  Cycle 3 day 15 Taxol/ramucirumab08/24/2017  Restaging chest CT 08/10/2016-left upper lobe pulmonary nodule similar; left lower lobe pulmonary nodule may have enlarged minimally; improved appearance of the mediastinum and right infrahilar region with decreased soft tissue fullness; improvement in probable left infrahilar adenopathy; decreased left and resolved right pleural effusions; developing right adrenal nodularity; T7 vertebral body lesion suspicious for osseous metastasis.  Cycle 4 Taxol/ramucirumab09/06/2016   cycle 5 Taxol/ramucirumab10/03/2016  restaging chest CT 10/05/2016 with slight enlargement of mediastinal lymph nodes and pulmonary nodules, enlargement of left adrenal lesion,? Lymphatic tumor spread in the right lung  Cycle 6 Taxol/ ramucirumab11/01/2016; Taxol placed on hold beginning 10/26/2016 due to neuropathy symptoms, continuation of ramucirumab alone  Cycle 7Taxol/ ramucirumab11/30/2017-Taxol remains on hold  restaging chest CT 12/08/2016-minimal increase in size of a left hilar node and left lung nodule. Mild increase in the size of a right adrenal metastasis  Cycle  8 Taxol/ ramucirumab01/03/2017-Taxol resumed01/10/2017  CT lumbar spine 12/28/2016 with infiltrative tumor at L3 with mild L3 pathologic fracture. S2 lytic lesion/metastasis. New 3.5 x 2.3 cm right adrenal mass.  Initiation of palliative radiation to the lumbar spine 01/01/2017; completed 01/12/2017  Bone scan 01/25/2017-multiple areas of increased uptake including the cervical, thoracic, and lumbar spine. Left pubis, left femur.  Cycle 1 salvage chemotherapy with ELF 01/31/2017  Initiation of palliative radiation to the upper and lower left femur 02/05/2017  Cycle 2 ELF 02/27/2017  2. Indeterminate 15 mm right liver lesion on the CT scan 03/20/2013, MRI of the liver 06/09/2013 confirmed a right liver hemangioma  3. Anorexia/weight loss and solid dysphagia secondary to #1 -improved 4. History of a colon polyp, status post a polypectomy October 2013  5. history of Thrombocytopenia secondary chemotherapy-the carboplatin wase dose reduced with week 5 of chemotherapy chemotherapy  6. hoarseness secondary to right vocal cord paralysis-CT/PET scan imaging consistent with a malignant right paratracheal lymph node causing the vocal cord paralysis  -Status post a laryngeal plasty at [facility] on 09/02/2014 7. Esophageal stricture-status post dilation 06/08/2015 -Food impaction removal 09/29/2015 -Esophageal stricture dilation 10/06/2015 8. Port-A-Cath placement 07/20/2015 9. Delayed nausea following cycle 1 FOLFOX. Aloxi and Emend added with cycle 2. 10. Thrombocytopenia secondary to chemotherapy  11. Emotional lability/depression-prophylactic Decadron discontinued; Remeron initiated 12. Progressive hoarseness November 2016, diagnosed with bilateral vocal cord paralysis, status post a tracheostomy and bilateral injection laryngoplasty at [facility] on 10/27/2015 13. Pericardial window 04/21/2016  14. High fever 07/06/2016-chest x-ray suggestive of possible left lower lobe pneumonia-treated with  Augmentin  15. 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 Taxol placed on hold beginning 10/26/2016.Lyrica added 11/23/2016 16. Depression-Remeron dose increased to 30 mg 11/23/2016 17. Back pain-potentially related to progressive esophagus cancer; CT 11/28/2016 with no bone lesion, bony distraction or fracture. Moderately severe lumbar facet arthritis greatest at L4-5 on the right. Good preservation of the intravertebral disc spaces. Stable, degenerative appearing, minimal anterolisthesis at L4-5.CT lumbar spine 12/28/2016-infiltrative tumor L3 with mild L3 pathologic fracture. S2 lytic  lesion/metastasis. New 3.5 x 2.3 cm adrenal mass consistent with metastasis. Tumor mildly narrows the right L3-4 neural foramen. 18. Pain at multiple sites including the mid thoracic region, right groin, left knee.   Palliative radiation to the upper and lower left femur initiated 02/05/2017, completed 02/14/2017.    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 He continues to have pain at multiple sites. The plan is to proceed with cycle 2 ELF today as scheduled. He will return for a follow-up visit and CEA in 2 weeks.  He will continue oxycodone and Dilaudid as needed. He was provided with new prescriptions at today's visit.  He will contact the office prior to his next visit with any problems.  Patient seen with Dr. Benay Spice.    Navraj, Dreibelbis ANP/GNP-BC   02/27/2017  11:33 AM  This was a shared visit with Ned Card. 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 He began cycle 2 ELF today. He required multiple doses of IV morphine at the Cancer center today. He was started on MS Contin. We will evaluate his clinical status over the next few weeks and recommend Hospice care if there is no improvement.  Julieanne Manson, M.D.

## 2017-02-28 ENCOUNTER — Ambulatory Visit: Payer: Self-pay | Admitting: Radiation Oncology

## 2017-02-28 ENCOUNTER — Ambulatory Visit
Admission: RE | Admit: 2017-02-28 | Discharge: 2017-02-28 | Disposition: A | Discharge status: Home / Self Care | Payer: BLUE CROSS/BLUE SHIELD | Source: Ambulatory Visit | Attending: Radiation Oncology | Admitting: Radiation Oncology

## 2017-02-28 ENCOUNTER — Encounter: Payer: Self-pay | Admitting: Radiation Oncology

## 2017-02-28 ENCOUNTER — Ambulatory Visit (HOSPITAL_BASED_OUTPATIENT_CLINIC_OR_DEPARTMENT_OTHER): Payer: BLUE CROSS/BLUE SHIELD

## 2017-02-28 ENCOUNTER — Other Ambulatory Visit: Payer: Self-pay | Admitting: Nurse Practitioner

## 2017-02-28 VITALS — BP 124/89 | HR 125 | Temp 98.9°F | Resp 18 | Ht 73.0 in | Wt 160.2 lb

## 2017-02-28 VITALS — BP 129/84 | HR 115 | Temp 98.1°F | Resp 16

## 2017-02-28 DIAGNOSIS — C159 Malignant neoplasm of esophagus, unspecified: Secondary | ICD-10-CM | POA: Diagnosis present

## 2017-02-28 DIAGNOSIS — Z9221 Personal history of antineoplastic chemotherapy: Secondary | ICD-10-CM | POA: Diagnosis not present

## 2017-02-28 DIAGNOSIS — C155 Malignant neoplasm of lower third of esophagus: Secondary | ICD-10-CM

## 2017-02-28 DIAGNOSIS — M545 Low back pain: Secondary | ICD-10-CM | POA: Diagnosis not present

## 2017-02-28 DIAGNOSIS — Z91011 Allergy to milk products: Secondary | ICD-10-CM | POA: Diagnosis not present

## 2017-02-28 DIAGNOSIS — Z79899 Other long term (current) drug therapy: Secondary | ICD-10-CM | POA: Diagnosis not present

## 2017-02-28 DIAGNOSIS — Z79891 Long term (current) use of opiate analgesic: Secondary | ICD-10-CM | POA: Insufficient documentation

## 2017-02-28 DIAGNOSIS — Z923 Personal history of irradiation: Secondary | ICD-10-CM | POA: Diagnosis not present

## 2017-02-28 DIAGNOSIS — Z5111 Encounter for antineoplastic chemotherapy: Secondary | ICD-10-CM | POA: Diagnosis not present

## 2017-02-28 DIAGNOSIS — M25562 Pain in left knee: Secondary | ICD-10-CM | POA: Insufficient documentation

## 2017-02-28 DIAGNOSIS — C7951 Secondary malignant neoplasm of bone: Secondary | ICD-10-CM | POA: Insufficient documentation

## 2017-02-28 MED ORDER — DEXAMETHASONE SODIUM PHOSPHATE 10 MG/ML IJ SOLN
INTRAMUSCULAR | Status: AC
  Filled 2017-02-28: qty 1

## 2017-02-28 MED ORDER — ONDANSETRON HCL 4 MG/2ML IJ SOLN
8.0000 mg | Freq: Once | INTRAMUSCULAR | Status: AC
  Administered 2017-02-28: 8 mg via INTRAVENOUS

## 2017-02-28 MED ORDER — SODIUM CHLORIDE 0.9% FLUSH
10.0000 mL | INTRAVENOUS | Status: DC | PRN
  Administered 2017-02-28: 10 mL
  Filled 2017-02-28: qty 10

## 2017-02-28 MED ORDER — LEUCOVORIN CALCIUM INJECTION 350 MG
300.0000 mg/m2 | Freq: Once | INTRAMUSCULAR | Status: AC
  Administered 2017-02-28: 588 mg via INTRAVENOUS
  Filled 2017-02-28: qty 29.4

## 2017-02-28 MED ORDER — SODIUM CHLORIDE 0.9 % IV SOLN
80.0000 mg/m2 | Freq: Once | INTRAVENOUS | Status: AC
  Administered 2017-02-28: 160 mg via INTRAVENOUS
  Filled 2017-02-28: qty 8

## 2017-02-28 MED ORDER — ONDANSETRON HCL 4 MG/2ML IJ SOLN
INTRAMUSCULAR | Status: AC
  Filled 2017-02-28: qty 4

## 2017-02-28 MED ORDER — SODIUM CHLORIDE 0.9 % IV SOLN
Freq: Once | INTRAVENOUS | Status: AC
  Administered 2017-02-28: 11:00:00 via INTRAVENOUS

## 2017-02-28 MED ORDER — FLUOROURACIL CHEMO INJECTION 2.5 GM/50ML
300.0000 mg/m2 | Freq: Once | INTRAVENOUS | Status: AC
  Administered 2017-02-28: 600 mg via INTRAVENOUS
  Filled 2017-02-28: qty 12

## 2017-02-28 MED ORDER — DEXAMETHASONE SODIUM PHOSPHATE 10 MG/ML IJ SOLN
10.0000 mg | Freq: Once | INTRAMUSCULAR | Status: AC
  Administered 2017-02-28: 10 mg via INTRAVENOUS

## 2017-02-28 MED ORDER — SODIUM CHLORIDE 0.9 % IV SOLN: 10.0000 mg | Freq: Once | INTRAVENOUS | Status: DC

## 2017-02-28 MED ORDER — HEPARIN SOD (PORK) LOCK FLUSH 100 UNIT/ML IV SOLN
500.0000 [IU] | Freq: Once | INTRAVENOUS | Status: AC | PRN
  Administered 2017-02-28: 500 [IU]
  Filled 2017-02-28: qty 5

## 2017-02-28 MED ORDER — PROCHLORPERAZINE MALEATE 10 MG PO TABS: 10.0000 mg | ORAL_TABLET | Freq: Four times a day (QID) | ORAL | 0 refills | Status: AC | PRN

## 2017-02-28 MED ORDER — MORPHINE SULFATE (PF) 4 MG/ML IV SOLN
INTRAVENOUS | Status: AC
  Filled 2017-02-28: qty 1

## 2017-02-28 MED ORDER — MORPHINE SULFATE 4 MG/ML IJ SOLN
4.0000 mg | Freq: Once | INTRAMUSCULAR | Status: AC
  Administered 2017-02-28: 4 mg via INTRAVENOUS
  Filled 2017-02-28: qty 1

## 2017-02-28 MED FILL — PROCHLORPERAZINE 10 MG TAB: 10 | 7 days supply | Qty: 30 | Fill #0

## 2017-02-28 NOTE — Progress Notes (Signed)
Radiation Oncology         [phone]) [phone] ________________________________  Name: Joel Osborne, Dr. 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  Interval Since Last Radiation:  2 weeks   02/05/2017 to 02/14/2017:   1. The proximal left femur was treated to 28 Gy in 8 fractions at 3.5 Gy per fraction. 2. The distal left femur was treated to 28 Gy in 8 fractions at 3.5 Gy per fraction.   01/01/17-01/12/17 30 Gy in 10 fractions to the L2-4 bone mets  06/2014: SBRT to paraesophageal node in 10 fractions  05/15/13: completed neoadjuvant radiotherapy with chemosensitization to the esophagus  Narrative:  The patient returns today for routine follow-up. He tolerated radiotherapy to the left proximal and distal femur very well and reports significant improvement in the left knee pain. He is aware that he has additional disease in the T7 vertebral body and the sacrum centrally, in particular.  He has recently started systemic chemotherapy with ELF, started cycle 2 on 02/27/17. He continues with significant pain in the left buttocks and left hamstring.  He also reports pain in the right lower back. He is using oxycodone, Dilaudid and MS Contin as needed for pain. He reports that this does provide temporary relief but causes some nausea.                       On review of systems, the patient states that he has recovered well from the effects of radiotherapy. He is pleased with his improvement of the pain in the left knee. With pain in the left buttocks and into the left hamstring that this is relatively well controlled with pain medication. He continues to ambulate using a cane. He reports having pain in the right lower back which she associates with a pulled muscle. The pain does not radiate into the right buttocks or down the right leg. The pain does not radiate into  the abdomen or groin. He reports nausea associated with pain medications. He has significant fatigue and is not resting well at night. He is using Ambien which seems to be helping. He has very little appetite but is eating 3 meals a day to try and maintaining weight. He denies any fever, chills, chest pain, shortness of breath or dyspnea.  ALLERGIES:  is allergic to lactose intolerance (gi).  Meds: Current Outpatient Prescriptions  Medication Sig Dispense Refill  . acetaminophen (TYLENOL) 500 MG tablet Take 1,000 mg by mouth every 6 (six) hours as needed for mild pain or moderate pain.    Marland Kitchen ALPRAZolam (XANAX) 0.5 MG tablet TAKE 1 TABLET BY MOUTH 3 TIMES DAILY AS NEEDED FOR ANXIETY 60 tablet 0  . Ascorbic Acid (VITAMIN C) 1000 MG tablet Take 1,000 mg by mouth 3 (three) times daily.    . cholecalciferol (VITAMIN D) 1000 units tablet Take 1,000 Units by mouth daily.    . cyclobenzaprine (FLEXERIL) 10 MG tablet Take 1 tablet (10 mg total) by mouth 3 (three) times daily as needed for muscle spasms. 90 tablet 0  . HYDROmorphone HCl (DILAUDID) 1 MG/ML LIQD Take 4-6 mls (4-6 mg total) every 6 hrs as needed. Do not take with oxycodone 60 mL 0  . ibuprofen (ADVIL,MOTRIN) 200 MG tablet Take 400 mg by mouth every 8 (eight) hours as needed for moderate pain. Reported on  05/12/2016    . lactase (LACTAID) 3000 UNITS tablet Take 3,000 Units by mouth daily as needed (lactose intolerance). Reported on 05/12/2016    . lidocaine-prilocaine (EMLA) cream Apply 1 application topically as needed. Apply tablespoon to PAC 2 hours prior to stick and cover with plastic wrap 30 g 5  . mirtazapine (REMERON) 30 MG tablet TAKE 1 TABLET BY MOUTH AT BEDTIME. 30 tablet 0  . morphine (MS CONTIN) 30 MG 12 hr tablet Take 1 tablet (30 mg total) by mouth every 12 (twelve) hours. DO NOT DRIVE WHILE TAKING THIS MEDICATION. 60 tablet 0  . oxyCODONE (ROXICODONE) 5 MG/5ML solution Take 5 mLs (5 mg total) by mouth every 4 (four) hours as needed  for severe pain. 473 mL 0  . polyethylene glycol (MIRALAX / GLYCOLAX) packet Take 17 g by mouth daily as needed.    . pregabalin (LYRICA) 50 MG capsule Take 1 capsule (50 mg total) by mouth 2 (two) times daily. 60 capsule 0  . zolpidem (AMBIEN) 10 MG tablet Take 1 tablet (10 mg total) by mouth at bedtime as needed for sleep. 30 tablet 0  . sennosides-docusate sodium (SENOKOT-S) 8.6-50 MG tablet Take 1 tablet by mouth daily.     No current facility-administered medications for this encounter.     Physical Findings:  height is '6\' 1"'$  (1.854 m) and weight is 160 lb 3.2 oz (72.7 kg). His oral temperature is 98.9 F (37.2 C). His blood pressure is 124/89 and his pulse is 125 (abnormal). His respiration is 18 and oxygen saturation is 99%.  Pain Assessment Pain Score: 4  (Left buttock,2/10 left am string)/10 In general this is a well appearing caucasian male in no acute distress. He's alert and oriented x4 and appropriate throughout the examination. Cardiopulmonary assessment is negative for acute distress and he exhibits normal effort.   Lab Findings: Lab Results  Component Value Date   WBC 11.7 (H) 02/27/2017   HGB 9.4 (L) 02/27/2017   HCT 29.9 (L) 02/27/2017   MCV 84.9 02/27/2017   PLT 187 02/27/2017     Radiographic Findings: No results found.  Impression/Plan: 1. 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  He has recovered well from the effects of radiotherapy. He continues with pain in the left buttocks and left hamstring as well as in the right lower back. He is aware that he has additional disease in the T7 vertebral body and the sacrum centrally.  However, at this time, he prefers to wait and see how he does with systemic chemotherapy before making any decision to proceed with further radiation treatments. We will continue to follow him along through correspondence with medical oncology. The patient knows to call at any time if he has any questions or  concerns related to previous radiotherapy and/or if he does wish to proceed with additional radiation treatment.  Otherwise, we will see him as needed.     Carola Rhine, PAC

## 2017-02-28 NOTE — Addendum Note (Signed)
Encounter addended by: Malena Edman, RN on: 02/28/2017 12:30 PM<BR>    Actions taken: Charge Capture section accepted

## 2017-02-28 NOTE — Patient Instructions (Signed)
[facility] Discharge Instructions for Patients Receiving Chemotherapy  Today you received the following chemotherapy agents etoposide/leucovorin/florouracil.    To help prevent nausea and vomiting after your treatment, we encourage you to take your nausea medication as directed.    If you develop nausea and vomiting that is not controlled by your nausea medication, call the clinic.   BELOW ARE SYMPTOMS THAT SHOULD BE REPORTED IMMEDIATELY:  *FEVER GREATER THAN 100.5 F  *CHILLS WITH OR WITHOUT FEVER  NAUSEA AND VOMITING THAT IS NOT CONTROLLED WITH YOUR NAUSEA MEDICATION  *UNUSUAL SHORTNESS OF BREATH  *UNUSUAL BRUISING OR BLEEDING  TENDERNESS IN MOUTH AND THROAT WITH OR WITHOUT PRESENCE OF ULCERS  *URINARY PROBLEMS  *BOWEL PROBLEMS  UNUSUAL RASH Items with * indicate a potential emergency and should be followed up as soon as possible.  Feel free to call the clinic you have any questions or concerns. The clinic phone number is [phone].

## 2017-03-01 ENCOUNTER — Ambulatory Visit (HOSPITAL_BASED_OUTPATIENT_CLINIC_OR_DEPARTMENT_OTHER): Payer: BLUE CROSS/BLUE SHIELD

## 2017-03-01 VITALS — BP 124/79 | HR 110 | Temp 98.4°F | Resp 16

## 2017-03-01 DIAGNOSIS — C155 Malignant neoplasm of lower third of esophagus: Secondary | ICD-10-CM

## 2017-03-01 DIAGNOSIS — Z5111 Encounter for antineoplastic chemotherapy: Secondary | ICD-10-CM

## 2017-03-01 MED ORDER — PEGFILGRASTIM 6 MG/0.6ML ~~LOC~~ PSKT
6.0000 mg | PREFILLED_SYRINGE | Freq: Once | SUBCUTANEOUS | Status: AC
  Administered 2017-03-01: 6 mg via SUBCUTANEOUS
  Filled 2017-03-01: qty 0.6

## 2017-03-01 MED ORDER — FLUOROURACIL CHEMO INJECTION 2.5 GM/50ML
300.0000 mg/m2 | Freq: Once | INTRAVENOUS | Status: AC
  Administered 2017-03-01: 600 mg via INTRAVENOUS
  Filled 2017-03-01: qty 12

## 2017-03-01 MED ORDER — LEUCOVORIN CALCIUM INJECTION 350 MG
300.0000 mg/m2 | Freq: Once | INTRAMUSCULAR | Status: AC
  Administered 2017-03-01: 588 mg via INTRAVENOUS
  Filled 2017-03-01: qty 29.4

## 2017-03-01 MED ORDER — SODIUM CHLORIDE 0.9 % IV SOLN
Freq: Once | INTRAVENOUS | Status: AC
  Administered 2017-03-01: 11:00:00 via INTRAVENOUS

## 2017-03-01 MED ORDER — ACETAMINOPHEN 325 MG PO TABS
ORAL_TABLET | ORAL | Status: AC
  Filled 2017-03-01: qty 2

## 2017-03-01 MED ORDER — ACETAMINOPHEN 325 MG PO TABS
650.0000 mg | ORAL_TABLET | Freq: Once | ORAL | Status: AC
  Administered 2017-03-01: 650 mg via ORAL

## 2017-03-01 MED ORDER — SODIUM CHLORIDE 0.9% FLUSH
10.0000 mL | INTRAVENOUS | Status: DC | PRN
  Administered 2017-03-01: 10 mL
  Filled 2017-03-01: qty 10

## 2017-03-01 MED ORDER — SODIUM CHLORIDE 0.9 % IV SOLN
80.0000 mg/m2 | Freq: Once | INTRAVENOUS | Status: AC
  Administered 2017-03-01: 160 mg via INTRAVENOUS
  Filled 2017-03-01: qty 8

## 2017-03-01 MED ORDER — HEPARIN SOD (PORK) LOCK FLUSH 100 UNIT/ML IV SOLN
500.0000 [IU] | Freq: Once | INTRAVENOUS | Status: AC | PRN
  Administered 2017-03-01: 500 [IU]
  Filled 2017-03-01: qty 5

## 2017-03-01 MED ORDER — ONDANSETRON HCL 4 MG/2ML IJ SOLN
INTRAMUSCULAR | Status: AC
  Filled 2017-03-01: qty 4

## 2017-03-01 MED ORDER — ONDANSETRON HCL 4 MG/2ML IJ SOLN
8.0000 mg | Freq: Once | INTRAMUSCULAR | Status: AC
  Administered 2017-03-01: 8 mg via INTRAVENOUS

## 2017-03-01 NOTE — Progress Notes (Signed)
Pt here for chemo.  C/O pain in lower back  - rated  5/10 .   Dr. Benay Spice notified.  Order received for Morphine 4 mg IVP - only if pt has a drive home, and not driving by self.   Explanations given to pt.  Pt refused to have spouse called to pick him up at discharge.  Stated he just wanted Tylenol for pain.  Dr. Benay Spice notified.  OK to give Tylenol for pain per md. 1310  -  Stated pain relief somewhat -  2/10.  Stated he would talke pain meds when arrives home.

## 2017-03-01 NOTE — Patient Instructions (Signed)
[facility] Discharge Instructions for Patients Receiving Chemotherapy  Today you received the following chemotherapy agents :  Etoposide, Leucovorin, Fluorouracil.  To help prevent nausea and vomiting after your treatment, we encourage you to take your nausea medication as prescribed.   If you develop nausea and vomiting that is not controlled by your nausea medication, call the clinic.   BELOW ARE SYMPTOMS THAT SHOULD BE REPORTED IMMEDIATELY:  *FEVER GREATER THAN 100.5 F  *CHILLS WITH OR WITHOUT FEVER  NAUSEA AND VOMITING THAT IS NOT CONTROLLED WITH YOUR NAUSEA MEDICATION  *UNUSUAL SHORTNESS OF BREATH  *UNUSUAL BRUISING OR BLEEDING  TENDERNESS IN MOUTH AND THROAT WITH OR WITHOUT PRESENCE OF ULCERS  *URINARY PROBLEMS  *BOWEL PROBLEMS  UNUSUAL RASH Items with * indicate a potential emergency and should be followed up as soon as possible.  Feel free to call the clinic you have any questions or concerns. The clinic phone number is [phone].  Please show the [facility] at check-in to the Emergency Department and triage nurse.

## 2017-03-07 ENCOUNTER — Other Ambulatory Visit: Payer: Self-pay | Admitting: *Deleted

## 2017-03-07 DIAGNOSIS — C155 Malignant neoplasm of lower third of esophagus: Secondary | ICD-10-CM

## 2017-03-07 MED ORDER — HYDROMORPHONE HCL 1 MG/ML PO LIQD: ORAL | 0 refills | Status: DC

## 2017-03-07 NOTE — Telephone Encounter (Signed)
Received vm call from pt asking for hand written script for hydromorphone & would like to discuss next scheduled appt. Call back # is [phone]  Message routed to Dr Sherill/Pod RN

## 2017-03-07 NOTE — Telephone Encounter (Signed)
Returned call to pt, he had questions about chemo appt. Informed him next treatment will be scheduled at 4/5 office visit. He voiced understanding. Hydromorphone script left in book for pick up.

## 2017-03-08 MED FILL — HYDROMORPHONE 5 MG/5 ML SOL: 1 | 3 days supply | Qty: 60 | Fill #0

## 2017-03-09 ENCOUNTER — Other Ambulatory Visit: Payer: Self-pay | Admitting: Oncology

## 2017-03-09 DIAGNOSIS — C153 Malignant neoplasm of upper third of esophagus: Secondary | ICD-10-CM

## 2017-03-09 MED FILL — ZOLPIDEM TARTRATE 10 MG TAB: 10 | 30 days supply | Qty: 30 | Fill #0

## 2017-03-10 ENCOUNTER — Encounter (HOSPITAL_COMMUNITY): Payer: Self-pay

## 2017-03-10 ENCOUNTER — Emergency Department (HOSPITAL_COMMUNITY): Payer: BLUE CROSS/BLUE SHIELD

## 2017-03-10 ENCOUNTER — Emergency Department (HOSPITAL_COMMUNITY)
Admission: EM | Admit: 2017-03-10 | Discharge: 2017-03-10 | Disposition: A | Discharge status: Home / Self Care | Payer: BLUE CROSS/BLUE SHIELD | Attending: Emergency Medicine | Admitting: Emergency Medicine

## 2017-03-10 ENCOUNTER — Other Ambulatory Visit: Payer: Self-pay

## 2017-03-10 DIAGNOSIS — M545 Low back pain, unspecified: Secondary | ICD-10-CM

## 2017-03-10 DIAGNOSIS — R079 Chest pain, unspecified: Secondary | ICD-10-CM

## 2017-03-10 DIAGNOSIS — Z79899 Other long term (current) drug therapy: Secondary | ICD-10-CM | POA: Diagnosis not present

## 2017-03-10 DIAGNOSIS — K5903 Drug induced constipation: Secondary | ICD-10-CM | POA: Diagnosis not present

## 2017-03-10 DIAGNOSIS — Z8501 Personal history of malignant neoplasm of esophagus: Secondary | ICD-10-CM | POA: Diagnosis not present

## 2017-03-10 LAB — CBC WITH DIFFERENTIAL/PLATELET
Basophils Absolute: 0 10*3/uL (ref 0.0–0.1)
Basophils Relative: 0 %
Eosinophils Absolute: 0.2 10*3/uL (ref 0.0–0.7)
Eosinophils Relative: 2 %
HCT: 27.9 % — ABNORMAL LOW (ref 39.0–52.0)
Hemoglobin: 8.9 g/dL — ABNORMAL LOW (ref 13.0–17.0)
Lymphocytes Relative: 5 %
Lymphs Abs: 0.4 10*3/uL — ABNORMAL LOW (ref 0.7–4.0)
MCH: 27.6 pg (ref 26.0–34.0)
MCHC: 31.9 g/dL (ref 30.0–36.0)
MCV: 86.4 fL (ref 78.0–100.0)
Monocytes Absolute: 1.1 10*3/uL — ABNORMAL HIGH (ref 0.1–1.0)
Monocytes Relative: 12 %
Neutro Abs: 7 10*3/uL (ref 1.7–7.7)
Neutrophils Relative %: 81 %
Platelets: 140 10*3/uL — ABNORMAL LOW (ref 150–400)
RBC: 3.23 MIL/uL — ABNORMAL LOW (ref 4.22–5.81)
RDW: 18.4 % — ABNORMAL HIGH (ref 11.5–15.5)
WBC: 8.7 10*3/uL (ref 4.0–10.5)

## 2017-03-10 LAB — COMPREHENSIVE METABOLIC PANEL
ALT: 11 U/L — ABNORMAL LOW (ref 17–63)
AST: 16 U/L (ref 15–41)
Albumin: 3.7 g/dL (ref 3.5–5.0)
Alkaline Phosphatase: 136 U/L — ABNORMAL HIGH (ref 38–126)
Anion gap: 9 (ref 5–15)
BUN: 9 mg/dL (ref 6–20)
CO2: 28 mmol/L (ref 22–32)
Calcium: 9.4 mg/dL (ref 8.9–10.3)
Chloride: 99 mmol/L — ABNORMAL LOW (ref 101–111)
Creatinine, Ser: 0.52 mg/dL — ABNORMAL LOW (ref 0.61–1.24)
GFR calc Af Amer: >60 mL/min (ref >60)
GFR calc non Af Amer: >60 mL/min (ref >60)
Glucose, Bld: 118 mg/dL — ABNORMAL HIGH (ref 65–99)
Potassium: 4.1 mmol/L (ref 3.5–5.1)
Sodium: 136 mmol/L (ref 135–145)
Total Bilirubin: 0.7 mg/dL (ref 0.3–1.2)
Total Protein: 7.5 g/dL (ref 6.5–8.1)

## 2017-03-10 LAB — I-STAT TROPONIN, ED: Troponin i, poc: 0.01 ng/mL (ref 0.00–0.08)

## 2017-03-10 MED ORDER — SULFAMETHOXAZOLE-TRIMETHOPRIM 800-160 MG PO TABS: 1.0000 | ORAL_TABLET | Freq: Two times a day (BID) | ORAL | 0 refills | Status: DC

## 2017-03-10 MED ORDER — HYDROMORPHONE HCL 1 MG/ML IJ SOLN
0.5000 mg | Freq: Once | INTRAMUSCULAR | Status: AC
  Administered 2017-03-10: 0.5 mg via INTRAVENOUS
  Filled 2017-03-10: qty 0.5

## 2017-03-10 MED ORDER — HYDROMORPHONE HCL 1 MG/ML IJ SOLN
1.0000 mg | Freq: Once | INTRAMUSCULAR | Status: AC
  Administered 2017-03-10: 1 mg via INTRAVENOUS
  Filled 2017-03-10: qty 1

## 2017-03-10 MED ORDER — KETOROLAC TROMETHAMINE 15 MG/ML IJ SOLN
15.0000 mg | Freq: Once | INTRAMUSCULAR | Status: AC
  Administered 2017-03-10: 15 mg via INTRAVENOUS
  Filled 2017-03-10: qty 1

## 2017-03-10 MED ORDER — LORAZEPAM 2 MG/ML IJ SOLN
1.0000 mg | Freq: Once | INTRAMUSCULAR | Status: AC
  Administered 2017-03-10: 1 mg via INTRAVENOUS
  Filled 2017-03-10: qty 1

## 2017-03-10 NOTE — Discharge Instructions (Signed)
I suspect your lower abdominal pain is from constipation. This is a frequent side effect of the type of pain medication you take. Stay well hydrated. You can continue to take miralax. You may take this several times a day as needed until you have a good bowel movement. Enemas would likely be beneficial as well. Once you have a good bowel movement, I would make sure you regularly take a stool softener if on narcotic pain medication.   I suspect the small nodule just beneath your port may be a developing abscess. Use warm compresses over this area. Take antibiotics as prescribed. If this doesn't improve or progresses in terms of size, drainage, fever, etc then I want it to be rechecked.

## 2017-03-10 NOTE — ED Triage Notes (Signed)
He c/o upper chest area pain. EKG performed at triage. He ia pale and cachectic in appearance.

## 2017-03-10 NOTE — ED Provider Notes (Signed)
[facility] DEPT Provider Note   CSN: [id number] Arrival date & time: 03/10/17  1708  By signing my name below, I, Reola Mosher, attest that this documentation has been prepared under the direction and in the presence of Virgel Manifold, MD. Electronically Signed: Reola Mosher, ED Scribe. 03/10/17. 6:11 PM.  History   Chief Complaint Chief Complaint  Patient presents with  . Chest Pain  . Esophageal Cancer   The history is provided by the patient, medical records and the spouse. No language interpreter was used.   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 Per pt, his pain was initially more mild and intermittent, but today his pain acutely worsened and has been constant. His pain is nonexertional and non-pleuritic. Pt notes associated lower back and lower abdominal pain, but he also states that he has been unable to have a bowel movement in approximately two weeks. Pt does have a h/o constipation previously related to his lower back and abdominal pain. He also has a h/o lower leg swelling, but his wife states that this has mildly worsened over the past several weeks and pt states that he has experienced moderate pain to bilateral calves. Pt is currently prescribed Dilaudid at home which has been unable to control his pain. No h/o similar pain. He has previously undergone subxiphoid pericardial window for pericardial effusion w/ cardiac tamponade performed approximately one year ago. No PSHx to the abdomen. No Hx of PE/DVT or prior MI. He is currently undergoing infusions of chemiotherapy, and recently began cycle 2 of ELF. Denies urgency, frequency, hematuria, dysuria, difficulty urinating, fever, or any other associated symptoms.   Past Medical History:  Diagnosis Date  .  Anxiety   . Colon polyp   . Diverticulosis   . Esophageal cancer ([place]) 03/18/2013   Adenocarcinoma  . GERD (gastroesophageal reflux disease)   . History of radiation therapy 04/07/13-05-15-13   Esopageal ca,50.4Gy/70f and again 2015 [place] [facility]  . Hyperlipemia   . Pre-diabetes   . Shortness of breath dyspnea   . Sleep apnea    no c-pap at this time   Patient Active Problem List   Diagnosis Date Noted  . Bone metastasis ([facility] 01/05/2017  . Shoulder pain, left 10/06/2016  . Dysphagia   . Esophageal stricture   . Port catheter in place 05/12/2016  . Chest pain 05/02/2016  . Dyspnea 05/02/2016  . Paroxysmal atrial fibrillation ([facility] 05/02/2016  . Mass in chest 05/02/2016  . Malignant neoplasm of esophagus ([facility]   . Knee pain   . Pain   . Atrial fibrillation, new onset ([facility] 04/25/2016  . Elevated liver enzymes   . Goals of care, counseling/discussion   . Acute respiratory failure with hypoxia ([facility] 04/21/2016  . Pericardial effusion with cardiac tamponade 04/21/2016  . Leukocytosis 04/21/2016  . Anemia of chronic disease 04/21/2016  . Hilar adenopathy 04/21/2016  . Lymphatic obstruction 04/21/2016  . HCAP (healthcare-associated pneumonia) 04/20/2016  . Cancer of distal third of esophagus ([facility] 03/28/2013   Past Surgical History:  Procedure Laterality Date  . APPENDECTOMY    . BALLOON DILATION N/A 06/05/2016   Procedure: BALLOON DILATION;  Surgeon: JIrene Shipper MD;  [facility];  Service: Endoscopy;  Laterality: N/A;  . COLONOSCOPY    . COMPLETE  ESOPHAGECTOMY N/A 07/07/2013   Procedure: ESOPHAGECTOMY COMPLETE;  Surgeon: Grace Isaac, MD;  Location: [facility];  Service: Thoracic;  Laterality: N/A;  transhiatel esophagectomy, feeding jejunostomy  . EDG  03/18/2013   Esophageal Mass  . ESOPHAGOGASTRODUODENOSCOPY N/A 07/02/2014   Procedure: ESOPHAGOGASTRODUODENOSCOPY (EGD);  Surgeon: Gatha Mayer, MD;  Location: [facility];  Service: Endoscopy;  Laterality:  N/A;  . ESOPHAGOGASTRODUODENOSCOPY N/A 02/16/2015   Procedure: ESOPHAGOGASTRODUODENOSCOPY (EGD);  Surgeon: Lafayette Dragon, MD;  Location: [facility] ENDOSCOPY;  Service: Endoscopy;  Laterality: N/A;  . ESOPHAGOGASTRODUODENOSCOPY N/A 09/29/2015   Procedure: ESOPHAGOGASTRODUODENOSCOPY (EGD);  Surgeon: Ladene Artist, MD;  Location: [facility] ENDOSCOPY;  Service: Endoscopy;  Laterality: N/A;  . ESOPHAGOGASTRODUODENOSCOPY N/A 06/05/2016   Procedure: ESOPHAGOGASTRODUODENOSCOPY (EGD);  Surgeon: Irene Shipper, MD;  Location[facility] ENDOSCOPY;  Service: Endoscopy;  Laterality: N/A;  . EUS N/A 06/25/2014   Procedure: UPPER ENDOSCOPIC ULTRASOUND (EUS) LINEAR;  Surgeon: Milus Banister, MD;  Location: [facility] ENDOSCOPY;  Service: Endoscopy;  Laterality: N/A;  . LIPOMA EXCISION    . SUBXYPHOID PERICARDIAL WINDOW N/A 04/21/2016   Procedure: SUBXYPHOID PERICARDIAL WINDOW;  Surgeon: Melrose Nakayama, MD;  Location: [facility];  Service: Thoracic;  Laterality: N/A;  . UPPER GASTROINTESTINAL ENDOSCOPY    . VIDEO BRONCHOSCOPY N/A 07/07/2013   Procedure: VIDEO BRONCHOSCOPY;  Surgeon: Grace Isaac, MD;  Location: [facility] OR;  Service: Thoracic;  Laterality: N/A;  . vocal chord surgery     pushed right vocal cord more centered- September 2015    Home Medications    Prior to Admission medications   Medication Sig Start Date End Date Taking? Authorizing Provider  acetaminophen (TYLENOL) 500 MG tablet Take 1,000 mg by mouth every 6 (six) hours as needed for mild pain or moderate pain.    Historical Provider, MD  ALPRAZolam Duanne Moron) 0.5 MG tablet TAKE 1 TABLET BY MOUTH 3 TIMES DAILY AS NEEDED FOR ANXIETY 12/08/16   Ladell Pier, MD  Ascorbic Acid (VITAMIN C) 1000 MG tablet Take 1,000 mg by mouth 3 (three) times daily.    Historical Provider, MD  cholecalciferol (VITAMIN D) 1000 units tablet Take 1,000 Units by mouth daily.    Historical Provider, MD  cyclobenzaprine (FLEXERIL) 10 MG tablet Take 1 tablet (10 mg total) by mouth 3 (three) times  daily as needed for muscle spasms. 01/29/17   Ladell Pier, MD  HYDROmorphone HCl (DILAUDID) 1 MG/ML LIQD Take 4-6 mls (4-6 mg total) every 6 hrs as needed. Do not take with oxycodone 03/07/17   Owens Shark, NP  ibuprofen (ADVIL,MOTRIN) 200 MG tablet Take 400 mg by mouth every 8 (eight) hours as needed for moderate pain. Reported on 05/12/2016    Historical Provider, MD  lactase (LACTAID) 3000 UNITS tablet Take 3,000 Units by mouth daily as needed (lactose intolerance). Reported on 05/12/2016    Historical Provider, MD  lidocaine-prilocaine (EMLA) cream Apply 1 application topically as needed. Apply tablespoon to PAC 2 hours prior to stick and cover with plastic wrap 11/09/15   Ladell Pier, MD  mirtazapine (REMERON) 30 MG tablet TAKE 1 TABLET BY MOUTH AT BEDTIME. 02/14/17   Ladell Pier, MD  morphine (MS CONTIN) 30 MG 12 hr tablet Take 1 tablet (30 mg total) by mouth every 12 (twelve) hours. DO NOT DRIVE WHILE TAKING THIS MEDICATION. 02/27/17   Owens Shark, NP  oxyCODONE (ROXICODONE) 5 MG/5ML solution Take 5 mLs (5 mg total) by mouth every 4 (four) hours as needed for severe  pain. 02/27/17   Owens Shark, NP  polyethylene glycol [facility] / GLYCOLAX) packet Take 17 g by mouth daily as needed.    Historical Provider, MD  pregabalin (LYRICA) 50 MG capsule Take 1 capsule (50 mg total) by mouth 2 (two) times daily. 02/08/17   Ladell Pier, MD  prochlorperazine (COMPAZINE) 10 MG tablet Take 1 tablet (10 mg total) by mouth every 6 (six) hours as needed for nausea or vomiting. 02/28/17   Owens Shark, NP  sennosides-docusate sodium (SENOKOT-S) 8.6-50 MG tablet Take 1 tablet by mouth daily.    Historical Provider, MD  zolpidem (AMBIEN) 10 MG tablet TAKE 1 TABLET BY MOUTH AT BEDTIME AS NEEDED FOR SLEEP 03/09/17   Ladell Pier, MD   Family History Family History  Problem Relation Age of Onset  . Colon cancer Mother     dx in her 75s  . Diabetes Mother   . Breast cancer Maternal Aunt 81  . Diabetes  Maternal Aunt   . Esophageal cancer Maternal Aunt 78  . Kidney failure Brother 76  . Hypertension Brother 52  . Stomach cancer Neg Hx   . Rectal cancer Neg Hx    Social History Social History  Substance Use Topics  . Smoking status: Never Smoker  . Smokeless tobacco: Never Used  . Alcohol use No   Allergies   Lactose intolerance (gi)  Review of Systems Review of Systems  Cardiovascular: Positive for chest pain.  Gastrointestinal: Positive for abdominal pain and constipation.  Genitourinary: Negative for difficulty urinating, dysuria, frequency, hematuria and urgency.  Musculoskeletal: Positive for back pain.  All other systems reviewed and are negative.  Physical Exam Updated Vital Signs BP (!) 142/89 (BP Location: Left Arm)   Pulse (!) 121   Temp 98.1 F (36.7 C) (Oral)   Resp 20   SpO2 97%   Physical Exam  Constitutional: He appears well-developed and well-nourished.  Appears uncomfortable, rocking in pain.   HENT:  Head: Normocephalic.  Right Ear: External ear normal.  Left Ear: External ear normal.  Nose: Nose normal.  Eyes: Conjunctivae are normal. Right eye exhibits no discharge. Left eye exhibits no discharge.  Neck: Normal range of motion.  Tracheostomy tube is present and in place.   Cardiovascular: Regular rhythm and normal heart sounds.  Tachycardia present.   No murmur heard. Pulmonary/Chest: Effort normal and breath sounds normal. No respiratory distress. He has no wheezes. He has no rales.  Port-o-cath placed in right chest. There is an indurated subcutaneous nodule to the right anterior chest wall several cm inferior and medial to his port-o-cath, ?developing abscess.   Abdominal: Soft. There is tenderness. There is no rebound and no guarding.  Tenderness is noted across lower abdomen.   Musculoskeletal: Normal range of motion. He exhibits edema. He exhibits no tenderness.  There is symmetric pitting lower extremity edema.   Neurological: He is  alert. No cranial nerve deficit. Coordination normal.  Skin: Skin is warm and dry. No rash noted. No erythema. No pallor.  Psychiatric: He has a normal mood and affect. His behavior is normal.  Nursing note and vitals reviewed.  ED Treatments / Results  DIAGNOSTIC STUDIES: Oxygen Saturation is 97% on RA, normal by my interpretation.   COORDINATION OF CARE: 5:50 PM-Discussed next steps with pt. Pt verbalized understanding and is agreeable with the plan.   Labs (all labs ordered are listed, but only abnormal results are displayed) Labs Reviewed  CBC WITH DIFFERENTIAL/PLATELET - Abnormal; Notable  for the following:       Result Value   RBC 3.23 (*)    Hemoglobin 8.9 (*)    HCT 27.9 (*)    RDW 18.4 (*)    Platelets 140 (*)    Lymphs Abs 0.4 (*)    Monocytes Absolute 1.1 (*)    All other components within normal limits  COMPREHENSIVE METABOLIC PANEL - Abnormal; Notable for the following:    Chloride 99 (*)    Glucose, Bld 118 (*)    Creatinine, Ser 0.52 (*)    ALT 11 (*)    Alkaline Phosphatase 136 (*)    All other components within normal limits  I-STAT TROPOININ, ED    EKG  EKG Interpretation None      Radiology No results found.  Procedures Procedures   Medications Ordered in ED Medications - No data to display  Initial Impression / Assessment and Plan / ED Course  I have reviewed the triage vital signs and the nursing notes.  Pertinent labs & imaging results that were available during my care of the patient were reviewed by me and considered in my medical decision making (see chart for details).     50yM with multiple complaints. Now feeling much better after meds and would like to go home. Small indurated nodule to R chest wall several cm away from port. Possibly developing an abscess. Will place on bactrim. Return precautions discussed.   Final Clinical Impressions(s) / ED Diagnoses   Final diagnoses:  Chest pain, unspecified type  Drug-induced  constipation  Low back pain without sciatica, unspecified back pain laterality, unspecified chronicity   New Prescriptions Discharge Medication List as of 03/10/2017  8:07 PM    START taking these medications   Details  sulfamethoxazole-trimethoprim (BACTRIM DS,SEPTRA DS) 800-160 MG tablet Take 1 tablet by mouth 2 (two) times daily., Starting Sat 03/10/2017, Until Sat 03/17/2017, Print       I personally preformed the services scribed in my presence. The recorded information has been reviewed is accurate. Virgel Manifold, MD.     Virgel Manifold, MD 03/18/17 [phone]

## 2017-03-10 NOTE — ED Notes (Signed)
Patient transported to X-ray 

## 2017-03-10 NOTE — ED Notes (Signed)
Patient complain of difficulties breathing. Pt was suction and removed small mucus plug. Pt state he fell better and RT call for farther care.

## 2017-03-10 NOTE — Progress Notes (Signed)
RT cleaned inner cannula of #6 Cuffless Jackson trach and placed back to PT airway (site below trach flange is red and warm). PT denies usage of 02 at home but states he utilizes humidity at times (PT refuses ATC for humidity at this time, but remains in room). Sp02 on RA 100%, HR 113, BP 122/94, RR 17, BBS clear diminished. RT placed new trach tie- uneventful and secure at this time.

## 2017-03-12 ENCOUNTER — Telehealth: Payer: Self-pay | Admitting: *Deleted

## 2017-03-12 NOTE — Telephone Encounter (Signed)
"  I went to the ED 03-10-2017 due to chest pain I'm experiencing in the area of the 20 mm nodule in my chest.  Could Dr. Benay Spice look at the films and talk to me about the results, I'm concerned.  I do not need anything unless Dr. Benay Spice wants to see me sooner than the appointment already scheduled Thursday/ 03-15-2017."  Rates pain as a "four to five on pain scale: at time of call.

## 2017-03-12 NOTE — Telephone Encounter (Signed)
Call placed back to patient to inform him per Dr. Benay Spice to return to office as scheduled on Thursday, 03/15/17 and no changes at this time.  Patient appreciative of call back.

## 2017-03-13 ENCOUNTER — Telehealth: Payer: Self-pay | Admitting: Internal Medicine

## 2017-03-13 DIAGNOSIS — R1310 Dysphagia, unspecified: Secondary | ICD-10-CM

## 2017-03-13 NOTE — Telephone Encounter (Signed)
Pt having problems swallowing and requesting EGD with dil. He scheduled first available OV in May but would like to know if he can be a direct procedure. Please advise.

## 2017-03-13 NOTE — Telephone Encounter (Signed)
Okay to schedule my hospital week in May with MAC/propofol

## 2017-03-14 ENCOUNTER — Other Ambulatory Visit: Payer: Self-pay

## 2017-03-14 DIAGNOSIS — R1310 Dysphagia, unspecified: Secondary | ICD-10-CM

## 2017-03-14 NOTE — Telephone Encounter (Signed)
Pt scheduled for EGD with dil, mac at [facility] 04/16/17_0 :30am. Pt aware of appt and knows to be NPO after midnight. Referral in epic.

## 2017-03-15 ENCOUNTER — Other Ambulatory Visit: Payer: BLUE CROSS/BLUE SHIELD

## 2017-03-15 ENCOUNTER — Ambulatory Visit (HOSPITAL_BASED_OUTPATIENT_CLINIC_OR_DEPARTMENT_OTHER): Payer: BLUE CROSS/BLUE SHIELD

## 2017-03-15 ENCOUNTER — Ambulatory Visit (HOSPITAL_BASED_OUTPATIENT_CLINIC_OR_DEPARTMENT_OTHER): Payer: BLUE CROSS/BLUE SHIELD | Admitting: Oncology

## 2017-03-15 VITALS — BP 133/83 | HR 123 | Temp 98.7°F | Resp 18 | Ht 73.0 in | Wt 164.2 lb

## 2017-03-15 DIAGNOSIS — G893 Neoplasm related pain (acute) (chronic): Secondary | ICD-10-CM | POA: Diagnosis not present

## 2017-03-15 DIAGNOSIS — R1310 Dysphagia, unspecified: Secondary | ICD-10-CM | POA: Diagnosis not present

## 2017-03-15 DIAGNOSIS — C7951 Secondary malignant neoplasm of bone: Secondary | ICD-10-CM

## 2017-03-15 DIAGNOSIS — C155 Malignant neoplasm of lower third of esophagus: Secondary | ICD-10-CM

## 2017-03-15 DIAGNOSIS — Z95828 Presence of other vascular implants and grafts: Secondary | ICD-10-CM

## 2017-03-15 DIAGNOSIS — C153 Malignant neoplasm of upper third of esophagus: Secondary | ICD-10-CM

## 2017-03-15 MED ORDER — MORPHINE SULFATE (PF) 4 MG/ML IV SOLN
INTRAVENOUS | Status: AC
  Filled 2017-03-15: qty 2

## 2017-03-15 MED ORDER — MORPHINE SULFATE (CONCENTRATE) 10 MG /0.5 ML PO SOLN: 20.0000 mg | ORAL | 0 refills | Status: DC | PRN

## 2017-03-15 MED ORDER — MORPHINE SULFATE (PF) 4 MG/ML IV SOLN
INTRAVENOUS | Status: AC
  Filled 2017-03-15: qty 1

## 2017-03-15 MED ORDER — MORPHINE SULFATE ER 60 MG PO TBCR: 60.0000 mg | EXTENDED_RELEASE_TABLET | Freq: Two times a day (BID) | ORAL | 0 refills | Status: DC

## 2017-03-15 MED ORDER — HEPARIN SOD (PORK) LOCK FLUSH 100 UNIT/ML IV SOLN
500.0000 [IU] | Freq: Once | INTRAVENOUS | Status: AC | PRN
  Administered 2017-03-15: 500 [IU] via INTRAVENOUS
  Filled 2017-03-15: qty 5

## 2017-03-15 MED ORDER — MORPHINE SULFATE 4 MG/ML IJ SOLN
4.0000 mg | Freq: Once | INTRAMUSCULAR | Status: AC
  Administered 2017-03-15: 4 mg via INTRAVENOUS
  Filled 2017-03-15: qty 1

## 2017-03-15 MED ORDER — MORPHINE SULFATE 4 MG/ML IJ SOLN
8.0000 mg | Freq: Once | INTRAMUSCULAR | Status: AC
  Administered 2017-03-15: 8 mg via INTRAVENOUS
  Filled 2017-03-15: qty 2

## 2017-03-15 MED ORDER — SODIUM CHLORIDE 0.9 % IJ SOLN
10.0000 mL | INTRAMUSCULAR | Status: DC | PRN
  Administered 2017-03-15: 10 mL via INTRAVENOUS
  Filled 2017-03-15: qty 10

## 2017-03-15 MED FILL — MORPHINE SULF 100 MG/5 ML S: 100 | 3 days supply | Qty: 30 | Fill #0

## 2017-03-15 MED FILL — MORPHINE SULF 60 MG TAB SA: 60 | 30 days supply | Qty: 60 | Fill #0

## 2017-03-15 NOTE — Progress Notes (Signed)
Joel Osborne OFFICE PROGRESS NOTE   Diagnosis: Gastroesophageal carcinoma  INTERVAL HISTORY:   Joel Osborne returns for a scheduled visit. He completed cycle 2 ELF chemotherapy 03/01/2017. He complains of increased pain, not relieved with MS Contin and Dilaudid. He has pain in the back, right chest, and legs. He has noted a masslike lesion at the right chest wall. He was seen in the emergency room on 03/10/2017. He was placed on antibiotics for a possible "abscess "at the right chest wall.  He reports solid dysphagia. He is here today with his wife.   Objective:  Vital signs in last 24 hours:  Blood pressure 133/83, pulse (!) 123, temperature 98.7 F (37.1 C), temperature source Oral, resp. rate 18, height '6\' 1"'$  (1.854 m), weight 164 lb 3.2 oz (74.5 kg), SpO2 97 %.    HEENT: No thrush Resp: Good air movement bilaterally, scattered rhonchi, no respiratory distress Cardio: Regular rate and rhythm, tachycardia GI: Mild tenderness in the right upper abdomen, no hepatosplenomegaly Vascular: Pitting edema at the lower legs Neuro: Alert and oriented, he was able to ambulate to the exam table  Musculoskeletal: Firm mass at the right anterior chest wall medial to the Port-A-Cath   Portacath/PICC-without erythema  Lab Results:  Lab Results  Component Value Date   WBC 8.7 03/10/2017   HGB 8.9 (L) 03/10/2017   HCT 27.9 (L) 03/10/2017   MCV 86.4 03/10/2017   PLT 140 (L) 03/10/2017   NEUTROABS 7.0 03/10/2017     Medications: I have reviewed the patient's current medications.  Assessment/Plan: 1.Adenocarcinoma of the distal esophagus/gastric cardia-status post an endoscopic biopsy on 03/18/2013 confirming adenocarcinoma, HER-2/neunot amplified  -Staging CT scans 03/20/2013 confirmed a distal esophageal/gastric cardia mass, distal paraesophageal lymph nodes and paratracheal nodes concerning for metastatic lymphadenopathy  -A PET scan 03/27/2013 with a multifocal  area of hypermetabolic activity involving the thoracic esophagus extending into the gastric cardia and hypermetabolic mediastinal nodes, no distant metastatic disease  -Initiation of radiation 04/07/2013 and weekly Taxol/carboplatin 04/08/2013, radiation completed 05/15/2013  -Restaging PET scan 06/09/2013-no evidence of metastatic disease, no hypermetabolic lymphadenopathy  -Esophagogastrectomy 07/07/2013 revealed a ypT1b,ypN0 tumor with negative surgical margins  -CT neck 06/09/2014 revealed a necrotic lymph node in the right tracheoesophageal groove  -PET scan 11/15/2014 revealed a single focus of hypermetabolism corresponding to the necrotic right upper mediastinum lymph node -EUS biopsy of the paraesophageal lymph node 06/25/2012 confirmed metastatic adenocarcinoma  -Radiation completed 08/11/2014 -Rising CEA May 2016 -PET scan 07/13/2015 with hypermetabolic right paratracheal and right hilar adenopathy -Cycle 1 FOLFOX 08/03/2015 -Cycle 2 FOLFOX 08/17/2015 - cycle 3 FOLFOX 08/31/2015 (oxaliplatin dose reduced secondary to thrombocytopenia)  -Cycle 4 FOLFOX 09/14/2015 -Cycle 5 FOLFOX 09/28/2015 - restaging CTs 09/30/2015 with stable disease involving neck/chest lymph nodes, a slight increase in size of left upper lobe lung nodule  - cycle 6 FOLFOX 10/12/2015  -FOLFOX discontinued secondary to development of bilateral vocal cord paralysis November 2016 - CT scan of the neck and chest on 11/22/15 revealed no definite disease in the neck. At the thoracic inlet region, there is questionably slight increase in prominence of soft tissue at the right tracheoesophageal groove. This could be an enlarging node or mass but could also relate to the esophagectomy and pull-through surgery.Enlarging lingular and left lower lobe nodules are worrisome for metastatic disease. Peribronchovascular nodular consolidation in the anterior right lower lobe, slightly more prominent, indeterminate.  Cholelithiasis. Left diaphragmatic hernia, stable. -Xeloda 1 week on/1 week off beginning 12/11/2015 -Restaging CTs of the  neck and chest 02/07/2016 revealed enlargement of 2 left-sided lung nodules, no other evidence of disease progression -Cycle 1 Pembrolizumab 02/25/2016  -Cycle 2 Pembrolizumab 03/17/2016 -Cycle 3 Pembrolizumab 04/07/2016   -CT 04/20/2016 consistent with progressive disease involving a large pericardial effusion, bilateral pleural effusions, enlarged mediastinal lymph nodes and lung nodules  Cytology from the pericardial fluid 04/21/2016-positive for metastatic adenocarcinoma -CT 05/01/2016 was slight worsening of right hilar lymphadenopathy with near complete collapse of the right middle lobe, nodular metastases in the left lung, stable bilateral pleural effusions  Cycle 1 Taxol/ramucirumab 05/12/2016; day 8 Taxol 05/19/2016; day 15 Taxol/ramucirumab 05/26/2016.  Cycle 2 day 1 Taxol/ramucirumab 06/22/2016  Cycle 2 day 8 Taxol 06/29/2016   Cycle 2 day 15 Taxol/ramucirumab07/28/2017  Cycle 3 day 1 Taxol/ramucirumab 07/20/2016  Cycle 3 day 8 Taxol 07/27/2016  Cycle 3 day 15 Taxol/ramucirumab08/24/2017  Restaging chest CT 08/10/2016-left upper lobe pulmonary nodule similar; left lower lobe pulmonary nodule may have enlarged minimally; improved appearance of the mediastinum and right infrahilar region with decreased soft tissue fullness; improvement in probable left infrahilar adenopathy; decreased left and resolved right pleural effusions; developing right adrenal nodularity; T7 vertebral body lesion suspicious for osseous metastasis.  Cycle 4 Taxol/ramucirumab09/06/2016   cycle 5 Taxol/ramucirumab10/03/2016  restaging chest CT 10/05/2016 with slight enlargement of mediastinal lymph nodes and pulmonary nodules, enlargement of left adrenal lesion,? Lymphatic tumor spread in the right lung  Cycle 6 Taxol/ ramucirumab11/01/2016; Taxol placed on hold  beginning 10/26/2016 due to neuropathy symptoms, continuation of ramucirumab alone  Cycle 7Taxol/ ramucirumab11/30/2017-Taxol remains on hold  restaging chest CT 12/08/2016-minimal increase in size of a left hilar node and left lung nodule. Mild increase in the size of a right adrenal metastasis  Cycle 8 Taxol/ ramucirumab01/03/2017-Taxol resumed01/10/2017  CT lumbar spine 12/28/2016 with infiltrative tumor at L3 with mild L3 pathologic fracture. S2 lytic lesion/metastasis. New 3.5 x 2.3 cm right adrenal mass.  Initiation of palliative radiation to the lumbar spine 01/01/2017; completed 01/12/2017  Bone scan 01/25/2017-multiple areas of increased uptake including the cervical, thoracic, and lumbar spine. Left pubis, left femur.  Cycle 1 salvage chemotherapy with ELF 01/31/2017  Initiation of palliative radiation to the upper and lower left femur 02/05/2017  Cycle 2 ELF 02/27/2017  2. Indeterminate 15 mm right liver lesion on the CT scan 03/20/2013, MRI of the liver 06/09/2013 confirmed a right liver hemangioma  3. Anorexia/weight loss and solid dysphagia secondary to #1 -improved 4. History of a colon polyp, status post a polypectomy October 2013  5. history of Thrombocytopenia secondary chemotherapy-the carboplatin wase dose reduced with week 5 of chemotherapy chemotherapy  6. hoarseness secondary to right vocal cord paralysis-CT/PET scan imaging consistent with a malignant right paratracheal lymph node causing the vocal cord paralysis  -Status post a laryngeal plasty at [facility] on 09/02/2014 7. Esophageal stricture-status post dilation 06/08/2015 -Food impaction removal 09/29/2015 -Esophageal stricture dilation 10/06/2015 8. Port-A-Cath placement 07/20/2015 9. Delayed nausea following cycle 1 FOLFOX. Aloxi and Emend added with cycle 2. 10. Thrombocytopenia secondary to chemotherapy  11. Emotional lability/depression-prophylactic Decadron discontinued; Remeron initiated 12.  Progressive hoarseness November 2016, diagnosed with bilateral vocal cord paralysis, status post a tracheostomy and bilateral injection laryngoplasty at [facility] on 10/27/2015 13. Pericardial window 04/21/2016  14. High fever 07/06/2016-chest x-ray suggestive of possible left lower lobe pneumonia-treated with Augmentin  15. 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 Taxol placed on hold beginning 10/26/2016.Lyrica added 11/23/2016 16. Depression-Remeron dose increased to 30 mg 11/23/2016 17. Back pain-potentially related  to progressive esophagus cancer; CT 11/28/2016 with no bone lesion, bony distraction or fracture. Moderately severe lumbar facet arthritis greatest at L4-5 on the right. Good preservation of the intravertebral disc spaces. Stable, degenerative appearing, minimal anterolisthesis at L4-5.CT lumbar spine 12/28/2016-infiltrative tumor L3 with mild L3 pathologic fracture. S2 lytic lesion/metastasis. New 3.5 x 2.3 cm adrenal mass consistent with metastasis. Tumor mildly narrows the right L3-4 neural foramen. 18. Pain at multiple sites including the mid thoracic region, right groin, left knee.   Palliative radiation to the upper and lower left femur initiated 02/05/2017, completed 02/14/2017.     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 He has metastatic disease involving multiple bone sites. He now has a metastasis at the right anterior chest wall. He has pain in multiple sites. The pain is not relieved with the current narcotic regimen. He will be given IV morphine at the Cancer center today. He will increase the MS Contin to 60 mg twice daily. We added Roxanol for breakthrough pain.  His overall performance status is declining. I recommend Hospice care. He is in agreement. The CPR and ACLS issues. He agrees to a no CODE BLUE status.  We will make a [facility] hospice referral today. He will return for an office visit in  approximately 10 days. We will see him sooner as needed.  25 minutes were spent with the patient today. The majority of the time was used for counseling and coordination of care.  03/15/2017  2:11 PM

## 2017-03-15 NOTE — Progress Notes (Signed)
Patient received '8mg'$  of morphine at 0858 with 30 minutes of NS. Patients pain was an 8 out of 10 in his lower back before starting pain meds. He now rates it a 5/10 pain and is requesting more pain medication. Verbal order received from MD for '4mg'$  of morphine with NS for 30 minutes. Orders placed.    1028 Pain reassessment 2/10 in lower back- patient states when moving it comes back to a 4/10. Informed MD who states patient can take his morphine as soon as he gets home. Patient aware, port de-accessed and taken to lobby via wheelchair

## 2017-03-19 ENCOUNTER — Telehealth: Payer: Self-pay | Admitting: *Deleted

## 2017-03-19 MED ORDER — MORPHINE SULFATE (CONCENTRATE) 10 MG /0.5 ML PO SOLN: 20.0000 mg | ORAL | 0 refills | Status: DC | PRN

## 2017-03-19 MED FILL — MORPHINE SULF 100 MG/5 ML S: 100 | 6 days supply | Qty: 60 | Fill #0

## 2017-03-19 NOTE — Telephone Encounter (Signed)
Unable to reach hospice RN at main #. (Closed) Called pt's wife, she reports he has been able to swallow MS Contin without difficulty. He is taking 86m Roxanol Q3 hours with some relief. She understands dose can be increased to 1.545mif needed. Refill faxed to [facility].

## 2017-03-19 NOTE — Telephone Encounter (Signed)
"  This is Dietitian with [employer] calling asking for you all to fax a refill for morphine (Roxanol) for this patient to [facility].  What was sent last week was just a three day supply."  Will notify Collabor[facility].

## 2017-03-20 ENCOUNTER — Telehealth: Payer: Self-pay | Admitting: *Deleted

## 2017-03-20 NOTE — Telephone Encounter (Signed)
Call from [place], [facility] RN requesting last office note. Updated her regarding pt's medications and discussion with wife on 4/9. Notes faxed via EPIC.

## 2017-03-22 ENCOUNTER — Ambulatory Visit (INDEPENDENT_AMBULATORY_CARE_PROVIDER_SITE_OTHER): Payer: BLUE CROSS/BLUE SHIELD | Admitting: Nurse Practitioner

## 2017-03-22 ENCOUNTER — Encounter: Payer: Self-pay | Admitting: Nurse Practitioner

## 2017-03-22 VITALS — BP 98/56 | HR 119 | Ht 72.0 in | Wt 159.1 lb

## 2017-03-22 DIAGNOSIS — K5903 Drug induced constipation: Secondary | ICD-10-CM

## 2017-03-22 DIAGNOSIS — R1310 Dysphagia, unspecified: Secondary | ICD-10-CM | POA: Diagnosis not present

## 2017-03-22 DIAGNOSIS — R1319 Other dysphagia: Secondary | ICD-10-CM

## 2017-03-22 NOTE — Progress Notes (Signed)
HPI: Patient is a 51 year old male known to Dr. Henrene Pastor. He has a history of metastatic adenocarcinoma of distal esophagus with proximal stomach involvement. He is status post neoadjuvant chemoradiation followed by esophagogastrectomy July 2014. Developed hoarseness in 2015, saw ENT, found to have paratracheal node causing vocal cord paralysis. EUS with FNA confirmed metastatatic adenocarcinoma . Wasn't candidate for more radiation, Dr. Servando Snare didn't recommend surgery. In 2015 referred to [facility] where he had extensive workup / staging. Found to have mediastinal and hilar lymph nodes, treated with FOLFOX.   He was hospitalized last year with pericardial effusion, pericardial window confirmed metastatic disease. In 2017 he was started on salvage chemo but disease has progressed and now with chest wall met, lung nodules and bone mets. Requiring narcotics to manage pain. Oncology has recommended Hospice.   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 He has now begun having recurrent dysphagia to solids. Liquids can be problematic but to a lesser degree. Patient is already scheduled for EGD with dilation with Dr. Henrene Pastor in early May. In addition to dysphagia he has a poor appetite, frequent nausea. Taking anti-emetics which help nausea. He is on Remeron which should should stimulate his appetite. He cannot tolerate Ensure. Has problems with dairy / creamy products. He doesn't like taste of Boost.   Patient's only other complaint is that of constipation. He wasn't taking the miralax on a daily basis but now he is and had a large BM yesterday.    Past Medical History:  Diagnosis Date  . Anxiety   . Colon polyp   . Diverticulosis   . Esophageal cancer ([place]) 03/18/2013   Adenocarcinoma  . GERD (gastroesophageal reflux disease)   . History of radiation therapy 04/07/13-05-15-13   [place] ca,50.4Gy/41f [place]  . Hyperlipemia   . Pre-diabetes   . Shortness of breath dyspnea   . Sleep apnea    no c-pap at this time     Past Surgical History:  Procedure Laterality Date  . APPENDECTOMY    . BALLOON DILATION N/A 06/05/2016   Procedure: BALLOON DILATION;  Surgeon: JIrene Shipper MD;  [facility]  Service: Endoscopy;  Laterality: N/A;  . COLONOSCOPY    . COMPLETE ESOPHAGECTOMY N/A 07/07/2013   Procedure: ESOPHAGECTOMY COMPLETE;  Surgeon: EGrace Isaac MD;  [place]  Service: Thoracic;  Laterality: N/A;  transhiatel esophagectomy, feeding jejunostomy  . EDG  03/18/2013   Esophageal Mass  . ESOPHAGOGASTRODUODENOSCOPY N/A 07/02/2014   Procedure: ESOPHAGOGASTRODUODENOSCOPY (EGD);  Surgeon: CGatha Mayer MD;  [facility];  Service: Endoscopy;  Laterality: N/A;  . ESOPHAGOGASTRODUODENOSCOPY N/A 02/16/2015   Procedure: ESOPHAGOGASTRODUODENOSCOPY (EGD);  Surgeon: DLafayette Dragon MD;  [facility];  Service: Endoscopy;  Laterality: N/A;  . ESOPHAGOGASTRODUODENOSCOPY N/A 09/29/2015   Procedure: ESOPHAGOGASTRODUODENOSCOPY (EGD);  Surgeon: MLadene Artist MD;  [facility];  Service: Endoscopy;  Laterality: N/A;  . ESOPHAGOGASTRODUODENOSCOPY N/A 06/05/2016   Procedure: ESOPHAGOGASTRODUODENOSCOPY (EGD);  Surgeon: JIrene Shipper MD;  [facility]  Service: Endoscopy;  Laterality: N/A;  . EUS N/A 06/25/2014   Procedure: UPPER ENDOSCOPIC ULTRASOUND (EUS) LINEAR;  Surgeon: DMilus Banister MD;  [facility];  Service: Endoscopy;  Laterality: N/A;  . LIPOMA EXCISION    . SUBXYPHOID PERICARDIAL WINDOW N/A 04/21/2016   Procedure: SUBXYPHOID PERICARDIAL WINDOW;  Surgeon: SMelrose Nakayama MD;  [facility];  Service: Thoracic;  Laterality: N/A;  . UPPER GASTROINTESTINAL ENDOSCOPY    . VIDEO BRONCHOSCOPY N/A 07/07/2013   Procedure: VIDEO BRONCHOSCOPY;  Surgeon: Grace Isaac, MD;  Location[facility];  Service: Thoracic;   Laterality: N/A;  . vocal chord surgery     pushed right vocal cord more centered- September 2015   Family History  Problem Relation Age of Onset  . Colon cancer Mother     dx in her 44s  . Diabetes Mother   . Breast cancer Maternal Aunt 81  . Diabetes Maternal Aunt   . Esophageal cancer Maternal Aunt 78  . Kidney failure Brother 80  . Hypertension Brother 68  . Stomach cancer Neg Hx   . Rectal cancer Neg Hx    Social History  Substance Use Topics  . Smoking status: Never Smoker  . Smokeless tobacco: Never Used  . Alcohol use No   Current Outpatient Prescriptions  Medication Sig Dispense Refill  . acetaminophen (TYLENOL) 500 MG tablet Take 1,000 mg by mouth every 6 (six) hours as needed.    . ALPRAZolam (XANAX) 0.5 MG tablet TAKE 1 TABLET BY MOUTH 3 TIMES DAILY AS NEEDED FOR ANXIETY (Patient taking differently: TAKE 1 TABLET BY MOUTH 2 TIMES DAILY AS NEEDED FOR ANXIETY) 60 tablet 0  . cholecalciferol (VITAMIN D) 1000 units tablet Take 1,000 Units by mouth daily.    . cyclobenzaprine (FLEXERIL) 10 MG tablet Take 1 tablet (10 mg total) by mouth 3 (three) times daily as needed for muscle spasms. 90 tablet 0  . docusate sodium (COLACE) 100 MG capsule Take 100 mg by mouth 2 (two) times daily.    Marland Kitchen lactase (LACTAID) 3000 UNITS tablet Take 3,000 Units by mouth daily as needed (lactose intolerance). Reported on 05/12/2016    . lidocaine-prilocaine (EMLA) cream Apply 1 application topically as needed. Apply tablespoon to PAC 2 hours prior to stick and cover with plastic wrap 30 g 5  . mirtazapine (REMERON) 30 MG tablet TAKE 1 TABLET BY MOUTH AT BEDTIME. 30 tablet 0  . morphine (MS CONTIN) 60 MG 12 hr tablet Take 1 tablet (60 mg total) by mouth every 12 (twelve) hours. 60 tablet 0  . Morphine Sulfate (MORPHINE CONCENTRATE) 10 mg / 0.5 ml concentrated solution Take 1-1.5 mLs (20-30 mg total) by mouth every 3 (three) hours as needed for severe pain. 60 mL 0  . polyethylene glycol (MIRALAX /  GLYCOLAX) packet Take 17 g by mouth every other day.     . prochlorperazine (COMPAZINE) 10 MG tablet Take 1 tablet (10 mg total) by mouth every 6 (six) hours as needed for nausea or vomiting. 30 tablet 0  . zolpidem (AMBIEN) 10 MG tablet TAKE 1 TABLET BY MOUTH AT BEDTIME AS NEEDED FOR SLEEP 30 tablet 0   No current facility-administered medications for this visit.    Allergies  Allergen Reactions  . Lactose Intolerance (Gi) Other (See Comments)    Upset stomach      Review of Systems: Positive for, for appetite, nausea. All systems reviewed and negative except where noted in HPI.    Physical Exam: BP (!) 98/56   Pulse (!) 119   Ht 6' (1.829 m)   Wt 159 lb 2 oz (72.2 kg)   BMI 21.58 kg/m  Constitutional:  Thin white male in no acute distress. Psychiatric: Emotional. Pleasant. Marland Kitchen EENT: Conjunctivae are normal. No scleral icterus. Neck supple. Trachestomy Cardiovascular: Normal rate, regular rhythm.  Pulmonary/chest: Effort normal  and breath sounds normal. No wheezing, rales or rhonchi. Abdominal: Soft, nondistended, mild diffuse lower abdominal tenderness. . Bowel sounds active throughout. There are no masses palpable. No hepatomegaly. Extremities: no edema Neurological: Alert and oriented to person place and time. Skin: Skin is warm and dry. No rashes noted.   ASSESSMENT AND PLAN:  1. 51 yo male with metastatic esophageal adenocarcinoma. Was on salvage chemo but that is on hold. Oncology has recommended Hospice. Here with recurrent dysphagia. Patient has a history of anastomotic strictures requiring balloon dilation, last one approximately 10 months ago.  Patient is already scheduled to have an EGD with dilation at the hospital with Dr. Henrene Pastor. The risks and benefits of EGD were discussed and the patient agrees to proceed. Advised him in the interim to stay on a soft diet to avoid food impaction  2. Constipation. This has been an ongoing issue. I believe constipation likely from  narcotics. He has now started taking the miralax daily and had large BM yesterday. I advised him to continue daily miralax, increase to twice daily if needed. If constipation remains a problem he will call back for further recommendations.   Tye Savoy, NP  03/22/2017, 12:02 PM

## 2017-03-22 NOTE — Patient Instructions (Signed)
If you are age 53 or older, your body mass index should be between 23-30. Your Body mass index is 21.58 kg/m. If this is out of the aforementioned range listed, please consider follow up with your Primary Care Provider.  If you are age 14 or younger, your body mass index should be between 19-25. Your Body mass index is 21.58 kg/m. If this is out of the aformentioned range listed, please consider follow up with your Primary Care Provider.   You have been scheduled for an endoscopy. Please follow written instructions given to you at your visit today. If you use inhalers (even only as needed), please bring them with you on the day of your procedure. Your physician has requested that you go to www.startemmi.com and enter the access code given to you at your visit today. This web site gives a general overview about your procedure. However, you should still follow specific instructions given to you by our office regarding your preparation for the procedure.  INCREASE Miralax to 1- 2 times daily.  Thank you for choosing me and [facility].  Tye Savoy, NP

## 2017-03-23 ENCOUNTER — Telehealth: Payer: Self-pay | Admitting: Oncology

## 2017-03-23 ENCOUNTER — Telehealth: Payer: Self-pay | Admitting: *Deleted

## 2017-03-23 DIAGNOSIS — C153 Malignant neoplasm of upper third of esophagus: Secondary | ICD-10-CM

## 2017-03-23 MED ORDER — ALPRAZOLAM 0.5 MG PO TABS: ORAL_TABLET | ORAL | 0 refills | Status: AC

## 2017-03-23 MED ORDER — CYCLOBENZAPRINE HCL 10 MG PO TABS: 10.0000 mg | ORAL_TABLET | Freq: Three times a day (TID) | ORAL | 0 refills | Status: AC | PRN

## 2017-03-23 MED ORDER — MORPHINE SULFATE (CONCENTRATE) 10 MG /0.5 ML PO SOLN: 20.0000 mg | ORAL | 0 refills | Status: DC | PRN

## 2017-03-23 MED FILL — MORPHINE SULF 100 MG/5 ML S: 100 | 5 days supply | Qty: 60 | Fill #0

## 2017-03-23 MED FILL — CYCLOBENZAPRINE 10 MG TAB: 10 | 30 days supply | Qty: 90 | Fill #0

## 2017-03-23 MED FILL — ALPRAZolam 0.5 MG TABS: 0.5 | 20 days supply | Qty: 60 | Fill #0

## 2017-03-23 NOTE — Telephone Encounter (Signed)
Message from [place], [facility] RN requesting refills of Xanax, Flexeril and Roxanol. Refills faxed to [facility].

## 2017-03-23 NOTE — Telephone Encounter (Signed)
S/w pt, gave appt for 4/17 @ 8am.

## 2017-03-27 ENCOUNTER — Inpatient Hospital Stay
Admission: RE | Admit: 2017-03-27 | Discharge: 2017-03-27 | Disposition: A | Discharge status: Home / Self Care | Payer: Self-pay | Source: Ambulatory Visit | Attending: Radiation Oncology | Admitting: Radiation Oncology

## 2017-03-27 ENCOUNTER — Ambulatory Visit: Payer: BLUE CROSS/BLUE SHIELD | Admitting: Oncology

## 2017-03-27 ENCOUNTER — Encounter: Payer: Self-pay | Admitting: *Deleted

## 2017-03-27 NOTE — Progress Notes (Signed)
Agree with assessment and plans 

## 2017-03-27 NOTE — Progress Notes (Signed)
0900 Unable to keep anymore appointment unable to physically come to his appointments per wife.

## 2017-03-30 ENCOUNTER — Telehealth: Payer: Self-pay | Admitting: *Deleted

## 2017-03-30 NOTE — Telephone Encounter (Signed)
Call placed back to [place] with [facility].  She states that she has not seen patient yet today, but is going to see patient this afternoon, will collect urine sample and have results sent to [facility].  Dr. Benay Spice notified.

## 2017-03-30 NOTE — Telephone Encounter (Signed)
Received call from [place]/Hospice RN/[place] informing that pt is very sick & wife reports that pt is having pain with urination with blood tinged urine.  Return call is [phone].  Message routed to Dr Sherrill/Pod RN

## 2017-04-04 ENCOUNTER — Telehealth: Payer: Self-pay | Admitting: *Deleted

## 2017-04-04 ENCOUNTER — Other Ambulatory Visit: Payer: Self-pay | Admitting: *Deleted

## 2017-04-04 DIAGNOSIS — R05 Cough: Secondary | ICD-10-CM

## 2017-04-04 DIAGNOSIS — R059 Cough, unspecified: Secondary | ICD-10-CM

## 2017-04-04 MED ORDER — HYDROCOD POLST-CPM POLST ER 10-8 MG/5ML PO SUER: 5.0000 mL | Freq: Two times a day (BID) | ORAL | 0 refills | Status: AC | PRN

## 2017-04-04 NOTE — Telephone Encounter (Signed)
TCT Ria Comment, RN with [facility] after receiving request for cough medication for patient. Advised her that prescription for Tussionex is being fax'd to [facility] on [address] . Ria Comment to inform pt's wife.

## 2017-04-10 ENCOUNTER — Ambulatory Visit: Payer: BLUE CROSS/BLUE SHIELD | Admitting: Internal Medicine

## 2017-04-10 MED FILL — BENZONATATE 100 MG CAP: 100 | 30 days supply | Qty: 90 | Fill #1

## 2017-04-11 ENCOUNTER — Telehealth: Payer: Self-pay | Admitting: *Deleted

## 2017-04-11 ENCOUNTER — Other Ambulatory Visit: Payer: Self-pay | Admitting: *Deleted

## 2017-04-11 MED ORDER — MORPHINE SULFATE (CONCENTRATE) 10 MG /0.5 ML PO SOLN: 20.0000 mg | ORAL | 0 refills | Status: AC | PRN

## 2017-04-11 MED ORDER — MORPHINE SULFATE ER 60 MG PO TBCR: 60.0000 mg | EXTENDED_RELEASE_TABLET | Freq: Two times a day (BID) | ORAL | 0 refills | Status: AC

## 2017-04-11 MED FILL — MORPHINE SULF 60 MG TAB SA: 60 | 30 days supply | Qty: 60 | Fill #0

## 2017-04-11 MED FILL — MORPHINE SULF 100 MG/5 ML S: 100 | 5 days supply | Qty: 60 | Fill #0

## 2017-04-11 NOTE — Telephone Encounter (Signed)
Message received from [place] with [facility] requesting a refill for MS Contin and Roxanol for patient to be faxed to [facility] outpatient pharmacy. Prescriptions filled and faxed as requested.

## 2017-04-13 ENCOUNTER — Telehealth: Payer: Self-pay | Admitting: Internal Medicine

## 2017-04-13 NOTE — Telephone Encounter (Signed)
Pt states he just feels so sick he wants to cancel the procedure for Monday, does not want to reschedule the appointment. Appt cancelled per pt request.

## 2017-04-16 ENCOUNTER — Encounter (HOSPITAL_COMMUNITY): Admission: RE | Payer: Self-pay | Source: Ambulatory Visit

## 2017-04-16 ENCOUNTER — Ambulatory Visit (HOSPITAL_COMMUNITY)
Admission: RE | Admit: 2017-04-16 | Payer: BLUE CROSS/BLUE SHIELD | Source: Ambulatory Visit | Admitting: Internal Medicine

## 2017-04-16 SURGERY — EGD (ESOPHAGOGASTRODUODENOSCOPY)
Anesthesia: Monitor Anesthesia Care

## 2017-04-25 ENCOUNTER — Telehealth: Payer: Self-pay | Admitting: Oncology

## 2017-04-25 NOTE — Telephone Encounter (Signed)
Bonnie from hospice called to let us know that Joel Osborne passed away on 05-11-2017 at 4:46pm at home

## 2017-05-02 ENCOUNTER — Telehealth: Payer: Self-pay | Admitting: *Deleted

## 2017-05-02 NOTE — Telephone Encounter (Signed)
"  [place] [facility] [place] [phone] calling to check on the status of the death certificate left on [phone]."  Message left ext [phone].  [facility]ative confirmed with provider certificate has been signed.  [employer] representative ext [phone] reports certificate is ready for pick up.  Notified funeral home.

## 2017-05-11 DEATH — deceased

## 2017-06-12 IMAGING — CR DG CHEST 2V
2 series · 2 of 2 positions shown · non-contrast
Comparison: Prior radiograph from 12/08/2016.

CLINICAL DATA: Initial evaluation for acute difficulty breathing.

EXAM:
CHEST  2 VIEW

[w chest lat]
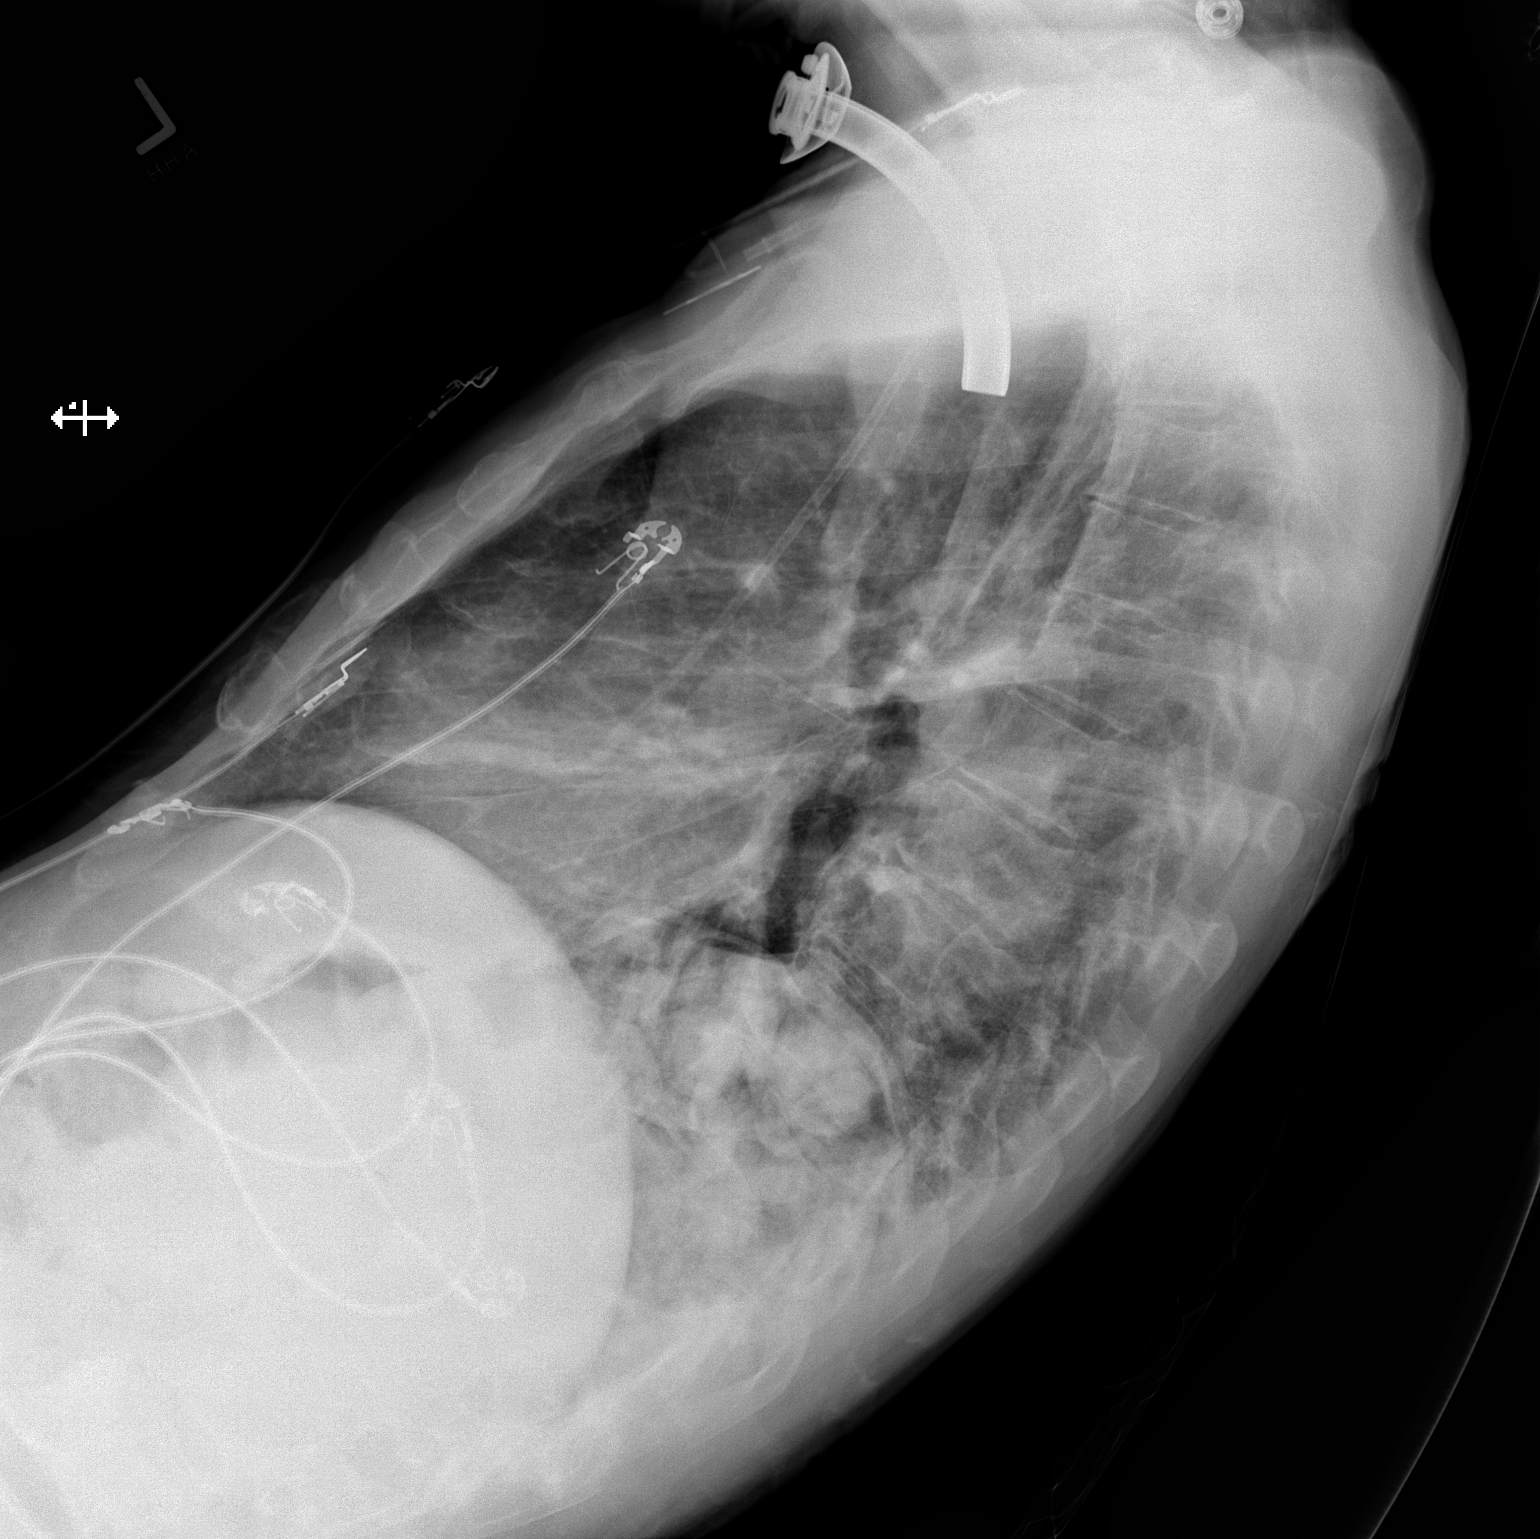

[x chest ap]
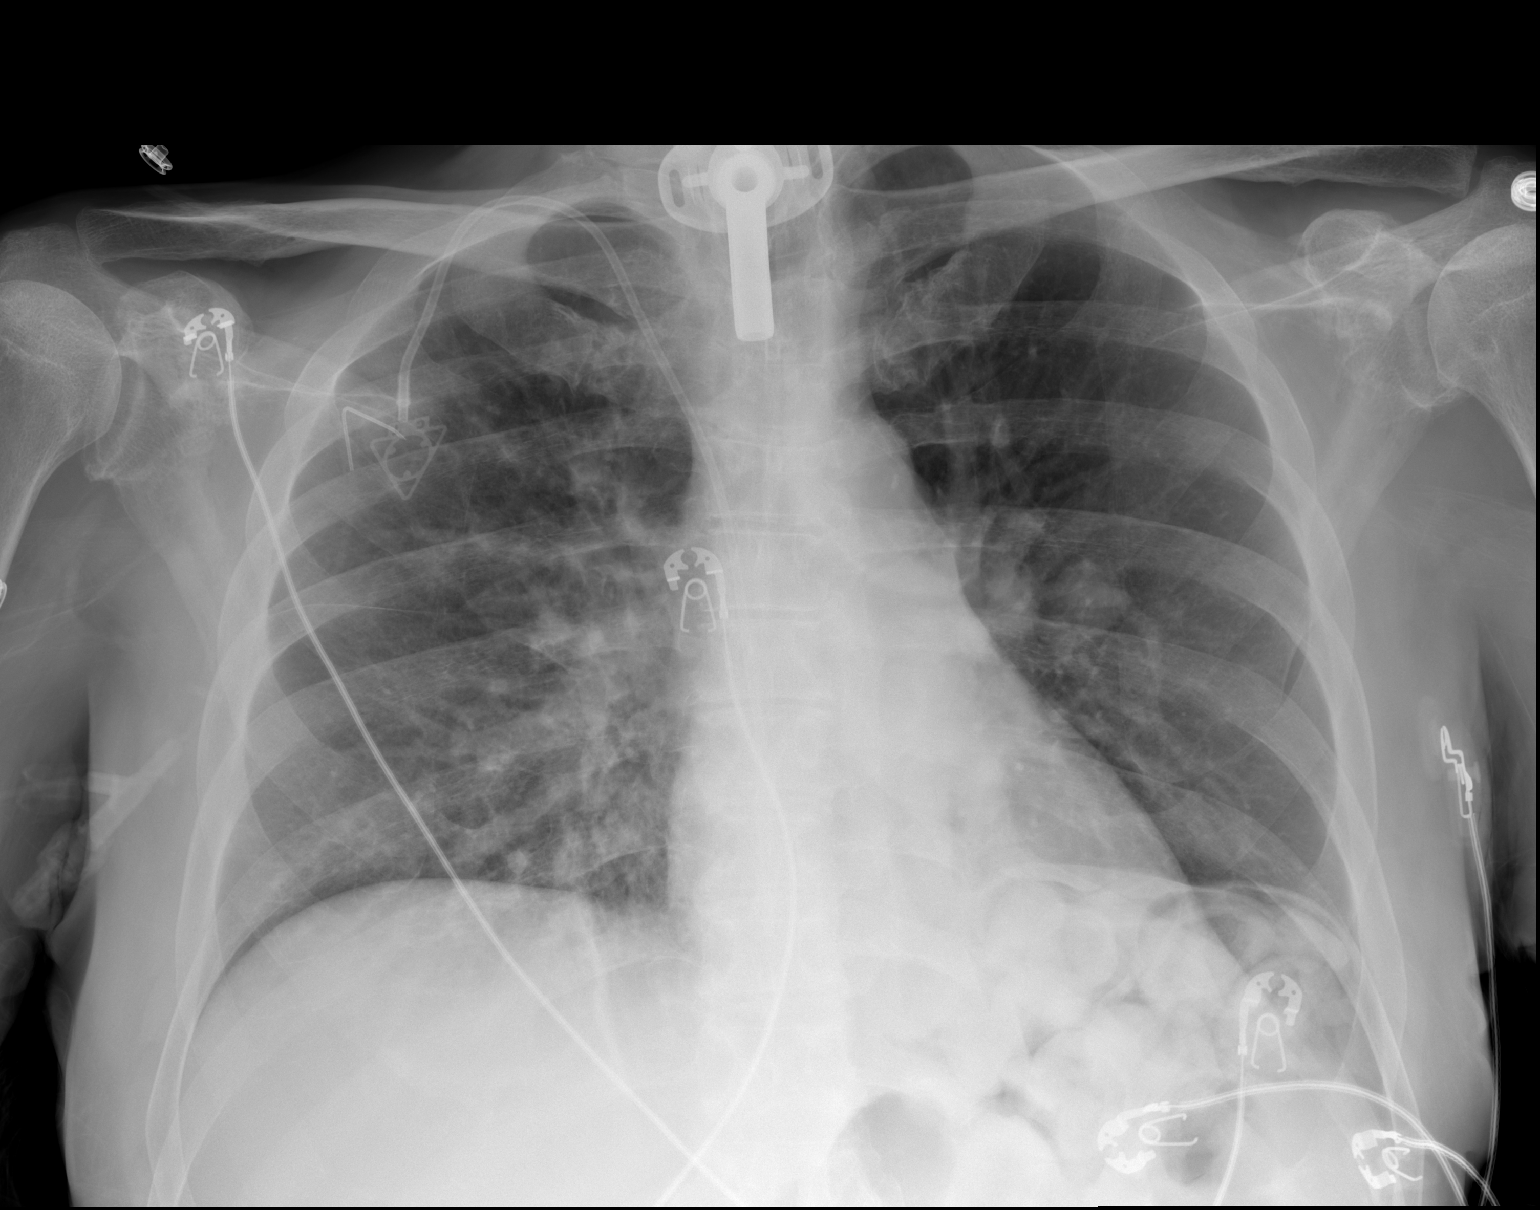

[2 of 2 positions shown; findings below may reference images not displayed]

FINDINGS: Tracheostomy tube appears appropriately positioned over the upper
airway. Cardiac and mediastinal silhouettes within normal limits.
Right-sided Port-A-Cath in place.

Lungs mildly hypoinflated. Nodular densities within the left
perihilar region and left lung base, suspected to reflect patient's
known metastatic disease. Superimposed left basilar atelectatic
changes and/or consolidation. Mild perihilar vascular congestion
without pulmonary edema. No other focal infiltrates. No
pneumothorax.

No acute osseous abnormality.
IMPRESSION: 1. Nodular densities within the left perihilar region and medial
left lung base, likely related to known metastatic disease.
Superimposed atelectasis and/or consolidation at the left lung base.
2. Mild perihilar vascular congestion without overt pulmonary edema.
3. Tracheostomy well positioned over the upper airway.

## 2017-06-16 ENCOUNTER — Other Ambulatory Visit: Payer: Self-pay | Admitting: Nurse Practitioner
